# Patient Record
Sex: Male | Born: 1945 | Race: Black or African American | Hispanic: No | Smoking: Former smoker
Health system: Southern US, Community
[De-identification: ages and names within clinical notes are randomized; demographics above are authoritative.]

## PROBLEM LIST (undated history)

## (undated) DIAGNOSIS — J189 Pneumonia, unspecified organism: Secondary | ICD-10-CM

## (undated) DIAGNOSIS — C3491 Malignant neoplasm of unspecified part of right bronchus or lung: Secondary | ICD-10-CM

## (undated) DIAGNOSIS — E785 Hyperlipidemia, unspecified: Secondary | ICD-10-CM

## (undated) DIAGNOSIS — I340 Nonrheumatic mitral (valve) insufficiency: Secondary | ICD-10-CM

## (undated) DIAGNOSIS — H409 Unspecified glaucoma: Secondary | ICD-10-CM

## (undated) DIAGNOSIS — R918 Other nonspecific abnormal finding of lung field: Secondary | ICD-10-CM

## (undated) DIAGNOSIS — Z923 Personal history of irradiation: Secondary | ICD-10-CM

## (undated) DIAGNOSIS — E538 Deficiency of other specified B group vitamins: Secondary | ICD-10-CM

## (undated) DIAGNOSIS — F1010 Alcohol abuse, uncomplicated: Secondary | ICD-10-CM

## (undated) DIAGNOSIS — I739 Peripheral vascular disease, unspecified: Secondary | ICD-10-CM

## (undated) DIAGNOSIS — I2510 Atherosclerotic heart disease of native coronary artery without angina pectoris: Secondary | ICD-10-CM

## (undated) DIAGNOSIS — J449 Chronic obstructive pulmonary disease, unspecified: Secondary | ICD-10-CM

## (undated) DIAGNOSIS — K7460 Unspecified cirrhosis of liver: Secondary | ICD-10-CM

## (undated) DIAGNOSIS — Z87442 Personal history of urinary calculi: Secondary | ICD-10-CM

## (undated) DIAGNOSIS — I10 Essential (primary) hypertension: Secondary | ICD-10-CM

## (undated) DIAGNOSIS — I700 Atherosclerosis of aorta: Secondary | ICD-10-CM

## (undated) DIAGNOSIS — C801 Malignant (primary) neoplasm, unspecified: Secondary | ICD-10-CM

## (undated) DIAGNOSIS — I5189 Other ill-defined heart diseases: Secondary | ICD-10-CM

## (undated) DIAGNOSIS — K219 Gastro-esophageal reflux disease without esophagitis: Secondary | ICD-10-CM

## (undated) HISTORY — DX: Essential (primary) hypertension: I10

## (undated) HISTORY — PX: COLONOSCOPY WITH PROPOFOL: SHX5780

## (undated) HISTORY — DX: Other nonspecific abnormal finding of lung field: R91.8

## (undated) HISTORY — PX: LEG AMPUTATION ABOVE KNEE: SHX117

## (undated) HISTORY — DX: Personal history of urinary calculi: Z87.442

---

## 2005-07-27 ENCOUNTER — Ambulatory Visit: Payer: Self-pay | Admitting: Gastroenterology

## 2010-01-16 ENCOUNTER — Ambulatory Visit: Payer: Self-pay | Admitting: Gastroenterology

## 2010-01-17 LAB — PATHOLOGY REPORT

## 2012-06-26 ENCOUNTER — Ambulatory Visit: Payer: Self-pay | Admitting: Gastroenterology

## 2012-06-26 LAB — CBC WITH DIFFERENTIAL/PLATELET
Basophil #: 0.1 10*3/uL (ref 0.0–0.1)
Basophil %: 1.7 %
HCT: 46 % (ref 40.0–52.0)
HGB: 15.6 g/dL (ref 13.0–18.0)
Lymphocyte #: 1.6 10*3/uL (ref 1.0–3.6)
Lymphocyte %: 27.8 %
MCH: 31.7 pg (ref 26.0–34.0)
MCHC: 34 g/dL (ref 32.0–36.0)
MCV: 93 fL (ref 80–100)
Monocyte %: 8 %
Neutrophil #: 3.3 10*3/uL (ref 1.4–6.5)
Neutrophil %: 56.7 %
Platelet: 165 10*3/uL (ref 150–440)
RBC: 4.94 10*6/uL (ref 4.40–5.90)
RDW: 13.8 % (ref 11.5–14.5)
WBC: 5.7 10*3/uL (ref 3.8–10.6)

## 2012-06-26 LAB — PROTIME-INR: INR: 1

## 2012-06-27 LAB — PATHOLOGY REPORT

## 2012-08-04 ENCOUNTER — Ambulatory Visit: Payer: Self-pay | Admitting: Orthopedic Surgery

## 2012-12-26 LAB — COMPREHENSIVE METABOLIC PANEL
Anion Gap: 5 — ABNORMAL LOW (ref 7–16)
BUN: 13 mg/dL (ref 7–18)
Bilirubin,Total: 0.5 mg/dL (ref 0.2–1.0)
Calcium, Total: 8.9 mg/dL (ref 8.5–10.1)
Co2: 27 mmol/L (ref 21–32)
EGFR (African American): >60
Glucose: 111 mg/dL — ABNORMAL HIGH (ref 65–99)
Osmolality: 271 (ref 275–301)
SGOT(AST): 33 U/L (ref 15–37)
Sodium: 135 mmol/L — ABNORMAL LOW (ref 136–145)
Total Protein: 7.5 g/dL (ref 6.4–8.2)

## 2012-12-26 LAB — URINALYSIS, COMPLETE
Glucose,UR: NEGATIVE mg/dL (ref 0–75)
Hyaline Cast: 3
Leukocyte Esterase: NEGATIVE
Nitrite: NEGATIVE
Ph: 6 (ref 4.5–8.0)
Protein: 30
RBC,UR: 1 /HPF (ref 0–5)
Specific Gravity: 1.004 (ref 1.003–1.030)
Squamous Epithelial: NONE SEEN

## 2012-12-26 LAB — DRUG SCREEN, URINE
Amphetamines, Ur Screen: NEGATIVE (ref ?–1000)
Benzodiazepine, Ur Scrn: NEGATIVE (ref ?–200)
Cannabinoid 50 Ng, Ur ~~LOC~~: NEGATIVE (ref ?–50)
Cocaine Metabolite,Ur ~~LOC~~: NEGATIVE (ref ?–300)
Methadone, Ur Screen: NEGATIVE (ref ?–300)
Opiate, Ur Screen: NEGATIVE (ref ?–300)
Phencyclidine (PCP) Ur S: NEGATIVE (ref ?–25)

## 2012-12-26 LAB — CBC
HCT: 44.7 % (ref 40.0–52.0)
HGB: 15.2 g/dL (ref 13.0–18.0)
MCH: 31.7 pg (ref 26.0–34.0)
MCHC: 34 g/dL (ref 32.0–36.0)
Platelet: 146 10*3/uL — ABNORMAL LOW (ref 150–440)
WBC: 9.4 10*3/uL (ref 3.8–10.6)

## 2012-12-26 LAB — ETHANOL: Ethanol %: 0.276 % — ABNORMAL HIGH (ref 0.000–0.080)

## 2012-12-26 LAB — PROTIME-INR: INR: 0.9

## 2012-12-27 ENCOUNTER — Inpatient Hospital Stay: Payer: Self-pay | Admitting: Psychiatry

## 2012-12-28 LAB — BEHAVIORAL MEDICINE 1 PANEL
Albumin: 3.1 g/dL — ABNORMAL LOW (ref 3.4–5.0)
Alkaline Phosphatase: 91 U/L (ref 50–136)
Anion Gap: 5 — ABNORMAL LOW (ref 7–16)
Basophil #: 0.1 10*3/uL (ref 0.0–0.1)
Bilirubin,Total: 1 mg/dL (ref 0.2–1.0)
Calcium, Total: 8.8 mg/dL (ref 8.5–10.1)
Chloride: 105 mmol/L (ref 98–107)
Creatinine: 1.09 mg/dL (ref 0.60–1.30)
EGFR (Non-African Amer.): >60
Eosinophil #: 0.1 10*3/uL (ref 0.0–0.7)
HGB: 14.4 g/dL (ref 13.0–18.0)
Lymphocyte #: 1 10*3/uL (ref 1.0–3.6)
MCHC: 34.4 g/dL (ref 32.0–36.0)
MCV: 92 fL (ref 80–100)
Monocyte #: 1 x10 3/mm (ref 0.2–1.0)
Osmolality: 272 (ref 275–301)
Potassium: 3.5 mmol/L (ref 3.5–5.1)
RBC: 4.54 10*6/uL (ref 4.40–5.90)
SGOT(AST): 32 U/L (ref 15–37)
WBC: 15.2 10*3/uL — ABNORMAL HIGH (ref 3.8–10.6)

## 2012-12-31 ENCOUNTER — Ambulatory Visit: Payer: Self-pay | Admitting: Psychiatry

## 2013-01-01 ENCOUNTER — Emergency Department: Payer: Self-pay | Admitting: Emergency Medicine

## 2013-01-03 ENCOUNTER — Emergency Department: Payer: Self-pay | Admitting: Emergency Medicine

## 2013-01-05 ENCOUNTER — Emergency Department: Payer: Self-pay | Admitting: Emergency Medicine

## 2013-09-09 DIAGNOSIS — I10 Essential (primary) hypertension: Secondary | ICD-10-CM | POA: Insufficient documentation

## 2014-07-23 NOTE — Consult Note (Signed)
Consult: treatment recommendations Patient was seen as requested by Clinical Education officer, museum. He was seen prior to his discharge and expressed desire for treatment in the [facility] CD-IOP at this inpatient discharge. Intake / admission interview scheduled for 1:PM December 31, 2012. He believes that he could benefit from CD-IOP. Plans are to follow CD-IOP at this inpatient discharge. Oriented with he and his wife as he was leaving this inpatient treatment.   Electronic Signatures: Laqueta Due (PsyD)  (Signed on 29-Sep-14 17:19)  Authored  Last Updated: 29-Sep-14 17:19 by Laqueta Due (PsyD)

## 2014-07-23 NOTE — H&P (Signed)
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  The patient is married for a second time for more than 30 years and lives with his wife, who is 28 years old.  The patient comes for inpatient psychiatry at [facility] with a chief complaint, "I hit my head and I was drunk and I fell down and my wife drove me here to get help."    HISTORY OF PRESENT ILLNESS:  The patient reports that he drinks at a rate of 6 to 7 shots per day and has been drinking at this rate for quite some time, for a long time.    PAST PSYCHIATRIC HISTORY:  No previous history of inpatient psychiatry, no history of suicide attempts, not being followed by any psychiatrist.     F.H  None known for MI . No known H/O for suicides in the family.    FAMILY HISTORY:  Raised by parents who are both deceased.  Father farmed and mother did handy jobs and farmed.  Has 9 siblings, close to family.    PERSONAL HISTORY:  The patient is a poor historian.  He reports that he dropped out in 11th grade had been born around [place].  Longest job he has worked was he did odd jobs and base jobs, currently employed and did wiring for many years.    MILITARY HISTORY:  None.  MARRIAGES:  Is married twice.  This is the second marriage.  Has 2 children.  He did not say if he was close to the children.    ALCOHOL AND DRUGS:  Has been drinking alcohol for many years.  Had 2 DWIs, did not lose his license.  He still has his driver's license.  Never arrested for public drunkenness.  Denies street or prescription drug abuse.  Does admit smoking nicotine cigarettes as much as he can get.    MEDICAL HISTORY:  Has hypertension.  No known history of diabetes mellitus.  No major surgeries, no major injuries.  No history of motor vehicle accidents and admits that  he does not have any regular doctor.    PHYSICAL EXAMINATION VITAL SIGNS:  Temperature 98.5, pulse 82 per minute regular, respirations 18 per minute regular, blood pressure is 140/77 mmHg. HEENT:  Head is normocephalic, atraumatic.  Mouth edentulous and lost many teeth.   NECK:  Supple.   CHEST:  Normal expansion, normal breath sounds heard. HEART:  Normal S1 and S2 heard.   ABDOMEN:  Soft, no organomegaly, bowel sounds heard. RECTAL:  Deferred.   NEUROLOGICAL:  Normal gait.  Cranial nerves II through XII grossly intact.    MENTAL STATUS EXAMINATION:  The patient is dressed in street clothes, disheveled with very poor grooming and dirty cloths without stains.  He appears to be drowsy, coming out of alcohol; however, he was cheerful and smiling with fairly okay eye contact.  He knew he was in a hospital.  He did not know capital of the state.  He did not know capital of this country and he said he did not keep up with all those things.  He knew the name of the president with prompting and help.  Denies feeling depressed, denies feeling hopeless or helpless.  Denies feeling worthless or useless.  Memory and recall are poor.  Attention and  focus are poor.  He was sleepy, however, he could wake up during the interview.  He realizes that he drinks alcohol and he has been drinking for quite some time.  Cognition is very limited and he knew he was in a hospital and he said this was October 2014 which was not correct.  No psychosis.  Denies auditory or visual hallucinations.  Denies any delusional or paranoid thinking.  Denies any ideas or plans to hurt himself or others.  Insight and judgment are guarded.  IMPRESSION AXIS I:  Alcohol dependence, chronic, continuous for many years.  Substance abuse, mood disorder, nicotine dependence. AXIS II:  Deferred.   AXIS III:  Edentulous, hypertension.   AXIS IV:  Severe.  Long history of alcohol dependence with very poor insight into his drinking problem. AXIS V:   GAF 25.    PLAN:  The patient is admitted to [facility] for close observation and manage.  Will start on CIWA protocol.  He will be started on antidepressants such as Effexor at 37.5 which will be adjusted by the floor physician.  During the stay in the hospital, he will be given milieu therapy and supportive counseling and he will be addressed about his substance abuse problems with which he has very limited insight.  At the time of discharge, he will be stabilized and appropriate followup appointment made in the community.    ____________________________ Wallace Cullens. Franchot Mimes, MD skc:cs D: 12/28/2012 15:08:44 ET T: 12/28/2012 15:29:11 ET JOB#: [id number]  cc: Arlyn Leak K. Franchot Mimes, MD, <Dictator> Dewain Penning MD ELECTRONICALLY SIGNED 12/29/2012 9:51

## 2016-07-24 ENCOUNTER — Other Ambulatory Visit: Payer: Self-pay | Admitting: Gastroenterology

## 2016-07-24 DIAGNOSIS — F1029 Alcohol dependence with unspecified alcohol-induced disorder: Secondary | ICD-10-CM

## 2016-07-24 DIAGNOSIS — D696 Thrombocytopenia, unspecified: Secondary | ICD-10-CM

## 2016-08-07 ENCOUNTER — Ambulatory Visit
Admission: RE | Admit: 2016-08-07 | Discharge: 2016-08-07 | Disposition: A | Discharge status: Home / Self Care | Payer: Medicare Other | Source: Ambulatory Visit | Attending: Gastroenterology | Admitting: Gastroenterology

## 2016-08-07 DIAGNOSIS — F1029 Alcohol dependence with unspecified alcohol-induced disorder: Secondary | ICD-10-CM | POA: Diagnosis not present

## 2016-08-07 DIAGNOSIS — D696 Thrombocytopenia, unspecified: Secondary | ICD-10-CM | POA: Diagnosis present

## 2016-08-07 DIAGNOSIS — K828 Other specified diseases of gallbladder: Secondary | ICD-10-CM | POA: Insufficient documentation

## 2016-08-09 ENCOUNTER — Other Ambulatory Visit: Payer: Self-pay | Admitting: Gastroenterology

## 2016-08-09 DIAGNOSIS — R935 Abnormal findings on diagnostic imaging of other abdominal regions, including retroperitoneum: Secondary | ICD-10-CM

## 2016-08-09 DIAGNOSIS — D696 Thrombocytopenia, unspecified: Secondary | ICD-10-CM

## 2016-08-21 ENCOUNTER — Ambulatory Visit
Admission: RE | Admit: 2016-08-21 | Discharge: 2016-08-21 | Disposition: A | Discharge status: Home / Self Care | Payer: Medicare Other | Source: Ambulatory Visit | Attending: Gastroenterology | Admitting: Gastroenterology

## 2016-08-21 ENCOUNTER — Other Ambulatory Visit: Payer: Self-pay | Admitting: Gastroenterology

## 2016-08-21 DIAGNOSIS — K7689 Other specified diseases of liver: Secondary | ICD-10-CM | POA: Diagnosis not present

## 2016-08-21 DIAGNOSIS — R935 Abnormal findings on diagnostic imaging of other abdominal regions, including retroperitoneum: Secondary | ICD-10-CM

## 2016-08-21 DIAGNOSIS — N281 Cyst of kidney, acquired: Secondary | ICD-10-CM | POA: Diagnosis not present

## 2016-08-21 DIAGNOSIS — I77811 Abdominal aortic ectasia: Secondary | ICD-10-CM | POA: Insufficient documentation

## 2016-08-21 DIAGNOSIS — D696 Thrombocytopenia, unspecified: Secondary | ICD-10-CM | POA: Diagnosis not present

## 2016-08-21 DIAGNOSIS — I700 Atherosclerosis of aorta: Secondary | ICD-10-CM | POA: Insufficient documentation

## 2016-08-21 DIAGNOSIS — N289 Disorder of kidney and ureter, unspecified: Secondary | ICD-10-CM | POA: Diagnosis not present

## 2016-08-21 MED ORDER — GADOBENATE DIMEGLUMINE 529 MG/ML IV SOLN
15.0000 mL | Freq: Once | INTRAVENOUS | Status: AC | PRN
  Administered 2016-08-21: 15 mL via INTRAVENOUS

## 2016-12-10 ENCOUNTER — Encounter: Admission: RE | Payer: Self-pay | Source: Ambulatory Visit

## 2016-12-10 ENCOUNTER — Ambulatory Visit: Admission: RE | Admit: 2016-12-10 | Payer: Medicare Other | Source: Ambulatory Visit | Admitting: Gastroenterology

## 2016-12-10 SURGERY — COLONOSCOPY WITH PROPOFOL
Anesthesia: General

## 2017-04-02 DIAGNOSIS — C3491 Malignant neoplasm of unspecified part of right bronchus or lung: Secondary | ICD-10-CM

## 2017-04-02 HISTORY — DX: Malignant neoplasm of unspecified part of right bronchus or lung: C34.91

## 2017-08-14 DIAGNOSIS — Z720 Tobacco use: Secondary | ICD-10-CM | POA: Insufficient documentation

## 2017-08-15 ENCOUNTER — Telehealth: Payer: Self-pay | Admitting: *Deleted

## 2017-08-15 DIAGNOSIS — Z87891 Personal history of nicotine dependence: Secondary | ICD-10-CM

## 2017-08-15 DIAGNOSIS — Z122 Encounter for screening for malignant neoplasm of respiratory organs: Secondary | ICD-10-CM

## 2017-08-15 NOTE — Telephone Encounter (Signed)
Received referral for initial lung cancer screening scan. Contacted patient and obtained smoking history,(current, 54 pack year) as well as answering questions related to screening process. Patient denies signs of lung cancer such as weight loss or hemoptysis. Patient denies comorbidity that would prevent curative treatment if lung cancer were found. Patient is scheduled for shared decision making visit and CT scan on 09/03/17.

## 2017-08-15 NOTE — Telephone Encounter (Signed)
Received referral for low dose lung cancer screening CT scan. Message left at phone number listed in EMR for patient to call me back to facilitate scheduling scan.  

## 2017-09-02 ENCOUNTER — Encounter: Payer: Self-pay | Admitting: Nurse Practitioner

## 2017-09-03 ENCOUNTER — Inpatient Hospital Stay: Payer: Medicare Other | Attending: Nurse Practitioner | Admitting: Nurse Practitioner

## 2017-09-03 ENCOUNTER — Ambulatory Visit
Admission: RE | Admit: 2017-09-03 | Discharge: 2017-09-03 | Disposition: A | Discharge status: Home / Self Care | Payer: Medicare Other | Source: Ambulatory Visit | Attending: Nurse Practitioner | Admitting: Nurse Practitioner

## 2017-09-03 ENCOUNTER — Telehealth: Payer: Self-pay | Admitting: *Deleted

## 2017-09-03 DIAGNOSIS — Z7289 Other problems related to lifestyle: Secondary | ICD-10-CM | POA: Insufficient documentation

## 2017-09-03 DIAGNOSIS — Z7982 Long term (current) use of aspirin: Secondary | ICD-10-CM | POA: Insufficient documentation

## 2017-09-03 DIAGNOSIS — J984 Other disorders of lung: Secondary | ICD-10-CM | POA: Insufficient documentation

## 2017-09-03 DIAGNOSIS — J439 Emphysema, unspecified: Secondary | ICD-10-CM | POA: Diagnosis not present

## 2017-09-03 DIAGNOSIS — R911 Solitary pulmonary nodule: Secondary | ICD-10-CM | POA: Insufficient documentation

## 2017-09-03 DIAGNOSIS — Z79899 Other long term (current) drug therapy: Secondary | ICD-10-CM | POA: Insufficient documentation

## 2017-09-03 DIAGNOSIS — Z87891 Personal history of nicotine dependence: Secondary | ICD-10-CM

## 2017-09-03 DIAGNOSIS — F17210 Nicotine dependence, cigarettes, uncomplicated: Secondary | ICD-10-CM | POA: Insufficient documentation

## 2017-09-03 DIAGNOSIS — I700 Atherosclerosis of aorta: Secondary | ICD-10-CM | POA: Insufficient documentation

## 2017-09-03 DIAGNOSIS — Z122 Encounter for screening for malignant neoplasm of respiratory organs: Secondary | ICD-10-CM

## 2017-09-03 DIAGNOSIS — Z87442 Personal history of urinary calculi: Secondary | ICD-10-CM | POA: Insufficient documentation

## 2017-09-03 NOTE — Progress Notes (Signed)
In accordance with CMS guidelines, patient has met eligibility criteria including age, absence of signs or symptoms of lung cancer.  Social History   Tobacco Use  . Smoking status: Current Every Day Smoker    Packs/day: 1.00    Years: 54.00    Pack years: 54.00    Types: Cigarettes  Substance Use Topics  . Alcohol use: Not on file  . Drug use: Not on file      A shared decision-making session was conducted prior to the performance of CT scan. This includes one or more decision aids, includes benefits and harms of screening, follow-up diagnostic testing, over-diagnosis, false positive rate, and total radiation exposure.   Counseling on the importance of adherence to annual lung cancer LDCT screening, impact of co-morbidities, and ability or willingness to undergo diagnosis and treatment is imperative for compliance of the program.   Counseling on the importance of continued smoking cessation for former smokers; the importance of smoking cessation for current smokers, and information about tobacco cessation interventions have been given to patient including [place] and 1800 quit [place] programs.   Written order for lung cancer screening with LDCT has been given to the patient and any and all questions have been answered to the best of my abilities.    Yearly follow up will be coordinated by Burgess Estelle, Thoracic Navigator.  Beckey Rutter, DNP, AGNP-C [place] at [facility] [phone] (work cell) [phone] (office) 09/03/17 10:01 AM

## 2017-09-03 NOTE — Telephone Encounter (Signed)
Called report  IMPRESSION: 1. Spiculated multi lobular 2.4 cm right upper lobe pulmonary lesion consistent with primary bronchogenic neoplasm. Lung-RADS 4B, suspicious. Additional imaging evaluation or consultation with Pulmonology or Thoracic Surgery recommended.

## 2017-09-04 ENCOUNTER — Telehealth: Payer: Self-pay | Admitting: *Deleted

## 2017-09-04 ENCOUNTER — Encounter: Payer: Self-pay | Admitting: *Deleted

## 2017-09-04 DIAGNOSIS — R911 Solitary pulmonary nodule: Secondary | ICD-10-CM

## 2017-09-04 NOTE — Telephone Encounter (Signed)
After discussion with thoracic navigator, Beckey Rutter NP, and Dr. Reuel Boom office, patient notified of lung screening scan results including incidental findings. Discussed recommended plan for evaluation of RUL nodule including PET, PFT's, and to see medical oncology for results and planning if needed. Verbalizes understanding.

## 2017-09-06 NOTE — Progress Notes (Signed)
  Oncology Nurse Navigator Documentation  Navigator Location: [facility]-Med Onc (09/05/17 1300) Referral date to RadOnc/MedOnc: 09/03/17 (09/05/17 1300) )Navigator Encounter Type: Introductory phone call (09/05/17 1300)   Abnormal Finding Date: 09/03/17 (09/05/17 1300)                     Barriers/Navigation Needs: Coordination of Care (09/05/17 1300)   Interventions: Coordination of Care (09/05/17 1300)   Coordination of Care: Appts;Radiology (09/05/17 1300)        Acuity: Level 2 (09/05/17 1300)   Acuity Level 2: Initial guidance, education and coordination as needed;Educational needs;Assistance expediting appointments (09/05/17 1300)    phone call made to patient to review upcoming appts scheduled for next week. Pt introduced to navigator services. Informed of PFT and PET scan appts on Tues and Wed of next week. Also informed that will follow up with Dr. Grayland Ormond on Monday 6/17 to review results of both studies. Pt stated that he will try to make it to those appts. Pt encouraged to keep appts to further evaluate abnormal findings from screening CT scan. Pt informed that appts have been mailed to him as well. Pt verbalized understanding. Will make follow up phone call to patient on Monday. Nothing further needed at this time. Time Spent with Patient: 30 (09/05/17 1300)

## 2017-09-10 ENCOUNTER — Ambulatory Visit: Payer: Medicare Other | Attending: Nurse Practitioner

## 2017-09-10 DIAGNOSIS — R911 Solitary pulmonary nodule: Secondary | ICD-10-CM | POA: Diagnosis present

## 2017-09-10 DIAGNOSIS — J449 Chronic obstructive pulmonary disease, unspecified: Secondary | ICD-10-CM | POA: Diagnosis not present

## 2017-09-10 MED ORDER — ALBUTEROL SULFATE (2.5 MG/3ML) 0.083% IN NEBU
2.5000 mg | INHALATION_SOLUTION | Freq: Once | RESPIRATORY_TRACT | Status: AC
  Administered 2017-09-10: 2.5 mg via RESPIRATORY_TRACT
  Filled 2017-09-10: qty 3

## 2017-09-11 ENCOUNTER — Ambulatory Visit
Admission: RE | Admit: 2017-09-11 | Discharge: 2017-09-11 | Disposition: A | Discharge status: Home / Self Care | Payer: Medicare Other | Source: Ambulatory Visit | Attending: Nurse Practitioner | Admitting: Nurse Practitioner

## 2017-09-11 ENCOUNTER — Telehealth: Payer: Self-pay | Admitting: Nurse Practitioner

## 2017-09-11 ENCOUNTER — Other Ambulatory Visit: Payer: Self-pay | Admitting: *Deleted

## 2017-09-11 DIAGNOSIS — Z79899 Other long term (current) drug therapy: Secondary | ICD-10-CM | POA: Insufficient documentation

## 2017-09-11 DIAGNOSIS — I700 Atherosclerosis of aorta: Secondary | ICD-10-CM | POA: Diagnosis not present

## 2017-09-11 DIAGNOSIS — J984 Other disorders of lung: Secondary | ICD-10-CM | POA: Diagnosis not present

## 2017-09-11 DIAGNOSIS — R911 Solitary pulmonary nodule: Secondary | ICD-10-CM | POA: Diagnosis present

## 2017-09-11 DIAGNOSIS — I714 Abdominal aortic aneurysm, without rupture: Secondary | ICD-10-CM | POA: Insufficient documentation

## 2017-09-11 DIAGNOSIS — N200 Calculus of kidney: Secondary | ICD-10-CM | POA: Diagnosis not present

## 2017-09-11 DIAGNOSIS — N400 Enlarged prostate without lower urinary tract symptoms: Secondary | ICD-10-CM | POA: Diagnosis not present

## 2017-09-11 LAB — GLUCOSE, CAPILLARY: GLUCOSE-CAPILLARY: 94 mg/dL (ref 65–99)

## 2017-09-11 MED ORDER — FLUDEOXYGLUCOSE F - 18 (FDG) INJECTION
9.4200 | Freq: Once | INTRAVENOUS | Status: AC | PRN
Start: 2017-09-11 — End: 2017-09-11
  Administered 2017-09-11: 9.42 via INTRAVENOUS

## 2017-09-11 NOTE — Telephone Encounter (Signed)
Patient was recently seen in [facility]. Results revealed a spiculated multi-lobular 2.4 cm RUL lung nodule consistent and concerning for primary bronchogenic neoplasm. Case discussed at [facility] and consensus recommendation was to proceed with PFTs and PET scan for further evaluation to aid in treatment planning.

## 2017-09-14 LAB — BLOOD GAS, ARTERIAL
Acid-base deficit: 1.6 mmol/L (ref 0.0–2.0)
BICARBONATE: 21.7 mmol/L (ref 20.0–28.0)
FIO2: 0.21
O2 SAT: 97.2 %
PO2 ART: 89 mmHg (ref 83.0–108.0)
Patient temperature: 37
pCO2 arterial: 32 mmHg (ref 32.0–48.0)
pH, Arterial: 7.44 (ref 7.350–7.450)

## 2017-09-15 DIAGNOSIS — R911 Solitary pulmonary nodule: Secondary | ICD-10-CM | POA: Insufficient documentation

## 2017-09-15 NOTE — Progress Notes (Deleted)
[place]  [phone] [phone]  ID: Samuel Carney OB: Jan 02, 1946  MR#: [id number]  CSN#:[id number]  Patient Care Team: Juluis Pitch, MD as PCP - General (Family Medicine) Telford Nab, RN as Registered Nurse  CHIEF COMPLAINT: Right upper lobe lung nodule  INTERVAL HISTORY: ***  REVIEW OF SYSTEMS:   ROS  As per HPI. Otherwise, a complete review of systems is negative.  PAST MEDICAL HISTORY: No past medical history on file.  PAST SURGICAL HISTORY: *** The histories are not reviewed yet. Please review them in the "History" navigator section and refresh this [place].  FAMILY HISTORY: No family history on file.  ADVANCED DIRECTIVES (Y/N):  N  HEALTH MAINTENANCE: Social History   Tobacco Use  . Smoking status: Current Every Day Smoker    Packs/day: 1.00    Years: 54.00    Pack years: 54.00    Types: Cigarettes  Substance Use Topics  . Alcohol use: Not on file  . Drug use: Not on file     Colonoscopy:  PAP:  Bone density:  Lipid panel:  Not on File  No current outpatient medications on file.   No current facility-administered medications for this visit.     OBJECTIVE: There were no vitals filed for this visit.   There is no height or weight on file to calculate BMI.    ECOG FS:{CHL ONC [id number]  General: Well-developed, well-nourished, no acute distress. Eyes: Pink conjunctiva, anicteric sclera. HEENT: Normocephalic, moist mucous membranes, clear oropharnyx. Lungs: Clear to auscultation bilaterally. Heart: Regular rate and rhythm. No rubs, murmurs, or gallops. Abdomen: Soft, nontender, nondistended. No organomegaly noted, normoactive bowel sounds. Musculoskeletal: No edema, cyanosis, or clubbing. Neuro: Alert, answering all questions appropriately. Cranial nerves grossly intact. Skin: No rashes or petechiae noted. Psych: Normal affect. Lymphatics: No cervical, calvicular, axillary or inguinal  LAD.   LAB RESULTS:  Lab Results  Component Value Date   NA 136 12/28/2012   K 3.5 12/28/2012   CL 105 12/28/2012   CO2 26 12/28/2012   GLUCOSE 96 12/28/2012   BUN 14 12/28/2012   CREATININE 1.09 12/28/2012   CALCIUM 8.8 12/28/2012   PROT 6.9 12/28/2012   ALBUMIN 3.1 (L) 12/28/2012   AST 32 12/28/2012   ALT 16 12/28/2012   ALKPHOS 91 12/28/2012   BILITOT 1.0 12/28/2012   GFRNONAA >60 12/28/2012   GFRAA >60 12/28/2012    Lab Results  Component Value Date   WBC 15.2 (H) 12/28/2012   NEUTROABS 12.9 (H) 12/28/2012   HGB 14.4 12/28/2012   HCT 41.7 12/28/2012   MCV 92 12/28/2012   PLT 130 (L) 12/28/2012     STUDIES: Nm Pet Image Initial (pi) Skull Base To Thigh  Result Date: 09/11/2017 CLINICAL DATA:  Initial treatment strategy for pulmonary nodule. EXAM: NUCLEAR MEDICINE PET SKULL BASE TO THIGH TECHNIQUE: 9.4 mCi F-18 FDG was injected intravenously. Full-ring PET imaging was performed from the skull base to thigh after the radiotracer. CT data was obtained and used for attenuation correction and anatomic localization. Fasting blood glucose: 94 mg/dl COMPARISON:  Lung cancer screening CT 09/03/2017. FINDINGS: Mediastinal blood pool activity: SUV max 2.0 NECK: No hypermetabolic lymph nodes in the neck. Incidental CT findings: none CHEST: 2.4 cm lesion in the right upper lobe seen on recent screening CT is hypermetabolic with SUV max = 5.8. No evidence for hypermetabolic metastatic lymphadenopathy in the right hilum and mediastinum. No other hypermetabolic disease is identified in the chest. Incidental CT findings: There is  abdominal aortic atherosclerosis without aneurysm. Coronary artery calcification is evident. Bilateral gynecomastia, right greater than left. ABDOMEN/PELVIS: No abnormal hypermetabolic activity within the liver, pancreas, adrenal glands, or spleen. No hypermetabolic lymph nodes in the abdomen or pelvis. Incidental CT findings: Fusiform aneurysmal dilatation of the  abdominal aorta identified with maximum diameter of 3.1 cm. 7 x 10 mm nonobstructing stone evident in the lower pole the right kidney. Adjacent 4 mm right lower pole renal stone evident. Mild diverticular changes noted in the left colon. The prostate gland is enlarged. SKELETON: No focal hypermetabolic activity to suggest skeletal metastasis. Incidental CT findings: none IMPRESSION: 1. 2.4 cm right upper lobe lesion is hypermetabolic consistent with primary bronchogenic neoplasm. No evidence for hypermetabolic metastatic disease in the neck, chest, abdomen, or pelvis. 2.  Aortic Atherosclerois (ICD10-170.0) 3. Abdominal aortic aneurysm measures 3.1 cm diameter. Recommend followup by ultrasound in 3 years. This recommendation follows ACR consensus guidelines: White Paper of the ACR Incidental Findings Committee II on Vascular Findings. J Am Coll Radiol 2013; 65:790-383 4. Nonobstructing right renal stones. 5. Prostatomegaly. Electronically Signed   By: Misty Stanley M.D.   On: 09/11/2017 13:13   Ct Chest Lung Cancer Screening Low Dose Wo Contrast  Result Date: 09/03/2017 CLINICAL DATA:  72 year old male with 54 pack-year history of smoking. Lung cancer screening. EXAM: CT CHEST WITHOUT CONTRAST LOW-DOSE FOR LUNG CANCER SCREENING TECHNIQUE: Multidetector CT imaging of the chest was performed following the standard protocol without IV contrast. COMPARISON:  None. FINDINGS: Cardiovascular: The heart size is normal. No substantial pericardial effusion. Coronary artery calcification is evident. Atherosclerotic calcification is noted in the wall of the thoracic aorta. Mediastinum/Nodes: Scattered mediastinal lymph nodes are upper limits of normal for size. No evidence for gross hilar lymphadenopathy although assessment is limited by the lack of intravenous contrast on today's study. Mild circumferential wall thickening noted mid and distal esophagus. There is no axillary lymphadenopathy. Lungs/Pleura: Spiculated multi  lobular right upper lobe nodule shows tethering to the overlying pleura. Volume derived equivalent diameter of this lesion is 2.4 cm. The central tracheobronchial airways are patent. Centrilobular emphysema noted. Subtle centrilobular ground-glass nodularity with an upper lobe predominance is consistent with smoking related lung disease. Upper Abdomen: Small hypoattenuating lesions in the liver cannot be fully characterized and measure up to 10 mm. Thickening of the left adrenal gland evident. Musculoskeletal: Multilevel degenerative disc disease is evident in the thoracic spine. Bilateral gynecomastia evident. IMPRESSION: 1. Spiculated multi lobular 2.4 cm right upper lobe pulmonary lesion consistent with primary bronchogenic neoplasm. Lung-RADS 4B, suspicious. Additional imaging evaluation or consultation with Pulmonology or Thoracic Surgery recommended. 2. No definite mediastinal or hilar lymphadenopathy on this noncontrast study. 3.  Emphysema. (ICD10-J43.9) 4.  Aortic Atherosclerois (ICD10-170.0) 5. These results will be called to the ordering clinician or representative by the Radiologist Assistant, and communication documented in the PACS or zVision Dashboard. Electronically Signed   By: Misty Stanley M.D.   On: 09/03/2017 11:29    ASSESSMENT: Right upper lobe lung nodule  PLAN:    1. Right upper lobe lung nodule:  Patient expressed understanding and was in agreement with this plan. He also understands that He can call clinic at any time with any questions, concerns, or complaints.   Cancer Staging No matching staging information was found for the patient.  Lloyd Huger, MD   09/15/2017 10:49 PM

## 2017-09-16 ENCOUNTER — Inpatient Hospital Stay: Payer: Medicare Other | Admitting: Oncology

## 2017-09-18 ENCOUNTER — Other Ambulatory Visit: Payer: Self-pay

## 2017-09-18 ENCOUNTER — Inpatient Hospital Stay (HOSPITAL_BASED_OUTPATIENT_CLINIC_OR_DEPARTMENT_OTHER): Payer: Medicare Other | Admitting: Oncology

## 2017-09-18 ENCOUNTER — Encounter: Payer: Self-pay | Admitting: Oncology

## 2017-09-18 ENCOUNTER — Encounter: Payer: Self-pay | Admitting: *Deleted

## 2017-09-18 VITALS — BP 142/75 | HR 57 | Temp 97.9°F | Resp 20 | Ht 69.0 in | Wt 173.0 lb

## 2017-09-18 DIAGNOSIS — R911 Solitary pulmonary nodule: Secondary | ICD-10-CM

## 2017-09-18 DIAGNOSIS — Z87442 Personal history of urinary calculi: Secondary | ICD-10-CM | POA: Diagnosis not present

## 2017-09-18 DIAGNOSIS — Z7289 Other problems related to lifestyle: Secondary | ICD-10-CM

## 2017-09-18 DIAGNOSIS — Z7982 Long term (current) use of aspirin: Secondary | ICD-10-CM | POA: Diagnosis not present

## 2017-09-18 DIAGNOSIS — Z79899 Other long term (current) drug therapy: Secondary | ICD-10-CM | POA: Diagnosis not present

## 2017-09-18 DIAGNOSIS — F17210 Nicotine dependence, cigarettes, uncomplicated: Secondary | ICD-10-CM | POA: Diagnosis not present

## 2017-09-18 NOTE — Progress Notes (Signed)
  Oncology Nurse Navigator Documentation  Navigator Location: [facility]-Med Onc (09/18/17 1500)   )Navigator Encounter Type: Initial MedOnc (09/18/17 1500)                       Treatment Phase: Abnormal Scans (09/18/17 1500) Barriers/Navigation Needs: Coordination of Care (09/18/17 1500)   Interventions: Coordination of Care (09/18/17 1500)   Coordination of Care: Appts (09/18/17 1500)     met with patient during initial med-onc consultation with Dr. Grayland Ormond. Recent imaging and PFT's reviewed with patient by Dr. Grayland Ormond. Treatment options discussed and recommendation was for patient to discuss surgical approach with Dr. Genevive Bi then decide if wanted to pursue surgery or radiation. All questions answered at the time of visit. Referral entered and pt informed that will here from Dr. Genevive Bi office when appt has been scheduled. Contact info give and instructed to call with any further questions or needs. Pt verbalized understanding.              Time Spent with Patient: 60 (09/18/17 1500)

## 2017-09-22 NOTE — Progress Notes (Signed)
[facility] [facility]  [phone] [phone]) [phone]  ID: Samuel Carney OB: November 09, 1945  MR#: [id number]  PIR#:[id number]  Patient Care Team: Juluis Pitch, MD as PCP - General (Family Medicine) Telford Nab, RN as Registered Nurse  CHIEF COMPLAINT: Right upper lobe lung nodule.  INTERVAL HISTORY: Patient is a 72 year old male who underwent CT screening and was found to have a right upper lobe lung nodule highly suspicious for underlying malignancy.  He currently feels well and is asymptomatic.  He has no neurologic complaints.  Patient denies any recent fevers or illnesses.  He has a good appetite and denies weight loss.  He has no chest pain, cough, shortness of breath, or hemoptysis.  He denies any nausea, vomiting, constipation, or diarrhea.  He has no urinary complaints.  Patient feels at his baseline offers no specific complaints today.  REVIEW OF SYSTEMS:   Review of Systems  Constitutional: Negative.  Negative for fever, malaise/fatigue and weight loss.  Respiratory: Negative.  Negative for cough, hemoptysis and shortness of breath.   Cardiovascular: Negative.  Negative for chest pain and leg swelling.  Gastrointestinal: Negative.  Negative for abdominal pain.  Genitourinary: Negative.  Negative for dysuria.  Musculoskeletal: Negative.  Negative for back pain.  Skin: Negative.  Negative for rash.  Neurological: Negative.  Negative for sensory change, focal weakness and weakness.  Psychiatric/Behavioral: Negative.  The patient is not nervous/anxious.     As per HPI. Otherwise, a complete review of systems is negative.  PAST MEDICAL HISTORY: Past Medical History:  Diagnosis Date  . History of kidney stones   . Hypertension   . Lung mass     PAST SURGICAL HISTORY: History reviewed. No pertinent surgical history.  FAMILY HISTORY: Family History  Problem Relation Age of Onset  . Stroke Brother   . Diabetes Mellitus II Brother   . Diabetes  Mellitus II Mother   . Diabetes Mellitus II Maternal Uncle     ADVANCED DIRECTIVES (Y/N):  N  HEALTH MAINTENANCE: Social History   Tobacco Use  . Smoking status: Current Every Day Smoker    Packs/day: 1.00    Years: 50.00    Pack years: 50.00    Types: Cigarettes  . Smokeless tobacco: Never Used  Substance Use Topics  . Alcohol use: Yes    Comment: "1 pint of liquor per day"  . Drug use: Never     Colonoscopy:  PAP:  Bone density:  Lipid panel:  No Known Allergies  Current Outpatient Medications  Medication Sig Dispense Refill  . amLODipine (NORVASC) 10 MG tablet Take 1 tablet by mouth daily.    Marland Kitchen aspirin EC 81 MG tablet Take 1 tablet by mouth daily.    . dorzolamide-timolol (COSOPT) 22.3-6.8 MG/ML ophthalmic solution Place 1 drop into both eyes every 8 (eight) hours.  6  . doxazosin (CARDURA) 2 MG tablet Take 2 mg by mouth daily.  1  . metoprolol tartrate (LOPRESSOR) 100 MG tablet Take 1 tablet by mouth 2 (two) times daily.    . potassium chloride SA (KLOR-CON M20) 20 MEQ tablet Take 1 tablet by mouth daily.    Marland Kitchen telmisartan (MICARDIS) 80 MG tablet Take 1 tablet by mouth daily.    . traMADol (ULTRAM) 50 MG tablet Take 1 tablet by mouth every 4 (four) hours as needed for pain.    . Travoprost, BAK Free, (TRAVATAN Z) 0.004 % SOLN ophthalmic solution once daily.     No current facility-administered medications for this visit.  OBJECTIVE: Vitals:   09/18/17 1146  BP: (!) 142/75  Pulse: (!) 57  Resp: 20  Temp: 97.9 F (36.6 C)  SpO2: 98%     Body mass index is 25.55 kg/m.    ECOG FS:0 - Asymptomatic  General: Well-developed, well-nourished, no acute distress. Eyes: Pink conjunctiva, anicteric sclera. HEENT: Normocephalic, moist mucous membranes, clear oropharnyx. Lungs: Clear to auscultation bilaterally. Heart: Regular rate and rhythm. No rubs, murmurs, or gallops. Abdomen: Soft, nontender, nondistended. No organomegaly noted, normoactive bowel  sounds. Musculoskeletal: No edema, cyanosis, or clubbing. Neuro: Alert, answering all questions appropriately. Cranial nerves grossly intact. Skin: No rashes or petechiae noted. Psych: Normal affect. Lymphatics: No cervical, calvicular, axillary or inguinal LAD.   LAB RESULTS:  Lab Results  Component Value Date   NA 136 12/28/2012   K 3.5 12/28/2012   CL 105 12/28/2012   CO2 26 12/28/2012   GLUCOSE 96 12/28/2012   BUN 14 12/28/2012   CREATININE 1.09 12/28/2012   CALCIUM 8.8 12/28/2012   PROT 6.9 12/28/2012   ALBUMIN 3.1 (L) 12/28/2012   AST 32 12/28/2012   ALT 16 12/28/2012   ALKPHOS 91 12/28/2012   BILITOT 1.0 12/28/2012   GFRNONAA >60 12/28/2012   GFRAA >60 12/28/2012    Lab Results  Component Value Date   WBC 15.2 (H) 12/28/2012   NEUTROABS 12.9 (H) 12/28/2012   HGB 14.4 12/28/2012   HCT 41.7 12/28/2012   MCV 92 12/28/2012   PLT 130 (L) 12/28/2012     STUDIES: Nm Pet Image Initial (pi) Skull Base To Thigh  Result Date: 09/11/2017 CLINICAL DATA:  Initial treatment strategy for pulmonary nodule. EXAM: NUCLEAR MEDICINE PET SKULL BASE TO THIGH TECHNIQUE: 9.4 mCi F-18 FDG was injected intravenously. Full-ring PET imaging was performed from the skull base to thigh after the radiotracer. CT data was obtained and used for attenuation correction and anatomic localization. Fasting blood glucose: 94 mg/dl COMPARISON:  Lung cancer screening CT 09/03/2017. FINDINGS: Mediastinal blood pool activity: SUV max 2.0 NECK: No hypermetabolic lymph nodes in the neck. Incidental CT findings: none CHEST: 2.4 cm lesion in the right upper lobe seen on recent screening CT is hypermetabolic with SUV max = 5.8. No evidence for hypermetabolic metastatic lymphadenopathy in the right hilum and mediastinum. No other hypermetabolic disease is identified in the chest. Incidental CT findings: There is abdominal aortic atherosclerosis without aneurysm. Coronary artery calcification is evident. Bilateral  gynecomastia, right greater than left. ABDOMEN/PELVIS: No abnormal hypermetabolic activity within the liver, pancreas, adrenal glands, or spleen. No hypermetabolic lymph nodes in the abdomen or pelvis. Incidental CT findings: Fusiform aneurysmal dilatation of the abdominal aorta identified with maximum diameter of 3.1 cm. 7 x 10 mm nonobstructing stone evident in the lower pole the right kidney. Adjacent 4 mm right lower pole renal stone evident. Mild diverticular changes noted in the left colon. The prostate gland is enlarged. SKELETON: No focal hypermetabolic activity to suggest skeletal metastasis. Incidental CT findings: none IMPRESSION: 1. 2.4 cm right upper lobe lesion is hypermetabolic consistent with primary bronchogenic neoplasm. No evidence for hypermetabolic metastatic disease in the neck, chest, abdomen, or pelvis. 2.  Aortic Atherosclerois (ICD10-170.0) 3. Abdominal aortic aneurysm measures 3.1 cm diameter. Recommend followup by ultrasound in 3 years. This recommendation follows ACR consensus guidelines: White Paper of the ACR Incidental Findings Committee II on Vascular Findings. J Am Coll Radiol 2013; 33:545-625 4. Nonobstructing right renal stones. 5. Prostatomegaly. Electronically Signed   By: Misty Stanley M.D.   On: 09/11/2017  13:13   Ct Chest Lung Cancer Screening Low Dose Wo Contrast  Result Date: 09/03/2017 CLINICAL DATA:  72 year old male with 54 pack-year history of smoking. Lung cancer screening. EXAM: CT CHEST WITHOUT CONTRAST LOW-DOSE FOR LUNG CANCER SCREENING TECHNIQUE: Multidetector CT imaging of the chest was performed following the standard protocol without IV contrast. COMPARISON:  None. FINDINGS: Cardiovascular: The heart size is normal. No substantial pericardial effusion. Coronary artery calcification is evident. Atherosclerotic calcification is noted in the wall of the thoracic aorta. Mediastinum/Nodes: Scattered mediastinal lymph nodes are upper limits of normal for size. No  evidence for gross hilar lymphadenopathy although assessment is limited by the lack of intravenous contrast on today's study. Mild circumferential wall thickening noted mid and distal esophagus. There is no axillary lymphadenopathy. Lungs/Pleura: Spiculated multi lobular right upper lobe nodule shows tethering to the overlying pleura. Volume derived equivalent diameter of this lesion is 2.4 cm. The central tracheobronchial airways are patent. Centrilobular emphysema noted. Subtle centrilobular ground-glass nodularity with an upper lobe predominance is consistent with smoking related lung disease. Upper Abdomen: Small hypoattenuating lesions in the liver cannot be fully characterized and measure up to 10 mm. Thickening of the left adrenal gland evident. Musculoskeletal: Multilevel degenerative disc disease is evident in the thoracic spine. Bilateral gynecomastia evident. IMPRESSION: 1. Spiculated multi lobular 2.4 cm right upper lobe pulmonary lesion consistent with primary bronchogenic neoplasm. Lung-RADS 4B, suspicious. Additional imaging evaluation or consultation with Pulmonology or Thoracic Surgery recommended. 2. No definite mediastinal or hilar lymphadenopathy on this noncontrast study. 3.  Emphysema. (ICD10-J43.9) 4.  Aortic Atherosclerois (ICD10-170.0) 5. These results will be called to the ordering clinician or representative by the Radiologist Assistant, and communication documented in the PACS or zVision Dashboard. Electronically Signed   By: Misty Stanley M.D.   On: 09/03/2017 11:29    ASSESSMENT: Right upper lobe lung nodule.  PLAN:    1. Right upper lobe lung nodule: PET scan results from September 11, 2017 reviewed independently and reported as above revealing a 2.4 cm right upper lobe lesion highly suspicious for underlying malignancy. He has no other noted areas of hypermetabolism suspicious for metastatic disease.  Case was discussed at case conference and patient appears to be a surgical  candidate.  He has adequate PFTs.  Initially, patient was hesitant to pursue any type of surgery, but has agreed to meet with thoracic surgery to discuss his options.  If he declines surgery, patient will likely require biopsy along with XRT.  Further follow-up will depend on his discussion with thoracic surgery.  I spent a total of 60 minutes face-to-face with the patient of which greater than 50% of the visit was spent in counseling and coordination of care as summarized above.  Patient expressed understanding and was in agreement with this plan. He also understands that He can call clinic at any time with any questions, concerns, or complaints.   Cancer Staging No matching staging information was found for the patient.  Lloyd Huger, MD   09/22/2017 6:20 PM

## 2017-09-27 ENCOUNTER — Ambulatory Visit: Payer: Self-pay | Admitting: Cardiothoracic Surgery

## 2017-10-07 ENCOUNTER — Encounter: Payer: Self-pay | Admitting: Cardiothoracic Surgery

## 2017-10-07 ENCOUNTER — Other Ambulatory Visit: Payer: Self-pay | Admitting: *Deleted

## 2017-10-07 ENCOUNTER — Ambulatory Visit (INDEPENDENT_AMBULATORY_CARE_PROVIDER_SITE_OTHER): Payer: Medicare Other | Admitting: Cardiothoracic Surgery

## 2017-10-07 VITALS — BP 144/81 | HR 62 | Temp 98.3°F | Ht 69.0 in | Wt 165.0 lb

## 2017-10-07 DIAGNOSIS — R911 Solitary pulmonary nodule: Secondary | ICD-10-CM

## 2017-10-07 DIAGNOSIS — R918 Other nonspecific abnormal finding of lung field: Secondary | ICD-10-CM

## 2017-10-07 NOTE — Progress Notes (Signed)
Patient ID: Samuel Carney, male   DOB: Jun 19, 1945, 72 y.o.   MRN: [id number]  Chief Complaint  Patient presents with  . New Patient (Initial Visit)    2.4 cm right upper lobe lesion    Referred By Dr. Zenia Resides Reason for Referral right upper lobe mass  HPI Location, Quality, Duration, Severity, Timing, Context, Modifying Factors, Associated Signs and Symptoms.  Samuel Carney is a 72 y.o. male.  He states that he has been a lifelong smoker with approximately a 50-pack-year smoking history.  He continues to smoke at least a pack of cigarettes per day.  He had a CT scan of the chest made for lung screening and this revealed a right upper lobe mass.  A subsequent PET scan was also performed which revealed a 2.5 cm right upper lobe mass which was spiculated hypermetabolic on PET concerning for primary bronchogenic carcinoma he also had some pulmonary function studies done which revealed an FEV1 of approximately 100% but a DLCO of approximately 50%.  He states that he does not get short of breath.  He does not complain of any significant cough, fever or chills.  He has not coughed up any blood.  He has had a weight loss of a few pounds but this is been over a protracted period.  His appetite is been good.  He does complain of some leg weakness with exercise but no specific claudication symptoms.  There is no family history of lung cancer.  He has no known exposure to asbestos.  He has not had a biopsy made.  His only previous surgery was a lower abdominal incision for which he underwent kidney stone extraction.  The details of that are unknown.  He states that he has no particular interest in smoking cessation.   Past Medical History:  Diagnosis Date  . History of kidney stones   . Hypertension   . Lung mass     No past surgical history on file.  Family History  Problem Relation Age of Onset  . Stroke Brother   . Diabetes Mellitus II Brother   . Diabetes Mellitus II Mother   . Diabetes Mellitus  II Maternal Uncle     Social History Social History   Tobacco Use  . Smoking status: Current Every Day Smoker    Packs/day: 1.00    Years: 50.00    Pack years: 50.00    Types: Cigarettes  . Smokeless tobacco: Never Used  Substance Use Topics  . Alcohol use: Yes    Comment: "1 pint of liquor per day"  . Drug use: Never    No Known Allergies  Current Outpatient Medications  Medication Sig Dispense Refill  . amLODipine (NORVASC) 10 MG tablet Take 1 tablet by mouth daily.    Marland Kitchen aspirin EC 81 MG tablet Take 1 tablet by mouth daily.    . dorzolamide-timolol (COSOPT) 22.3-6.8 MG/ML ophthalmic solution Place 1 drop into both eyes every 8 (eight) hours.  6  . doxazosin (CARDURA) 2 MG tablet Take 2 mg by mouth daily.  1  . metoprolol tartrate (LOPRESSOR) 100 MG tablet Take 1 tablet by mouth 2 (two) times daily.    . potassium chloride SA (KLOR-CON M20) 20 MEQ tablet Take 1 tablet by mouth daily.    Marland Kitchen telmisartan (MICARDIS) 80 MG tablet Take 1 tablet by mouth daily.    . traMADol (ULTRAM) 50 MG tablet Take 1 tablet by mouth every 4 (four) hours as needed for pain.    Marland Kitchen  Travoprost, BAK Free, (TRAVATAN Z) 0.004 % SOLN ophthalmic solution once daily.     No current facility-administered medications for this visit.       Review of Systems A complete review of systems was asked and was negative except for the following positive findings recent weight loss, thirst at night.  Blood pressure (!) 144/81, pulse 62, temperature 98.3 F (36.8 C), temperature source Oral, height 5\' 9"  (1.753 m), weight 165 lb (74.8 kg).  Physical Exam CONSTITUTIONAL:  Pleasant, well-developed, well-nourished, and in no acute distress. EYES: Pupils equal and reactive to light, Sclera non-icteric EARS, NOSE, MOUTH AND THROAT:  The oropharynx was clear.  Dentition is in poor repair with multiple missing teeth repair.  Oral mucosa pink and moist. LYMPH NODES:  Lymph nodes in the neck and axillae were  normal RESPIRATORY:  Lungs were clear.  Normal respiratory effort without pathologic use of accessory muscles of respiration CARDIOVASCULAR: Heart was regular without murmurs.  There were no carotid bruits. GI: The abdomen was soft, nontender, and nondistended. There were no palpable masses. There was no hepatosplenomegaly. There were normal bowel sounds in all quadrants. GU:  Rectal deferred.   MUSCULOSKELETAL:  Normal muscle strength and tone.  No clubbing or cyanosis.   SKIN:  There were no pathologic skin lesions.  There were no nodules on palpation. NEUROLOGIC:  Sensation is normal.  Cranial nerves are grossly intact. PSYCH:  Oriented to person, place and time.  Mood and affect are normal.  Data Reviewed CT scan and PET scan  I have personally reviewed the patient's imaging, laboratory findings and medical records.    Assessment    I have independently reviewed the patient's CT scan and PET scan.  Of also independently reviewed his pulmonary function studies.  His DLCO is approximately 50% which puts him at a high risk for surgical intervention.  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    Plan    I had a long discussion with him and his daughter regarding the options for his presumed lung cancer.  Risks of surgery versus radiation therapy were discussed.  Options including radiation therapy stereotactic body frame radiotherapy and surgical intervention were discussed.  Advantages and disadvantages as well as the risks of these procedures were reviewed.  He understands that surgical intervention is likely to have a higher cure rate.  After an extensive discussion with he and his daughter they would like to see our radiation therapist for another opinion.  We will also get him to see our cardiologist regarding his extensive coronary atherosclerosis.       Nestor Lewandowsky, MD 10/07/2017, 9:17 AM

## 2017-10-09 ENCOUNTER — Telehealth: Payer: Self-pay | Admitting: *Deleted

## 2017-10-09 NOTE — Telephone Encounter (Addendum)
Spoke with patient regarding appts scheduled for CT biopsy on Monday 7/15 at 9am, arrive at 8am. Pt aware of follow up appt with Dr. Grayland Ormond at the [facility] on Mon 7/22 at 9:45am. Also, made aware of consult with Dr. Baruch Gouty on Fri 7/26 at 10:30am. Informed pt that will call to remind him of appts the day before each appt since pt seemed overwhelmed while on the phone. Pt verbalized understanding. Appts have been mailed as well.

## 2017-10-09 NOTE — Telephone Encounter (Signed)
Message left with patient to callback to review upcoming appts. Awaiting callback.

## 2017-10-11 ENCOUNTER — Other Ambulatory Visit: Payer: Self-pay | Admitting: Student

## 2017-10-14 ENCOUNTER — Ambulatory Visit
Admission: RE | Admit: 2017-10-14 | Discharge: 2017-10-14 | Disposition: A | Discharge status: Home / Self Care | Payer: Medicare Other | Source: Ambulatory Visit | Attending: Oncology | Admitting: Oncology

## 2017-10-14 ENCOUNTER — Other Ambulatory Visit: Payer: Self-pay | Admitting: Oncology

## 2017-10-14 ENCOUNTER — Ambulatory Visit
Admission: RE | Admit: 2017-10-14 | Discharge: 2017-10-14 | Disposition: A | Discharge status: Home / Self Care | Payer: Medicare Other | Source: Ambulatory Visit | Attending: Interventional Radiology | Admitting: Interventional Radiology

## 2017-10-14 DIAGNOSIS — R911 Solitary pulmonary nodule: Secondary | ICD-10-CM | POA: Insufficient documentation

## 2017-10-14 DIAGNOSIS — C3490 Malignant neoplasm of unspecified part of unspecified bronchus or lung: Secondary | ICD-10-CM | POA: Insufficient documentation

## 2017-10-14 DIAGNOSIS — I2510 Atherosclerotic heart disease of native coronary artery without angina pectoris: Secondary | ICD-10-CM | POA: Insufficient documentation

## 2017-10-14 DIAGNOSIS — Z9889 Other specified postprocedural states: Secondary | ICD-10-CM

## 2017-10-14 LAB — CBC
HEMATOCRIT: 35.1 % — AB (ref 40.0–52.0)
HEMOGLOBIN: 12 g/dL — AB (ref 13.0–18.0)
MCH: 34.8 pg — ABNORMAL HIGH (ref 26.0–34.0)
MCHC: 34.2 g/dL (ref 32.0–36.0)
MCV: 101.8 fL — ABNORMAL HIGH (ref 80.0–100.0)
Platelets: 137 10*3/uL — ABNORMAL LOW (ref 150–440)
RBC: 3.44 MIL/uL — AB (ref 4.40–5.90)
RDW: 15.4 % — ABNORMAL HIGH (ref 11.5–14.5)
WBC: 4 10*3/uL (ref 3.8–10.6)

## 2017-10-14 LAB — PROTIME-INR
INR: 0.92
PROTHROMBIN TIME: 12.3 s (ref 11.4–15.2)

## 2017-10-14 LAB — APTT: APTT: 34 s (ref 24–36)

## 2017-10-14 MED ORDER — MIDAZOLAM HCL 2 MG/2ML IJ SOLN
INTRAMUSCULAR | Status: AC | PRN
  Administered 2017-10-14: 2 mg via INTRAVENOUS
  Administered 2017-10-14: 1 mg via INTRAVENOUS

## 2017-10-14 MED ORDER — SODIUM CHLORIDE 0.9 % IV SOLN
INTRAVENOUS | Status: DC
  Administered 2017-10-14: 1000 mL via INTRAVENOUS

## 2017-10-14 MED ORDER — FENTANYL CITRATE (PF) 100 MCG/2ML IJ SOLN
INTRAMUSCULAR | Status: AC
  Filled 2017-10-14: qty 4

## 2017-10-14 MED ORDER — LIDOCAINE HCL 2 % IJ SOLN
INTRAMUSCULAR | Status: AC | PRN
  Administered 2017-10-14: 5 mL via INTRADERMAL

## 2017-10-14 MED ORDER — FENTANYL CITRATE (PF) 100 MCG/2ML IJ SOLN
INTRAMUSCULAR | Status: AC | PRN
  Administered 2017-10-14 (×2): 50 ug via INTRAVENOUS

## 2017-10-14 MED ORDER — MIDAZOLAM HCL 5 MG/5ML IJ SOLN
INTRAMUSCULAR | Status: AC
  Filled 2017-10-14: qty 10

## 2017-10-14 NOTE — Procedures (Signed)
Interventional Radiology Procedure Note  Procedure: CT guided biopsy of RUL pulmonary nodule.  Complications: No immediate Recommendations: - Bedrest until CXR cleared.  Minimize talking, coughing or otherwise straining.  - Follow up 2 hr CXR pending   Signed,  Criselda Peaches, MD

## 2017-10-14 NOTE — Progress Notes (Signed)
Cardiology Office Note  Date:  10/15/2017   ID:  Samuel Carney, DOB 1945-09-10, MRN [id number]  PCP:  Juluis Pitch, MD   Chief Complaint  Patient presents with  . New Patient (Initial Visit)    Referred by Dr. Genevive Bi for Aortic Atherosclerois. Patient c/o cramping in feet and legs at night. Patient denies chest pain and SOB. Meds reviewed verbally with patient.     HPI:  Samuel Carney is a 72 y.o. male hypertension  lifelong smoker, 50-pack-year smoking history COPD continues to smoke at least a pack of cigarettes per day.   CT scan of the chest  right upper lobe mass.  PET scan  revealed a 2.5 cm right upper lobe mass which was spiculated hypermetabolic   concerning for primary bronchogenic carcinoma He presents by referral from Dr. Faith Rogue forshortness of breath and coronary atherosclerosis on CT scan  weight loss of a few pounds leg weakness with exercise   Seen by Dr. Faith Rogue for consultation of his possible lung cancer Felt to be stage I carcinoma the lung based on CT scan and PET scan Biopsy performed 10/13/2017  CT scan with extensive coronary atherosclerosis Images pulled up in the office today and reviewed with him Mild diffuse carotid disease aortic disease Moderate to heavy coronary calcification all 3 vessels  Some shortness of breath with exertion No regular exercise program Denies any chest pain   EKG personally reviewed by myself on todays visit Shows normal sinus rhythm rate 58 beats minute T-wave inversion V3 through V5 1 and aVL concerning for anterolateral ischemia   PMH:   has a past medical history of History of kidney stones, Hypertension, and Lung mass.smoker, cOPD, coronary artery disease  PSH:   History reviewed. No pertinent surgical history.  Current Outpatient Medications  Medication Sig Dispense Refill  . amLODipine (NORVASC) 10 MG tablet Take 1 tablet by mouth daily.    Marland Kitchen aspirin EC 81 MG tablet Take 1 tablet by mouth daily.    .  dorzolamide-timolol (COSOPT) 22.3-6.8 MG/ML ophthalmic solution Place 1 drop into both eyes every 8 (eight) hours.  6  . doxazosin (CARDURA) 2 MG tablet Take 2 mg by mouth daily.  1  . metoprolol tartrate (LOPRESSOR) 100 MG tablet Take 1 tablet by mouth 2 (two) times daily.    . potassium chloride SA (KLOR-CON M20) 20 MEQ tablet Take 1 tablet by mouth daily.    Marland Kitchen telmisartan (MICARDIS) 80 MG tablet Take 1 tablet by mouth daily.    . traMADol (ULTRAM) 50 MG tablet Take 1 tablet by mouth every 4 (four) hours as needed for pain.    . Travoprost, BAK Free, (TRAVATAN Z) 0.004 % SOLN ophthalmic solution once daily.     No current facility-administered medications for this visit.      Allergies:   Patient has no known allergies.   Social History:  The patient  reports that he has been smoking cigarettes.  He has a 50.00 pack-year smoking history. He has never used smokeless tobacco. He reports that he drinks alcohol. He reports that he does not use drugs.   Family History:   family history includes Diabetes Mellitus II in his brother, maternal uncle, and mother; Stroke in his brother.    Review of Systems: Review of Systems  Constitutional: Negative.   Respiratory: Positive for shortness of breath.   Cardiovascular: Negative.   Gastrointestinal: Negative.   Musculoskeletal: Negative.   Neurological: Negative.   Psychiatric/Behavioral: Negative.   All  other systems reviewed and are negative.    PHYSICAL EXAM: VS:  BP (!) 144/70 (BP Location: Right Arm, Patient Position: Sitting, Cuff Size: Normal)   Pulse (!) 58   Ht 5\' 9"  (1.753 m)   Wt 162 lb 4 oz (73.6 kg)   BMI 23.96 kg/m  , BMI Body mass index is 23.96 kg/m. GEN: Well nourished, well developed, in no acute distress  HEENT: normal  Neck: no JVD, carotid bruits, or masses Cardiac: RRR; no murmurs, rubs, or gallops,no edema  Respiratory:  Moderately decreased breath sounds throughout, normal work of breathing GI: soft,  nontender, nondistended, + BS MS: no deformity or atrophy  Skin: warm and dry, no rash Neuro:  Strength and sensation are intact Psych: euthymic mood, full affect   Recent Labs: 10/14/2017: Hemoglobin 12.0; Platelets 137    Lipid Panel No results found for: CHOL, HDL, LDLCALC, TRIG    Wt Readings from Last 3 Encounters:  10/15/17 162 lb 4 oz (73.6 kg)  10/14/17 165 lb (74.8 kg)  10/07/17 165 lb (74.8 kg)       ASSESSMENT AND PLAN:  Malignant 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) Scheduled for consideration of radiation Not sure if he is having surgery Seen by Dr. Faith Rogue  Coronary artery calcification seen on CT scan - Plan: EKG 12-Lead Heavy coronary calcification all 3 vessels as well as diffuse aortic atherosclerosis Given his abnormal EKG and shortness of breath symptoms we have ordered a pharmacologic Myoview  Nodule of upper lobe of right lung Scheduled for radiation Not sure if he wants to have surgery  Tobacco abuse We have encouraged him to continue to work on weaning his cigarettes and smoking cessation. He will continue to work on this and does not want any assistance with chantix.   Benign essential hypertension Blood pressure is well controlled on today's visit. No changes made to the medications.  Hyperlipidemia We have added Crestor 5 mill grams daily  Disposition:   F/U  As needed   Total encounter time more than 45 minutes  Greater than 50% was spent in counseling and coordination of care with the patient    Orders Placed This Encounter  Procedures  . EKG 12-Lead     Signed, Esmond Plants, M.D., Ph.D. 10/15/2017  [place], [place]

## 2017-10-14 NOTE — Discharge Instructions (Signed)
Needle Biopsy of the Lung, Care After °This sheet gives you information about how to care for yourself after your procedure. Your health care provider may also give you more specific instructions. If you have problems or questions, contact your health care provider. °What can I expect after the procedure? °After the procedure, it is common to have: °· Soreness, pain, and tenderness where a tissue sample was taken (biopsy site). °· A cough. °· A sore throat. ° °Follow these instructions at home: °Biopsy site care °· Follow instructions from your health care provider about when to remove the bandage that was placed on the biopsy site. °· Keep the bandage dry until it has been removed. °· Check your biopsy site every day for signs of infection. Check for: °? More redness, swelling, or pain. °? More fluid or blood. °? Warmth to the touch. °? Pus or a bad smell. °General instructions °· Rest as directed by your health care provider. Ask your health care provider what activities are safe for you. °· Do not take baths, swim, or use a hot tub until your health care provider approves. °· Take over-the-counter and prescription medicines only as told by your health care provider. °· If you have airplane travel scheduled, talk with your health care provider about when it is safe for you to travel by airplane. °· It is up to you to get the results of your procedure. Ask your health care provider, or the department that is doing the procedure, when your results will be ready. °· Keep all follow-up visits as told by your health care provider. This is important. °Contact a health care provider if: °· You have more redness, swelling, or pain around your biopsy site. °· You have more fluid or blood coming from your biopsy site. °· Your biopsy site feels warm to the touch. °· You have pus or a bad smell coming from your biopsy site. °· You have a fever. °· You have pain that does not get better with medicine. °Get help right away  if: °· You have problems breathing. °· You have chest pain. °· You cough up blood. °· You faint. °· You have a fast heart rate. °Summary °· After a needle biopsy of the lung, it is common to have a cough, a sore throat, or soreness, pain, and tenderness where a tissue sample was taken (biopsy site). °· You should check your biopsy area every day for signs of infection, including pus or a bad smell, warmth, more fluid or blood, or more redness, swelling, or pain. °· You should not take baths, swim, or use a hot tub until your health care provider approves. °· It is up to you to get the results of your procedure. Ask your health care provider, or the department that is doing the procedure, when your results will be ready. °This information is not intended to replace advice given to you by your health care provider. Make sure you discuss any questions you have with your health care provider. °Document Released: 01/14/2007 Document Revised: 02/08/2016 Document Reviewed: 02/08/2016 °Elsevier Interactive Patient Education © 2017 Elsevier Inc. ° ° °

## 2017-10-15 ENCOUNTER — Encounter: Payer: Self-pay | Admitting: Cardiovascular Disease

## 2017-10-15 ENCOUNTER — Ambulatory Visit: Payer: Medicare Other | Admitting: Cardiovascular Disease

## 2017-10-15 VITALS — BP 144/70 | HR 58 | Ht 69.0 in | Wt 162.2 lb

## 2017-10-15 DIAGNOSIS — Z720 Tobacco use: Secondary | ICD-10-CM

## 2017-10-15 DIAGNOSIS — R0602 Shortness of breath: Secondary | ICD-10-CM

## 2017-10-15 DIAGNOSIS — I10 Essential (primary) hypertension: Secondary | ICD-10-CM

## 2017-10-15 DIAGNOSIS — R911 Solitary pulmonary nodule: Secondary | ICD-10-CM | POA: Diagnosis not present

## 2017-10-15 DIAGNOSIS — I2510 Atherosclerotic heart disease of native coronary artery without angina pectoris: Secondary | ICD-10-CM

## 2017-10-15 DIAGNOSIS — C3411 Malignant neoplasm of upper lobe, right bronchus or lung: Secondary | ICD-10-CM

## 2017-10-15 MED ORDER — ROSUVASTATIN CALCIUM 5 MG PO TABS: 5.0000 mg | ORAL_TABLET | Freq: Every day | ORAL | 3 refills | Status: DC

## 2017-10-15 NOTE — Patient Instructions (Addendum)
Medication Instructions:   Please start crestor 5 mg daily for cholesterol  Labwork:  No new labs needed  Testing/Procedures:  We will order a lexiscan myoview for CAD, shortness of breath and abn ekg HOLD the amlodipine and cardura the morning before the test No food the morning of the test No smoking 24 hours before the test [place]  Your caregiver has ordered a Stress Test with nuclear imaging. The purpose of this test is to evaluate the blood supply to your heart muscle. This procedure is referred to as a "Non-Invasive Stress Test." This is because other than having an IV started in your vein, nothing is inserted or "invades" your body. Cardiac stress tests are done to find areas of poor blood flow to the heart by determining the extent of coronary artery disease (CAD). Some patients exercise on a treadmill, which naturally increases the blood flow to your heart, while others who are  unable to walk on a treadmill due to physical limitations have a pharmacologic/chemical stress agent called Lexiscan . This medicine will mimic walking on a treadmill by temporarily increasing your coronary blood flow.   Please note: these test may take anywhere between 2-4 hours to complete  PLEASE REPORT TO [facility] AT THE FIRST DESK WILL DIRECT YOU WHERE TO GO  Date of Procedure:_____________________________________  Arrival Time for Procedure:______________________________  Instructions regarding medication:    _XX___:  Hold Metoprolol the night before procedure and morning of procedure  _XX___:  Hold Amlodipine and Doxazosin the morning of your procedure.   PLEASE NOTIFY THE OFFICE AT LEAST 45 HOURS IN ADVANCE IF YOU ARE UNABLE TO KEEP YOUR APPOINTMENT.  [phone] AND  PLEASE NOTIFY NUCLEAR MEDICINE AT [facility] AT LEAST 24 HOURS IN ADVANCE IF YOU ARE UNABLE TO KEEP YOUR APPOINTMENT. [phone]  How to prepare for your Myoview test:  1. Do not eat  or drink after midnight 2. No caffeine for 24 hours prior to test 3. No smoking 24 hours prior to test. 4. Your medication may be taken with water.  If your doctor stopped a medication because of this test, do not take that medication. 5. Ladies, please do not wear dresses.  Skirts or pants are appropriate. Please wear a short sleeve shirt. 6. No perfume, cologne or lotion. 7. Wear comfortable walking shoes. No heels!   Follow-Up: It was a pleasure seeing you in the office today. Please call us if you have new issues that need to be addressed before your next appt.  [phone]  Your physician wants you to follow-up in:  As needed  If you need a refill on your cardiac medications before your next appointment, please call your pharmacy.  For educational health videos Log in to : [email or web address] Or : [email or web address], password : [email or web address] Cardiac Nuclear Scan A cardiac nuclear scan is a test that measures blood flow to the heart when a person is resting and when he or she is exercising. The test looks for problems such as:  Not enough blood reaching a portion of the heart.  The heart muscle not working normally.  You may need this test if:  You have heart disease.  You have had abnormal lab results.  You have had heart surgery or angioplasty.  You have chest pain.  You have shortness of breath.  In this test, a radioactive dye (tracer) is injected into your bloodstream. After the tracer has traveled to your heart, an imaging device is used to  measure how much of the tracer is absorbed by or distributed to various areas of your heart. This procedure is usually done at a hospital and takes 2-4 hours. Tell a health care provider about:  Any allergies you have.  All medicines you are taking, including vitamins, herbs, eye drops, creams, and over-the-counter medicines.  Any problems you or family members have had with the use of anesthetic medicines.  Any blood disorders you  have.  Any surgeries you have had.  Any medical conditions you have.  Whether you are pregnant or may be pregnant. What are the risks? Generally, this is a safe procedure. However, problems may occur, including:  Serious chest pain and heart attack. This is only a risk if the stress portion of the test is done.  Rapid heartbeat.  Sensation of warmth in your chest. This usually passes quickly.  What happens before the procedure?  Ask your health care provider about changing or stopping your regular medicines. This is especially important if you are taking diabetes medicines or blood thinners.  Remove your jewelry on the day of the procedure. What happens during the procedure?  An IV tube will be inserted into one of your veins.  Your health care provider will inject a small amount of radioactive tracer through the tube.  You will wait for 20-40 minutes while the tracer travels through your bloodstream.  Your heart activity will be monitored with an electrocardiogram (ECG).  You will lie down on an exam table.  Images of your heart will be taken for about 15-20 minutes.  You may be asked to exercise on a treadmill or stationary bike. While you exercise, your heart's activity will be monitored with an ECG, and your blood pressure will be checked. If you are unable to exercise, you may be given a medicine to increase blood flow to parts of your heart.  When blood flow to your heart has peaked, a tracer will again be injected through the IV tube.  After 20-40 minutes, you will get back on the exam table and have more images taken of your heart.  When the procedure is over, your IV tube will be removed. The procedure may vary among health care providers and hospitals. Depending on the type of tracer used, scans may need to be repeated 3-4 hours later. What happens after the procedure?  Unless your health care provider tells you otherwise, you may return to your normal schedule,  including diet, activities, and medicines.  Unless your health care provider tells you otherwise, you may increase your fluid intake. This will help flush the contrast dye from your body. Drink enough fluid to keep your urine clear or pale yellow.  It is up to you to get your test results. Ask your health care provider, or the department that is doing the test, when your results will be ready. Summary  A cardiac nuclear scan measures the blood flow to the heart when a person is resting and when he or she is exercising.  You may need this test if you are at risk for heart disease.  Tell your health care provider if you are pregnant.  Unless your health care provider tells you otherwise, increase your fluid intake. This will help flush the contrast dye from your body. Drink enough fluid to keep your urine clear or pale yellow. This information is not intended to replace advice given to you by your health care provider. Make sure you discuss any questions you have with  your health care provider. Document Released: 04/13/2004 Document Revised: 03/21/2016 Document Reviewed: 02/25/2013 Elsevier Interactive Patient Education  2017 [employer].

## 2017-10-16 LAB — SURGICAL PATHOLOGY

## 2017-10-21 ENCOUNTER — Encounter: Payer: Self-pay | Admitting: Oncology

## 2017-10-21 ENCOUNTER — Encounter: Payer: Self-pay | Admitting: *Deleted

## 2017-10-21 ENCOUNTER — Other Ambulatory Visit: Payer: Self-pay

## 2017-10-21 ENCOUNTER — Inpatient Hospital Stay: Payer: Medicare Other | Attending: Oncology | Admitting: Oncology

## 2017-10-21 VITALS — BP 137/77 | HR 60 | Temp 97.3°F | Resp 12 | Ht 69.0 in | Wt 162.3 lb

## 2017-10-21 DIAGNOSIS — F17210 Nicotine dependence, cigarettes, uncomplicated: Secondary | ICD-10-CM | POA: Diagnosis not present

## 2017-10-21 DIAGNOSIS — Z79899 Other long term (current) drug therapy: Secondary | ICD-10-CM | POA: Diagnosis not present

## 2017-10-21 DIAGNOSIS — C3411 Malignant neoplasm of upper lobe, right bronchus or lung: Secondary | ICD-10-CM | POA: Insufficient documentation

## 2017-10-21 DIAGNOSIS — I10 Essential (primary) hypertension: Secondary | ICD-10-CM | POA: Diagnosis not present

## 2017-10-21 DIAGNOSIS — Z7982 Long term (current) use of aspirin: Secondary | ICD-10-CM | POA: Insufficient documentation

## 2017-10-21 DIAGNOSIS — Z87442 Personal history of urinary calculi: Secondary | ICD-10-CM | POA: Diagnosis not present

## 2017-10-21 NOTE — Progress Notes (Signed)
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  INTERVAL HISTORY: Patient returns to clinic today for discussion of his biopsy results and treatment planning.  He continues to feel well and remains asymptomatic.  He has no neurologic complaints.  Patient denies any recent fevers or illnesses.  He has a good appetite and denies weight loss.  He has no chest pain, cough, shortness of breath, or hemoptysis.  He denies any nausea, vomiting, constipation, or diarrhea.  He has no urinary complaints.  Patient offers no specific complaints today.  REVIEW OF SYSTEMS:   Review of Systems  Constitutional: Negative.  Negative for fever, malaise/fatigue and weight loss.  Respiratory: Negative.  Negative for cough, hemoptysis and shortness of breath.   Cardiovascular: Negative.  Negative for chest pain and leg swelling.  Gastrointestinal: Negative.  Negative for abdominal pain.  Genitourinary: Negative.  Negative for dysuria.  Musculoskeletal: Negative.  Negative for back pain.  Skin: Negative.  Negative for rash.  Neurological: Negative.  Negative for sensory change, focal weakness and weakness.  Psychiatric/Behavioral: Negative.  The patient is not nervous/anxious.     As per HPI. Otherwise, a complete review of systems is negative.  PAST MEDICAL HISTORY: Past Medical History:  Diagnosis Date  . History of kidney stones   . Hypertension   . Lung mass     PAST SURGICAL HISTORY: History reviewed. No pertinent surgical history.  FAMILY HISTORY: Family History  Problem Relation Age of Onset  . Stroke Brother   . Diabetes Mellitus II Brother   . Diabetes Mellitus II Mother   . Diabetes Mellitus II  Maternal Uncle     ADVANCED DIRECTIVES (Y/N):  N  HEALTH MAINTENANCE: Social History   Tobacco Use  . Smoking status: Current Every Day Smoker    Packs/day: 1.00    Years: 50.00    Pack years: 50.00    Types: Cigarettes  . Smokeless tobacco: Never Used  Substance Use Topics  . Alcohol use: Yes    Comment: "1 pint of liquor per day"  . Drug use: Never     Colonoscopy:  PAP:  Bone density:  Lipid panel:  No Known Allergies  Current Outpatient Medications  Medication Sig Dispense Refill  . amLODipine (NORVASC) 10 MG tablet Take 1 tablet by mouth daily.    Marland Kitchen aspirin EC 81 MG tablet Take 1 tablet by mouth daily.    . dorzolamide-timolol (COSOPT) 22.3-6.8 MG/ML ophthalmic solution Place 1 drop into both eyes every 8 (eight) hours.  6  . doxazosin (CARDURA) 2 MG tablet Take 2 mg by mouth daily.  1  . metoprolol tartrate (LOPRESSOR) 100 MG tablet Take 1 tablet by mouth 2 (two) times daily.    . potassium chloride SA (KLOR-CON M20) 20 MEQ tablet Take 1 tablet by mouth daily.    . rosuvastatin (CRESTOR) 5 MG tablet Take 1 tablet (5 mg total) by mouth daily. 90 tablet 3  . telmisartan (MICARDIS) 80 MG tablet Take 1 tablet by mouth daily.    . traMADol (ULTRAM) 50 MG tablet Take 1 tablet by mouth every 4 (four) hours as needed for pain.    . Travoprost, BAK Free, (TRAVATAN Z) 0.004 % SOLN ophthalmic solution once daily.  No current facility-administered medications for this visit.     OBJECTIVE: Vitals:   10/21/17 0936  Resp: 12     Body mass index is 23.97 kg/m.    ECOG FS:0 - Asymptomatic  General: Well-developed, well-nourished, no acute distress. Eyes: Pink conjunctiva, anicteric sclera. HEENT: Normocephalic, moist mucous membranes. Lungs: Clear to auscultation bilaterally. Heart: Regular rate and rhythm. No rubs, murmurs, or gallops. Abdomen: Soft, nontender, nondistended. No organomegaly noted, normoactive bowel sounds. Musculoskeletal: No edema, cyanosis, or  clubbing. Neuro: Alert, answering all questions appropriately. Cranial nerves grossly intact. Skin: No rashes or petechiae noted. Psych: Normal affect.   LAB RESULTS:  Lab Results  Component Value Date   NA 136 12/28/2012   K 3.5 12/28/2012   CL 105 12/28/2012   CO2 26 12/28/2012   GLUCOSE 96 12/28/2012   BUN 14 12/28/2012   CREATININE 1.09 12/28/2012   CALCIUM 8.8 12/28/2012   PROT 6.9 12/28/2012   ALBUMIN 3.1 (L) 12/28/2012   AST 32 12/28/2012   ALT 16 12/28/2012   ALKPHOS 91 12/28/2012   BILITOT 1.0 12/28/2012   GFRNONAA >60 12/28/2012   GFRAA >60 12/28/2012    Lab Results  Component Value Date   WBC 4.0 Nov 08, 2017   NEUTROABS 12.9 (H) 12/28/2012   HGB 12.0 (L) Nov 08, 2017   HCT 35.1 (L) 11/08/2017   MCV 101.8 (H) 11/08/2017   PLT 137 (L) 08-Nov-2017     STUDIES: Ct Biopsy  Result Date: 11-08-2017 INDICATION: 72 year old male with hypermetabolic right upper lobe pulmonary nodule concerning for primary bronchogenic carcinoma. He presents for CT-guided biopsy of the same. EXAM: CT-guided biopsy right upper lobe lobe pulmonary nodule Interventional Radiologist:  Criselda Peaches, MD MEDICATIONS: None. ANESTHESIA/SEDATION: Fentanyl 125 mcg IV; Versed 4 mg IV Moderate Sedation Time:  20 minutes The patient was continuously monitored during the procedure by the interventional radiology nurse under my direct supervision. FLUOROSCOPY TIME:  Fluoroscopy Time: 0 minutes 0 seconds (0 mGy). COMPLICATIONS: None immediate. Estimated blood loss:  0 PROCEDURE: Informed written consent was obtained from the patient after a thorough discussion of the procedural risks, benefits and alternatives. All questions were addressed. Maximal Sterile Barrier Technique was utilized including caps, mask, sterile gowns, sterile gloves, sterile drape, hand hygiene and skin antiseptic. A timeout was performed prior to the initiation of the procedure. A planning axial CT scan was performed. The nodule in  the right upper lobe was successfully identified. A suitable skin entry site was selected and marked. The region was then sterilely prepped and draped in standard fashion using Betadine skin prep. Local anesthesia was attained by infiltration with 1% lidocaine. A small dermatotomy was made. Under intermittent CT fluoroscopic guidance, a 17 gauge trocar needle was advanced into the lung and positioned at the margin of the nodule. Multiple 18 gauge core biopsies were then coaxially obtained using the BioPince automated biopsy device. Biopsy specimens were placed in formalin and delivered to pathology for further analysis. A bio sentry device was deployed. The biopsy device and introducer needle were removed. Post biopsy axial CT imaging demonstrates no evidence of immediate complication. There is no pneumothorax. Mild perilesional alveolar hemorrhage is not unexpected. The patient tolerated the procedure well. IMPRESSION: Technically successful CT-guided biopsy right upper lobe pulmonary nodule. Electronically Signed   By: Jacqulynn Cadet M.D.   On: 08-Nov-2017 13:37   Dg Chest Port 1 View  Result Date: Nov 08, 2017 CLINICAL DATA:  Status post CT-guided biopsy of right upper lobe lung nodule. EXAM: PORTABLE CHEST 1  VIEW COMPARISON:  Chest CT, 10/14/2017 and 09/03/2017. FINDINGS: Ill-defined mass in the right lobe with adjacent interstitial hazy type opacities. There is no pneumothorax. Lungs are hyperexpanded. There are prominent bronchovascular markings in the bases. No other lung nodules or masses. No pleural effusion. IMPRESSION: 1. Status post right upper lobe lung biopsy.  No pneumothorax. Electronically Signed   By: Lajean Manes M.D.   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  PLAN:    1. 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 He has no other areas of hypermetabolism suspicious for metastatic disease.  Patient was evaluated by thoracic surgery and determined not to be a surgical candidate.  Subsequent biopsy confirmed malignancy.  Given the stage of disease, patient does not require chemotherapy and has an appointment with radiation oncology later this week to discuss treatments.  No further interventions are needed at this time.  Will repeat PET scan in 4 months which will be approximately 3 months after the conclusion of his XRT.  Return to clinic 1 to 2 days after imaging to discuss the results.  I spent a total of 30 minutes face-to-face with the patient of which greater than 50% of the visit was spent in counseling and coordination of care as detailed above.   Patient expressed understanding and was in agreement with this plan. He also understands that He can call clinic at any time with any questions, concerns, or complaints.   Cancer Staging Lung cancer [facility]) Staging form: Lung, AJCC 8th Edition - Clinical stage from 10/21/2017: Stage IA3 (cT1c, cN0, cM0) - Signed by Lloyd Huger, MD on 10/21/2017   Lloyd Huger, MD   10/21/2017 9:38 AM

## 2017-10-21 NOTE — Progress Notes (Signed)
  Oncology Nurse Navigator Documentation  Navigator Location: [facility]-Med Onc (10/21/17 1400)   )Navigator Encounter Type: Follow-up Appt (10/21/17 1400)     Confirmed Diagnosis Date: 10/16/17 (10/21/17 1400)               Patient Visit Type: MedOnc (10/21/17 1400) Treatment Phase: Pre-Tx/Tx Discussion (10/21/17 1400) Barriers/Navigation Needs: Coordination of Care (10/21/17 1400)   Interventions: Coordination of Care (10/21/17 1400)   Coordination of Care: Appts (10/21/17 1400)         met with patient during follow up visit with Dr. Grayland Ormond to review results from recent biopsy and discuss treatment planning. All questions answered at the time of visit. Pt given resources regarding diagnosis and supportive services available. Reviewed upcoming appts. Instructed to call with any further questions or concerns.         Time Spent with Patient: 30 (10/21/17 1400)

## 2017-10-21 NOTE — Progress Notes (Signed)
Patient here for results no changes since last appoinment.

## 2017-10-23 ENCOUNTER — Encounter
Admission: RE | Admit: 2017-10-23 | Discharge: 2017-10-23 | Disposition: A | Discharge status: Home / Self Care | Payer: Medicare Other | Source: Ambulatory Visit | Attending: Cardiovascular Disease | Admitting: Cardiovascular Disease

## 2017-10-23 DIAGNOSIS — R0602 Shortness of breath: Secondary | ICD-10-CM | POA: Insufficient documentation

## 2017-10-23 LAB — NM MYOCAR MULTI W/SPECT W/WALL MOTION / EF
CSEPHR: 52 %
CSEPPHR: 78 {beats}/min
LV dias vol: 64 mL (ref 62–150)
LVSYSVOL: 27 mL
NUC STRESS TID: 0.88
Rest HR: 52 {beats}/min

## 2017-10-23 MED ORDER — REGADENOSON 0.4 MG/5ML IV SOLN
0.4000 mg | Freq: Once | INTRAVENOUS | Status: AC
  Administered 2017-10-23: 0.4 mg via INTRAVENOUS

## 2017-10-23 MED ORDER — TECHNETIUM TC 99M TETROFOSMIN IV KIT
13.8600 | PACK | Freq: Once | INTRAVENOUS | Status: AC | PRN
  Administered 2017-10-23: 13.86 via INTRAVENOUS

## 2017-10-23 MED ORDER — TECHNETIUM TC 99M TETROFOSMIN IV KIT
32.6680 | PACK | Freq: Once | INTRAVENOUS | Status: AC | PRN
  Administered 2017-10-23: 32.668 via INTRAVENOUS

## 2017-10-25 ENCOUNTER — Encounter: Payer: Self-pay | Admitting: Radiation Oncology

## 2017-10-25 ENCOUNTER — Ambulatory Visit
Admission: RE | Admit: 2017-10-25 | Discharge: 2017-10-25 | Disposition: A | Discharge status: Home / Self Care | Payer: Medicare Other | Source: Ambulatory Visit | Attending: Radiation Oncology | Admitting: Radiation Oncology

## 2017-10-25 ENCOUNTER — Encounter: Payer: Self-pay | Admitting: *Deleted

## 2017-10-25 ENCOUNTER — Other Ambulatory Visit: Payer: Self-pay

## 2017-10-25 VITALS — BP 145/73 | HR 54 | Temp 97.0°F | Wt 162.9 lb

## 2017-10-25 DIAGNOSIS — F17210 Nicotine dependence, cigarettes, uncomplicated: Secondary | ICD-10-CM | POA: Insufficient documentation

## 2017-10-25 DIAGNOSIS — Z87442 Personal history of urinary calculi: Secondary | ICD-10-CM | POA: Diagnosis not present

## 2017-10-25 DIAGNOSIS — Z79899 Other long term (current) drug therapy: Secondary | ICD-10-CM | POA: Insufficient documentation

## 2017-10-25 DIAGNOSIS — Z823 Family history of stroke: Secondary | ICD-10-CM | POA: Diagnosis not present

## 2017-10-25 DIAGNOSIS — Z833 Family history of diabetes mellitus: Secondary | ICD-10-CM | POA: Insufficient documentation

## 2017-10-25 DIAGNOSIS — Z7982 Long term (current) use of aspirin: Secondary | ICD-10-CM | POA: Diagnosis not present

## 2017-10-25 DIAGNOSIS — I10 Essential (primary) hypertension: Secondary | ICD-10-CM | POA: Insufficient documentation

## 2017-10-25 DIAGNOSIS — C3411 Malignant neoplasm of upper lobe, right bronchus or lung: Secondary | ICD-10-CM | POA: Diagnosis not present

## 2017-10-25 NOTE — Progress Notes (Signed)
  Oncology Nurse Navigator Documentation  Navigator Location: [facility] Onc (10/25/17 1100)   )Navigator Encounter Type: Initial RadOnc (10/25/17 1100)                       Treatment Phase: Pre-Tx/Tx Discussion (10/25/17 1100) Barriers/Navigation Needs: No barriers at this time (10/25/17 1100)   Interventions: None required (10/25/17 1100)             met with patient prior to rad-onc consultation. All questions answered at the time of visit. Reviewed upcoming appts. Informed pt to call with any further questions or needs. Pt verbalized understanding.         Time Spent with Patient: 15 (10/25/17 1100)

## 2017-10-25 NOTE — Consult Note (Signed)
NEW PATIENT EVALUATION  Name: Samuel Carney  MRN: [id number]  Date:   10/25/2017     DOB: 1945/10/05   This 72 y.o. 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 Lung Cancer    initial Eval    DIAGNOSIS: The encounter diagnosis was Malignant 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 ([place]).   PREVIOUS INVESTIGATIONS:  PET CT and CT scans reviewed Pathology reports reviewed Clinical notes reviewed  HPI: patient is a 72 year old male with at least 50-pack-year smoking history who underwent lung screening and was found to have a right upper lobe mass. C PET CT scan also demonstrated hypermetabolic activity in a 2.5 cm right upper lobe mass which was spiculated and concerning for primary bronchogenic carcinoma. His DLCO was approximately 50%CT-scan of the abdomen-guided core biopsy was positive for adenocarcinoma with acinar and micropapillary patterns. He has been seen by surgical oncologist and has declined surgery and is now referred to radiation oncology for opinion. He is asymptomatic. He specifically denies cough hemoptysis chest tightness or marked dyspnea on exertion.  PLANNED TREATMENT REGIMEN: SB RT  PAST MEDICAL HISTORY:  has a past medical history of History of kidney stones, Hypertension, and Lung mass.    PAST SURGICAL HISTORY: History reviewed. No pertinent surgical history.  FAMILY HISTORY: family history includes Diabetes Mellitus II in his brother, maternal uncle, and mother; Stroke in his brother.  SOCIAL HISTORY:  reports that he has been smoking cigarettes.  He has a 50.00 pack-year smoking history. He has never used smokeless tobacco. He reports that he drinks alcohol. He reports that he does not use drugs.  ALLERGIES: Patient has no known allergies.  MEDICATIONS:  Current Outpatient  Medications  Medication Sig Dispense Refill  . amLODipine (NORVASC) 10 MG tablet Take 1 tablet by mouth daily.    Marland Kitchen aspirin EC 81 MG tablet Take 1 tablet by mouth daily.    . dorzolamide-timolol (COSOPT) 22.3-6.8 MG/ML ophthalmic solution Place 1 drop into both eyes every 8 (eight) hours.  6  . doxazosin (CARDURA) 2 MG tablet Take 2 mg by mouth daily.  1  . metoprolol tartrate (LOPRESSOR) 100 MG tablet Take 1 tablet by mouth 2 (two) times daily.    . potassium chloride SA (KLOR-CON M20) 20 MEQ tablet Take 1 tablet by mouth daily.    . rosuvastatin (CRESTOR) 5 MG tablet Take 1 tablet (5 mg total) by mouth daily. 90 tablet 3  . telmisartan (MICARDIS) 80 MG tablet Take 1 tablet by mouth daily.    . traMADol (ULTRAM) 50 MG tablet Take 1 tablet by mouth every 4 (four) hours as needed for pain.    . Travoprost, BAK Free, (TRAVATAN Z) 0.004 % SOLN ophthalmic solution once daily.     No current facility-administered medications for this encounter.     ECOG PERFORMANCE STATUS:  0 - Asymptomatic  REVIEW OF SYSTEMS:  Patient denies any weight loss, fatigue, weakness, fever, chills or night sweats. Patient denies any loss of vision, blurred vision. Patient denies any ringing  of the ears or hearing loss. No irregular heartbeat. Patient denies heart murmur or history of fainting. Patient denies any chest pain or pain radiating to her upper extremities. Patient denies any shortness of breath, difficulty breathing at night, cough or hemoptysis. Patient denies any swelling in the lower  legs. Patient denies any nausea vomiting, vomiting of blood, or coffee ground material in the vomitus. Patient denies any stomach pain. Patient states has had normal bowel movements no significant constipation or diarrhea. Patient denies any dysuria, hematuria or significant nocturia. Patient denies any problems walking, swelling in the joints or loss of balance. Patient denies any skin changes, loss of hair or loss of weight.  Patient denies any excessive worrying or anxiety or significant depression. Patient denies any problems with insomnia. Patient denies excessive thirst, polyuria, polydipsia. Patient denies any swollen glands, patient denies easy bruising or easy bleeding. Patient denies any recent infections, allergies or URI. Patient "s visual fields have not changed significantly in recent time.    PHYSICAL EXAM: BP (!) 145/73   Pulse (!) 54   Temp (!) 97 F (36.1 C)   Wt 162 lb 14.7 oz (73.9 kg)   BMI 24.06 kg/m  Well-developed well-nourished patient in NAD. HEENT reveals PERLA, EOMI, discs not visualized.  Oral cavity is clear. No oral mucosal lesions are identified. Neck is clear without evidence of cervical or supraclavicular adenopathy. Lungs are clear to A&P. Cardiac examination is essentially unremarkable with regular rate and rhythm without murmur rub or thrill. Abdomen is benign with no organomegaly or masses noted. Motor sensory and DTR levels are equal and symmetric in the upper and lower extremities. Cranial nerves II through XII are grossly intact. Proprioception is intact. No peripheral adenopathy or edema is identified. No motor or sensory levels are noted. Crude visual fields are within normal range.  LABORATORY DATA: pathology reports reviewed    RADIOLOGY RESULTS:PET CT and CT scans reviewed   IMPRESSION: stage I adenocarcinoma the right upper lobe in47 year old male  PLAN: this time I to go ahead with SB RT to his right upper lobe. Would plan on delivering 6000 cGy in 5 fractions. Risks and benefits of treatment including possible development of cough fatigue possible skin reaction and lung scarring all were discussed in detail with the patient and his daughter. I have personally set up and ordered CT simulation for next week. Will use4D motion tracking as well as PET CT fusion in her treatment planning. Patient and daughter both seem to comprehend my treatment plan well.  I would like to  take this opportunity to thank you for allowing me to participate in the care of your patient.Noreene Filbert, MD

## 2017-10-31 ENCOUNTER — Ambulatory Visit
Admission: RE | Admit: 2017-10-31 | Discharge: 2017-10-31 | Disposition: A | Discharge status: Home / Self Care | Payer: Medicare Other | Source: Ambulatory Visit | Attending: Radiation Oncology | Admitting: Radiation Oncology

## 2017-10-31 DIAGNOSIS — Z510 Encounter for antineoplastic radiation therapy: Secondary | ICD-10-CM | POA: Diagnosis not present

## 2017-10-31 DIAGNOSIS — C3411 Malignant neoplasm of upper lobe, right bronchus or lung: Secondary | ICD-10-CM | POA: Insufficient documentation

## 2017-11-01 ENCOUNTER — Ambulatory Visit: Payer: Self-pay | Admitting: Cardiothoracic Surgery

## 2017-11-04 DIAGNOSIS — C3411 Malignant neoplasm of upper lobe, right bronchus or lung: Secondary | ICD-10-CM | POA: Diagnosis not present

## 2017-11-05 ENCOUNTER — Telehealth: Payer: Self-pay

## 2017-11-05 NOTE — Telephone Encounter (Signed)
Patient was seen by Dr. Baruch Gouty 10/25/2017 and on that visit radiation was discussed but surgery was not. Therefore, patient no showed to his appointment with Dr. Genevive Bi on 11/01/2017. However, there is no need to see him until Dr. Baruch Gouty or Dr. Grayland Ormond wants patient to have surgery after treatments.

## 2017-11-05 NOTE — Telephone Encounter (Signed)
-----   Message from [place], [place] sent at 10/16/2017  5:00 PM EDT ----- Regarding: RE: referral Make sure that he comes in on 11/01/2017 to discuss his appts with Dr. Donella Stade and Dr. Rockey Situ  ----- Message ----- From: Mickie Kay Sent: 10/11/2017  12:00 PM To: Wayna Chalet, CMA Subject: RE: referral                                   10/15/17 [place] at 9:00am ----- Message ----- From: Wayna Chalet, [place] Sent: 10/07/2017   9:08 AM To: Albin Felling Brouillard Subject: referral                                       Can you please refer patient to see a cardiologist ASAP for Aortic Atherosclerois. Referring Doc: Dr. Genevive Bi. Thank You!

## 2017-11-11 DIAGNOSIS — C3411 Malignant neoplasm of upper lobe, right bronchus or lung: Secondary | ICD-10-CM | POA: Diagnosis not present

## 2017-11-17 ENCOUNTER — Ambulatory Visit: Admission: RE | Admit: 2017-11-17 | Payer: Medicare Other | Source: Ambulatory Visit

## 2017-11-18 ENCOUNTER — Encounter: Payer: Self-pay | Admitting: *Deleted

## 2017-11-18 ENCOUNTER — Ambulatory Visit
Admission: RE | Admit: 2017-11-18 | Discharge: 2017-11-18 | Disposition: A | Discharge status: Home / Self Care | Payer: Medicare Other | Source: Ambulatory Visit | Attending: Radiation Oncology | Admitting: Radiation Oncology

## 2017-11-18 ENCOUNTER — Ambulatory Visit: Payer: Medicare Other

## 2017-11-18 ENCOUNTER — Ambulatory Visit: Admission: RE | Admit: 2017-11-18 | Payer: Medicare Other | Source: Ambulatory Visit

## 2017-11-18 DIAGNOSIS — C3411 Malignant neoplasm of upper lobe, right bronchus or lung: Secondary | ICD-10-CM | POA: Diagnosis not present

## 2017-11-18 NOTE — Progress Notes (Signed)
  Oncology Nurse Navigator Documentation  Navigator Location: [facility] (11/18/17 1300)   )Navigator Encounter Type: Treatment (11/18/17 1300)                   Treatment Initiated Date: 11/18/17 (11/18/17 1300) Patient Visit Type: EXHBZJ (11/18/17 1300) Treatment Phase: First Radiation Tx (11/18/17 1300) Barriers/Navigation Needs: No barriers at this time (11/18/17 1300)   Interventions: None required (11/18/17 1300)                      Time Spent with Patient: 15 (11/18/17 1300)

## 2017-11-20 ENCOUNTER — Ambulatory Visit
Admission: RE | Admit: 2017-11-20 | Discharge: 2017-11-20 | Disposition: A | Discharge status: Home / Self Care | Payer: Medicare Other | Source: Ambulatory Visit | Attending: Radiation Oncology | Admitting: Radiation Oncology

## 2017-11-20 DIAGNOSIS — C3411 Malignant neoplasm of upper lobe, right bronchus or lung: Secondary | ICD-10-CM | POA: Diagnosis not present

## 2017-11-22 ENCOUNTER — Ambulatory Visit: Payer: Medicare Other

## 2017-11-25 ENCOUNTER — Ambulatory Visit
Admission: RE | Admit: 2017-11-25 | Discharge: 2017-11-25 | Disposition: A | Discharge status: Home / Self Care | Payer: Medicare Other | Source: Ambulatory Visit | Attending: Radiation Oncology | Admitting: Radiation Oncology

## 2017-11-25 DIAGNOSIS — C3411 Malignant neoplasm of upper lobe, right bronchus or lung: Secondary | ICD-10-CM | POA: Diagnosis not present

## 2017-11-27 ENCOUNTER — Ambulatory Visit
Admission: RE | Admit: 2017-11-27 | Discharge: 2017-11-27 | Disposition: A | Discharge status: Home / Self Care | Payer: Medicare Other | Source: Ambulatory Visit | Attending: Radiation Oncology | Admitting: Radiation Oncology

## 2017-11-27 DIAGNOSIS — C3411 Malignant neoplasm of upper lobe, right bronchus or lung: Secondary | ICD-10-CM | POA: Diagnosis not present

## 2017-12-03 ENCOUNTER — Ambulatory Visit
Admission: RE | Admit: 2017-12-03 | Discharge: 2017-12-03 | Disposition: A | Discharge status: Home / Self Care | Payer: Medicare Other | Source: Ambulatory Visit | Attending: Radiation Oncology | Admitting: Radiation Oncology

## 2017-12-03 DIAGNOSIS — C3411 Malignant neoplasm of upper lobe, right bronchus or lung: Secondary | ICD-10-CM | POA: Diagnosis not present

## 2017-12-03 DIAGNOSIS — Z510 Encounter for antineoplastic radiation therapy: Secondary | ICD-10-CM | POA: Diagnosis not present

## 2017-12-04 ENCOUNTER — Ambulatory Visit: Payer: Medicare Other

## 2017-12-26 ENCOUNTER — Encounter: Payer: Self-pay | Admitting: *Deleted

## 2018-01-06 ENCOUNTER — Ambulatory Visit: Payer: Medicare Other | Admitting: Radiation Oncology

## 2018-01-09 ENCOUNTER — Encounter: Payer: Self-pay | Admitting: Radiation Oncology

## 2018-01-09 ENCOUNTER — Other Ambulatory Visit: Payer: Self-pay | Admitting: *Deleted

## 2018-01-09 ENCOUNTER — Ambulatory Visit
Admission: RE | Admit: 2018-01-09 | Discharge: 2018-01-09 | Disposition: A | Discharge status: Home / Self Care | Payer: Medicare Other | Source: Ambulatory Visit | Attending: Radiation Oncology | Admitting: Radiation Oncology

## 2018-01-09 ENCOUNTER — Other Ambulatory Visit: Payer: Self-pay

## 2018-01-09 VITALS — BP 126/66 | HR 55 | Temp 97.7°F | Resp 18 | Wt 159.8 lb

## 2018-01-09 DIAGNOSIS — C3411 Malignant neoplasm of upper lobe, right bronchus or lung: Secondary | ICD-10-CM | POA: Insufficient documentation

## 2018-01-09 NOTE — Progress Notes (Signed)
ct 

## 2018-01-09 NOTE — Progress Notes (Signed)
Radiation Oncology Follow up Note  Name: Samuel Carney   Date:   01/09/2018 MRN:  [id number] DOB: 07-05-1945    This 72 y.o. male presents to the clinic today for one-month follow-up status post SB RT for stage IA adenocarcinoma the right upper lobe.  REFERRING PROVIDER: Juluis Pitch, MD  HPI: patient is a 72 year old male now seen out 1 month having completed SB RT for stage I a adenocarcinoma the rent right upper lobe. Seen today in routine follow-up he is doing well. Specifically denies cough any change in pulmonary status fatigue or dysphagia..  COMPLICATIONS OF TREATMENT: none  FOLLOW UP COMPLIANCE: keeps appointments   PHYSICAL EXAM:  BP 126/66 (BP Location: Right Arm)   Pulse (!) 55   Temp 97.7 F (36.5 C) (Tympanic)   Resp 18   Wt 159 lb 13.3 oz (72.5 kg)   BMI 23.60 kg/m  Well-developed well-nourished patient in NAD. HEENT reveals PERLA, EOMI, discs not visualized.  Oral cavity is clear. No oral mucosal lesions are identified. Neck is clear without evidence of cervical or supraclavicular adenopathy. Lungs are clear to A&P. Cardiac examination is essentially unremarkable with regular rate and rhythm without murmur rub or thrill. Abdomen is benign with no organomegaly or masses noted. Motor sensory and DTR levels are equal and symmetric in the upper and lower extremities. Cranial nerves II through XII are grossly intact. Proprioception is intact. No peripheral adenopathy or edema is identified. No motor or sensory levels are noted. Crude visual fields are within normal range.  RADIOLOGY RESULTS: no current films for review CT scan ordered in 3 months  PLAN: present time patient is doing well with very little side effect profile from his SB RT. I am please was overall progress. I've ordered a follow-up in 3 months with a CT scan of his chest with contrast prior to that visit. Patient in the meantime knows to call with any concerns.  I would like to take this opportunity to  thank you for allowing me to participate in the care of your patient.Noreene Filbert, MD

## 2018-01-28 ENCOUNTER — Encounter
Admission: RE | Admit: 2018-01-28 | Discharge: 2018-01-28 | Disposition: A | Discharge status: Home / Self Care | Payer: Medicare Other | Source: Ambulatory Visit | Attending: Oncology | Admitting: Oncology

## 2018-01-28 DIAGNOSIS — C3411 Malignant neoplasm of upper lobe, right bronchus or lung: Secondary | ICD-10-CM | POA: Diagnosis present

## 2018-01-28 LAB — GLUCOSE, CAPILLARY: Glucose-Capillary: 92 mg/dL (ref 70–99)

## 2018-01-28 IMAGING — MR MR ABDOMEN WO/W CM MRCP
8 of 20 series · 14 of 48 positions shown · IV contrast (15mL MULTIHANCE)
Comparison: None

CLINICAL DATA: Evaluate liver cysts.

EXAM:
MRI ABDOMEN WITHOUT AND WITH CONTRAST (INCLUDING MRCP)
TECHNIQUE: Multiplanar multisequence MR imaging of the abdomen was performed
both before and after the administration of intravenous contrast.
Heavily T2-weighted images of the biliary and pancreatic ducts were
obtained, and three-dimensional MRCP images were rendered by post
processing.
CONTRAST:  15mL MULTIHANCE GADOBENATE DIMEGLUMINE 529 MG/ML IV SOLN

[Series 2: cor true fisp · coronal · 5.5mm · 0.78mm/px · 1 of 38 slices shown]
[im 1/38]
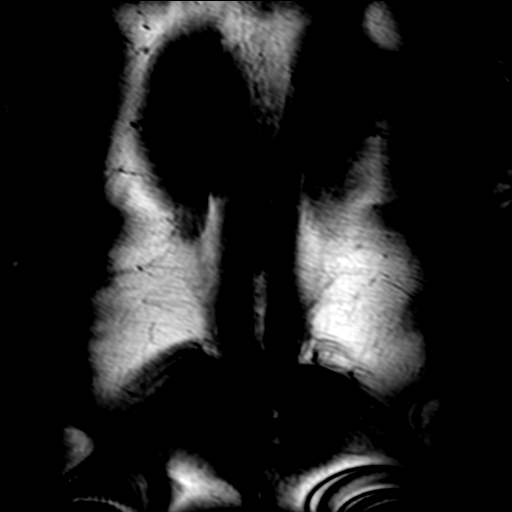

[Series 3: T2 fat-sat · axial · 7.0mm · 0.74mm/px · 1 of 28 slices shown]
[im 1/28]
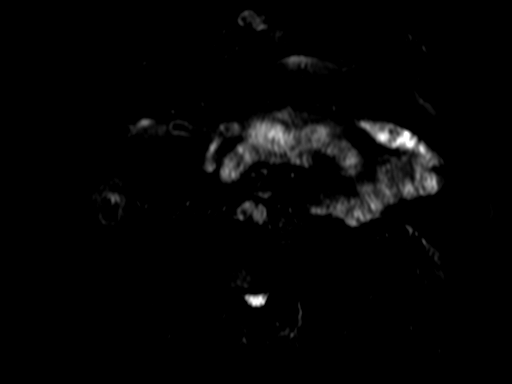

[Series 4: T2 · axial · 7.0mm · 1.48mm/px · 1 of 29 slices shown]
[im 1/29]
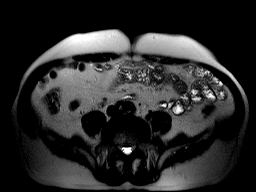

[Series 5: axial in-out of · axial · 7.0mm · 0.74mm/px · z∈[-58,+177]mm · 2 of 58 slices shown]
[im 1/58]
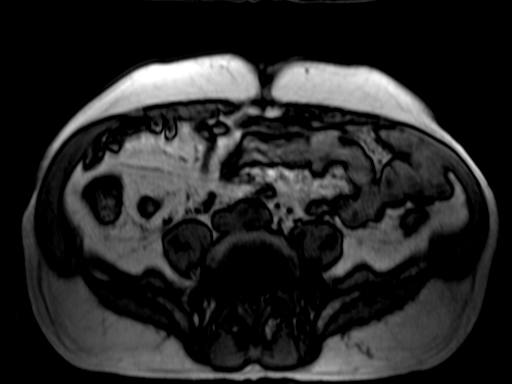
[im 58/58]
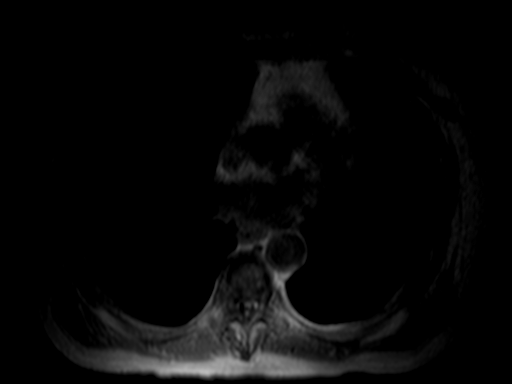

[Series 9: cor thins · coronal · 3.0mm · 0.74mm/px · 1 of 27 slices shown]
[im 1/27]
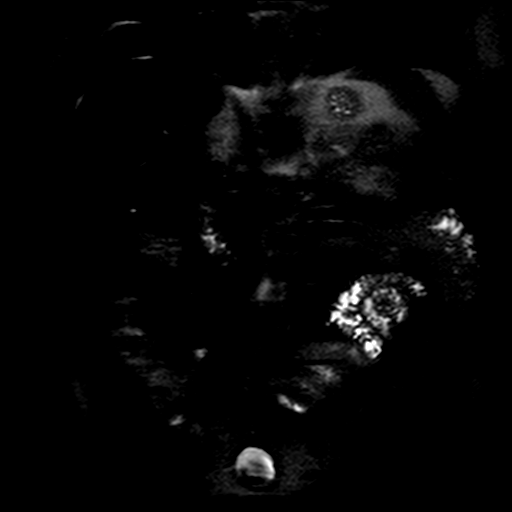

[Series 10: MRCP · coronal · 40.0mm · 0.91mm/px · 1 of 6 slices shown]
[im 1/6]
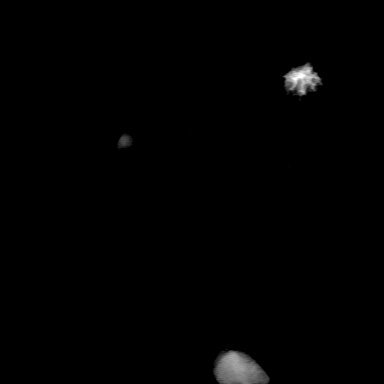

[Series 11: DWI · axial · 6.0mm · 2.73mm/px · z∈[-46,+163]mm · 5 of 87 slices shown]
[im 1/87]
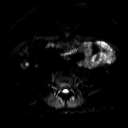
[im 22/87]
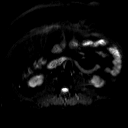
[im 44/87]
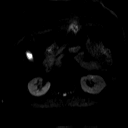
[im 65/87]
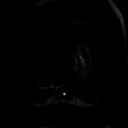
[im 87/87]
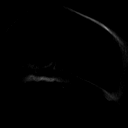

[Series 12: axial dwi_adc · axial · 6.0mm · 2.73mm/px · z∈[-46,+163]mm · 2 of 30 slices shown]
[im 1/30]
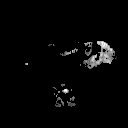
[im 30/30]
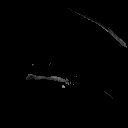

[14 of 48 positions shown; findings below may reference images not displayed]

FINDINGS: Lower chest: There is no pericardial or pleural effusion identified.

Hepatobiliary: Segment 5 simple appear cyst measures 11 mm, image
number 30 of series 16. Septated cyst within segment 6 measures
cm, image 40 of series 16. No suspicious imaging features
identified. No abnormal enhancing liver abnormalities identified.
The gallbladder appears within normal limits. No biliary dilatation.

Pancreas: No mass, inflammatory changes, or other parenchymal
abnormality identified.

Spleen:  Within normal limits in size and appearance.

Adrenals/Urinary Tract: The adrenal glands appear normal. Small
bilateral T2 hyperintense structures within both kidneys are
identified and likely represent simple cysts. There is a T1 and T2
hypointense structure with in the inferior pole of the right kidney.
This exhibits significant susceptibility artifact on the in-phase
and out of phase images and is favored to represent a densely
calcified nodule. Likely benign.

Stomach/Bowel: Visualized portions within the abdomen are
unremarkable.

Vascular/Lymphatic: Aortic atherosclerosis. Infrarenal abdominal
aortic aneurysm measures 2.9 cm, image 40 of series 14. No
pathologically enlarged lymph nodes identified.

Other:  No free fluid or fluid collections identified.

Musculoskeletal: No suspicious bone lesions identified.
IMPRESSION: 1. No suspicious liver abnormalities identified. There are 2 cysts
within the right lobe of liver, 1 of these is mildly complicated
with an internal area of septation.
2. Bilateral kidney cysts.
3. Lesion involving the inferior pole of the right kidney is
identified. This exhibits significant susceptibility artifact from
the inphase and out of phase sequences and is favored to represent a
calcified lesion. No definite enhancement. Imaging with a CT of the
kidneys is advised to confirm benignity.
4. Aortic Atherosclerosis (SE48A-A1J.J). The infrarenal abdominal
aorta measures 2.9 cm. Ectatic abdominal aorta at risk for aneurysm
development. Recommend followup by ultrasound in 5 years. This
recommendation follows ACR consensus guidelines: White Paper of the
ACR Incidental Findings Committee II on Vascular Findings. [HOSPITAL] 2856; [DATE].

## 2018-01-28 MED ORDER — FLUDEOXYGLUCOSE F - 18 (FDG) INJECTION
8.2000 | Freq: Once | INTRAVENOUS | Status: AC | PRN
  Administered 2018-01-28: 8.2 via INTRAVENOUS

## 2018-02-04 ENCOUNTER — Ambulatory Visit: Payer: Medicare Other | Admitting: Oncology

## 2018-04-07 ENCOUNTER — Ambulatory Visit: Payer: Medicare Other

## 2018-04-17 ENCOUNTER — Ambulatory Visit
Admission: RE | Admit: 2018-04-17 | Discharge: 2018-04-17 | Disposition: A | Discharge status: Home / Self Care | Payer: Medicare Other | Source: Ambulatory Visit | Attending: Radiation Oncology | Admitting: Radiation Oncology

## 2018-04-17 ENCOUNTER — Ambulatory Visit: Payer: Medicare Other | Admitting: Radiation Oncology

## 2018-04-17 DIAGNOSIS — C3411 Malignant neoplasm of upper lobe, right bronchus or lung: Secondary | ICD-10-CM | POA: Insufficient documentation

## 2018-04-17 HISTORY — DX: Malignant (primary) neoplasm, unspecified: C80.1

## 2018-04-17 LAB — POCT I-STAT CREATININE: Creatinine, Ser: 1.3 mg/dL — ABNORMAL HIGH (ref 0.61–1.24)

## 2018-04-17 MED ORDER — IOHEXOL 300 MG/ML  SOLN
75.0000 mL | Freq: Once | INTRAMUSCULAR | Status: AC | PRN
  Administered 2018-04-17: 75 mL via INTRAVENOUS

## 2018-10-13 ENCOUNTER — Other Ambulatory Visit: Payer: Self-pay | Admitting: Oncology

## 2018-10-13 DIAGNOSIS — C3491 Malignant neoplasm of unspecified part of right bronchus or lung: Secondary | ICD-10-CM

## 2018-10-26 NOTE — Progress Notes (Signed)
[place]  Telephone:(336) [phone] Fax:(336) [phone]  ID: Samuel Carney OB: [birth year]  MR#: [id number]  WPY#:[id number]  Patient Care Team: Juluis Pitch, MD as PCP - General (Family Medicine) Telford Nab, RN as Registered Nurse  CHIEF COMPLAINT: Clinical stage IA3 adenocarcinoma of the right upper lobe lung.  INTERVAL HISTORY: Patient returns to clinic today for further evaluation and discussion of his CT scan results.  He has residual tenderness from radiation, but otherwise feels well.  He has no neurologic complaints.  Patient denies any recent fevers or illnesses.  He has a good appetite and denies weight loss.  He has no chest pain, cough, shortness of breath, or hemoptysis.  He denies any nausea, vomiting, constipation, or diarrhea.  He has no urinary complaints.  Patient offers no further specific complaints today.  REVIEW OF SYSTEMS:   Review of Systems  Constitutional: Negative.  Negative for fever, malaise/fatigue and weight loss.  Respiratory: Negative.  Negative for cough, hemoptysis and shortness of breath.   Cardiovascular: Negative.  Negative for chest pain and leg swelling.  Gastrointestinal: Negative.  Negative for abdominal pain.  Genitourinary: Negative.  Negative for dysuria.  Musculoskeletal: Negative.  Negative for back pain.  Skin: Negative.  Negative for rash.  Neurological: Negative.  Negative for sensory change, focal weakness and weakness.  Psychiatric/Behavioral: Negative.  The patient is not nervous/anxious.     As per HPI. Otherwise, a complete review of systems is negative.  PAST MEDICAL HISTORY: Past Medical History:  Diagnosis Date  . History of kidney stones   . Hypertension   . Lung mass   . Squamous cell carcinoma of lung, right ([place])     PAST SURGICAL HISTORY: History reviewed. No pertinent surgical history.  FAMILY HISTORY: Family History  Problem Relation Age of Onset  . Stroke Brother   . Diabetes  Mellitus II Brother   . Diabetes Mellitus II Mother   . Diabetes Mellitus II Maternal Uncle     ADVANCED DIRECTIVES (Y/N):  N  HEALTH MAINTENANCE: Social History   Tobacco Use  . Smoking status: Current Every Day Smoker    Packs/day: 1.00    Years: 50.00    Pack years: 50.00    Types: Cigarettes  . Smokeless tobacco: Never Used  Substance Use Topics  . Alcohol use: Yes    Comment: "1 pint of liquor per day"  . Drug use: Never     Colonoscopy:  PAP:  Bone density:  Lipid panel:  No Known Allergies  Current Outpatient Medications  Medication Sig Dispense Refill  . aspirin EC 81 MG tablet Take 1 tablet by mouth daily.    . dorzolamide-timolol (COSOPT) 22.3-6.8 MG/ML ophthalmic solution Place 1 drop into both eyes every 8 (eight) hours.  6  . doxazosin (CARDURA) 2 MG tablet Take 2 mg by mouth daily.  1  . metoprolol tartrate (LOPRESSOR) 100 MG tablet Take 1 tablet by mouth 2 (two) times daily.    . potassium chloride SA (KLOR-CON M20) 20 MEQ tablet Take 1 tablet by mouth daily.    . rosuvastatin (CRESTOR) 5 MG tablet Take 1 tablet by mouth once daily 90 tablet 0  . telmisartan (MICARDIS) 80 MG tablet Take 1 tablet by mouth daily.    . timolol (TIMOPTIC) 0.25 % ophthalmic solution     . Travoprost, BAK Free, (TRAVATAN Z) 0.004 % SOLN ophthalmic solution once daily.    Marland Kitchen amLODipine (NORVASC) 10 MG tablet Take 1 tablet by mouth daily.    Marland Kitchen  traMADol (ULTRAM) 50 MG tablet Take 1 tablet by mouth every 4 (four) hours as needed for pain.     No current facility-administered medications for this visit.     OBJECTIVE: Vitals:   10/30/18 0947  BP: 124/68  Pulse: 64  Resp: 18  Temp: 97.6 F (36.4 C)  SpO2: 99%     Body mass index is 21.71 kg/m.    ECOG FS:0 - Asymptomatic  General: Well-developed, well-nourished, no acute distress. Eyes: Pink conjunctiva, anicteric sclera. HEENT: Normocephalic, moist mucous membranes. Lungs: Clear to auscultation bilaterally. Heart:  Regular rate and rhythm. No rubs, murmurs, or gallops. Abdomen: Soft, nontender, nondistended. No organomegaly noted, normoactive bowel sounds. Musculoskeletal: No edema, cyanosis, or clubbing. Neuro: Alert, answering all questions appropriately. Cranial nerves grossly intact. Skin: No rashes or petechiae noted. Psych: Normal affect.  LAB RESULTS:  Lab Results  Component Value Date   NA 136 12/28/2012   K 3.5 12/28/2012   CL 105 12/28/2012   CO2 26 12/28/2012   GLUCOSE 96 12/28/2012   BUN 14 12/28/2012   CREATININE 1.30 (H) 04/17/2018   CALCIUM 8.8 12/28/2012   PROT 6.9 12/28/2012   ALBUMIN 3.1 (L) 12/28/2012   AST 32 12/28/2012   ALT 16 12/28/2012   ALKPHOS 91 12/28/2012   BILITOT 1.0 12/28/2012   GFRNONAA >60 12/28/2012   GFRAA >60 12/28/2012    Lab Results  Component Value Date   WBC 4.0 10/14/2017   NEUTROABS 12.9 (H) 12/28/2012   HGB 12.0 (L) 10/14/2017   HCT 35.1 (L) 10/14/2017   MCV 101.8 (H) 10/14/2017   PLT 137 (L) 10/14/2017     STUDIES: Ct Chest W Contrast  Result Date: 10/28/2018 CLINICAL DATA:  Restaging small cell lung cancer EXAM: CT CHEST WITH CONTRAST TECHNIQUE: Multidetector CT imaging of the chest was performed during intravenous contrast administration. CONTRAST:  23mL OMNIPAQUE IOHEXOL 300 MG/ML  SOLN COMPARISON:  CT 04/17/2018 FINDINGS: Cardiovascular: Coronary artery calcification and aortic atherosclerotic calcification. Mediastinum/Nodes: No axillary or supraclavicular adenopathy. Stable cystic lesion in the LEFT lobe of thyroid gland measures 10 mm. LEFT lower paratracheal lymph node measuring 8 mm compares 6 mm. RIGHT lower paratracheal lymph node measures 10 mm compared with 7 mm. RIGHT hilar lymph node measures 13 mm unchanged from 13 mm. Esophagus normal. Lungs/Pleura: Again demonstrated post radiation change in the RIGHT upper lobe with a band of subpleural fibrotic thickening. The treated upper lobe nodule is similar in size measuring 12 mm x  14 mm compared to 21 mm x 18 mm. No new nodularity in the RIGHT lung. The LEFT lung is clear. 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  Airways normal. Upper Abdomen: Limited view of the liver, kidneys, pancreas are unremarkable. Normal adrenal glands. Musculoskeletal: Degenerative changes in the thoracic spine. No aggressive osseous lesion number number IMPRESSION: 1. Stable postradiation change in the RIGHT upper lobe. Stable to decrease in size of treated upper lobe nodule within the radiation field. 2. Lower paratracheal lymph nodes measures slightly mild larger. Recommend attention on follow-up. Electronically Signed   By: Suzy Bouchard M.D.   On: 10/28/2018 10:30    ASSESSMENT: Clinical stage IA3 adenocarcinoma of the right upper lobe lung.  PLAN:    1. 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 He has no other areas of hypermetabolism suspicious for metastatic disease.  Patient  was evaluated by thoracic surgery and determined not to be a surgical candidate.  Subsequent biopsy confirmed malignancy.  He completed XRT proximately 3 months ago.  CT scan results from October 28, 2018 reviewed independently and reported as above with no obvious evidence of recurrent or progressive disease.  No intervention is needed at this time.  Return to clinic in 3 months with repeat imaging and further evaluation.    I spent a total of 20 minutes face-to-face with the patient of which greater than 50% of the visit was spent in counseling and coordination of care as detailed above.   Patient expressed understanding and was in agreement with this plan. He also understands that He can call clinic at any time with any questions, concerns, or complaints.   Cancer Staging Lung cancer [facility]) Staging form: Lung, AJCC 8th Edition - Clinical stage from  10/21/2017: Stage IA3 (cT1c, cN0, cM0) - Signed by Lloyd Huger, MD on 10/21/2017   Lloyd Huger, MD   10/30/2018 10:43 AM

## 2018-10-28 ENCOUNTER — Other Ambulatory Visit: Payer: Self-pay

## 2018-10-28 ENCOUNTER — Ambulatory Visit
Admission: RE | Admit: 2018-10-28 | Discharge: 2018-10-28 | Disposition: A | Discharge status: Home / Self Care | Payer: Medicare Other | Source: Ambulatory Visit | Attending: Oncology | Admitting: Oncology

## 2018-10-28 DIAGNOSIS — C3491 Malignant neoplasm of unspecified part of right bronchus or lung: Secondary | ICD-10-CM | POA: Diagnosis not present

## 2018-10-28 HISTORY — DX: Malignant neoplasm of unspecified part of right bronchus or lung: C34.91

## 2018-10-28 MED ORDER — IOHEXOL 300 MG/ML  SOLN
75.0000 mL | Freq: Once | INTRAMUSCULAR | Status: AC | PRN
  Administered 2018-10-28: 09:00:00 75 mL via INTRAVENOUS

## 2018-10-29 ENCOUNTER — Ambulatory Visit (HOSPITAL_COMMUNITY): Payer: Medicare Other

## 2018-10-29 ENCOUNTER — Ambulatory Visit: Payer: Medicare Other

## 2018-10-29 ENCOUNTER — Other Ambulatory Visit: Payer: Self-pay | Admitting: Cardiovascular Disease

## 2018-10-30 ENCOUNTER — Inpatient Hospital Stay: Payer: Medicare Other | Attending: Oncology | Admitting: Oncology

## 2018-10-30 ENCOUNTER — Encounter: Payer: Self-pay | Admitting: Oncology

## 2018-10-30 ENCOUNTER — Other Ambulatory Visit: Payer: Self-pay

## 2018-10-30 VITALS — BP 124/68 | HR 64 | Temp 97.6°F | Resp 18 | Wt 147.0 lb

## 2018-10-30 DIAGNOSIS — E785 Hyperlipidemia, unspecified: Secondary | ICD-10-CM | POA: Insufficient documentation

## 2018-10-30 DIAGNOSIS — Z923 Personal history of irradiation: Secondary | ICD-10-CM | POA: Diagnosis not present

## 2018-10-30 DIAGNOSIS — F17210 Nicotine dependence, cigarettes, uncomplicated: Secondary | ICD-10-CM | POA: Insufficient documentation

## 2018-10-30 DIAGNOSIS — Z79899 Other long term (current) drug therapy: Secondary | ICD-10-CM | POA: Insufficient documentation

## 2018-10-30 DIAGNOSIS — C3411 Malignant neoplasm of upper lobe, right bronchus or lung: Secondary | ICD-10-CM | POA: Insufficient documentation

## 2018-10-30 DIAGNOSIS — I10 Essential (primary) hypertension: Secondary | ICD-10-CM | POA: Diagnosis not present

## 2018-10-30 DIAGNOSIS — Z833 Family history of diabetes mellitus: Secondary | ICD-10-CM | POA: Insufficient documentation

## 2018-10-30 DIAGNOSIS — Z7982 Long term (current) use of aspirin: Secondary | ICD-10-CM

## 2018-10-30 NOTE — Progress Notes (Signed)
Pt in for follow up reports appetite has improved and still has some pain to chest from radiation.

## 2018-10-31 ENCOUNTER — Ambulatory Visit: Payer: Medicare Other | Admitting: Oncology

## 2018-11-21 ENCOUNTER — Other Ambulatory Visit: Payer: Self-pay

## 2018-11-21 ENCOUNTER — Other Ambulatory Visit
Admission: RE | Admit: 2018-11-21 | Discharge: 2018-11-21 | Disposition: A | Discharge status: Home / Self Care | Payer: Medicare Other | Source: Ambulatory Visit | Attending: Gastroenterology | Admitting: Gastroenterology

## 2018-11-21 DIAGNOSIS — Z20828 Contact with and (suspected) exposure to other viral communicable diseases: Secondary | ICD-10-CM | POA: Insufficient documentation

## 2018-11-21 DIAGNOSIS — Z01812 Encounter for preprocedural laboratory examination: Secondary | ICD-10-CM | POA: Diagnosis not present

## 2018-11-21 LAB — SARS CORONAVIRUS 2 (TAT 6-24 HRS): SARS Coronavirus 2: NEGATIVE

## 2018-11-25 ENCOUNTER — Ambulatory Visit: Payer: Medicare Other | Admitting: Anesthesiology

## 2018-11-25 ENCOUNTER — Ambulatory Visit
Admission: RE | Admit: 2018-11-25 | Discharge: 2018-11-25 | Disposition: A | Discharge status: Home / Self Care | Payer: Medicare Other | Attending: Gastroenterology | Admitting: Gastroenterology

## 2018-11-25 ENCOUNTER — Encounter
Admission: RE | Disposition: A | Discharge status: Home / Self Care | Payer: Self-pay | Source: Home / Self Care | Attending: Gastroenterology

## 2018-11-25 ENCOUNTER — Encounter: Payer: Self-pay | Admitting: *Deleted

## 2018-11-25 ENCOUNTER — Other Ambulatory Visit: Payer: Self-pay

## 2018-11-25 DIAGNOSIS — K2270 Barrett's esophagus without dysplasia: Secondary | ICD-10-CM | POA: Insufficient documentation

## 2018-11-25 DIAGNOSIS — K449 Diaphragmatic hernia without obstruction or gangrene: Secondary | ICD-10-CM | POA: Insufficient documentation

## 2018-11-25 DIAGNOSIS — K259 Gastric ulcer, unspecified as acute or chronic, without hemorrhage or perforation: Secondary | ICD-10-CM | POA: Diagnosis not present

## 2018-11-25 DIAGNOSIS — Z85118 Personal history of other malignant neoplasm of bronchus and lung: Secondary | ICD-10-CM | POA: Insufficient documentation

## 2018-11-25 DIAGNOSIS — K31811 Angiodysplasia of stomach and duodenum with bleeding: Secondary | ICD-10-CM | POA: Diagnosis not present

## 2018-11-25 DIAGNOSIS — I868 Varicose veins of other specified sites: Secondary | ICD-10-CM | POA: Diagnosis not present

## 2018-11-25 DIAGNOSIS — K319 Disease of stomach and duodenum, unspecified: Secondary | ICD-10-CM | POA: Insufficient documentation

## 2018-11-25 DIAGNOSIS — Z6820 Body mass index (BMI) 20.0-20.9, adult: Secondary | ICD-10-CM | POA: Insufficient documentation

## 2018-11-25 DIAGNOSIS — Z79899 Other long term (current) drug therapy: Secondary | ICD-10-CM | POA: Diagnosis not present

## 2018-11-25 DIAGNOSIS — Z923 Personal history of irradiation: Secondary | ICD-10-CM | POA: Diagnosis not present

## 2018-11-25 DIAGNOSIS — K5730 Diverticulosis of large intestine without perforation or abscess without bleeding: Secondary | ICD-10-CM | POA: Diagnosis not present

## 2018-11-25 DIAGNOSIS — F17200 Nicotine dependence, unspecified, uncomplicated: Secondary | ICD-10-CM | POA: Diagnosis not present

## 2018-11-25 DIAGNOSIS — K2980 Duodenitis without bleeding: Secondary | ICD-10-CM | POA: Diagnosis not present

## 2018-11-25 DIAGNOSIS — D510 Vitamin B12 deficiency anemia due to intrinsic factor deficiency: Secondary | ICD-10-CM | POA: Insufficient documentation

## 2018-11-25 DIAGNOSIS — Z86010 Personal history of colonic polyps: Secondary | ICD-10-CM | POA: Insufficient documentation

## 2018-11-25 DIAGNOSIS — I2510 Atherosclerotic heart disease of native coronary artery without angina pectoris: Secondary | ICD-10-CM | POA: Insufficient documentation

## 2018-11-25 DIAGNOSIS — Z7982 Long term (current) use of aspirin: Secondary | ICD-10-CM | POA: Diagnosis not present

## 2018-11-25 DIAGNOSIS — K640 First degree hemorrhoids: Secondary | ICD-10-CM | POA: Diagnosis not present

## 2018-11-25 DIAGNOSIS — I10 Essential (primary) hypertension: Secondary | ICD-10-CM | POA: Insufficient documentation

## 2018-11-25 DIAGNOSIS — D123 Benign neoplasm of transverse colon: Secondary | ICD-10-CM | POA: Diagnosis not present

## 2018-11-25 DIAGNOSIS — R634 Abnormal weight loss: Secondary | ICD-10-CM | POA: Insufficient documentation

## 2018-11-25 HISTORY — PX: COLONOSCOPY WITH PROPOFOL: SHX5780

## 2018-11-25 HISTORY — PX: ESOPHAGOGASTRODUODENOSCOPY (EGD) WITH PROPOFOL: SHX5813

## 2018-11-25 SURGERY — ESOPHAGOGASTRODUODENOSCOPY (EGD) WITH PROPOFOL
Anesthesia: General

## 2018-11-25 MED ORDER — LIDOCAINE HCL (PF) 2 % IJ SOLN
INTRAMUSCULAR | Status: AC
  Filled 2018-11-25: qty 10

## 2018-11-25 MED ORDER — PHENYLEPHRINE HCL (PRESSORS) 10 MG/ML IV SOLN
INTRAVENOUS | Status: DC | PRN
  Administered 2018-11-25 (×2): 50 ug via INTRAVENOUS

## 2018-11-25 MED ORDER — PHENYLEPHRINE HCL (PRESSORS) 10 MG/ML IV SOLN
INTRAVENOUS | Status: AC
  Filled 2018-11-25: qty 1

## 2018-11-25 MED ORDER — MIDAZOLAM HCL 2 MG/2ML IJ SOLN
INTRAMUSCULAR | Status: DC | PRN
  Administered 2018-11-25 (×2): 1 mg via INTRAVENOUS

## 2018-11-25 MED ORDER — EPHEDRINE SULFATE 50 MG/ML IJ SOLN
INTRAMUSCULAR | Status: DC | PRN
  Administered 2018-11-25 (×2): 10 mg via INTRAVENOUS

## 2018-11-25 MED ORDER — MIDAZOLAM HCL 2 MG/2ML IJ SOLN
INTRAMUSCULAR | Status: AC
  Filled 2018-11-25: qty 2

## 2018-11-25 MED ORDER — SODIUM CHLORIDE 0.9 % IV SOLN
INTRAVENOUS | Status: DC
  Administered 2018-11-25: 1000 mL via INTRAVENOUS

## 2018-11-25 MED ORDER — FENTANYL CITRATE (PF) 100 MCG/2ML IJ SOLN
INTRAMUSCULAR | Status: AC
  Filled 2018-11-25: qty 2

## 2018-11-25 MED ORDER — FENTANYL CITRATE (PF) 100 MCG/2ML IJ SOLN
INTRAMUSCULAR | Status: DC | PRN
  Administered 2018-11-25 (×2): 50 ug via INTRAVENOUS

## 2018-11-25 MED ORDER — PROPOFOL 500 MG/50ML IV EMUL
INTRAVENOUS | Status: DC | PRN
  Administered 2018-11-25: 120 ug/kg/min via INTRAVENOUS

## 2018-11-25 MED ORDER — PROPOFOL 500 MG/50ML IV EMUL
INTRAVENOUS | Status: AC
  Filled 2018-11-25: qty 50

## 2018-11-25 MED ORDER — LIDOCAINE HCL (CARDIAC) PF 100 MG/5ML IV SOSY
PREFILLED_SYRINGE | INTRAVENOUS | Status: DC | PRN
  Administered 2018-11-25: 50 mg via INTRAVENOUS

## 2018-11-25 MED ORDER — EPHEDRINE SULFATE 50 MG/ML IJ SOLN
INTRAMUSCULAR | Status: AC
  Filled 2018-11-25: qty 1

## 2018-11-25 NOTE — H&P (Signed)
Outpatient short stay form Pre-procedure 11/25/2018 8:43 AM Lollie Sails MD  Primary Physician: Juluis Pitch, MD  Reason for visit: EGD and colonoscopy  History of present illness: Patient is a 73 year old male presenting today for EGD and colonoscopy in regards to history of weight loss.  He has a personal history of colon polyps and his last colonoscopy was 06/26/2012.  He had several tubular adenomas removed at that time.  About a year ago he was diagnosed with lung cancer and has undergone radiation treatment.  He also has been increasing his alcohol intake macrocytic anemia.  EGD for further evaluation.  Tolerated his prep well.  He takes 81 mg aspirin daily but no other blood thinning agent or other aspirin product.    Current Facility-Administered Medications:  .  0.9 %  sodium chloride infusion, , Intravenous, Continuous, Lollie Sails, MD, Last Rate: 20 mL/hr at 11/25/18 0823, 1,000 mL at 11/25/18 9379  Medications Prior to Admission  Medication Sig Dispense Refill Last Dose  . amLODipine (NORVASC) 10 MG tablet Take 1 tablet by mouth daily.   11/25/2018 at Unknown time  . dorzolamide-timolol (COSOPT) 22.3-6.8 MG/ML ophthalmic solution Place 1 drop into both eyes every 8 (eight) hours.  6 11/25/2018 at Unknown time  . doxazosin (CARDURA) 2 MG tablet Take 2 mg by mouth daily.  1 11/25/2018 at Unknown time  . metoprolol tartrate (LOPRESSOR) 100 MG tablet Take 1 tablet by mouth 2 (two) times daily.   11/25/2018 at Unknown time  . potassium chloride SA (KLOR-CON M20) 20 MEQ tablet Take 1 tablet by mouth daily.   11/25/2018 at Unknown time  . rosuvastatin (CRESTOR) 5 MG tablet Take 1 tablet by mouth once daily 90 tablet 0 11/25/2018 at Unknown time  . timolol (TIMOPTIC) 0.25 % ophthalmic solution    11/25/2018 at Unknown time  . Travoprost, BAK Free, (TRAVATAN Z) 0.004 % SOLN ophthalmic solution once daily.   11/25/2018 at Unknown time  . aspirin EC 81 MG tablet Take 1 tablet by mouth  daily.     Marland Kitchen telmisartan (MICARDIS) 80 MG tablet Take 1 tablet by mouth daily.     . traMADol (ULTRAM) 50 MG tablet Take 1 tablet by mouth every 4 (four) hours as needed for pain.        No Known Allergies   Past Medical History:  Diagnosis Date  . History of kidney stones   . Hypertension   . Lung mass   . Squamous cell carcinoma of lung, right (HCC)     Review of systems:      Physical Exam    Heart and lungs: Regular rate and rhythm without rub or gallop lungs are bilaterally clear    HEENT: Normocephalic atraumatic eyes are anicteric    Other:    Pertinant exam for procedure: Soft nontender nondistended bowel sounds positive normoactive    Planned proceedures: EGD, colonoscopy and indicated procedures. I have discussed the risks benefits and complications of procedures to include not limited to bleeding, infection, perforation and the risk of sedation and the patient wishes to proceed.    Lollie Sails, MD Gastroenterology 11/25/2018  8:43 AM

## 2018-11-25 NOTE — Op Note (Signed)
[facility] Gastroenterology Patient Name: Samuel Carney Procedure Date: 11/25/2018 8:31 AM MRN: [id number] Account #: [id number] Date of Birth: [birth year] Admit Type: Outpatient Age: 73 Room: [facility] ENDO ROOM 3 Gender: Male Note Status: Finalized Procedure:            Upper GI endoscopy Indications:          Pernicious anemia Providers:            Lollie Sails, MD Referring MD:         Youlanda Roys. Lovie Macadamia, MD (Referring MD) Medicines:            Monitored Anesthesia Care Complications:        No immediate complications. Procedure:            Pre-Anesthesia Assessment:                       - ASA Grade Assessment: III - A patient with severe                        systemic disease.                       After obtaining informed consent, the endoscope was                        passed under direct vision. Throughout the procedure,                        the patient's blood pressure, pulse, and oxygen                        saturations were monitored continuously. The Endoscope                        was introduced through the mouth, and advanced to the                        third part of duodenum. The upper GI endoscopy was                        accomplished without difficulty. The patient tolerated                        the procedure well. Findings:      The Z-line was irregular. Biopsies were taken with a cold forceps for       histology.      There is no evidence of esophageal varices.      The exam of the esophagus was otherwise normal.      Patchy mild inflammation characterized by adherent blood and congestion       (edema) was found in the gastric body.      A few 1 to 2 mm angioectasias with bleeding were found in the gastric       body. Coagulation for tissue destruction using argon beam at maximun of       (used less than) 0.8 liters/minute and 30 watts was successful.      Biopsies were taken with a cold forceps in the gastric body and in the      gastric antrum for histology.      close evaluation of the gastric wall notes  possible varices, deep to the       mucosa on the greater curvature.      Patchy severe inflammation characterized by congestion (edema),       erosions, erythema and shallow ulcerations was found in the duodenal       bulb and in the second portion of the duodenum. The third portion of the       duodenum is free of inflamation and ulceration.      The cardia and gastric fundus were normal on retroflexion otherwise.      A small hiatal hernia was present. Impression:           - Z-line irregular. Biopsied.                       - Gastritis.                       - A few bleeding angioectasias in the stomach. Treated                        with argon beam coagulation.                       - Erosive duodenitis.                       - Biopsies were taken with a cold forceps for histology                        in the gastric body and in the gastric antrum. Recommendation:       - Use Protonix (pantoprazole) 40 mg PO BID daily.                       - Use sucralfate tablets 1 gram PO QID for 8 weeks.                       - Repeat upper endoscopy in 8 weeks to check healing.                       - Return to GI clinic in 3 weeks. Procedure Code(s):    --- Professional ---                       [phone], Esophagogastroduodenoscopy, flexible, transoral;                        with biopsy, single or multiple Diagnosis Code(s):    --- Professional ---                       K22.8, Other specified diseases of esophagus                       K29.70, Gastritis, unspecified, without bleeding                       K31.811, Angiodysplasia of stomach and duodenum with                        bleeding  K29.80, Duodenitis without bleeding                       D51.0, Vitamin B12 deficiency anemia due to intrinsic                        factor deficiency CPT copyright 2019 American Medical Association. All  rights reserved. The codes documented in this report are preliminary and upon coder review may  be revised to meet current compliance requirements. Lollie Sails, MD 11/25/2018 9:40:13 AM This report has been signed electronically. Number of Addenda: 0 Note Initiated On: 11/25/2018 8:31 AM      [facility]

## 2018-11-25 NOTE — Anesthesia Procedure Notes (Signed)
Performed by: [place], [place] Pre-anesthesia Checklist: Patient identified, Emergency Drugs available, Suction available, Patient being monitored and Timeout performed Patient Re-evaluated:Patient Re-evaluated prior to induction Oxygen Delivery Method: Nasal cannula Preoxygenation: Pre-oxygenation with 100% oxygen Induction Type: IV induction Airway Equipment and Method: Bite block Placement Confirmation: CO2 detector and positive ETCO2       

## 2018-11-25 NOTE — Anesthesia Post-op Follow-up Note (Signed)
Anesthesia QCDR form completed.        

## 2018-11-25 NOTE — Op Note (Signed)
[facility] Gastroenterology Patient Name: Samuel Carney Procedure Date: 11/25/2018 8:30 AM MRN: [id number] Account #: [id number] Date of Birth: [birth year] Admit Type: Outpatient Age: 73 Room: [facility] ENDO ROOM 3 Gender: Male Note Status: Finalized Procedure:            Colonoscopy Indications:          Personal history of colonic polyps Providers:            Lollie Sails, MD Medicines:            Monitored Anesthesia Care Complications:        No immediate complications. Procedure:            Pre-Anesthesia Assessment:                       - ASA Grade Assessment: III - A patient with severe                        systemic disease.                       After obtaining informed consent, the colonoscope was                        passed under direct vision. Throughout the procedure,                        the patient's blood pressure, pulse, and oxygen                        saturations were monitored continuously. The                        Colonoscope was introduced through the anus and                        advanced to the the cecum, identified by appendiceal                        orifice and ileocecal valve. The colonoscopy was                        unusually difficult due to poor bowel prep. Successful                        completion of the procedure was aided by lavage. Findings:      A few medium-mouthed diverticula were found in the sigmoid colon and       distal descending colon.      A 3 mm polyp was found in the transverse colon. The polyp was sessile.       The polyp was removed with a cold biopsy forceps. Resection and       retrieval were complete.      A 10 mm polyp was found in the distal transverse colon. The polyp was       sessile. The polyp was removed with a cold snare. Resection and       retrieval were complete.      3 to 6 mm, non-bleeding rectal varices were found.      vascular fragility noted in the cecum/proximal ascending  colon, mild  barotrauma. Mukltiple smal varicosities noted in this area deep to the       mucosa.      No additional abnormalities were found on retroflexion.      Non-bleeding internal hemorrhoids were found during retroflexion. The       hemorrhoids were medium-sized and Grade I (internal hemorrhoids that do       not prolapse). Impression:           - Diverticulosis in the sigmoid colon and in the distal                        descending colon.                       - One 3 mm polyp in the transverse colon, removed with                        a cold biopsy forceps. Resected and retrieved.                       - One 10 mm polyp in the distal transverse colon,                        removed with a cold snare. Resected and retrieved.                       - Rectal varices.                       - Non-bleeding internal hemorrhoids. Recommendation:       - Await pathology results.                       - obtain abdominal ultrasound with dopplers of portal                        splenic and hepatic veins, consider low dose nadolol                        for evidence of portal hypertension                       - Return to GI clinic in 3 weeks. Procedure Code(s):    --- Professional ---                       [phone], Colonoscopy, flexible; with removal of tumor(s),                        polyp(s), or other lesion(s) by snare technique                       [id number], 75, Colonoscopy, flexible; with biopsy, single                        or multiple Diagnosis Code(s):    --- Professional ---                       K64.0, First degree hemorrhoids                       K63.5, Polyp of  colon                       Z86.010, Personal history of colonic polyps                       K57.30, Diverticulosis of large intestine without                        perforation or abscess without bleeding CPT copyright 2019 American Medical Association. All rights reserved. The codes documented in this report  are preliminary and upon coder review may  be revised to meet current compliance requirements. Lollie Sails, MD 11/25/2018 10:14:42 AM This report has been signed electronically. Number of Addenda: 0 Note Initiated On: 11/25/2018 8:30 AM Scope Withdrawal Time: 0 hours 5 minutes 37 seconds  Total Procedure Duration: 0 hours 23 minutes 53 seconds       [facility]

## 2018-11-25 NOTE — Transfer of Care (Signed)
Immediate Anesthesia Transfer of Care Note  Patient: Samuel Carney  Procedure(s) Performed: ESOPHAGOGASTRODUODENOSCOPY (EGD) WITH PROPOFOL (N/A ) COLONOSCOPY WITH PROPOFOL (N/A )  Patient Location: PACU  Anesthesia Type:General  Level of Consciousness: sedated  Airway & Oxygen Therapy: Patient Spontanous Breathing and Patient connected to nasal cannula oxygen  Post-op Assessment: Report given to RN and Post -op Vital signs reviewed and stable  Post vital signs: Reviewed and stable  Last Vitals:  Vitals Value Taken Time  BP 93/55 11/25/18 1011  Temp 36.3 C 11/25/18 1011  Pulse 73 11/25/18 1011  Resp 32 11/25/18 1011  SpO2 95 % 11/25/18 1011  Vitals shown include unvalidated device data.  Last Pain:  Vitals:   11/25/18 1011  TempSrc:   PainSc: 0-No pain         Complications: No apparent anesthesia complications

## 2018-11-25 NOTE — Anesthesia Preprocedure Evaluation (Signed)
Anesthesia Evaluation  Patient identified by MRN, date of birth, ID band Patient awake    Reviewed: Allergy & Precautions, H&P , NPO status , Patient's Chart, lab work & pertinent test results, reviewed documented beta blocker date and time   Airway Mallampati: II   Neck ROM: full    Dental  (+) Poor Dentition   Pulmonary neg pulmonary ROS, Current Smoker,    Pulmonary exam normal        Cardiovascular Exercise Tolerance: Good hypertension, On Medications + CAD  Normal cardiovascular exam Rhythm:regular Rate:Normal     Neuro/Psych negative neurological ROS  negative psych ROS   GI/Hepatic negative GI ROS, Neg liver ROS,   Endo/Other  negative endocrine ROS  Renal/GU negative Renal ROS  negative genitourinary   Musculoskeletal   Abdominal   Peds  Hematology negative hematology ROS (+)   Anesthesia Other Findings Past Medical History: No date: History of kidney stones No date: Hypertension No date: Lung mass No date: Squamous cell carcinoma of lung, right (HCC) Past Surgical History: No date: COLONOSCOPY WITH PROPOFOL BMI    Body Mass Index: 20.50 kg/m     Reproductive/Obstetrics negative OB ROS                             Anesthesia Physical Anesthesia Plan  ASA: III  Anesthesia Plan: General   Post-op Pain Management:    Induction:   PONV Risk Score and Plan:   Airway Management Planned:   Additional Equipment:   Intra-op Plan:   Post-operative Plan:   Informed Consent: I have reviewed the patients History and Physical, chart, labs and discussed the procedure including the risks, benefits and alternatives for the proposed anesthesia with the patient or authorized representative who has indicated his/her understanding and acceptance.     Dental Advisory Given  Plan Discussed with: CRNA  Anesthesia Plan Comments:         Anesthesia Quick Evaluation

## 2018-11-26 ENCOUNTER — Encounter: Payer: Self-pay | Admitting: Gastroenterology

## 2018-11-26 LAB — SURGICAL PATHOLOGY

## 2018-11-28 ENCOUNTER — Other Ambulatory Visit: Payer: Self-pay | Admitting: Gastroenterology

## 2018-11-28 DIAGNOSIS — R634 Abnormal weight loss: Secondary | ICD-10-CM

## 2018-11-28 DIAGNOSIS — D696 Thrombocytopenia, unspecified: Secondary | ICD-10-CM

## 2018-11-28 DIAGNOSIS — F1010 Alcohol abuse, uncomplicated: Secondary | ICD-10-CM

## 2018-11-28 DIAGNOSIS — D649 Anemia, unspecified: Secondary | ICD-10-CM

## 2018-11-28 NOTE — Anesthesia Postprocedure Evaluation (Signed)
Anesthesia Post Note  Patient: Samuel Carney  Procedure(s) Performed: ESOPHAGOGASTRODUODENOSCOPY (EGD) WITH PROPOFOL (N/A ) COLONOSCOPY WITH PROPOFOL (N/A )  Patient location during evaluation: PACU Anesthesia Type: General Level of consciousness: awake and alert Pain management: pain level controlled Vital Signs Assessment: post-procedure vital signs reviewed and stable Respiratory status: spontaneous breathing, nonlabored ventilation, respiratory function stable and patient connected to nasal cannula oxygen Cardiovascular status: blood pressure returned to baseline and stable Postop Assessment: no apparent nausea or vomiting Anesthetic complications: no     Last Vitals:  Vitals:   11/25/18 1011 11/25/18 1050  BP: (!) 93/55 118/71  Pulse: 75 (!) 56  Resp: (!) 36 14  Temp: (!) 36.3 C   SpO2: 95% 99%    Last Pain:  Vitals:   11/26/18 0731  TempSrc:   PainSc: 0-No pain                 Molli Barrows

## 2018-12-12 ENCOUNTER — Other Ambulatory Visit: Payer: Self-pay

## 2018-12-12 ENCOUNTER — Ambulatory Visit
Admission: RE | Admit: 2018-12-12 | Discharge: 2018-12-12 | Disposition: A | Discharge status: Home / Self Care | Payer: Medicare Other | Source: Ambulatory Visit | Attending: Gastroenterology | Admitting: Gastroenterology

## 2018-12-12 DIAGNOSIS — D696 Thrombocytopenia, unspecified: Secondary | ICD-10-CM | POA: Insufficient documentation

## 2018-12-12 DIAGNOSIS — D649 Anemia, unspecified: Secondary | ICD-10-CM | POA: Insufficient documentation

## 2018-12-12 DIAGNOSIS — R634 Abnormal weight loss: Secondary | ICD-10-CM | POA: Diagnosis present

## 2018-12-12 DIAGNOSIS — F1010 Alcohol abuse, uncomplicated: Secondary | ICD-10-CM | POA: Diagnosis present

## 2019-01-29 ENCOUNTER — Other Ambulatory Visit: Payer: Self-pay | Admitting: Cardiovascular Disease

## 2019-01-29 ENCOUNTER — Other Ambulatory Visit: Payer: Self-pay

## 2019-01-29 ENCOUNTER — Ambulatory Visit
Admission: RE | Admit: 2019-01-29 | Discharge: 2019-01-29 | Disposition: A | Discharge status: Home / Self Care | Payer: Medicare Other | Source: Ambulatory Visit | Attending: Oncology | Admitting: Oncology

## 2019-01-29 DIAGNOSIS — C3411 Malignant neoplasm of upper lobe, right bronchus or lung: Secondary | ICD-10-CM | POA: Insufficient documentation

## 2019-01-29 MED ORDER — IOHEXOL 300 MG/ML  SOLN
75.0000 mL | Freq: Once | INTRAMUSCULAR | Status: AC | PRN
  Administered 2019-01-29: 75 mL via INTRAVENOUS

## 2019-01-30 ENCOUNTER — Ambulatory Visit: Payer: Medicare Other

## 2019-01-30 NOTE — Telephone Encounter (Signed)
Please schedule F/U with Dr. Rockey Situ for refills. Thank you!

## 2019-02-01 NOTE — Progress Notes (Deleted)
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  INTERVAL HISTORY: Patient returns to clinic today for further evaluation and discussion of his CT scan results.  He has residual tenderness from radiation, but otherwise feels well.  He has no neurologic complaints.  Patient denies any recent fevers or illnesses.  He has a good appetite and denies weight loss.  He has no chest pain, cough, shortness of breath, or hemoptysis.  He denies any nausea, vomiting, constipation, or diarrhea.  He has no urinary complaints.  Patient offers no further specific complaints today.  REVIEW OF SYSTEMS:   Review of Systems  Constitutional: Negative.  Negative for fever, malaise/fatigue and weight loss.  Respiratory: Negative.  Negative for cough, hemoptysis and shortness of breath.   Cardiovascular: Negative.  Negative for chest pain and leg swelling.  Gastrointestinal: Negative.  Negative for abdominal pain.  Genitourinary: Negative.  Negative for dysuria.  Musculoskeletal: Negative.  Negative for back pain.  Skin: Negative.  Negative for rash.  Neurological: Negative.  Negative for sensory change, focal weakness and weakness.  Psychiatric/Behavioral: Negative.  The patient is not nervous/anxious.     As per HPI. Otherwise, a complete review of systems is negative.  PAST MEDICAL HISTORY: Past Medical History:  Diagnosis Date  . History of kidney stones   . Hypertension   . Lung mass   . Squamous cell carcinoma of lung, right ([place])     PAST SURGICAL HISTORY: Past Surgical History:  Procedure Laterality Date  . COLONOSCOPY WITH PROPOFOL    . COLONOSCOPY WITH PROPOFOL N/A 11/25/2018   Procedure: COLONOSCOPY  WITH PROPOFOL;  Surgeon: Lollie Sails, MD;  Location[facility] ENDOSCOPY;  Service: Endoscopy;  Laterality: N/A;  . ESOPHAGOGASTRODUODENOSCOPY (EGD) WITH PROPOFOL N/A 11/25/2018   Procedure: ESOPHAGOGASTRODUODENOSCOPY (EGD) WITH PROPOFOL;  Surgeon: Lollie Sails, MD;  Location[facility] ENDOSCOPY;  Service: Endoscopy;  Laterality: N/A;    FAMILY HISTORY: Family History  Problem Relation Age of Onset  . Stroke Brother   . Diabetes Mellitus II Brother   . Diabetes Mellitus II Mother   . Diabetes Mellitus II Maternal Uncle     ADVANCED DIRECTIVES (Y/N):  N  HEALTH MAINTENANCE: Social History   Tobacco Use  . Smoking status: Current Every Day Smoker    Packs/day: 1.00    Years: 50.00    Pack years: 50.00    Types: Cigarettes  . Smokeless tobacco: Never Used  Substance Use Topics  . Alcohol use: Yes    Comment: "1 pint of liquor per day"  . Drug use: Never     Colonoscopy:  PAP:  Bone density:  Lipid panel:  No Known Allergies  Current Outpatient Medications  Medication Sig Dispense Refill  . amLODipine (NORVASC) 10 MG tablet Take 1 tablet by mouth daily.    Marland Kitchen aspirin EC 81 MG tablet Take 1 tablet by mouth daily.    . dorzolamide-timolol (COSOPT) 22.3-6.8 MG/ML ophthalmic solution Place 1 drop into both eyes every 8 (eight) hours.  6  . doxazosin (CARDURA) 2 MG tablet Take 2 mg by mouth daily.  1  . metoprolol tartrate (LOPRESSOR) 100 MG tablet Take 1 tablet by mouth 2 (two) times daily.    . potassium chloride SA (KLOR-CON  M20) 20 MEQ tablet Take 1 tablet by mouth daily.    . rosuvastatin (CRESTOR) 5 MG tablet Take 1 tablet by mouth once daily 90 tablet 0  . telmisartan (MICARDIS) 80 MG tablet Take 1 tablet by mouth daily.    . timolol (TIMOPTIC) 0.25 % ophthalmic solution     . traMADol (ULTRAM) 50 MG tablet Take 1 tablet by mouth every 4 (four) hours as needed for pain.    . Travoprost, BAK Free, (TRAVATAN Z) 0.004 % SOLN ophthalmic solution once daily.     No  current facility-administered medications for this visit.     OBJECTIVE: There were no vitals filed for this visit.   There is no height or weight on file to calculate BMI.    ECOG FS:0 - Asymptomatic  General: Well-developed, well-nourished, no acute distress. Eyes: Pink conjunctiva, anicteric sclera. HEENT: Normocephalic, moist mucous membranes. Lungs: Clear to auscultation bilaterally. Heart: Regular rate and rhythm. No rubs, murmurs, or gallops. Abdomen: Soft, nontender, nondistended. No organomegaly noted, normoactive bowel sounds. Musculoskeletal: No edema, cyanosis, or clubbing. Neuro: Alert, answering all questions appropriately. Cranial nerves grossly intact. Skin: No rashes or petechiae noted. Psych: Normal affect.  LAB RESULTS:  Lab Results  Component Value Date   NA 136 12/28/2012   K 3.5 12/28/2012   CL 105 12/28/2012   CO2 26 12/28/2012   GLUCOSE 96 12/28/2012   BUN 14 12/28/2012   CREATININE 1.30 (H) 04/17/2018   CALCIUM 8.8 12/28/2012   PROT 6.9 12/28/2012   ALBUMIN 3.1 (L) 12/28/2012   AST 32 12/28/2012   ALT 16 12/28/2012   ALKPHOS 91 12/28/2012   BILITOT 1.0 12/28/2012   GFRNONAA >60 12/28/2012   GFRAA >60 12/28/2012    Lab Results  Component Value Date   WBC 4.0 10/14/2017   NEUTROABS 12.9 (H) 12/28/2012   HGB 12.0 (L) 10/14/2017   HCT 35.1 (L) 10/14/2017   MCV 101.8 (H) 10/14/2017   PLT 137 (L) 10/14/2017     STUDIES: Ct Chest W Contrast  Result Date: 01/29/2019 CLINICAL DATA:  Lung cancer restaging EXAM: CT CHEST WITH CONTRAST TECHNIQUE: Multidetector CT imaging of the chest was performed during intravenous contrast administration. CONTRAST:  32mL OMNIPAQUE IOHEXOL 300 MG/ML  SOLN COMPARISON:  10/28/2018, 04/17/2018, PET-CT, 01/28/2018 FINDINGS: Cardiovascular: Severe mixed aortic atherosclerosis. Aortic valve calcifications. Mild cardiomegaly. Three-vessel coronary artery calcifications. No pericardial effusion. Mediastinum/Nodes: Stable  prominent pretracheal and mediastinal lymph nodes (series 2, image 55). Unchanged low-attenuation 1.0 cm nodule of the left lobe of thyroid. Trachea, and esophagus demonstrate no significant findings. Lungs/Pleura: Mild centrilobular emphysema. Stable appearance of post radiation fibrosis and consolidation of the anterior right lung apex, which almost completely obscures a nodule more discretely appreciated on prior examinations, measuring approximately 1.3 x 1.1 cm (series 3, image 31). No pleural effusion or pneumothorax. Upper Abdomen: No acute abnormality. Musculoskeletal: No chest wall mass or suspicious bone lesions identified. Disc degenerative disease and osteophytosis of the thoracic spine. IMPRESSION: 1. Stable appearance of post radiation fibrosis and consolidation of the anterior right lung apex, which almost completely obscures a nodule more discretely appreciated on prior examinations, measuring approximately 1.3 x 1.1 cm (series 3, image 31). 2. Stable prominent pretracheal and mediastinal lymph nodes (series 2, image 55). 3.  Emphysema (ICD10-J43.9). 4. Coronary artery disease. Severe mixed aortic atherosclerosis. Aortic Atherosclerosis (ICD10-I70.0). Electronically Signed   By: Eddie Candle M.D.   On: 01/29/2019 09:29    ASSESSMENT: Clinical stage IA3 adenocarcinoma of the right upper  lobe lung.  PLAN:    1. 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 He has no other areas of hypermetabolism suspicious for metastatic disease.  Patient was evaluated by thoracic surgery and determined not to be a surgical candidate.  Subsequent biopsy confirmed malignancy.  He completed XRT proximately 3 months ago.  CT scan results from October 28, 2018 reviewed independently and reported as above with no obvious evidence of recurrent or progressive  disease.  No intervention is needed at this time.  Return to clinic in 3 months with repeat imaging and further evaluation.    I spent a total of 20 minutes face-to-face with the patient of which greater than 50% of the visit was spent in counseling and coordination of care as detailed above.   Patient expressed understanding and was in agreement with this plan. He also understands that He can call clinic at any time with any questions, concerns, or complaints.   Cancer Staging Lung cancer [facility]) Staging form: Lung, AJCC 8th Edition - Clinical stage from 10/21/2017: Stage IA3 (cT1c, cN0, cM0) - Signed by Lloyd Huger, MD on 10/21/2017   Lloyd Huger, MD   02/01/2019 8:00 AM

## 2019-02-04 ENCOUNTER — Telehealth: Payer: Self-pay | Admitting: Emergency Medicine

## 2019-02-04 NOTE — Telephone Encounter (Signed)
Called pt to prescreen for appointment tomorrow. No answer, left message.  

## 2019-02-04 NOTE — Telephone Encounter (Signed)
No ans no vm   °

## 2019-02-05 ENCOUNTER — Inpatient Hospital Stay: Payer: Medicare Other | Admitting: Oncology

## 2019-02-05 ENCOUNTER — Encounter: Payer: Self-pay | Admitting: Oncology

## 2019-02-05 DIAGNOSIS — C3411 Malignant neoplasm of upper lobe, right bronchus or lung: Secondary | ICD-10-CM

## 2019-02-10 NOTE — Telephone Encounter (Signed)
°*  STAT* If patient is at the pharmacy, call can be transferred to refill team.   1. Which medications need to be refilled? (please list name of each medication and dose if known) rosuvastatin 5 MG daily  2. Which pharmacy/location (including street and city if local pharmacy) is medication to be sent to? [facility] on [address]  3. Do they need a 30 day or 90 day supply? 90 day   Patient scheduled appointment with R Dunn on 11/16

## 2019-02-13 NOTE — Progress Notes (Signed)
Cardiology Office Note    Date:  02/16/2019   ID:  Samuel Carney 1945-12-25, MRN [id number]  PCP:  Samuel Pitch, MD  Cardiologist:  Samuel Rogue, MD  Electrophysiologist:  None   Chief Complaint: Follow-up  History of Present Illness:   Samuel Carney is a 73 y.o. male with history of right-sided squamous cell lung carcinoma diagnosed in 09/2017 status post radiation therapy, coronary artery calcification noted on CT imaging, greater than 50-pack-year history of ongoing tobacco abuse, COPD, alcoholic cirrhosis with ongoing alcohol abuse, B12 deficiency anemia, HTN, Barrett's esophagus, GERD, and thrombocytopenia who presents for follow-up of coronary artery calcium.  Patient was initially evaluated by Dr. Rockey Carney in 09/2017 for shortness of breath and incidentally noted coronary artery calcium noted on CT imaging during work-up for lung cancer.  Images revealed multivessel moderate to heavy coronary artery calcification.  Patient noted some shortness of breath with exertion at that time.  EKG showed sinus bradycardia with T wave inversion in leads V3 through V5 as well as I and aVL.  In this setting, he underwent Lexiscan Myoview on 10/23/2017 which showed no significant ischemia or scar with an EF of greater than 65%.  Overall, this was a low risk study.  Patient comes in doing well from a cardiac perspective.  He denies any chest pain outside of some inflammation at his radiation site.  He has chronic stable dyspnea which is longstanding and not worse with exertion.  No lower extremity swelling, abdominal tension, orthopnea, PND, early satiety.  He has cut back on his drinking though does continue to drink.  He indicates he has been drinking for approximately 50 years.  He continues to smoke though has cut back to less than 1/2 pack/day and is considering quitting.  With decreased alcohol consumption, his appetite is picking back up.  No palpitations, dizziness, presyncope, syncope.   Tolerating all medications without issues.  He does not have any cardiac concerns at this time.   Labs: 01/2019 - Hgb 11.6, PLT 102, potassium 4.3, BUN 16, serum creatinine 1.4, AST/ALT normal, albumin 4.1 10/2018 - total cholesterol 125, triglyceride 220, HDL 51, LDL 30, TSH normal  Past Medical History:  Diagnosis Date   History of kidney stones    Hypertension    Lung mass    Squamous cell carcinoma of lung, right ([place])     Past Surgical History:  Procedure Laterality Date   COLONOSCOPY WITH PROPOFOL     COLONOSCOPY WITH PROPOFOL N/A 11/25/2018   Procedure: COLONOSCOPY WITH PROPOFOL;  Surgeon: Samuel Sails, MD;  Location: [facility] ENDOSCOPY;  Service: Endoscopy;  Laterality: N/A;   ESOPHAGOGASTRODUODENOSCOPY (EGD) WITH PROPOFOL N/A 11/25/2018   Procedure: ESOPHAGOGASTRODUODENOSCOPY (EGD) WITH PROPOFOL;  Surgeon: Samuel Sails, MD;  Location: [facility] ENDOSCOPY;  Service: Endoscopy;  Laterality: N/A;    Current Medications: Current Meds  Medication Sig   amLODipine (NORVASC) 10 MG tablet Take 1 tablet by mouth daily.   aspirin EC 81 MG tablet Take 1 tablet by mouth daily.   cyanocobalamin (,VITAMIN B-12,) 1000 MCG/ML injection Inject 1,000 mcg into the muscle every 30 (thirty) days.   dorzolamide-timolol (COSOPT) 22.3-6.8 MG/ML ophthalmic solution Place 1 drop into both eyes every 8 (eight) hours.   doxazosin (CARDURA) 2 MG tablet Take 2 mg by mouth daily.   metoprolol tartrate (LOPRESSOR) 100 MG tablet Take 1 tablet by mouth 2 (two) times daily.   pantoprazole (PROTONIX) 40 MG tablet Take 40 mg by mouth daily.  potassium chloride SA (KLOR-CON M20) 20 MEQ tablet Take 1 tablet by mouth daily.   rosuvastatin (CRESTOR) 5 MG tablet Take 1 tablet by mouth once daily   timolol (TIMOPTIC) 0.25 % ophthalmic solution    traMADol (ULTRAM) 50 MG tablet Take 1 tablet by mouth every 4 (four) hours as needed for pain.   Travoprost, BAK Free, (TRAVATAN Z) 0.004 % SOLN  ophthalmic solution once daily.    Allergies:   Patient has no known allergies.   Social History   Socioeconomic History   Marital status: Divorced    Spouse name: Not on file   Number of children: Not on file   Years of education: Not on file   Highest education level: Not on file  Occupational History   Not on file  Social Needs   Financial resource strain: Not on file   Food insecurity    Worry: Not on file    Inability: Not on file   Transportation needs    Medical: Not on file    Non-medical: Not on file  Tobacco Use   Smoking status: Current Every Day Smoker    Packs/day: 1.00    Years: 50.00    Pack years: 50.00    Types: Cigarettes   Smokeless tobacco: Never Used  Substance and Sexual Activity   Alcohol use: Yes    Comment: "1 pint of liquor per day"   Drug use: Never   Sexual activity: Yes  Lifestyle   Physical activity    Days per week: Not on file    Minutes per session: Not on file   Stress: Not on file  Relationships   Social connections    Talks on phone: Not on file    Gets together: Not on file    Attends religious service: Not on file    Active member of club or organization: Not on file    Attends meetings of clubs or organizations: Not on file    Relationship status: Not on file  Other Topics Concern   Not on file  Social History Narrative   Not on file     Family History:  The patient's family history includes Diabetes Mellitus II in his brother, maternal uncle, and mother; Stroke in his brother.  ROS:   Review of Systems  Constitutional: Positive for malaise/fatigue. Negative for chills, diaphoresis, fever and weight loss.  HENT: Negative for congestion.   Eyes: Negative for discharge and redness.  Respiratory: Positive for shortness of breath. Negative for cough, hemoptysis, sputum production and wheezing.   Cardiovascular: Positive for chest pain. Negative for palpitations, orthopnea, claudication, leg swelling  and PND.  Gastrointestinal: Negative for abdominal pain, blood in stool, heartburn, melena, nausea and vomiting.  Genitourinary: Negative for hematuria.  Musculoskeletal: Negative for falls and myalgias.  Skin: Negative for rash.  Neurological: Negative for dizziness, tingling, tremors, sensory change, speech change, focal weakness, loss of consciousness and weakness.  Endo/Heme/Allergies: Does not bruise/bleed easily.  Psychiatric/Behavioral: Negative for substance abuse. The patient is not nervous/anxious.   All other systems reviewed and are negative.    EKGs/Labs/Other Studies Reviewed:    Studies reviewed were summarized above. The additional studies were reviewed today:  Nuclear stress test 09/2017:  Normal pharmacologic myocardial perfusion stress test without significant ischemia or scar.  The left ventricular ejection fraction is hyperdynamic (>65%).  This is a low risk study.   EKG:  EKG is ordered today.  The EKG ordered today demonstrates sinus bradycardia, 57 bpm,  right axis deviation, nonspecific lateral ST-T changes improved in aVL and slightly more pronounced in leads V5 and V6  Recent Labs: 04/17/2018: Creatinine, Ser 1.30  Recent Lipid Panel No results found for: CHOL, TRIG, HDL, CHOLHDL, VLDL, LDLCALC, LDLDIRECT  PHYSICAL EXAM:    VS:  BP 130/70 (BP Location: Left Arm, Patient Position: Sitting, Cuff Size: Normal)    Pulse (!) 57    Temp (!) 97.2 F (36.2 C)    Wt 152 lb 12 oz (69.3 kg)    SpO2 99%    BMI 21.30 kg/m   BMI: Body mass index is 21.3 kg/m.  Physical Exam  Wt Readings from Last 3 Encounters:  02/16/19 152 lb 12 oz (69.3 kg)  11/25/18 147 lb (66.7 kg)  10/30/18 147 lb (66.7 kg)     ASSESSMENT & PLAN:   1. Coronary artery calcium: Incidentally noted during work-up for lung cancer.  Subsequent Lexiscan Myoview showed no significant ischemia with normal EF and was overall low risk.  He does note some pinpoint chest discomfort at the location  of his radiation site which is atypical and not related to exertion.  Recommend risk factor modification and primary prevention including continuation of aspirin and Crestor.  No plans for further ischemic evaluation at this time.  2. Chronic dyspnea: Stable.  Given patient's extensive alcohol use history we will schedule the patient for an echo.  Cannot exclude underlying COPD given his prolonged tobacco use.  Recent Lexiscan low risk as outlined above.  3. HTN: Blood pressure is well controlled today.  Continue current medications including Cardura, Lopressor, amlodipine, and telmisartan.  Low-sodium diet recommended.  4. HLD: LDL of 30 from 10/2018 with normal liver function.  Remains on Crestor 5 mg daily.  5. Lung cancer/COPD/ongoing tobacco abuse: Status post radiation.  Complete cessation of tobacco use is recommended.  Followed by oncology.  6. Alcoholic cirrhosis without ascites: Followed by GI.  Patient has ongoing alcohol use.  Complete cessation is recommended.  Disposition: F/u with Dr. Rockey Carney or an APP in 12 months, sooner if needed.   Medication Adjustments/Labs and Tests Ordered: Current medicines are reviewed at length with the patient today.  Concerns regarding medicines are outlined above. Medication changes, Labs and Tests ordered today are summarized above and listed in the Patient Instructions accessible in Encounters.   Signed, Christell Faith, PA-C 02/16/2019 8:47 AM     [facility] [address] [place] [address] [place], [place] [address] [phone]

## 2019-02-16 ENCOUNTER — Encounter: Payer: Self-pay | Admitting: Physician Assistant

## 2019-02-16 ENCOUNTER — Other Ambulatory Visit: Payer: Self-pay

## 2019-02-16 ENCOUNTER — Ambulatory Visit (INDEPENDENT_AMBULATORY_CARE_PROVIDER_SITE_OTHER): Payer: Medicare Other | Admitting: Physician Assistant

## 2019-02-16 VITALS — BP 130/70 | HR 57 | Temp 97.2°F | Wt 152.8 lb

## 2019-02-16 DIAGNOSIS — E785 Hyperlipidemia, unspecified: Secondary | ICD-10-CM

## 2019-02-16 DIAGNOSIS — R0602 Shortness of breath: Secondary | ICD-10-CM

## 2019-02-16 DIAGNOSIS — Z720 Tobacco use: Secondary | ICD-10-CM

## 2019-02-16 DIAGNOSIS — C3411 Malignant neoplasm of upper lobe, right bronchus or lung: Secondary | ICD-10-CM

## 2019-02-16 DIAGNOSIS — F1010 Alcohol abuse, uncomplicated: Secondary | ICD-10-CM

## 2019-02-16 DIAGNOSIS — I2510 Atherosclerotic heart disease of native coronary artery without angina pectoris: Secondary | ICD-10-CM

## 2019-02-16 DIAGNOSIS — I10 Essential (primary) hypertension: Secondary | ICD-10-CM

## 2019-02-16 NOTE — Patient Instructions (Signed)
Medication Instructions:   1. Your physician recommends that you continue on your current medications as directed. Please refer to the Current Medication list given to you today.  *If you need a refill on your cardiac medications before your next appointment, please call your pharmacy*  Lab Work:  1. None Ordered.   If you have labs (blood work) drawn today and your tests are completely normal, you will receive your results only by: Marland Kitchen MyChart Message (if you have MyChart) OR . A paper copy in the mail If you have any lab test that is abnormal or we need to change your treatment, we will call you to review the results.  Testing/Procedures:  1. Echocardiogram.  Please return to [facility] on ______________ at _______________ AM/PM for an Echocardiogram. Your physician has requested that you have an echocardiogram. Echocardiography is a painless test that uses sound waves to create images of your heart. It provides your doctor with information about the size and shape of your heart and how well your heart's chambers and valves are working. This procedure takes approximately one hour. There are no restrictions for this procedure. Please note; depending on visual quality an IV may need to be placed.    Follow-Up: At [facility], you and your health needs are our priority.  As part of our continuing mission to provide you with exceptional heart care, we have created designated Provider Care Teams.  These Care Teams include your primary Cardiologist (physician) and Advanced Practice Providers (APPs -  Physician Assistants and Nurse Practitioners) who all work together to provide you with the care you need, when you need it.  Your next appointment:   12 months  The format for your next appointment:   Either In Person or Virtual  Provider:     You may see Ida Rogue, MD or Christell Faith, PA-C

## 2019-03-19 ENCOUNTER — Ambulatory Visit (INDEPENDENT_AMBULATORY_CARE_PROVIDER_SITE_OTHER): Payer: Medicare Other

## 2019-03-19 ENCOUNTER — Other Ambulatory Visit: Payer: Self-pay

## 2019-03-19 DIAGNOSIS — F1010 Alcohol abuse, uncomplicated: Secondary | ICD-10-CM

## 2019-03-19 DIAGNOSIS — R0602 Shortness of breath: Secondary | ICD-10-CM

## 2019-03-20 ENCOUNTER — Telehealth: Payer: Self-pay

## 2019-03-20 NOTE — Telephone Encounter (Signed)
-----   Message from Rise Mu, PA-C sent at 03/20/2019  7:13 AM EST ----- Echo showed normal pump function with mild thickening of the heart along with a slightly stiffened heart. Moderately leaky mitral valve. Mildly leaky tricuspid valve. Mildly to moderately elevated pressure along the right side of the heart.  Recommendations:-Optimal BP control, this was well controlled at his office visit.  -The moderately leaky mitral valve can be followed with an echo in 12 months time.  -The elevated pressure along the right side of the heart is likely in the setting of underlying possible COPD.

## 2019-03-20 NOTE — Telephone Encounter (Signed)
Call to patient to review echo results.   No further orders at this time.   Pt verbalized understanding and will reach out to PCP about results.   Advised pt to call for any further questions or concerns.

## 2019-04-09 DIAGNOSIS — K7030 Alcoholic cirrhosis of liver without ascites: Secondary | ICD-10-CM | POA: Insufficient documentation

## 2019-04-09 DIAGNOSIS — E782 Mixed hyperlipidemia: Secondary | ICD-10-CM | POA: Insufficient documentation

## 2019-04-09 DIAGNOSIS — D696 Thrombocytopenia, unspecified: Secondary | ICD-10-CM | POA: Insufficient documentation

## 2019-04-09 DIAGNOSIS — C3411 Malignant neoplasm of upper lobe, right bronchus or lung: Secondary | ICD-10-CM | POA: Insufficient documentation

## 2019-05-15 ENCOUNTER — Other Ambulatory Visit: Payer: Self-pay | Admitting: Cardiovascular Disease

## 2019-06-08 ENCOUNTER — Other Ambulatory Visit: Payer: Self-pay | Admitting: Gastroenterology

## 2019-06-08 DIAGNOSIS — K7030 Alcoholic cirrhosis of liver without ascites: Secondary | ICD-10-CM

## 2019-06-18 ENCOUNTER — Other Ambulatory Visit: Payer: Self-pay

## 2019-06-18 ENCOUNTER — Ambulatory Visit
Admission: RE | Admit: 2019-06-18 | Discharge: 2019-06-18 | Disposition: A | Discharge status: Home / Self Care | Payer: Medicare Other | Source: Ambulatory Visit | Attending: Gastroenterology | Admitting: Gastroenterology

## 2019-06-18 DIAGNOSIS — K7030 Alcoholic cirrhosis of liver without ascites: Secondary | ICD-10-CM | POA: Insufficient documentation

## 2020-02-05 ENCOUNTER — Other Ambulatory Visit: Payer: Self-pay | Admitting: Physician Assistant

## 2020-02-23 NOTE — Progress Notes (Signed)
Cardiology Office Note  Date:  02/24/2020   ID:  Samuel Carney, Samuel Carney 1945/11/27, MRN [id number]  PCP:  Juluis Pitch, MD   No chief complaint on file.   HPI:  Samuel Carney is a 74 y.o. male hypertension  lifelong smoker, 50-pack-year smoking history Alcohol, with cirrhosis, followed by GI COPD, active smoker CT scan of the chest  right upper lobe mass.  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 Faith Rogue for consultation of his possible lung cancer Felt to be stage I carcinoma the lung based on CT scan and PET scan Biopsy performed 10/13/2017  CT scan with extensive coronary atherosclerosis Mild diffuse carotid disease aortic disease Moderate to heavy coronary calcification all 3 vessels  Last chest CT scan October 2020, followed by oncology Stable appearance of post radiation fibrosis and consolidation anterior right lung apex, appears nodule  Still smoking 1ppd, no real desire to quit or try modalities to quit Still drinking, trying to cut back Some shortness of breath with exertion No regular exercise program Denies any chest pain   EKG personally reviewed by myself on todays visit Shows normal sinus rhythm rate 60 beats minute  T-wave inversion V5 through V6 1 and aVL concerning for anterolateral ischemia   PMH:   has a past medical history of History of kidney stones, Hypertension, Lung mass, and Squamous cell carcinoma of lung, right ([place]).smoker, cOPD, coronary artery disease  PSH:    Past Surgical History:  Procedure Laterality Date   COLONOSCOPY WITH PROPOFOL     COLONOSCOPY WITH PROPOFOL N/A 11/25/2018   Procedure: COLONOSCOPY WITH PROPOFOL;  Surgeon: Lollie Sails, MD;  Location: [facility] ENDOSCOPY;  Service: Endoscopy;  Laterality: N/A;   ESOPHAGOGASTRODUODENOSCOPY (EGD) WITH PROPOFOL N/A 11/25/2018   Procedure: ESOPHAGOGASTRODUODENOSCOPY (EGD) WITH PROPOFOL;  Surgeon: Lollie Sails, MD;  Location: [facility] ENDOSCOPY;  Service: Endoscopy;  Laterality: N/A;    Current Outpatient Medications  Medication Sig Dispense Refill   amLODipine (NORVASC) 10 MG tablet Take 1 tablet by mouth daily.     aspirin EC 81 MG tablet Take 1 tablet by mouth daily.     cyanocobalamin (,VITAMIN B-12,) 1000 MCG/ML injection Inject 1,000 mcg into the muscle every 30 (thirty) days.     dorzolamide-timolol (COSOPT) 22.3-6.8 MG/ML ophthalmic solution Place 1 drop into both eyes every 8 (eight) hours.  6   doxazosin (CARDURA) 2 MG tablet Take 2 mg by mouth daily.  1   metoprolol tartrate (LOPRESSOR) 100 MG tablet Take 1 tablet by mouth 2 (two) times daily.     pantoprazole (PROTONIX) 40 MG tablet Take 40 mg by mouth daily.     potassium chloride SA (KLOR-CON M20) 20 MEQ tablet Take 1 tablet by mouth daily.     rosuvastatin (CRESTOR) 5 MG tablet Take 1 tablet  by mouth once daily 90 tablet 0   telmisartan (MICARDIS) 80 MG tablet Take 1 tablet by mouth daily.     timolol (TIMOPTIC) 0.25 % ophthalmic solution      traMADol (ULTRAM) 50 MG tablet Take 1 tablet by mouth every 4 (four) hours as needed for pain.     Travoprost, BAK Free, (TRAVATAN Z) 0.004 % SOLN ophthalmic solution once daily.     No current  facility-administered medications for this visit.     Allergies:   Patient has no known allergies.   Social History:  The patient  reports that he has been smoking cigarettes. He has a 50.00 pack-year smoking history. He has never used smokeless tobacco. He reports current alcohol use. He reports that he does not use drugs.   Family History:   family history includes Diabetes Mellitus II in his brother, maternal uncle, and mother; Stroke in his brother.    Review of Systems: Review of Systems  Constitutional: Negative.   Respiratory: Positive for shortness of breath.   Cardiovascular: Negative.   Gastrointestinal: Negative.   Musculoskeletal: Negative.   Neurological: Negative.   Psychiatric/Behavioral: Negative.   All other systems reviewed and are negative.    PHYSICAL EXAM: VS:  There were no vitals taken for this visit. , BMI There is no height or weight on file to calculate BMI. Constitutional:  oriented to person, place, and time. No distress.  HENT:  Head: Grossly normal Eyes:  no discharge. No scleral icterus.  Neck: No JVD, no carotid bruits  Cardiovascular: Regular rate and rhythm, no murmurs appreciated Pulmonary/Chest: Decreased breath sounds throughout, scattered Rales Abdominal: Soft.  no distension.  no tenderness.  Musculoskeletal: Normal range of motion Neurological:  normal muscle tone. Coordination normal. No atrophy Skin: Skin warm and dry Psychiatric: normal affect, pleasant   Recent Labs: No results found for requested labs within last 8760 hours.    Lipid Panel No results found for: CHOL, HDL, LDLCALC, TRIG    Wt Readings from Last 3 Encounters:  02/16/19 152 lb 12 oz (69.3 kg)  11/25/18 147 lb (66.7 kg)  10/30/18 147 lb (66.7 kg)       ASSESSMENT AND PLAN:  Malignant 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 [facility]) Completed radiation, followed by oncology Last CT scan end of 2020 Followed by Dr. Leron Croak with stable angina Heavy  coronary calcification all 3 vessels as well as diffuse aortic atherosclerosis Prior Myoview with no ischemia dated July 2019 Smoking cessation recommended Long discussion concerning anginal symptoms to watch for Discussed contingency plan, if he has worsening shortness of breath or chest pain he should call us or go to the emergency room  Tobacco abuse Cessation recommended, different modalities discussed Still no desire to quit  Benign essential hypertension Blood pressure is well controlled on today's visit. No changes made to the medications.  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    Signed, Esmond Plants, M.D., Ph.D. 02/24/2020  [place], [place]

## 2020-02-24 ENCOUNTER — Encounter: Payer: Self-pay | Admitting: Cardiovascular Disease

## 2020-02-24 ENCOUNTER — Other Ambulatory Visit: Payer: Self-pay

## 2020-02-24 ENCOUNTER — Ambulatory Visit: Payer: Medicare Other | Admitting: Cardiovascular Disease

## 2020-02-24 VITALS — BP 142/72 | HR 60 | Ht 71.0 in | Wt 154.0 lb

## 2020-02-24 DIAGNOSIS — F1010 Alcohol abuse, uncomplicated: Secondary | ICD-10-CM | POA: Diagnosis not present

## 2020-02-24 DIAGNOSIS — E785 Hyperlipidemia, unspecified: Secondary | ICD-10-CM

## 2020-02-24 DIAGNOSIS — Z720 Tobacco use: Secondary | ICD-10-CM | POA: Diagnosis not present

## 2020-02-24 DIAGNOSIS — I10 Essential (primary) hypertension: Secondary | ICD-10-CM

## 2020-02-24 DIAGNOSIS — R0602 Shortness of breath: Secondary | ICD-10-CM

## 2020-02-24 DIAGNOSIS — I2510 Atherosclerotic heart disease of native coronary artery without angina pectoris: Secondary | ICD-10-CM | POA: Diagnosis not present

## 2020-02-24 DIAGNOSIS — C3411 Malignant neoplasm of upper lobe, right bronchus or lung: Secondary | ICD-10-CM

## 2020-02-24 MED ORDER — ROSUVASTATIN CALCIUM 5 MG PO TABS: 5.0000 mg | ORAL_TABLET | Freq: Every day | ORAL | 3 refills | Status: DC

## 2020-02-24 NOTE — Patient Instructions (Signed)
Medication Instructions:  No changes  If you need a refill on your cardiac medications before your next appointment, please call your pharmacy.    Lab work: No new labs needed   If you have labs (blood work) drawn today and your tests are completely normal, you will receive your results only by: Marland Kitchen MyChart Message (if you have MyChart) OR . A paper copy in the mail If you have any lab test that is abnormal or we need to change your treatment, we will call you to review the results.   Testing/Procedures: No new testing needed   Follow-Up: At [facility], you and your health needs are our priority.  As part of our continuing mission to provide you with exceptional heart care, we have created designated Provider Care Teams.  These Care Teams include your primary Cardiologist (physician) and Advanced Practice Providers (APPs -  Physician Assistants and Nurse Practitioners) who all work together to provide you with the care you need, when you need it.  . You will need a follow up appointment in 6 months with APP  . Providers on your designated Care Team:   . Murray Hodgkins, NP . Christell Faith, PA-C . Marrianne Mood, PA-C  Any Other Special Instructions Will Be Listed Below (If Applicable).  COVID-19 Vaccine Information can be found at: ShippingScam.co.uk For questions related to vaccine distribution or appointments, please email vaccine@[email or web address] [email or web address] or call [phone].

## 2020-04-07 ENCOUNTER — Telehealth: Payer: Self-pay | Admitting: Nurse Practitioner

## 2020-04-07 ENCOUNTER — Ambulatory Visit (HOSPITAL_COMMUNITY)
Admission: RE | Admit: 2020-04-07 | Discharge: 2020-04-07 | Disposition: A | Discharge status: Home / Self Care | Payer: Medicare Other | Source: Ambulatory Visit | Attending: Pulmonary Disease | Admitting: Pulmonary Disease

## 2020-04-07 ENCOUNTER — Other Ambulatory Visit: Payer: Self-pay | Admitting: Nurse Practitioner

## 2020-04-07 DIAGNOSIS — U071 COVID-19: Secondary | ICD-10-CM

## 2020-04-07 DIAGNOSIS — C3411 Malignant neoplasm of upper lobe, right bronchus or lung: Secondary | ICD-10-CM

## 2020-04-07 DIAGNOSIS — I10 Essential (primary) hypertension: Secondary | ICD-10-CM | POA: Diagnosis not present

## 2020-04-07 MED ORDER — EPINEPHRINE 0.3 MG/0.3ML IJ SOAJ: 0.3000 mg | Freq: Once | INTRAMUSCULAR | Status: DC | PRN

## 2020-04-07 MED ORDER — SODIUM CHLORIDE 0.9 % IV SOLN: 100.0000 mg | Freq: Once | INTRAVENOUS | Status: DC

## 2020-04-07 MED ORDER — DIPHENHYDRAMINE HCL 50 MG/ML IJ SOLN: 50.0000 mg | Freq: Once | INTRAMUSCULAR | Status: DC | PRN

## 2020-04-07 MED ORDER — ALBUTEROL SULFATE HFA 108 (90 BASE) MCG/ACT IN AERS: 2.0000 | INHALATION_SPRAY | Freq: Once | RESPIRATORY_TRACT | Status: DC | PRN

## 2020-04-07 MED ORDER — SODIUM CHLORIDE 0.9 % IV SOLN: INTRAVENOUS | Status: DC | PRN

## 2020-04-07 MED ORDER — FAMOTIDINE IN NACL 20-0.9 MG/50ML-% IV SOLN: 20.0000 mg | Freq: Once | INTRAVENOUS | Status: DC | PRN

## 2020-04-07 MED ORDER — SODIUM CHLORIDE 0.9 % IV SOLN
100.0000 mg | Freq: Once | INTRAVENOUS | Status: AC
  Administered 2020-04-07: 100 mg via INTRAVENOUS

## 2020-04-07 MED ORDER — METHYLPREDNISOLONE SODIUM SUCC 125 MG IJ SOLR: 125.0000 mg | Freq: Once | INTRAMUSCULAR | Status: DC | PRN

## 2020-04-07 NOTE — Progress Notes (Signed)
Patient reviewed Fact Sheet for Patients, Parents, and Caregivers for Emergency Use Authorization (EUA) of sotrovimab for the Treatment of Coronavirus. Patient also reviewed and is agreeable to the estimated cost of treatment. Patient is agreeable to proceed.   

## 2020-04-07 NOTE — Discharge Instructions (Signed)
10 Things You Can Do to Manage Your COVID-19 Symptoms at Home If you have possible or confirmed COVID-19: 1. Stay home from work and school. And stay away from other public places. If you must go out, avoid using any kind of public transportation, ridesharing, or taxis. 2. Monitor your symptoms carefully. If your symptoms get worse, call your healthcare provider immediately. 3. Get rest and stay hydrated. 4. If you have a medical appointment, call the healthcare provider ahead of time and tell them that you have or may have COVID-19. 5. For medical emergencies, call 911 and notify the dispatch personnel that you have or may have COVID-19. 6. Cover your cough and sneezes with a tissue or use the inside of your elbow. 7. Wash your hands often with soap and water for at least 20 seconds or clean your hands with an alcohol-based hand sanitizer that contains at least 60% alcohol. 8. As much as possible, stay in a specific room and away from other people in your home. Also, you should use a separate bathroom, if available. If you need to be around other people in or outside of the home, wear a mask. 9. Avoid sharing personal items with other people in your household, like dishes, towels, and bedding. 10. Clean all surfaces that are touched often, like counters, tabletops, and doorknobs. Use household cleaning sprays or wipes according to the label instructions. cdc.gov/coronavirus 10/01/2018 This information is not intended to replace advice given to you by your health care provider. Make sure you discuss any questions you have with your health care provider. Document Revised: 03/05/2019 Document Reviewed: 03/05/2019 Elsevier Patient Education  2020 Elsevier Inc.  If you have any questions or concerns after the infusion please call the Advanced Practice Provider on call at [phone]. This number is ONLY intended for your use regarding questions or concerns about the infusion post-treatment  side-effects.  Please do not provide this number to others for use. For return to work notes please contact your primary care provider.   If someone you know is interested in receiving treatment please have them call the COVID hotline at [phone].    

## 2020-04-07 NOTE — Progress Notes (Signed)
I connected by phone with Samuel Carney on 04/07/2020 at 12:45 PM to discuss the potential use of the antiviral REMDESIVIR for acute COVID-19 viral infection in non-hospitalized patients.   This patient is a 75 y.o. male that meets the FDA criteria for Emergency Use Authorization of [place].  Has a (+) direct SARS-CoV-2 viral test result  Has mild or moderate symptoms related to COVID-19  Is within 7 days of symptom onset  Has at least one of the high risk factor(s) for progression to severe COVID-19 and/or hospitalization as defined in NIH Guidelines and EUA.   Specific high risk criteria : Older age (>/= 73 yo)   I have spoken and communicated the following to the patient or parent/caregiver regarding COVID-19 IV Antiviral treatment:  1. FDA has authorized the emergency use for the treatment of mild to moderate COVID-19 in adults and pediatric patients with positive results of SARS-CoV-2 testing who are 38 years of age and older weighing at least 40 kg, and who are at high risk for progressing to severe COVID-19 and/or hospitalization.  2. The significant known and potential risks and benefits in receiving REMDESIVIR in accordance with current NIH treatment guidelines.   3. Information on available alternative treatments and the risks and benefits of those alternatives, including clinical trials that may be accessible to the patient.   4. Patients treated with antiviral therapy should continue to isolate and use infection control measures (e.g., wear mask, isolate, social distance, avoid sharing personal items, clean and disinfect "high touch" surfaces, and frequent handwashing) according to CDC guidelines.   5. The patient or parent/caregiver has the option to accept or refuse REMDESIVIR therapy and has had the opportunity to have all questions addressed prior to consenting for treatment.    After reviewing this information with the patient, he/she has decided to proceed with the 3  day course of treatment.   Fenton Foy, NP 04/07/2020  12:45 PM

## 2020-04-07 NOTE — Telephone Encounter (Signed)
Pt spouse called states received referral for MAB Infusions from PCP visit Monday 04/04/20 where he tested positive for COVID. Pt referred based on heart conditions and age per wife. Pt has been unsuccessful in reacing anyone at both phone numbers given for infusion center. Wife reports pt has had some loose bowels and has stopped eating. Will contact infusion center for status of scheduling.  Wife Kace Hartje [phone].

## 2020-04-08 ENCOUNTER — Ambulatory Visit (HOSPITAL_COMMUNITY)
Admission: RE | Admit: 2020-04-08 | Discharge: 2020-04-08 | Disposition: A | Discharge status: Home / Self Care | Payer: Medicare Other | Source: Ambulatory Visit | Attending: Pulmonary Disease | Admitting: Pulmonary Disease

## 2020-04-08 DIAGNOSIS — I10 Essential (primary) hypertension: Secondary | ICD-10-CM | POA: Insufficient documentation

## 2020-04-08 DIAGNOSIS — U071 COVID-19: Secondary | ICD-10-CM | POA: Diagnosis present

## 2020-04-08 DIAGNOSIS — C3411 Malignant neoplasm of upper lobe, right bronchus or lung: Secondary | ICD-10-CM | POA: Insufficient documentation

## 2020-04-08 MED ORDER — METHYLPREDNISOLONE SODIUM SUCC 125 MG IJ SOLR: 125.0000 mg | Freq: Once | INTRAMUSCULAR | Status: DC | PRN

## 2020-04-08 MED ORDER — ALBUTEROL SULFATE HFA 108 (90 BASE) MCG/ACT IN AERS
2.0000 | INHALATION_SPRAY | Freq: Once | RESPIRATORY_TRACT | Status: DC | PRN
Start: 2020-04-08 — End: 2020-04-08

## 2020-04-08 MED ORDER — DIPHENHYDRAMINE HCL 50 MG/ML IJ SOLN: 50.0000 mg | Freq: Once | INTRAMUSCULAR | Status: DC | PRN

## 2020-04-08 MED ORDER — FAMOTIDINE IN NACL 20-0.9 MG/50ML-% IV SOLN: 20.0000 mg | Freq: Once | INTRAVENOUS | Status: DC | PRN

## 2020-04-08 MED ORDER — EPINEPHRINE 0.3 MG/0.3ML IJ SOAJ: 0.3000 mg | Freq: Once | INTRAMUSCULAR | Status: DC | PRN

## 2020-04-08 MED ORDER — SODIUM CHLORIDE 0.9 % IV SOLN: 100.0000 mg | Freq: Once | INTRAVENOUS | Status: DC

## 2020-04-08 MED ORDER — ALBUTEROL SULFATE HFA 108 (90 BASE) MCG/ACT IN AERS: 2.0000 | INHALATION_SPRAY | Freq: Once | RESPIRATORY_TRACT | Status: DC | PRN

## 2020-04-08 MED ORDER — SODIUM CHLORIDE 0.9 % IV SOLN: INTRAVENOUS | Status: DC | PRN

## 2020-04-08 MED ORDER — SODIUM CHLORIDE 0.9 % IV SOLN
100.0000 mg | Freq: Once | INTRAVENOUS | Status: AC
  Administered 2020-04-08: 100 mg via INTRAVENOUS

## 2020-04-08 NOTE — Discharge Instructions (Signed)
10 Things You Can Do to Manage Your COVID-19 Symptoms at Home If you have possible or confirmed COVID-19: 1. Stay home from work and school. And stay away from other public places. If you must go out, avoid using any kind of public transportation, ridesharing, or taxis. 2. Monitor your symptoms carefully. If your symptoms get worse, call your healthcare provider immediately. 3. Get rest and stay hydrated. 4. If you have a medical appointment, call the healthcare provider ahead of time and tell them that you have or may have COVID-19. 5. For medical emergencies, call 911 and notify the dispatch personnel that you have or may have COVID-19. 6. Cover your cough and sneezes with a tissue or use the inside of your elbow. 7. Wash your hands often with soap and water for at least 20 seconds or clean your hands with an alcohol-based hand sanitizer that contains at least 60% alcohol. 8. As much as possible, stay in a specific room and away from other people in your home. Also, you should use a separate bathroom, if available. If you need to be around other people in or outside of the home, wear a mask. 9. Avoid sharing personal items with other people in your household, like dishes, towels, and bedding. 10. Clean all surfaces that are touched often, like counters, tabletops, and doorknobs. Use household cleaning sprays or wipes according to the label instructions. cdc.gov/coronavirus 10/01/2018 This information is not intended to replace advice given to you by your health care provider. Make sure you discuss any questions you have with your health care provider. Document Revised: 03/05/2019 Document Reviewed: 03/05/2019 Elsevier Patient Education  2020 Elsevier Inc.  If you have any questions or concerns after the infusion please call the Advanced Practice Provider on call at [phone]. This number is ONLY intended for your use regarding questions or concerns about the infusion post-treatment  side-effects.  Please do not provide this number to others for use. For return to work notes please contact your primary care provider.   If someone you know is interested in receiving treatment please have them call the COVID hotline at [phone].    

## 2020-04-08 NOTE — Progress Notes (Signed)
Patient reviewed Fact Sheet for Patients, Parents, and Caregivers for Emergency Use Authorization (EUA) of remdesivir for the Treatment of Coronavirus. Patient also reviewed and is agreeable to the estimated cost of treatment. Patient is agreeable to proceed.    

## 2020-04-08 NOTE — Progress Notes (Addendum)
  Diagnosis: COVID-19  Physician: Dr. Joya Gaskins  Procedure: [facility] Med: remdesivir infusion - Provided patient with remdesivir fact sheet for patients, parents and caregivers prior to infusion.  Complications: No immediate complications noted.  Discharge: Discharged home   Winfred Leeds 04/08/2020

## 2020-04-09 ENCOUNTER — Ambulatory Visit (HOSPITAL_COMMUNITY)
Admission: RE | Admit: 2020-04-09 | Discharge: 2020-04-09 | Disposition: A | Discharge status: Home / Self Care | Payer: Medicare Other | Source: Ambulatory Visit | Attending: Pulmonary Disease | Admitting: Pulmonary Disease

## 2020-04-09 DIAGNOSIS — U071 COVID-19: Secondary | ICD-10-CM | POA: Insufficient documentation

## 2020-04-09 DIAGNOSIS — J1282 Pneumonia due to coronavirus disease 2019: Secondary | ICD-10-CM | POA: Insufficient documentation

## 2020-04-09 MED ORDER — EPINEPHRINE 0.3 MG/0.3ML IJ SOAJ: 0.3000 mg | Freq: Once | INTRAMUSCULAR | Status: DC | PRN

## 2020-04-09 MED ORDER — FAMOTIDINE IN NACL 20-0.9 MG/50ML-% IV SOLN: 20.0000 mg | Freq: Once | INTRAVENOUS | Status: DC | PRN

## 2020-04-09 MED ORDER — SODIUM CHLORIDE 0.9 % IV SOLN: INTRAVENOUS | Status: DC | PRN

## 2020-04-09 MED ORDER — ALBUTEROL SULFATE HFA 108 (90 BASE) MCG/ACT IN AERS: 2.0000 | INHALATION_SPRAY | Freq: Once | RESPIRATORY_TRACT | Status: DC | PRN

## 2020-04-09 MED ORDER — METHYLPREDNISOLONE SODIUM SUCC 125 MG IJ SOLR: 125.0000 mg | Freq: Once | INTRAMUSCULAR | Status: DC | PRN

## 2020-04-09 MED ORDER — DIPHENHYDRAMINE HCL 50 MG/ML IJ SOLN: 50.0000 mg | Freq: Once | INTRAMUSCULAR | Status: DC | PRN

## 2020-04-09 MED ORDER — SODIUM CHLORIDE 0.9 % IV SOLN
100.0000 mg | Freq: Once | INTRAVENOUS | Status: AC
  Administered 2020-04-09: 100 mg via INTRAVENOUS

## 2020-04-09 NOTE — Discharge Instructions (Signed)
10 Things You Can Do to Manage Your COVID-19 Symptoms at Home If you have possible or confirmed COVID-19: 1. Stay home from work and school. And stay away from other public places. If you must go out, avoid using any kind of public transportation, ridesharing, or taxis. 2. Monitor your symptoms carefully. If your symptoms get worse, call your healthcare provider immediately. 3. Get rest and stay hydrated. 4. If you have a medical appointment, call the healthcare provider ahead of time and tell them that you have or may have COVID-19. 5. For medical emergencies, call 911 and notify the dispatch personnel that you have or may have COVID-19. 6. Cover your cough and sneezes with a tissue or use the inside of your elbow. 7. Wash your hands often with soap and water for at least 20 seconds or clean your hands with an alcohol-based hand sanitizer that contains at least 60% alcohol. 8. As much as possible, stay in a specific room and away from other people in your home. Also, you should use a separate bathroom, if available. If you need to be around other people in or outside of the home, wear a mask. 9. Avoid sharing personal items with other people in your household, like dishes, towels, and bedding. 10. Clean all surfaces that are touched often, like counters, tabletops, and doorknobs. Use household cleaning sprays or wipes according to the label instructions. cdc.gov/coronavirus 10/01/2018 This information is not intended to replace advice given to you by your health care provider. Make sure you discuss any questions you have with your health care provider. Document Revised: 03/05/2019 Document Reviewed: 03/05/2019 Elsevier Patient Education  2020 Elsevier Inc. What types of side effects do monoclonal antibody drugs cause?  Common side effects  In general, the more common side effects caused by monoclonal antibody drugs include: . Allergic reactions, such as hives or itching . Flu-like signs and  symptoms, including chills, fatigue, fever, and muscle aches and pains . Nausea, vomiting . Diarrhea . Skin rashes . Low blood pressure   The CDC is recommending patients who receive monoclonal antibody treatments wait at least 90 days before being vaccinated.  Currently, there are no data on the safety and efficacy of mRNA COVID-19 vaccines in persons who received monoclonal antibodies or convalescent plasma as part of COVID-19 treatment. Based on the estimated half-life of such therapies as well as evidence suggesting that reinfection is uncommon in the 90 days after initial infection, vaccination should be deferred for at least 90 days, as a precautionary measure until additional information becomes available, to avoid interference of the antibody treatment with vaccine-induced immune responses. If you have any questions or concerns after the infusion please call the Advanced Practice Provider on call at [phone]. This number is ONLY intended for your use regarding questions or concerns about the infusion post-treatment side-effects.  Please do not provide this number to others for use. For return to work notes please contact your primary care provider.   If someone you know is interested in receiving treatment please have them call the COVID hotline at [phone].   

## 2020-04-09 NOTE — Progress Notes (Signed)
  Diagnosis: COVID-19  Physician: Dr. Asencion Noble   Procedure: [facility] Infusion Clinic Med: remdesivir infusion - Provided patient with remdesivir fact sheet for patients, parents and caregivers prior to infusion.  Complications: No immediate complications noted.  Discharge: Discharged home   Monna Fam 04/09/2020

## 2020-05-09 ENCOUNTER — Encounter (INDEPENDENT_AMBULATORY_CARE_PROVIDER_SITE_OTHER): Payer: Medicare Other

## 2020-05-09 ENCOUNTER — Encounter (INDEPENDENT_AMBULATORY_CARE_PROVIDER_SITE_OTHER): Payer: Medicare Other | Admitting: Vascular Surgery

## 2020-05-11 NOTE — Addendum Note (Signed)
Encounter addended by: Paul Dykes, RN on: 05/11/2020 6:30 PM  Actions taken: Charge Capture section accepted

## 2020-05-12 ENCOUNTER — Other Ambulatory Visit: Payer: Self-pay | Admitting: Physician Assistant

## 2020-05-18 ENCOUNTER — Other Ambulatory Visit (INDEPENDENT_AMBULATORY_CARE_PROVIDER_SITE_OTHER): Payer: Self-pay | Admitting: Vascular Surgery

## 2020-05-18 DIAGNOSIS — L97529 Non-pressure chronic ulcer of other part of left foot with unspecified severity: Secondary | ICD-10-CM

## 2020-05-18 DIAGNOSIS — I739 Peripheral vascular disease, unspecified: Secondary | ICD-10-CM

## 2020-05-19 ENCOUNTER — Ambulatory Visit (INDEPENDENT_AMBULATORY_CARE_PROVIDER_SITE_OTHER): Payer: Medicare Other | Admitting: Vascular Surgery

## 2020-05-19 ENCOUNTER — Ambulatory Visit (INDEPENDENT_AMBULATORY_CARE_PROVIDER_SITE_OTHER): Payer: Medicare Other

## 2020-05-19 ENCOUNTER — Other Ambulatory Visit: Payer: Self-pay

## 2020-05-19 ENCOUNTER — Encounter (INDEPENDENT_AMBULATORY_CARE_PROVIDER_SITE_OTHER): Payer: Self-pay | Admitting: Vascular Surgery

## 2020-05-19 VITALS — BP 97/51 | HR 65 | Resp 16 | Ht 71.0 in | Wt 151.0 lb

## 2020-05-19 DIAGNOSIS — E782 Mixed hyperlipidemia: Secondary | ICD-10-CM | POA: Diagnosis not present

## 2020-05-19 DIAGNOSIS — I7025 Atherosclerosis of native arteries of other extremities with ulceration: Secondary | ICD-10-CM | POA: Diagnosis not present

## 2020-05-19 DIAGNOSIS — I739 Peripheral vascular disease, unspecified: Secondary | ICD-10-CM

## 2020-05-19 DIAGNOSIS — L97529 Non-pressure chronic ulcer of other part of left foot with unspecified severity: Secondary | ICD-10-CM | POA: Diagnosis not present

## 2020-05-19 DIAGNOSIS — I10 Essential (primary) hypertension: Secondary | ICD-10-CM | POA: Diagnosis not present

## 2020-05-19 DIAGNOSIS — I25118 Atherosclerotic heart disease of native coronary artery with other forms of angina pectoris: Secondary | ICD-10-CM | POA: Diagnosis not present

## 2020-05-19 NOTE — H&P (View-Only) (Signed)
MRN : [id number]  Samuel Carney is a 75 y.o. (01-Dec-1945) male who presents with chief complaint of No chief complaint on file. Marland Kitchen  History of Present Illness:   The patient is seen for evaluation of painful lower extremities and diminished pulses associated with ulceration of the foot.  The patient notes the ulcer has been present for multiple weeks and has not been improving.  It is very painful and has had some drainage.  No specific history of trauma noted by the patient.  The patient denies fever or chills.  the patient does have diabetes which has been difficult to control.  Patient notes prior to the ulcer developing the extremities were painful particularly with ambulation or activity and the discomfort is very consistent day today. Typically, the pain occurs at less than one block, progress is as activity continues to the point that the patient must stop walking. Resting including standing still for several minutes allowed resumption of the activity and the ability to walk a similar distance before stopping again. Uneven terrain and inclined shorten the distance. The pain has been progressive over the past several years.   The patient denies rest pain or dangling of an extremity off the side of the bed during the night for relief. No prior interventions or surgeries.  No history of back problems or DJD of the lumbar sacral spine.   The patient denies amaurosis fugax or recent TIA symptoms. There are no recent neurological changes noted. The patient denies history of DVT, PE or superficial thrombophlebitis. The patient denies recent episodes of angina or shortness of breath.   ABI Rt=0.83 and Lt=0.47 monophasic tibial signal bilaterally  No outpatient medications have been marked as taking for the 05/19/20 encounter (Appointment) with Delana Meyer, Dolores Lory, MD.    Past Medical History:  Diagnosis Date  . History of kidney stones   . Hypertension   . Lung mass   . Squamous cell  carcinoma of lung, right [facility])     Past Surgical History:  Procedure Laterality Date  . COLONOSCOPY WITH PROPOFOL    . COLONOSCOPY WITH PROPOFOL N/A 11/25/2018   Procedure: COLONOSCOPY WITH PROPOFOL;  Surgeon: Lollie Sails, MD;  Location: [facility] ENDOSCOPY;  Service: Endoscopy;  Laterality: N/A;  . ESOPHAGOGASTRODUODENOSCOPY (EGD) WITH PROPOFOL N/A 11/25/2018   Procedure: ESOPHAGOGASTRODUODENOSCOPY (EGD) WITH PROPOFOL;  Surgeon: Lollie Sails, MD;  Location: [facility] ENDOSCOPY;  Service: Endoscopy;  Laterality: N/A;    Social History Social History   Tobacco Use  . Smoking status: Current Every Day Smoker    Packs/day: 1.00    Years: 50.00    Pack years: 50.00    Types: Cigarettes  . Smokeless tobacco: Never Used  Vaping Use  . Vaping Use: Never used  Substance Use Topics  . Alcohol use: Yes    Comment: "1 pint of liquor per day"  . Drug use: Never    Family History Family History  Problem Relation Age of Onset  . Stroke Brother   . Diabetes Mellitus II Brother   . Diabetes Mellitus II Mother   . Diabetes Mellitus II Maternal Uncle   No family history of bleeding/clotting disorders, porphyria or autoimmune disease   No Known Allergies   REVIEW OF SYSTEMS (Negative unless checked)  Constitutional: [] Weight loss  [] Fever  [] Chills Cardiac: [] Chest pain   [] Chest pressure   [] Palpitations   [] Shortness of breath when laying flat   [] Shortness of breath with exertion. Vascular:  [x] Pain in legs  with walking   [x] Pain in legs at rest  [] History of DVT   [] Phlebitis   [] Swelling in legs   [] Varicose veins   [x] Non-healing ulcers Pulmonary:   [] Uses home oxygen   [] Productive cough   [] Hemoptysis   [] Wheeze  [] COPD   [] Asthma Neurologic:  [] Dizziness   [] Seizures   [] History of stroke   [] History of TIA  [] Aphasia   [] Vissual changes   [] Weakness or numbness in arm   [] Weakness or numbness in leg Musculoskeletal:   [] Joint swelling   [] Joint pain   [] Low back  pain Hematologic:  [] Easy bruising  [] Easy bleeding   [] Hypercoagulable state   [] Anemic Gastrointestinal:  [] Diarrhea   [] Vomiting  [] Gastroesophageal reflux/heartburn   [] Difficulty swallowing. Genitourinary:  [] Chronic kidney disease   [] Difficult urination  [] Frequent urination   [] Blood in urine Skin:  [] Rashes   [] Ulcers  Psychological:  [] History of anxiety   []  History of major depression.  Physical Examination  There were no vitals filed for this visit. There is no height or weight on file to calculate BMI. Gen: WD/WN, NAD Head: [place]/AT, No temporalis wasting.  Ear/Nose/Throat: Hearing grossly intact, nares w/o erythema or drainage, poor dentition Eyes: PER, EOMI, sclera nonicteric.  Neck: Supple, no masses.  No bruit or JVD.  Pulmonary:  Good air movement, clear to auscultation bilaterally, no use of accessory muscles.  Cardiac: RRR, normal S1, S2, no Murmurs. Vascular:  Left great toe ulcer noninfected  Vessel Right Left  Radial Palpable Palpable  PT Not Palpable Not Palpable  DP Not Palpable Not Palpable  Gastrointestinal: soft, non-distended. No guarding/no peritoneal signs.  Musculoskeletal: M/S 5/5 throughout.  No deformity or atrophy.  Neurologic: CN 2-12 intact. Pain and light touch intact in extremities.  Symmetrical.  Speech is fluent. Motor exam as listed above. Psychiatric: Judgment intact, Mood & affect appropriate for pt's clinical situation. Dermatologic: No rashes + left great toe ulcers noted.  No changes consistent with cellulitis.   CBC Lab Results  Component Value Date   WBC 4.0 10/14/2017   HGB 12.0 (L) 10/14/2017   HCT 35.1 (L) 10/14/2017   MCV 101.8 (H) 10/14/2017   PLT 137 (L) 10/14/2017    BMET    Component Value Date/Time   NA 136 12/28/2012 0547   K 3.5 12/28/2012 0547   CL 105 12/28/2012 0547   CO2 26 12/28/2012 0547   GLUCOSE 96 12/28/2012 0547   BUN 14 12/28/2012 0547   CREATININE 1.30 (H) 04/17/2018 0756   CREATININE 1.09  12/28/2012 0547   CALCIUM 8.8 12/28/2012 0547   GFRNONAA >60 12/28/2012 0547   GFRAA >60 12/28/2012 0547   CrCl cannot be calculated (Patient's most recent lab result is older than the maximum 21 days allowed.).  COAG Lab Results  Component Value Date   INR 0.92 10/14/2017   INR 0.9 12/26/2012   INR 1.0 06/26/2012    Radiology No results found.    Assessment/Plan 1. Atherosclerosis of native arteries of the extremities with ulceration ([place])  Recommend:  The patient has evidence of severe atherosclerotic changes of both lower extremities associated with ulceration and tissue loss of the left foot.  This represents a limb threatening ischemia and places the patient at the risk for left limb loss.  Patient should undergo angiography of the left lower extremities with the hope for intervention for limb salvage.  The risks and benefits as well as the alternative therapies was discussed in detail with the patient.  All  questions were answered.  Patient agrees to proceed with left leg angiography.  The patient will follow up with me in the office after the procedure.    2. Benign essential hypertension Continue antihypertensive medications as already ordered, these medications have been reviewed and there are no changes at this time.   3. Coronary artery disease of native artery of native heart with stable angina pectoris (HCC) Continue cardiac and antihypertensive medications as already ordered and reviewed, no changes at this time.  Continue statin as ordered and reviewed, no changes at this time  Nitrates PRN for chest pain   4. Hyperlipidemia, mixed Continue statin as ordered and reviewed, no changes at this time    [name], MD  05/19/2020 8:43 AM

## 2020-05-19 NOTE — Progress Notes (Signed)
MRN : [id number]  Samuel Carney is a 75 y.o. (05-28-1945) male who presents with chief complaint of No chief complaint on file. Marland Kitchen  History of Present Illness:   The patient is seen for evaluation of painful lower extremities and diminished pulses associated with ulceration of the foot.  The patient notes the ulcer has been present for multiple weeks and has not been improving.  It is very painful and has had some drainage.  No specific history of trauma noted by the patient.  The patient denies fever or chills.  the patient does have diabetes which has been difficult to control.  Patient notes prior to the ulcer developing the extremities were painful particularly with ambulation or activity and the discomfort is very consistent day today. Typically, the pain occurs at less than one block, progress is as activity continues to the point that the patient must stop walking. Resting including standing still for several minutes allowed resumption of the activity and the ability to walk a similar distance before stopping again. Uneven terrain and inclined shorten the distance. The pain has been progressive over the past several years.   The patient denies rest pain or dangling of an extremity off the side of the bed during the night for relief. No prior interventions or surgeries.  No history of back problems or DJD of the lumbar sacral spine.   The patient denies amaurosis fugax or recent TIA symptoms. There are no recent neurological changes noted. The patient denies history of DVT, PE or superficial thrombophlebitis. The patient denies recent episodes of angina or shortness of breath.   ABI Rt=0.83 and Lt=0.47 monophasic tibial signal bilaterally  No outpatient medications have been marked as taking for the 05/19/20 encounter (Appointment) with Delana Meyer, Dolores Lory, MD.    Past Medical History:  Diagnosis Date  . History of kidney stones   . Hypertension   . Lung mass   . Squamous cell  carcinoma of lung, right [facility])     Past Surgical History:  Procedure Laterality Date  . COLONOSCOPY WITH PROPOFOL    . COLONOSCOPY WITH PROPOFOL N/A 11/25/2018   Procedure: COLONOSCOPY WITH PROPOFOL;  Surgeon: Lollie Sails, MD;  Location: [facility] ENDOSCOPY;  Service: Endoscopy;  Laterality: N/A;  . ESOPHAGOGASTRODUODENOSCOPY (EGD) WITH PROPOFOL N/A 11/25/2018   Procedure: ESOPHAGOGASTRODUODENOSCOPY (EGD) WITH PROPOFOL;  Surgeon: Lollie Sails, MD;  Location: [facility] ENDOSCOPY;  Service: Endoscopy;  Laterality: N/A;    Social History Social History   Tobacco Use  . Smoking status: Current Every Day Smoker    Packs/day: 1.00    Years: 50.00    Pack years: 50.00    Types: Cigarettes  . Smokeless tobacco: Never Used  Vaping Use  . Vaping Use: Never used  Substance Use Topics  . Alcohol use: Yes    Comment: "1 pint of liquor per day"  . Drug use: Never    Family History Family History  Problem Relation Age of Onset  . Stroke Brother   . Diabetes Mellitus II Brother   . Diabetes Mellitus II Mother   . Diabetes Mellitus II Maternal Uncle   No family history of bleeding/clotting disorders, porphyria or autoimmune disease   No Known Allergies   REVIEW OF SYSTEMS (Negative unless checked)  Constitutional: [] Weight loss  [] Fever  [] Chills Cardiac: [] Chest pain   [] Chest pressure   [] Palpitations   [] Shortness of breath when laying flat   [] Shortness of breath with exertion. Vascular:  [x] Pain in legs  with walking   [x] Pain in legs at rest  [] History of DVT   [] Phlebitis   [] Swelling in legs   [] Varicose veins   [x] Non-healing ulcers Pulmonary:   [] Uses home oxygen   [] Productive cough   [] Hemoptysis   [] Wheeze  [] COPD   [] Asthma Neurologic:  [] Dizziness   [] Seizures   [] History of stroke   [] History of TIA  [] Aphasia   [] Vissual changes   [] Weakness or numbness in arm   [] Weakness or numbness in leg Musculoskeletal:   [] Joint swelling   [] Joint pain   [] Low back  pain Hematologic:  [] Easy bruising  [] Easy bleeding   [] Hypercoagulable state   [] Anemic Gastrointestinal:  [] Diarrhea   [] Vomiting  [] Gastroesophageal reflux/heartburn   [] Difficulty swallowing. Genitourinary:  [] Chronic kidney disease   [] Difficult urination  [] Frequent urination   [] Blood in urine Skin:  [] Rashes   [] Ulcers  Psychological:  [] History of anxiety   []  History of major depression.  Physical Examination  There were no vitals filed for this visit. There is no height or weight on file to calculate BMI. Gen: WD/WN, NAD Head: [place]/AT, No temporalis wasting.  Ear/Nose/Throat: Hearing grossly intact, nares w/o erythema or drainage, poor dentition Eyes: PER, EOMI, sclera nonicteric.  Neck: Supple, no masses.  No bruit or JVD.  Pulmonary:  Good air movement, clear to auscultation bilaterally, no use of accessory muscles.  Cardiac: RRR, normal S1, S2, no Murmurs. Vascular:  Left great toe ulcer noninfected  Vessel Right Left  Radial Palpable Palpable  PT Not Palpable Not Palpable  DP Not Palpable Not Palpable  Gastrointestinal: soft, non-distended. No guarding/no peritoneal signs.  Musculoskeletal: M/S 5/5 throughout.  No deformity or atrophy.  Neurologic: CN 2-12 intact. Pain and light touch intact in extremities.  Symmetrical.  Speech is fluent. Motor exam as listed above. Psychiatric: Judgment intact, Mood & affect appropriate for pt's clinical situation. Dermatologic: No rashes + left great toe ulcers noted.  No changes consistent with cellulitis.   CBC Lab Results  Component Value Date   WBC 4.0 10/14/2017   HGB 12.0 (L) 10/14/2017   HCT 35.1 (L) 10/14/2017   MCV 101.8 (H) 10/14/2017   PLT 137 (L) 10/14/2017    BMET    Component Value Date/Time   NA 136 12/28/2012 0547   K 3.5 12/28/2012 0547   CL 105 12/28/2012 0547   CO2 26 12/28/2012 0547   GLUCOSE 96 12/28/2012 0547   BUN 14 12/28/2012 0547   CREATININE 1.30 (H) 04/17/2018 0756   CREATININE 1.09  12/28/2012 0547   CALCIUM 8.8 12/28/2012 0547   GFRNONAA >60 12/28/2012 0547   GFRAA >60 12/28/2012 0547   CrCl cannot be calculated (Patient's most recent lab result is older than the maximum 21 days allowed.).  COAG Lab Results  Component Value Date   INR 0.92 10/14/2017   INR 0.9 12/26/2012   INR 1.0 06/26/2012    Radiology No results found.    Assessment/Plan 1. Atherosclerosis of native arteries of the extremities with ulceration ([place])  Recommend:  The patient has evidence of severe atherosclerotic changes of both lower extremities associated with ulceration and tissue loss of the left foot.  This represents a limb threatening ischemia and places the patient at the risk for left limb loss.  Patient should undergo angiography of the left lower extremities with the hope for intervention for limb salvage.  The risks and benefits as well as the alternative therapies was discussed in detail with the patient.  All  questions were answered.  Patient agrees to proceed with left leg angiography.  The patient will follow up with me in the office after the procedure.    2. Benign essential hypertension Continue antihypertensive medications as already ordered, these medications have been reviewed and there are no changes at this time.   3. Coronary artery disease of native artery of native heart with stable angina pectoris (HCC) Continue cardiac and antihypertensive medications as already ordered and reviewed, no changes at this time.  Continue statin as ordered and reviewed, no changes at this time  Nitrates PRN for chest pain   4. Hyperlipidemia, mixed Continue statin as ordered and reviewed, no changes at this time    [name], MD  05/19/2020 8:43 AM

## 2020-05-20 ENCOUNTER — Telehealth (INDEPENDENT_AMBULATORY_CARE_PROVIDER_SITE_OTHER): Payer: Self-pay

## 2020-05-20 ENCOUNTER — Other Ambulatory Visit (INDEPENDENT_AMBULATORY_CARE_PROVIDER_SITE_OTHER): Payer: Self-pay | Admitting: Nurse Practitioner

## 2020-05-20 ENCOUNTER — Encounter (INDEPENDENT_AMBULATORY_CARE_PROVIDER_SITE_OTHER): Payer: Self-pay | Admitting: Vascular Surgery

## 2020-05-20 MED ORDER — HYDROCODONE-ACETAMINOPHEN 5-325 MG PO TABS: 1.0000 | ORAL_TABLET | Freq: Four times a day (QID) | ORAL | 0 refills | Status: DC | PRN

## 2020-05-20 NOTE — Telephone Encounter (Signed)
Spoke with the patient and he is scheduled with Dr. Delana Meyer for a left leg angio on 06/07/20 with a 6:45 am arrival time to the [facility]. Covid testing on 06/03/20 between 8-1 pm at the [facility]. Pre-procedure instructions were discussed and will be mailed.

## 2020-06-03 ENCOUNTER — Other Ambulatory Visit: Payer: Medicare Other

## 2020-06-06 ENCOUNTER — Other Ambulatory Visit (INDEPENDENT_AMBULATORY_CARE_PROVIDER_SITE_OTHER): Payer: Self-pay | Admitting: Nurse Practitioner

## 2020-06-07 ENCOUNTER — Other Ambulatory Visit: Payer: Self-pay

## 2020-06-07 ENCOUNTER — Ambulatory Visit
Admission: RE | Admit: 2020-06-07 | Discharge: 2020-06-07 | Disposition: A | Discharge status: Home / Self Care | Payer: Medicare Other | Attending: Vascular Surgery | Admitting: Vascular Surgery

## 2020-06-07 ENCOUNTER — Encounter: Payer: Self-pay | Admitting: Anesthesiology

## 2020-06-07 ENCOUNTER — Encounter
Admission: RE | Disposition: A | Discharge status: Home / Self Care | Payer: Self-pay | Source: Home / Self Care | Attending: Vascular Surgery

## 2020-06-07 ENCOUNTER — Encounter: Payer: Self-pay | Admitting: Vascular Surgery

## 2020-06-07 DIAGNOSIS — E782 Mixed hyperlipidemia: Secondary | ICD-10-CM | POA: Insufficient documentation

## 2020-06-07 DIAGNOSIS — I25118 Atherosclerotic heart disease of native coronary artery with other forms of angina pectoris: Secondary | ICD-10-CM | POA: Diagnosis not present

## 2020-06-07 DIAGNOSIS — I70248 Atherosclerosis of native arteries of left leg with ulceration of other part of lower left leg: Secondary | ICD-10-CM | POA: Diagnosis not present

## 2020-06-07 DIAGNOSIS — I10 Essential (primary) hypertension: Secondary | ICD-10-CM | POA: Diagnosis not present

## 2020-06-07 DIAGNOSIS — F17210 Nicotine dependence, cigarettes, uncomplicated: Secondary | ICD-10-CM | POA: Insufficient documentation

## 2020-06-07 DIAGNOSIS — I70245 Atherosclerosis of native arteries of left leg with ulceration of other part of foot: Secondary | ICD-10-CM | POA: Diagnosis not present

## 2020-06-07 DIAGNOSIS — L97529 Non-pressure chronic ulcer of other part of left foot with unspecified severity: Secondary | ICD-10-CM | POA: Insufficient documentation

## 2020-06-07 DIAGNOSIS — I70299 Other atherosclerosis of native arteries of extremities, unspecified extremity: Secondary | ICD-10-CM

## 2020-06-07 DIAGNOSIS — L97909 Non-pressure chronic ulcer of unspecified part of unspecified lower leg with unspecified severity: Secondary | ICD-10-CM

## 2020-06-07 HISTORY — PX: LOWER EXTREMITY ANGIOGRAPHY: CATH118251

## 2020-06-07 LAB — CREATININE, SERUM
Creatinine, Ser: 1.25 mg/dL — ABNORMAL HIGH (ref 0.61–1.24)
GFR, Estimated: >60 mL/min (ref >60)

## 2020-06-07 LAB — BUN: BUN: 14 mg/dL (ref 8–23)

## 2020-06-07 SURGERY — LOWER EXTREMITY ANGIOGRAPHY
Anesthesia: Moderate Sedation | Site: Leg Lower | Laterality: Left

## 2020-06-07 MED ORDER — OXYCODONE HCL 5 MG PO TABS: 5.0000 mg | ORAL_TABLET | ORAL | Status: DC | PRN

## 2020-06-07 MED ORDER — MIDAZOLAM HCL 5 MG/5ML IJ SOLN
INTRAMUSCULAR | Status: AC
  Filled 2020-06-07: qty 5

## 2020-06-07 MED ORDER — SODIUM CHLORIDE 0.9 % IV SOLN: 250.0000 mL | INTRAVENOUS | Status: DC | PRN

## 2020-06-07 MED ORDER — MORPHINE SULFATE (PF) 4 MG/ML IV SOLN: 2.0000 mg | INTRAVENOUS | Status: DC | PRN

## 2020-06-07 MED ORDER — METHYLPREDNISOLONE SODIUM SUCC 125 MG IJ SOLR: 125.0000 mg | Freq: Once | INTRAMUSCULAR | Status: DC | PRN

## 2020-06-07 MED ORDER — HEPARIN SODIUM (PORCINE) 1000 UNIT/ML IJ SOLN
INTRAMUSCULAR | Status: DC | PRN
  Administered 2020-06-07: 5000 [IU] via INTRAVENOUS

## 2020-06-07 MED ORDER — HYDRALAZINE HCL 20 MG/ML IJ SOLN: 5.0000 mg | INTRAMUSCULAR | Status: DC | PRN

## 2020-06-07 MED ORDER — HYDROMORPHONE HCL 1 MG/ML IJ SOLN: 1.0000 mg | Freq: Once | INTRAMUSCULAR | Status: DC | PRN

## 2020-06-07 MED ORDER — LABETALOL HCL 5 MG/ML IV SOLN: 10.0000 mg | INTRAVENOUS | Status: DC | PRN

## 2020-06-07 MED ORDER — SODIUM CHLORIDE 0.9 % IV SOLN: INTRAVENOUS | Status: DC

## 2020-06-07 MED ORDER — IODIXANOL 320 MG/ML IV SOLN
INTRAVENOUS | Status: DC | PRN
Start: 2020-06-07 — End: 2020-06-07
  Administered 2020-06-07: 60 mL via INTRA_ARTERIAL

## 2020-06-07 MED ORDER — FAMOTIDINE 20 MG PO TABS: 40.0000 mg | ORAL_TABLET | Freq: Once | ORAL | Status: DC | PRN

## 2020-06-07 MED ORDER — MIDAZOLAM HCL 2 MG/ML PO SYRP: 8.0000 mg | ORAL_SOLUTION | Freq: Once | ORAL | Status: DC | PRN

## 2020-06-07 MED ORDER — ONDANSETRON HCL 4 MG/2ML IJ SOLN: 4.0000 mg | Freq: Four times a day (QID) | INTRAMUSCULAR | Status: DC | PRN

## 2020-06-07 MED ORDER — DIPHENHYDRAMINE HCL 50 MG/ML IJ SOLN: 50.0000 mg | Freq: Once | INTRAMUSCULAR | Status: DC | PRN

## 2020-06-07 MED ORDER — ACETAMINOPHEN 325 MG PO TABS: 650.0000 mg | ORAL_TABLET | ORAL | Status: DC | PRN

## 2020-06-07 MED ORDER — FENTANYL CITRATE (PF) 100 MCG/2ML IJ SOLN
INTRAMUSCULAR | Status: DC | PRN
  Administered 2020-06-07: 50 ug via INTRAVENOUS
  Administered 2020-06-07 (×2): 25 ug via INTRAVENOUS

## 2020-06-07 MED ORDER — HEPARIN SODIUM (PORCINE) 1000 UNIT/ML IJ SOLN
INTRAMUSCULAR | Status: AC
  Filled 2020-06-07: qty 1

## 2020-06-07 MED ORDER — CEFAZOLIN SODIUM-DEXTROSE 2-4 GM/100ML-% IV SOLN: 2.0000 g | Freq: Once | INTRAVENOUS | Status: DC

## 2020-06-07 MED ORDER — MIDAZOLAM HCL 2 MG/2ML IJ SOLN
INTRAMUSCULAR | Status: DC | PRN
  Administered 2020-06-07 (×2): 0.5 mg via INTRAVENOUS
  Administered 2020-06-07: 2 mg via INTRAVENOUS
  Administered 2020-06-07 (×2): 1 mg via INTRAVENOUS

## 2020-06-07 MED ORDER — CLOPIDOGREL BISULFATE 75 MG PO TABS: 75.0000 mg | ORAL_TABLET | Freq: Every day | ORAL | 11 refills | Status: DC

## 2020-06-07 MED ORDER — SODIUM CHLORIDE 0.9% FLUSH: 3.0000 mL | Freq: Two times a day (BID) | INTRAVENOUS | Status: DC

## 2020-06-07 MED ORDER — SODIUM CHLORIDE 0.9% FLUSH: 3.0000 mL | INTRAVENOUS | Status: DC | PRN

## 2020-06-07 MED ORDER — CEFAZOLIN SODIUM-DEXTROSE 2-4 GM/100ML-% IV SOLN
INTRAVENOUS | Status: AC
  Filled 2020-06-07: qty 100

## 2020-06-07 MED ORDER — CLOPIDOGREL BISULFATE 300 MG PO TABS
300.0000 mg | ORAL_TABLET | ORAL | Status: AC
  Administered 2020-06-07: 300 mg via ORAL

## 2020-06-07 MED ORDER — FENTANYL CITRATE (PF) 100 MCG/2ML IJ SOLN
INTRAMUSCULAR | Status: AC
  Filled 2020-06-07: qty 2

## 2020-06-07 MED ORDER — CLOPIDOGREL BISULFATE 75 MG PO TABS
ORAL_TABLET | ORAL | Status: AC
  Filled 2020-06-07: qty 4

## 2020-06-07 SURGICAL SUPPLY — 20 items
BALLN LUTONIX DCB 7X40X130 (BALLOONS) ×2
BALLN LUTONIX DCB 7X60X130 (BALLOONS) ×2
BALLOON LUTONIX DCB 7X40X130 (BALLOONS) ×1 IMPLANT
BALLOON LUTONIX DCB 7X60X130 (BALLOONS) ×1 IMPLANT
CANNULA 5F STIFF (CANNULA) ×2 IMPLANT
CATH ANGIO 5F PIGTAIL 65CM (CATHETERS) ×2 IMPLANT
COVER EZ STRL 42X30 (DRAPES) ×2 IMPLANT
COVER PROBE U/S 5X48 (MISCELLANEOUS) ×2 IMPLANT
DEVICE STARCLOSE SE CLOSURE (Vascular Products) ×2 IMPLANT
GLIDEWIRE ADV .035X260CM (WIRE) ×2 IMPLANT
KIT ENCORE 26 ADVANTAGE (KITS) ×2 IMPLANT
PACK ANGIOGRAPHY (CUSTOM PROCEDURE TRAY) ×2 IMPLANT
SHEATH BALKIN 6FR (SHEATH) ×2 IMPLANT
SHEATH BRITE TIP 5FRX11 (SHEATH) ×2 IMPLANT
SHIELD X-DRAPE GOLD 12X17 (MISCELLANEOUS) ×2 IMPLANT
STENT LIFESTAR 9X40 (Permanent Stent) ×2 IMPLANT
STENT LIFESTAR 9X60 (Permanent Stent) ×2 IMPLANT
SYR MEDRAD MARK 7 150ML (SYRINGE) ×2 IMPLANT
TUBING CONTRAST HIGH PRESS 72 (TUBING) ×2 IMPLANT
WIRE GUIDERIGHT .035X150 (WIRE) ×2 IMPLANT

## 2020-06-07 NOTE — OR Nursing (Signed)
Please disregard any low O2 sats, pt has cold fingers and oxygen sensor has had trouble reading

## 2020-06-07 NOTE — Discharge Instructions (Signed)

## 2020-06-07 NOTE — Op Note (Signed)
[facility] Percutaneous Study/Intervention Procedural Note   Date of Surgery: 06/07/2020  Surgeon:  Katha Cabal, MD.  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 Introduction catheter into left lower extremity 3rd order catheter placement  2. Contrast injection left lower extremity for distal runoff   3. Percutaneous transluminal angioplasty and stent placement left external iliac artery  4. Star close closure right common femoral arteriotomy  Anesthesia: Conscious sedation was administered under my direct supervision by the interventional radiology RN. IV Versed plus fentanyl were utilized. Continuous ECG, pulse oximetry and blood pressure was monitored throughout the entire procedure.  Conscious sedation was for a total of 47 minutes and 36 seconds.  Sheath: 6 [place] Balkan right common femoral retrograde  Contrast: 60 cc  Fluoroscopy Time: 6.5 minutes  Indications: Samuel Carney presents with profound atherosclerotic occlusive disease in association with ulceration of the left foot.  This represents possible limb loss.  The risks and benefits of angiography and intervention for limb salvage are reviewed all questions answered patient agrees to proceed.  Procedure: Samuel Carney is a 75 y.o. y.o. male who was identified and appropriate procedural time out was performed. The patient was then placed supine on the table and prepped and draped in the usual sterile fashion.   Ultrasound was placed in the sterile sleeve and the right groin was evaluated the right common femoral artery was echolucent and pulsatile indicating patency.  Image was recorded for the permanent record and under real-time visualization a microneedle was inserted into the common femoral artery  microwire followed by a micro-sheath.  A J-wire was then advanced through the micro-sheath and a  5 [place] sheath was then inserted over a J-wire. J-wire was then advanced and a 5 French pigtail catheter was positioned at the level of T12. AP projection of the aorta was then obtained. Pigtail catheter was repositioned to above the bifurcation and a RAO view of the pelvis was obtained.  Subsequently a pigtail catheter with the stiff angle Glidewire was used to cross the aortic bifurcation the catheter wire were advanced down into the left distal external iliac artery. Oblique view of the femoral bifurcation was then obtained and subsequently the wire was reintroduced and the pigtail catheter negotiated into the SFA representing third order catheter placement. Distal runoff was then performed.  Diagnostic interpretation: The abdominal aorta is opacified with a bolus injection contrast.  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  There is a shelf like plaque formation within the aorta but this is less than 50%.  The aortic bifurcation is widely patent.  There is diffuse calcific disease noted in the common iliac arteries bilaterally but there are no hemodynamically significant stenoses.  On the left there are tandem greater than 80% stenoses in the external iliac artery.  Right external iliac artery is diffusely diseased but there are no flow-limiting lesions.  Internal iliac artery is patent on the right it appears to occlude on the left.  The left common femoral artery demonstrates a greater than 70% stenosis throughout the majority of its length.  In the distal common femoral essentially at the origin of the superficial femoral there is a 90% stenosis.  The origin of the profunda femoris is widely patent profunda femoris is diffusely diseased throughout.  The SFA is diffusely diseased and there are multiple greater than 80% lesions  particularly at the level of  Hunter's canal.  Popliteal artery occludes in its midportion.  Trifurcation is occluded.  There is reconstitution of the posterior tibial which is the dominant runoff to the foot and appears to be a relatively good artery.  It is free of hemodynamically significant stenosis filling the plantar vessels and the pedal arch.  There is faint filling of the peroneal but it appears small and diffusely diseased.  There is nonvisualization of the anterior tibial from its origin throughout its course including the dorsalis pedis.  5000 units of heparin was then given and allowed to circulate and a 6 [place] Balkan sheath was advanced up and over the bifurcation and positioned in the mid left common iliac artery  Magnified imaging in the LAO projection of the left external iliac artery was then obtained.  A 9 mm x 60 mm life star stent and a 9 mm x 40 mm life star stent is then deployed in tandem.  The stents were postdilated with a 7 mm x 60 Lutonix drug-eluting balloon and a 7 mm x 40 mm Lutonix drug-eluting balloon.  Follow-up imaging demonstrates less than 5% residual stenosis throughout the external iliac with preservation of the distal outflow.  After review of these images the sheath is pulled into the right external iliac oblique of the common femoral is obtained and a Star close device deployed. There no immediate complications.   Findings:  The abdominal aorta is opacified with a bolus injection contrast.  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  There is a shelf like plaque formation within the aorta but this is less than 50%.  The aortic bifurcation is widely patent.  There is diffuse calcific disease noted in the common iliac arteries bilaterally but there are no hemodynamically significant stenoses.  On the left there are tandem greater than 80% stenoses in the external iliac artery.  Right external iliac artery is diffusely diseased but there are no  flow-limiting lesions.  Internal iliac artery is patent on the right it appears to occlude on the left.  The left common femoral artery demonstrates a greater than 70% stenosis throughout the majority of its length.  In the distal common femoral essentially at the origin of the superficial femoral there is a 90% stenosis.  The origin of the profunda femoris is widely patent profunda femoris is diffusely diseased throughout.  The SFA is diffusely diseased and there are multiple greater than 80% lesions particularly at the level of Hunter's canal.  Popliteal artery occludes in its midportion.  Trifurcation is occluded.  There is reconstitution of the posterior tibial which is the dominant runoff to the foot and appears to be a relatively good artery.  It is free of hemodynamically significant stenosis filling the plantar vessels and the pedal arch.  There is faint filling of the peroneal but it appears small and diffusely diseased.  There is nonvisualization of the anterior tibial from its origin throughout its course including the dorsalis pedis.  Following angioplasty and stent placement there is now wide patency with less than 5% residual stenosis of the left external iliac artery.     Summary: Successful recanalization left lower extremity inflow for limb salvage.  Given the severe hemodynamically significant lesion within the common femoral as well as the lesion that is at the origin of the superficial femoral artery the patient will require femoral endarterectomy as an open procedure and then continued revascularization with intervention of the  distal SFA popliteal and posterior tibial.  This will be discussed with the patient in the office when he returns.    Disposition: Patient was taken to the recovery room in stable condition having tolerated the procedure well.  Schnier, Dolores Lory 06/07/2020,9:39 AM

## 2020-06-07 NOTE — Interval H&P Note (Signed)
History and Physical Interval Note:  06/07/2020 7:53 AM  Samuel Carney  has presented today for surgery, with the diagnosis of LLE Angio   BARD   ASO w ulceration Covid  March 4.  The various methods of treatment have been discussed with the patient and family. After consideration of risks, benefits and other options for treatment, the patient has consented to  Procedure(s): LOWER EXTREMITY ANGIOGRAPHY (Left) as a surgical intervention.  The patient's history has been reviewed, patient examined, no change in status, stable for surgery.  I have reviewed the patient's chart and labs.  Questions were answered to the patient's satisfaction.     [name]

## 2020-06-09 ENCOUNTER — Other Ambulatory Visit (INDEPENDENT_AMBULATORY_CARE_PROVIDER_SITE_OTHER): Payer: Self-pay | Admitting: Vascular Surgery

## 2020-06-09 ENCOUNTER — Ambulatory Visit: Payer: Medicare Other | Admitting: Nurse Practitioner

## 2020-06-09 DIAGNOSIS — Z95820 Peripheral vascular angioplasty status with implants and grafts: Secondary | ICD-10-CM

## 2020-06-09 NOTE — Progress Notes (Signed)
Cardiology Office Note  Date:  06/10/2020   ID:  Samuel Carney, DOB [birth year], MRN [id number]  PCP:  Juluis Pitch, MD   Chief Complaint  Patient presents with  . Pre-op Exam    Pre-op per Dr. Delana Meyer for cardiac evaluation. Medications reviewed by the patient verbally. "doing well."     HPI:  Samuel Carney is a 75 y.o. male hypertension  lifelong smoker, 50-pack-year smoking history Alcohol, with cirrhosis, followed by GI COPD, active smoker CT scan of the chest  right upper lobe mass.  PET scan  revealed a 2.5 cm right upper lobe mass which was spiculated hypermetabolic concerning for primary bronchogenic carcinoma -Stress test July 2019 He presents for follow-up of his shortness of breath and coronary atherosclerosis on CT scan  LOV: 02/2020 Discussed recent events, underwent procedure with Dr. Ronalee Belts to restore flow in left lower extremity for nonhealing ulcer Reports having rest pain,  has  to pull his leg out of bed put  on the ground overnight several times for discomfort Unable to walk very far  Some shortness of breath  Lab work reviewed  total chol 123, LDL 48  EKG personally reviewed by myself on todays visit NSR rate 61  Other past medical history reviewed Stress test July 2019 no ischemia  Echocardiogram December 2020 normal ejection fraction mild LVH grade 2 diastolic dysfunction moderate MR moderately dilated left atrium mild to moderately elevated right heart pressures  Followed by GI for 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 Faith Rogue for consultation of his possible lung cancer Felt to be stage I carcinoma the lung based on CT scan and PET scan Biopsy performed 10/13/2017  CT scan with extensive coronary atherosclerosis Mild diffuse carotid disease aortic  disease Moderate to heavy coronary calcification all 3 vessels  Last chest CT scan October 2020, followed by oncology Stable appearance of post radiation fibrosis and consolidation anterior right lung apex, appears nodule  PMH:   has a past medical history of History of kidney stones, Hypertension, Lung mass, and Squamous cell carcinoma of lung, right ([place]).smoker, cOPD, coronary artery disease  PSH:    Past Surgical History:  Procedure Laterality Date  . COLONOSCOPY WITH PROPOFOL    . COLONOSCOPY WITH PROPOFOL N/A 11/25/2018   Procedure: COLONOSCOPY WITH PROPOFOL;  Surgeon: Lollie Sails, MD;  Location: [facility] ENDOSCOPY;  Service: Endoscopy;  Laterality: N/A;  . ESOPHAGOGASTRODUODENOSCOPY (EGD) WITH PROPOFOL N/A 11/25/2018   Procedure: ESOPHAGOGASTRODUODENOSCOPY (EGD) WITH PROPOFOL;  Surgeon: Lollie Sails, MD;  Location: [facility] ENDOSCOPY;  Service: Endoscopy;  Laterality: N/A;  . LOWER EXTREMITY ANGIOGRAPHY Left 06/07/2020   Procedure: LOWER EXTREMITY ANGIOGRAPHY;  Surgeon: Katha Cabal, MD;  Location: [place] CV LAB;  Service: Cardiovascular;  Laterality: Left;    Current Outpatient Medications  Medication Sig Dispense Refill  . acetaminophen (TYLENOL) 500 MG tablet Take 1,000 mg by mouth.    Marland Kitchen amLODipine (NORVASC) 10 MG tablet Take 1 tablet by mouth daily.    . clopidogrel (PLAVIX) 75 MG tablet Take 1 tablet (75 mg total) by mouth daily. 30 tablet 11  . cyanocobalamin (,VITAMIN B-12,) 1000 MCG/ML injection Inject 1,000 mcg into the muscle every 30 (thirty) days.    . dorzolamide-timolol (COSOPT) 22.3-6.8 MG/ML ophthalmic solution Place 1 drop into both eyes every 8 (eight) hours.  6  .  doxazosin (CARDURA) 2 MG tablet Take 2 mg by mouth daily.  1  . HYDROcodone-acetaminophen (NORCO/VICODIN) 5-325 MG tablet Take 1-2 tablets by mouth every 6 (six) hours as needed for moderate pain or severe pain. 30 tablet 0  . metoprolol tartrate (LOPRESSOR) 100 MG tablet Take 1 tablet by  mouth 2 (two) times daily.    . pantoprazole (PROTONIX) 40 MG tablet Take 40 mg by mouth daily.    . potassium chloride SA (KLOR-CON) 20 MEQ tablet Take 1 tablet by mouth daily.    . rosuvastatin (CRESTOR) 5 MG tablet Take 1 tablet by mouth once daily 90 tablet 3  . timolol (TIMOPTIC) 0.25 % ophthalmic solution     . Travoprost, BAK Free, (TRAVATAN) 0.004 % SOLN ophthalmic solution once daily.    Marland Kitchen telmisartan (MICARDIS) 80 MG tablet Take 1 tablet by mouth daily.     No current facility-administered medications for this visit.     Allergies:   Patient has no known allergies.   Social History:  The patient  reports that he has been smoking cigarettes. He has a 50.00 pack-year smoking history. He has never used smokeless tobacco. He reports current alcohol use. He reports that he does not use drugs.   Family History:   family history includes Diabetes Mellitus II in his brother, maternal uncle, and mother; Stroke in his brother.    Review of Systems: Review of Systems  Constitutional: Negative.   Respiratory: Positive for shortness of breath.   Cardiovascular: Negative.   Gastrointestinal: Negative.   Musculoskeletal: Negative.   Neurological: Negative.   Psychiatric/Behavioral: Negative.   All other systems reviewed and are negative.    PHYSICAL EXAM: VS:  BP 120/60 (BP Location: Left Arm, Patient Position: Sitting, Cuff Size: Normal)   Pulse 61   Ht 5\' 11"  (1.803 m)   Wt 156 lb (70.8 kg)   BMI 21.76 kg/m  , BMI Body mass index is 21.76 kg/m. Constitutional:  oriented to person, place, and time. No distress.  HENT:  Head: Grossly normal Eyes:  no discharge. No scleral icterus.  Neck: No JVD, no carotid bruits  Cardiovascular: Regular rate and rhythm, no murmurs appreciated Pulmonary/Chest: Clear to auscultation bilaterally, no wheezes or rails Abdominal: Soft.  no distension.  no tenderness.  Musculoskeletal: Normal range of motion Neurological:  normal muscle tone.  Coordination normal. No atrophy Skin: Skin warm and dry Psychiatric: normal affect, pleasant   Recent Labs: 06/07/2020: BUN 14; Creatinine, Ser 1.25    Lipid Panel No results found for: CHOL, HDL, LDLCALC, TRIG    Wt Readings from Last 3 Encounters:  06/10/20 156 lb (70.8 kg)  06/07/20 150 lb (68 kg)  05/19/20 151 lb (68.5 kg)       ASSESSMENT AND PLAN:  Preop cardiovascular evaluation Significant vascular disease, long history of smoking Clinically treadmill Recommended pharmacologic Myoview for risk stratification prior to vascular surgery  Malignant 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 [facility]) Completed radiation, followed by oncology Last CT scan end of 2020 Followed by Dr. Leron Croak with stable angina Heavy coronary calcification all 3 vessels as well as diffuse aortic atherosclerosis Stress testing as above for preop evaluation Smoking cessation recommended  Tobacco abuse Cessation recommended, different modalities discussed Still no desire to quit  Benign essential hypertension Blood pressure is well controlled on today's visit. No changes made to the medications.  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    Signed, Esmond Plants, M.D., Ph.D. 06/10/2020  [place], [place]

## 2020-06-10 ENCOUNTER — Ambulatory Visit: Payer: Medicare Other | Admitting: Cardiovascular Disease

## 2020-06-10 ENCOUNTER — Other Ambulatory Visit: Payer: Self-pay

## 2020-06-10 ENCOUNTER — Encounter: Payer: Self-pay | Admitting: Cardiovascular Disease

## 2020-06-10 VITALS — BP 120/60 | HR 61 | Ht 71.0 in | Wt 156.0 lb

## 2020-06-10 DIAGNOSIS — I10 Essential (primary) hypertension: Secondary | ICD-10-CM

## 2020-06-10 DIAGNOSIS — I209 Angina pectoris, unspecified: Secondary | ICD-10-CM | POA: Diagnosis not present

## 2020-06-10 DIAGNOSIS — E782 Mixed hyperlipidemia: Secondary | ICD-10-CM

## 2020-06-10 DIAGNOSIS — I25118 Atherosclerotic heart disease of native coronary artery with other forms of angina pectoris: Secondary | ICD-10-CM

## 2020-06-10 DIAGNOSIS — Z01818 Encounter for other preprocedural examination: Secondary | ICD-10-CM

## 2020-06-10 NOTE — Patient Instructions (Addendum)
Medication Instructions:  No changes  Lab work: No new labs needed  Testing/Procedures: [place]ane Call for CAD/angian/preop vascular surgery [place]  Your caregiver has ordered a Stress Test with nuclear imaging. The purpose of this test is to evaluate the blood supply to your heart muscle. This procedure is referred to as a "Non-Invasive Stress Test." This is because other than having an IV started in your vein, nothing is inserted or "invades" your body. Cardiac stress tests are done to find areas of poor blood flow to the heart by determining the extent of coronary artery disease (CAD). Some patients exercise on a treadmill, which naturally increases the blood flow to your heart, while others who are  unable to walk on a treadmill due to physical limitations have a pharmacologic/chemical stress agent called Lexiscan . This medicine will mimic walking on a treadmill by temporarily increasing your coronary blood flow.   Please note: these test may take anywhere between 2-4 hours to complete  PLEASE REPORT TO [facility] AT THE FIRST DESK WILL DIRECT YOU WHERE TO GO  Date of Procedure:_____________________________________  Arrival Time for Procedure:______________________________  Instructions regarding medication:    _X___:  Hold betablocker(s) night before procedure and morning of procedure  Metoprolol Tartrate   PLEASE NOTIFY THE OFFICE AT LEAST 24 HOURS IN ADVANCE IF YOU ARE UNABLE TO KEEP YOUR APPOINTMENT.  [phone] AND  PLEASE NOTIFY NUCLEAR MEDICINE AT [facility] AT LEAST 24 HOURS IN ADVANCE IF YOU ARE UNABLE TO KEEP YOUR APPOINTMENT. [phone]  How to prepare for your Myoview test:  1. Do not eat or drink after midnight 2. No caffeine for 24 hours prior to test 3. No smoking 24 hours prior to test. 4. Your medication may be taken with water.  If your doctor stopped a medication because of this test, do not take that  medication. 5. Wear comfortable walking shoes. No heels!    Follow-Up:  . You will need a follow up appointment in 6 months, app ok  . Providers on your designated Care Team:   . Murray Hodgkins, NP . Christell Faith, PA-C . Marrianne Mood, PA-C

## 2020-06-12 NOTE — H&P (View-Only) (Signed)
MRN : [id number]  Samuel Carney is a 75 y.o. (09-07-1945) male who presents with chief complaint of No chief complaint on file. Samuel Carney  History of Present Illness:   The patient returns to the office for followup and review status post left leg angiogram without intervention. The patient notes his lower extremity symptoms continue. His pain has increased.  No interval changeg of the patient's claudication distance or rest pain symptoms. Previous wounds have actually worsened.    There have been no significant changes to the patient's overall health care.  The patient denies amaurosis fugax or recent TIA symptoms. There are no recent neurological changes noted. The patient denies history of DVT, PE or superficial thrombophlebitis. The patient denies recent episodes of angina or shortness of breath.   ABI Rt=0.90 and Lt=0.67 (toe tracing are flat)  No outpatient medications have been marked as taking for the 06/13/20 encounter (Appointment) with Delana Meyer, Dolores Lory, MD.    Past Medical History:  Diagnosis Date  . History of kidney stones   . Hypertension   . Lung mass   . Squamous cell carcinoma of lung, right [facility])     Past Surgical History:  Procedure Laterality Date  . COLONOSCOPY WITH PROPOFOL    . COLONOSCOPY WITH PROPOFOL N/A 11/25/2018   Procedure: COLONOSCOPY WITH PROPOFOL;  Surgeon: Lollie Sails, MD;  Location: [facility] ENDOSCOPY;  Service: Endoscopy;  Laterality: N/A;  . ESOPHAGOGASTRODUODENOSCOPY (EGD) WITH PROPOFOL N/A 11/25/2018   Procedure: ESOPHAGOGASTRODUODENOSCOPY (EGD) WITH PROPOFOL;  Surgeon: Lollie Sails, MD;  Location: [facility] ENDOSCOPY;  Service: Endoscopy;  Laterality: N/A;  . LOWER EXTREMITY ANGIOGRAPHY Left 06/07/2020   Procedure: LOWER EXTREMITY ANGIOGRAPHY;  Surgeon: Katha Cabal, MD;  Location: [place] CV LAB;  Service: Cardiovascular;  Laterality: Left;    Social History Social History   Tobacco Use  . Smoking status: Current Every Day  Smoker    Packs/day: 1.00    Years: 50.00    Pack years: 50.00    Types: Cigarettes  . Smokeless tobacco: Never Used  Vaping Use  . Vaping Use: Never used  Substance Use Topics  . Alcohol use: Yes    Comment: "1 pint of liquor per day"  . Drug use: Never    Family History Family History  Problem Relation Age of Onset  . Stroke Brother   . Diabetes Mellitus II Brother   . Diabetes Mellitus II Mother   . Diabetes Mellitus II Maternal Uncle     No Known Allergies   REVIEW OF SYSTEMS (Negative unless checked)  Constitutional: [] Weight loss  [] Fever  [] Chills Cardiac: [] Chest pain   [] Chest pressure   [] Palpitations   [] Shortness of breath when laying flat   [] Shortness of breath with exertion. Vascular:  [x] Pain in legs with walking   [x] Pain in legs at rest  [] History of DVT   [] Phlebitis   [] Swelling in legs   [] Varicose veins   [x] Non-healing ulcers Pulmonary:   [] Uses home oxygen   [] Productive cough   [] Hemoptysis   [] Wheeze  [] COPD   [] Asthma Neurologic:  [] Dizziness   [] Seizures   [] History of stroke   [] History of TIA  [] Aphasia   [] Vissual changes   [] Weakness or numbness in arm   [x] Weakness or numbness in leg Musculoskeletal:   [] Joint swelling   [] Joint pain   [] Low back pain Hematologic:  [] Easy bruising  [] Easy bleeding   [] Hypercoagulable state   [] Anemic Gastrointestinal:  [] Diarrhea   [] Vomiting  [] Gastroesophageal  reflux/heartburn   [] Difficulty swallowing. Genitourinary:  [] Chronic kidney disease   [] Difficult urination  [] Frequent urination   [] Blood in urine Skin:  [] Rashes   [x] Ulcers  Psychological:  [] History of anxiety   []  History of major depression.  Physical Examination  There were no vitals filed for this visit. There is no height or weight on file to calculate BMI. Gen: WD/WN, NAD Head: [place]/AT, No temporalis wasting.  Ear/Nose/Throat: Hearing grossly intact, nares w/o erythema or drainage Eyes: PER, EOMI, sclera nonicteric.  Neck: Supple, no  large masses.   Pulmonary:  Good air movement, no audible wheezing bilaterally, no use of accessory muscles.  Cardiac: RRR, no JVD Vascular:  Multiple ulcers on the dorsum of 3 of 5 toes Vessel Right Left  Radial Palpable Palpable  PT Not Palpable Not Palpable  DP Not Palpable Not Palpable  Gastrointestinal: Non-distended. No guarding/no peritoneal signs.  Musculoskeletal: M/S 5/5 throughout.  No deformity or atrophy.  Neurologic: CN 2-12 intact. Symmetrical.  Speech is fluent. Motor exam as listed above. Psychiatric: Judgment intact, Mood & affect appropriate for pt's clinical situation. Dermatologic: No rashes + ulcers noted.  No changes consistent with cellulitis.   CBC Lab Results  Component Value Date   WBC 4.0 10/14/2017   HGB 12.0 (L) 10/14/2017   HCT 35.1 (L) 10/14/2017   MCV 101.8 (H) 10/14/2017   PLT 137 (L) 10/14/2017    BMET    Component Value Date/Time   NA 136 12/28/2012 0547   K 3.5 12/28/2012 0547   CL 105 12/28/2012 0547   CO2 26 12/28/2012 0547   GLUCOSE 96 12/28/2012 0547   BUN 14 06/07/2020 0720   BUN 14 12/28/2012 0547   CREATININE 1.25 (H) 06/07/2020 0720   CREATININE 1.09 12/28/2012 0547   CALCIUM 8.8 12/28/2012 0547   GFRNONAA >60 06/07/2020 0720   GFRNONAA >60 12/28/2012 0547   GFRAA >60 12/28/2012 0547   Estimated Creatinine Clearance: 52.7 mL/min (A) (by C-G formula based on SCr of 1.25 mg/dL (H)).  COAG Lab Results  Component Value Date   INR 0.92 10/14/2017   INR 0.9 12/26/2012   INR 1.0 06/26/2012    Radiology PERIPHERAL VASCULAR CATHETERIZATION  Result Date: 06/07/2020 See Op Note  VAS [place] ABI WITH/WO TBI  Result Date: 05/19/2020 LOWER EXTREMITY DOPPLER STUDY Indications: Peripheral artery disease. Other Factors: Left foot pain.  Performing Technologist: Concha Norway RVT  Examination Guidelines: A complete evaluation includes at minimum, Doppler waveform signals and systolic blood pressure reading at the level of bilateral  brachial, anterior tibial, and posterior tibial arteries, when vessel segments are accessible. Bilateral testing is considered an integral part of a complete examination. Photoelectric Plethysmograph (PPG) waveforms and toe systolic pressure readings are included as required and additional duplex testing as needed. Limited examinations for reoccurring indications may be performed as noted.  ABI Findings: +---------+------------------+-----+----------+--------+ Right    Rt Pressure (mmHg)IndexWaveform  Comment  +---------+------------------+-----+----------+--------+ Brachial 118                                       +---------+------------------+-----+----------+--------+ ATA      84                0.71 monophasic         +---------+------------------+-----+----------+--------+ PTA      98                0.83 biphasic           +---------+------------------+-----+----------+--------+  Great Toe90                0.76 Abnormal           +---------+------------------+-----+----------+--------+ +---------+------------------+-----+----------+-------+ Left     Lt Pressure (mmHg)IndexWaveform  Comment +---------+------------------+-----+----------+-------+ ATA      56                0.47 monophasic        +---------+------------------+-----+----------+-------+ PTA      86                0.73 monophasic        +---------+------------------+-----+----------+-------+ Great Toe36                0.31 Abnormal          +---------+------------------+-----+----------+-------+  Summary: Right: Resting right ankle-brachial index indicates mild right lower extremity arterial disease. The right toe-brachial index is normal. Left: Resting left ankle-brachial index indicates moderate left lower extremity arterial disease. The left toe-brachial index is abnormal.  *See table(s) above for measurements and observations.  Electronically signed by [name] MD on 05/19/2020 at 5:16:28  PM.   Final      Assessment/Plan 1. Atherosclerosis of native arteries of the extremities with ulceration ([place])  Recommend:  The patient has evidence of severe atherosclerotic changes of both lower extremities associated with ulceration and tissue loss of the left foot.  This represents a limb threatening ischemia and places the patient at the risk for left lower limb loss.  Angiography has been performed and the situation is not ideal for intervention.  Given this finding open surgical repair is recommended.   Patient should undergo arterial reconstruction of the lower extremity with the hope for limb salvage.  The risks and benefits as well as the alternative therapies was discussed in detail with the patient.  All questions were answered.  Patient agrees to proceed with left femoral endarterectomy with stenting of the left SFA.  Once cardiac clearance will be obtained and we will move forward.  The patient will follow up with me in the office after the procedure.   2. Benign essential hypertension Continue antihypertensive medications as already ordered, these medications have been reviewed and there are no changes at this time.   3. Coronary artery disease of native artery of native heart with stable angina pectoris (HCC) Continue cardiac and antihypertensive medications as already ordered and reviewed, no changes at this time.  Continue statin as ordered and reviewed, no changes at this time  Nitrates PRN for chest pain   4. Hyperlipidemia, mixed Continue statin as ordered and reviewed, no changes at this time     [name], MD  06/12/2020 11:10 AM

## 2020-06-12 NOTE — Progress Notes (Signed)
MRN : [id number]  Samuel Carney is a 75 y.o. (Mar 17, 1946) male who presents with chief complaint of No chief complaint on file. Marland Kitchen  History of Present Illness:   The patient returns to the office for followup and review status post left leg angiogram without intervention. The patient notes his lower extremity symptoms continue. His pain has increased.  No interval changeg of the patient's claudication distance or rest pain symptoms. Previous wounds have actually worsened.    There have been no significant changes to the patient's overall health care.  The patient denies amaurosis fugax or recent TIA symptoms. There are no recent neurological changes noted. The patient denies history of DVT, PE or superficial thrombophlebitis. The patient denies recent episodes of angina or shortness of breath.   ABI Rt=0.90 and Lt=0.67 (toe tracing are flat)  No outpatient medications have been marked as taking for the 06/13/20 encounter (Appointment) with Delana Meyer, Dolores Lory, MD.    Past Medical History:  Diagnosis Date  . History of kidney stones   . Hypertension   . Lung mass   . Squamous cell carcinoma of lung, right [facility])     Past Surgical History:  Procedure Laterality Date  . COLONOSCOPY WITH PROPOFOL    . COLONOSCOPY WITH PROPOFOL N/A 11/25/2018   Procedure: COLONOSCOPY WITH PROPOFOL;  Surgeon: Lollie Sails, MD;  Location: [facility] ENDOSCOPY;  Service: Endoscopy;  Laterality: N/A;  . ESOPHAGOGASTRODUODENOSCOPY (EGD) WITH PROPOFOL N/A 11/25/2018   Procedure: ESOPHAGOGASTRODUODENOSCOPY (EGD) WITH PROPOFOL;  Surgeon: Lollie Sails, MD;  Location: [facility] ENDOSCOPY;  Service: Endoscopy;  Laterality: N/A;  . LOWER EXTREMITY ANGIOGRAPHY Left 06/07/2020   Procedure: LOWER EXTREMITY ANGIOGRAPHY;  Surgeon: Katha Cabal, MD;  Location: [place] CV LAB;  Service: Cardiovascular;  Laterality: Left;    Social History Social History   Tobacco Use  . Smoking status: Current Every Day  Smoker    Packs/day: 1.00    Years: 50.00    Pack years: 50.00    Types: Cigarettes  . Smokeless tobacco: Never Used  Vaping Use  . Vaping Use: Never used  Substance Use Topics  . Alcohol use: Yes    Comment: "1 pint of liquor per day"  . Drug use: Never    Family History Family History  Problem Relation Age of Onset  . Stroke Brother   . Diabetes Mellitus II Brother   . Diabetes Mellitus II Mother   . Diabetes Mellitus II Maternal Uncle     No Known Allergies   REVIEW OF SYSTEMS (Negative unless checked)  Constitutional: [] Weight loss  [] Fever  [] Chills Cardiac: [] Chest pain   [] Chest pressure   [] Palpitations   [] Shortness of breath when laying flat   [] Shortness of breath with exertion. Vascular:  [x] Pain in legs with walking   [x] Pain in legs at rest  [] History of DVT   [] Phlebitis   [] Swelling in legs   [] Varicose veins   [x] Non-healing ulcers Pulmonary:   [] Uses home oxygen   [] Productive cough   [] Hemoptysis   [] Wheeze  [] COPD   [] Asthma Neurologic:  [] Dizziness   [] Seizures   [] History of stroke   [] History of TIA  [] Aphasia   [] Vissual changes   [] Weakness or numbness in arm   [x] Weakness or numbness in leg Musculoskeletal:   [] Joint swelling   [] Joint pain   [] Low back pain Hematologic:  [] Easy bruising  [] Easy bleeding   [] Hypercoagulable state   [] Anemic Gastrointestinal:  [] Diarrhea   [] Vomiting  [] Gastroesophageal  reflux/heartburn   [] Difficulty swallowing. Genitourinary:  [] Chronic kidney disease   [] Difficult urination  [] Frequent urination   [] Blood in urine Skin:  [] Rashes   [x] Ulcers  Psychological:  [] History of anxiety   []  History of major depression.  Physical Examination  There were no vitals filed for this visit. There is no height or weight on file to calculate BMI. Gen: WD/WN, NAD Head: /AT, No temporalis wasting.  Ear/Nose/Throat: Hearing grossly intact, nares w/o erythema or drainage Eyes: PER, EOMI, sclera nonicteric.  Neck: Supple, no  large masses.   Pulmonary:  Good air movement, no audible wheezing bilaterally, no use of accessory muscles.  Cardiac: RRR, no JVD Vascular:  Multiple ulcers on the dorsum of 3 of 5 toes Vessel Right Left  Radial Palpable Palpable  PT Not Palpable Not Palpable  DP Not Palpable Not Palpable  Gastrointestinal: Non-distended. No guarding/no peritoneal signs.  Musculoskeletal: M/S 5/5 throughout.  No deformity or atrophy.  Neurologic: CN 2-12 intact. Symmetrical.  Speech is fluent. Motor exam as listed above. Psychiatric: Judgment intact, Mood & affect appropriate for pt's clinical situation. Dermatologic: No rashes + ulcers noted.  No changes consistent with cellulitis.   CBC Lab Results  Component Value Date   WBC 4.0 10/14/2017   HGB 12.0 (L) 10/14/2017   HCT 35.1 (L) 10/14/2017   MCV 101.8 (H) 10/14/2017   PLT 137 (L) 10/14/2017    BMET    Component Value Date/Time   NA 136 12/28/2012 0547   K 3.5 12/28/2012 0547   CL 105 12/28/2012 0547   CO2 26 12/28/2012 0547   GLUCOSE 96 12/28/2012 0547   BUN 14 06/07/2020 0720   BUN 14 12/28/2012 0547   CREATININE 1.25 (H) 06/07/2020 0720   CREATININE 1.09 12/28/2012 0547   CALCIUM 8.8 12/28/2012 0547   GFRNONAA >60 06/07/2020 0720   GFRNONAA >60 12/28/2012 0547   GFRAA >60 12/28/2012 0547   Estimated Creatinine Clearance: 52.7 mL/min (A) (by C-G formula based on SCr of 1.25 mg/dL (H)).  COAG Lab Results  Component Value Date   INR 0.92 10/14/2017   INR 0.9 12/26/2012   INR 1.0 06/26/2012    Radiology PERIPHERAL VASCULAR CATHETERIZATION  Result Date: 06/07/2020 See Op Note  VAS [place] ABI WITH/WO TBI  Result Date: 05/19/2020 LOWER EXTREMITY DOPPLER STUDY Indications: Peripheral artery disease. Other Factors: Left foot pain.  Performing Technologist: Concha Norway RVT  Examination Guidelines: A complete evaluation includes at minimum, Doppler waveform signals and systolic blood pressure reading at the level of bilateral  brachial, anterior tibial, and posterior tibial arteries, when vessel segments are accessible. Bilateral testing is considered an integral part of a complete examination. Photoelectric Plethysmograph (PPG) waveforms and toe systolic pressure readings are included as required and additional duplex testing as needed. Limited examinations for reoccurring indications may be performed as noted.  ABI Findings: +---------+------------------+-----+----------+--------+ Right    Rt Pressure (mmHg)IndexWaveform  Comment  +---------+------------------+-----+----------+--------+ Brachial 118                                       +---------+------------------+-----+----------+--------+ ATA      84                0.71 monophasic         +---------+------------------+-----+----------+--------+ PTA      98                0.83 biphasic           +---------+------------------+-----+----------+--------+  Great Toe90                0.76 Abnormal           +---------+------------------+-----+----------+--------+ +---------+------------------+-----+----------+-------+ Left     Lt Pressure (mmHg)IndexWaveform  Comment +---------+------------------+-----+----------+-------+ ATA      56                0.47 monophasic        +---------+------------------+-----+----------+-------+ PTA      86                0.73 monophasic        +---------+------------------+-----+----------+-------+ Great Toe36                0.31 Abnormal          +---------+------------------+-----+----------+-------+  Summary: Right: Resting right ankle-brachial index indicates mild right lower extremity arterial disease. The right toe-brachial index is normal. Left: Resting left ankle-brachial index indicates moderate left lower extremity arterial disease. The left toe-brachial index is abnormal.  *See table(s) above for measurements and observations.  Electronically signed by [name] MD on 05/19/2020 at 5:16:28  PM.   Final      Assessment/Plan 1. Atherosclerosis of native arteries of the extremities with ulceration ([place])  Recommend:  The patient has evidence of severe atherosclerotic changes of both lower extremities associated with ulceration and tissue loss of the left foot.  This represents a limb threatening ischemia and places the patient at the risk for left lower limb loss.  Angiography has been performed and the situation is not ideal for intervention.  Given this finding open surgical repair is recommended.   Patient should undergo arterial reconstruction of the lower extremity with the hope for limb salvage.  The risks and benefits as well as the alternative therapies was discussed in detail with the patient.  All questions were answered.  Patient agrees to proceed with left femoral endarterectomy with stenting of the left SFA.  Once cardiac clearance will be obtained and we will move forward.  The patient will follow up with me in the office after the procedure.   2. Benign essential hypertension Continue antihypertensive medications as already ordered, these medications have been reviewed and there are no changes at this time.   3. Coronary artery disease of native artery of native heart with stable angina pectoris (HCC) Continue cardiac and antihypertensive medications as already ordered and reviewed, no changes at this time.  Continue statin as ordered and reviewed, no changes at this time  Nitrates PRN for chest pain   4. Hyperlipidemia, mixed Continue statin as ordered and reviewed, no changes at this time     [name], MD  06/12/2020 11:10 AM

## 2020-06-13 ENCOUNTER — Ambulatory Visit (INDEPENDENT_AMBULATORY_CARE_PROVIDER_SITE_OTHER): Payer: Medicare Other

## 2020-06-13 ENCOUNTER — Other Ambulatory Visit: Payer: Self-pay

## 2020-06-13 ENCOUNTER — Ambulatory Visit (INDEPENDENT_AMBULATORY_CARE_PROVIDER_SITE_OTHER): Payer: Medicare Other | Admitting: Vascular Surgery

## 2020-06-13 ENCOUNTER — Encounter (INDEPENDENT_AMBULATORY_CARE_PROVIDER_SITE_OTHER): Payer: Self-pay | Admitting: Vascular Surgery

## 2020-06-13 VITALS — BP 110/66 | HR 66 | Ht 71.0 in | Wt 156.0 lb

## 2020-06-13 DIAGNOSIS — E782 Mixed hyperlipidemia: Secondary | ICD-10-CM | POA: Diagnosis not present

## 2020-06-13 DIAGNOSIS — I10 Essential (primary) hypertension: Secondary | ICD-10-CM

## 2020-06-13 DIAGNOSIS — I7025 Atherosclerosis of native arteries of other extremities with ulceration: Secondary | ICD-10-CM | POA: Diagnosis not present

## 2020-06-13 DIAGNOSIS — I25118 Atherosclerotic heart disease of native coronary artery with other forms of angina pectoris: Secondary | ICD-10-CM

## 2020-06-13 DIAGNOSIS — Z95820 Peripheral vascular angioplasty status with implants and grafts: Secondary | ICD-10-CM

## 2020-06-14 ENCOUNTER — Encounter (INDEPENDENT_AMBULATORY_CARE_PROVIDER_SITE_OTHER): Payer: Self-pay | Admitting: Vascular Surgery

## 2020-06-20 ENCOUNTER — Other Ambulatory Visit: Payer: Medicare Other

## 2020-06-22 ENCOUNTER — Other Ambulatory Visit: Payer: Self-pay

## 2020-06-22 ENCOUNTER — Encounter
Admission: RE | Admit: 2020-06-22 | Discharge: 2020-06-22 | Disposition: A | Discharge status: Home / Self Care | Payer: Medicare Other | Source: Ambulatory Visit | Attending: Cardiovascular Disease | Admitting: Cardiovascular Disease

## 2020-06-22 DIAGNOSIS — Z01818 Encounter for other preprocedural examination: Secondary | ICD-10-CM | POA: Insufficient documentation

## 2020-06-22 DIAGNOSIS — I25118 Atherosclerotic heart disease of native coronary artery with other forms of angina pectoris: Secondary | ICD-10-CM | POA: Insufficient documentation

## 2020-06-22 DIAGNOSIS — I209 Angina pectoris, unspecified: Secondary | ICD-10-CM | POA: Diagnosis present

## 2020-06-22 LAB — NM MYOCAR MULTI W/SPECT W/WALL MOTION / EF
Estimated workload: 1 METS
Exercise duration (min): 0 min
Exercise duration (sec): 0 s
LV dias vol: 76 mL (ref 62–150)
LV sys vol: 34 mL
MPHR: 147 {beats}/min
Peak HR: 100 {beats}/min
Percent HR: 68 %
Rest HR: 64 {beats}/min
SDS: 0
SRS: 0
SSS: 0
TID: 1.09

## 2020-06-22 MED ORDER — TECHNETIUM TC 99M TETROFOSMIN IV KIT
10.0000 | PACK | Freq: Once | INTRAVENOUS | Status: AC | PRN
  Administered 2020-06-22: 9.705 via INTRAVENOUS

## 2020-06-22 MED ORDER — TECHNETIUM TC 99M TETROFOSMIN IV KIT
31.5500 | PACK | Freq: Once | INTRAVENOUS | Status: AC | PRN
  Administered 2020-06-22: 31.55 via INTRAVENOUS

## 2020-06-22 MED ORDER — REGADENOSON 0.4 MG/5ML IV SOLN
0.4000 mg | Freq: Once | INTRAVENOUS | Status: AC
  Administered 2020-06-22: 0.4 mg via INTRAVENOUS
  Filled 2020-06-22: qty 5

## 2020-07-07 ENCOUNTER — Telehealth (INDEPENDENT_AMBULATORY_CARE_PROVIDER_SITE_OTHER): Payer: Self-pay

## 2020-07-07 NOTE — Telephone Encounter (Signed)
Spoke with the patient and his spouse and he is scheduled with Dr. Delana Meyer for a left femoral endarterectomy and SFA stent placement on 07/13/20 at [facility]. Pre-op phone call on 07/11/20 between 1-5 pm and covid testing on 07/11/20 between 8-2 pm a the [facility]. Pre-surgical instructions were discussed and will be mailed.

## 2020-07-11 ENCOUNTER — Telehealth: Payer: Self-pay | Admitting: Cardiovascular Disease

## 2020-07-11 ENCOUNTER — Other Ambulatory Visit (INDEPENDENT_AMBULATORY_CARE_PROVIDER_SITE_OTHER): Payer: Self-pay | Admitting: Nurse Practitioner

## 2020-07-11 ENCOUNTER — Other Ambulatory Visit: Payer: Self-pay

## 2020-07-11 ENCOUNTER — Other Ambulatory Visit
Admission: RE | Admit: 2020-07-11 | Discharge: 2020-07-11 | Disposition: A | Discharge status: Home / Self Care | Payer: Medicare Other | Source: Ambulatory Visit | Attending: Vascular Surgery | Admitting: Vascular Surgery

## 2020-07-11 ENCOUNTER — Encounter: Payer: Self-pay | Admitting: Vascular Surgery

## 2020-07-11 ENCOUNTER — Encounter
Admission: RE | Admit: 2020-07-11 | Discharge: 2020-07-11 | Disposition: A | Discharge status: Home / Self Care | Payer: Medicare Other | Source: Ambulatory Visit | Attending: Vascular Surgery | Admitting: Vascular Surgery

## 2020-07-11 DIAGNOSIS — Z01812 Encounter for preprocedural laboratory examination: Secondary | ICD-10-CM | POA: Insufficient documentation

## 2020-07-11 DIAGNOSIS — Z20822 Contact with and (suspected) exposure to covid-19: Secondary | ICD-10-CM | POA: Insufficient documentation

## 2020-07-11 HISTORY — DX: Gastro-esophageal reflux disease without esophagitis: K21.9

## 2020-07-11 HISTORY — DX: Chronic obstructive pulmonary disease, unspecified: J44.9

## 2020-07-11 HISTORY — DX: Pneumonia, unspecified organism: J18.9

## 2020-07-11 HISTORY — DX: Unspecified cirrhosis of liver: K74.60

## 2020-07-11 HISTORY — DX: Deficiency of other specified B group vitamins: E53.8

## 2020-07-11 HISTORY — DX: Personal history of irradiation: Z92.3

## 2020-07-11 HISTORY — DX: Atherosclerotic heart disease of native coronary artery without angina pectoris: I25.10

## 2020-07-11 HISTORY — DX: Unspecified glaucoma: H40.9

## 2020-07-11 LAB — CBC WITH DIFFERENTIAL/PLATELET
Abs Immature Granulocytes: 0.01 10*3/uL (ref 0.00–0.07)
Basophils Absolute: 0.1 10*3/uL (ref 0.0–0.1)
Basophils Relative: 2 %
Eosinophils Absolute: 0.3 10*3/uL (ref 0.0–0.5)
Eosinophils Relative: 5 %
HCT: 33.6 % — ABNORMAL LOW (ref 39.0–52.0)
Hemoglobin: 10.4 g/dL — ABNORMAL LOW (ref 13.0–17.0)
Immature Granulocytes: 0 %
Lymphocytes Relative: 30 %
Lymphs Abs: 1.7 10*3/uL (ref 0.7–4.0)
MCH: 28.3 pg (ref 26.0–34.0)
MCHC: 31 g/dL (ref 30.0–36.0)
MCV: 91.6 fL (ref 80.0–100.0)
Monocytes Absolute: 0.6 10*3/uL (ref 0.1–1.0)
Monocytes Relative: 10 %
Neutro Abs: 3.1 10*3/uL (ref 1.7–7.7)
Neutrophils Relative %: 53 %
Platelets: 137 10*3/uL — ABNORMAL LOW (ref 150–400)
RBC: 3.67 MIL/uL — ABNORMAL LOW (ref 4.22–5.81)
RDW: 15.3 % (ref 11.5–15.5)
WBC: 5.9 10*3/uL (ref 4.0–10.5)
nRBC: 0 % (ref 0.0–0.2)

## 2020-07-11 LAB — APTT: aPTT: 36 seconds (ref 24–36)

## 2020-07-11 LAB — TYPE AND SCREEN
ABO/RH(D): O POS
Antibody Screen: NEGATIVE

## 2020-07-11 LAB — SARS CORONAVIRUS 2 (TAT 6-24 HRS): SARS Coronavirus 2: NEGATIVE

## 2020-07-11 LAB — BASIC METABOLIC PANEL
Anion gap: 7 (ref 5–15)
BUN: 26 mg/dL — ABNORMAL HIGH (ref 8–23)
CO2: 23 mmol/L (ref 22–32)
Calcium: 9 mg/dL (ref 8.9–10.3)
Chloride: 110 mmol/L (ref 98–111)
Creatinine, Ser: 1.28 mg/dL — ABNORMAL HIGH (ref 0.61–1.24)
GFR, Estimated: 59 mL/min — ABNORMAL LOW (ref >60)
Glucose, Bld: 78 mg/dL (ref 70–99)
Potassium: 4 mmol/L (ref 3.5–5.1)
Sodium: 140 mmol/L (ref 135–145)

## 2020-07-11 LAB — PROTIME-INR
INR: 1.1 (ref 0.8–1.2)
Prothrombin Time: 13.6 seconds (ref 11.4–15.2)

## 2020-07-11 NOTE — Telephone Encounter (Signed)
Request for pre-operative cardiac clearance:    1. What type of surgery is being performed?  LEFT FEMORAL ENDARTERECTOMY  "Angiography has been performed and the situation is not ideal for intervention. Given this finding, open surgical repair is recommended".   2. When is this surgery scheduled?  07/13/2020    3. Are there any medications that need to be held prior to surgery?  CLOPIDOGREL   4. Practice name and name of physician performing surgery?  Performing surgeon: Dr. [name], MD  Requesting clearance: [name], [name]     5. Anesthesia type (none, local, MAC, general)? General   6. What is the office phone and fax number?   Phone: [phone]  Fax: [phone]   ATTENTION: Unable to create telephone message as per your standard workflow. Directed by [facility] providers to send requests for cardiac clearance to this pool for appropriate distribution to provider covering pre-operative clearances.   [name], [name], [name], [name], [name]  [facility]  Peri-operative Services Nurse Practitioner  Phone: [phone]  07/11/20 3:46 PM

## 2020-07-11 NOTE — Progress Notes (Signed)
Perioperative Services  Pre-Admission/Anesthesia Testing Clinical Review  Date: 07/12/20  Patient Demographics:  Name: Samuel Carney DOB:   [birth year] MRN:   [id number]  Planned Surgical Procedure(s):    Case: [id number] Date/Time: 07/13/20 0715   Procedures:      ENDARTERECTOMY FEMORAL ( SFA STENT) (Left )     APPLICATION OF CELL SAVER (N/A )   Anesthesia type: General   Pre-op diagnosis: ASO WITH ULCERATION   Location: [facility] [place] [facility]   Surgeons: Katha Cabal, MD    NOTE: Available PAT nursing documentation and vital signs have been reviewed. Clinical nursing staff has updated patient's PMH/PSHx, current medication list, and drug allergies/intolerances to ensure comprehensive history available to assist in medical decision making as it pertains to the aforementioned surgical procedure and anticipated anesthetic course.   Clinical Discussion:  Samuel Carney is a 75 y.o. male who is submitted for pre-surgical anesthesia review and clearance prior to him undergoing the above procedure.Patient is a Current Smoker (50 pack years). Pertinent PMH includes: CAD, aortic atherosclerosis, G2DD, moderate MV regurgitation, PVD, HTN, HLD, COPD, lung cancer (s/p XRT), alcoholic cirrhosis, GERD (on daily PPI), alcohol abuse (consumes 1 pint of whiskey a day).  Patient is followed by cardiology Rockey Situ, MD). He was last seen in the cardiology clinic on 06/10/2020; notes reviewed.  At the time of his clinic visit, patient doing well overall from a cardiovascular perspective.  Patient denied chest pain, however continue to experience exertional dyspnea related to his underlying COPD and lung cancer diagnosis.  Shortness of breath also related to radiation fibrosis following patient's XRT treatments for his lung cancer.  He denied PND, orthopnea, palpitations, peripheral edema, vertiginous symptoms, and presyncope/syncope. PMH significant for CV disease.  Myocardial  perfusion imaging study performed in 09/2017 revealed no evidence of stress-induced myocardial ischemia or arrhythmia; LVEF >65%.  Last TTE on 03/2019 revealed normal left ventricular systolic function (LVEF 32-44%), mild LVH, G2DD, mild to moderate valvular insufficiency, and elevated PASP (see full interpretation of cardiovascular testing below).  Patient on GDMT for his HTN and HLD diagnoses.  Blood pressure well controlled at 120/60 on currently prescribed CCB, alpha blocker, beta-blocker, and ARB therapies.  Patient is on a statin for his HLD.  Patient with significant peripheral vascular disease and is chronically anticoagulated using clopidogrel; compliant with therapy with no evidence of GI bleeding.  Functional capacity, as defined by DASI, is felt to be <4 METS; patient reports that he "cannot walk very far".  Patient being seen prior to planned vascular procedure. Repeat myocardial perfusion imaging study recommended prior to proceeding with higher risk vascular procedure; exam scheduled. No changes were made to patient's medication regimen.  Patient to follow-up with outpatient cardiology at defined intervals for ongoing care and management.   Since being seen by cardiology, patient has undergone the recommended repeat myocardial perfusion imaging study.  Lexiscan performed on 06/22/2020 revealed globally normal systolic function with no evidence of ischemia; LVEF 68%.  Repeat noninvasive study normal and low risk.  Patient is scheduled to undergo a femoral endarterectomy on 07/13/2020 with Dr. [name].  Given patient's past medical history significant for cardiovascular disease, presurgical cardiac clearance was sought by the performing surgeon's office and PAT team.  Per cardiology, "based on patient's past medical history and time since his last clinic visit, patient would be at an overall ACCEPTABLE risk for the planned procedure without further cardiovascular testing or intervention at  this time".  Again, this patient is on daily antiplatelet therapy.  Per Dr. Loistine Chance procedure protocol, seeing whereas this patient is not on concurrent low dose ASA, he should continue the clopidogrel throughout the perioperative period.   Patient denies previous perioperative complications with anesthesia in the past. In review of the available records, it is noted that patient underwent a general anesthetic course here (ASA III) in 11/2018 without documented complications.   Vitals with BMI 07/11/2020 06/13/2020 06/10/2020  Height 5\' 11"  5\' 11"  5\' 11"   Weight 155 lbs 156 lbs 156 lbs  BMI 21.63 20.94 70.96  Systolic - 283 662  Diastolic - 66 60  Pulse - 66 61    Providers/Specialists:   NOTE: Primary physician provider listed below. Patient may have been seen by APP or partner within same practice.   PROVIDER ROLE / SPECIALTY LAST OV  Schnier, Dolores Lory, MD Vascular Surgery  06/13/2020  Juluis Pitch, MD Primary Care Provider  04/04/2020  Ida Rogue, MD Cardiology  06/10/2020  Delight Hoh, MD Oncology  10/30/2018   Allergies:  Patient has no known allergies.  Current Home Medications:   No current facility-administered medications for this encounter.   Marland Kitchen acetaminophen (TYLENOL) 500 MG tablet  . amLODipine (NORVASC) 10 MG tablet  . clopidogrel (PLAVIX) 75 MG tablet  . cyanocobalamin (,VITAMIN B-12,) 1000 MCG/ML injection  . dorzolamide-timolol (COSOPT) 22.3-6.8 MG/ML ophthalmic solution  . doxazosin (CARDURA) 2 MG tablet  . HYDROcodone-acetaminophen (NORCO/VICODIN) 5-325 MG tablet  . metoprolol tartrate (LOPRESSOR) 100 MG tablet  . pantoprazole (PROTONIX) 40 MG tablet  . potassium chloride SA (KLOR-CON) 20 MEQ tablet  . rosuvastatin (CRESTOR) 5 MG tablet  . telmisartan (MICARDIS) 80 MG tablet  . Travoprost, BAK Free, (TRAVATAN) 0.004 % SOLN ophthalmic solution   History:   Past Medical History:  Diagnosis Date  . Alcohol abuse   . Aortic atherosclerosis  ([place])   . B12 deficiency   . Cirrhosis ([place])   . COPD (chronic obstructive pulmonary disease) ([place])   . Coronary artery disease   . GERD (gastroesophageal reflux disease)   . Glaucoma   . Grade II diastolic dysfunction   . History of kidney stones   . HLD (hyperlipidemia)   . Hx of radiation therapy   . Hypertension   . Lung mass   . Moderate mitral regurgitation   . Pneumonia   . PVD (peripheral vascular disease) ([place])   . Squamous cell carcinoma of lung, right ([place]) 2019   Past Surgical History:  Procedure Laterality Date  . COLONOSCOPY WITH PROPOFOL    . COLONOSCOPY WITH PROPOFOL N/A 11/25/2018   Procedure: COLONOSCOPY WITH PROPOFOL;  Surgeon: Lollie Sails, MD;  Location: [facility] ENDOSCOPY;  Service: Endoscopy;  Laterality: N/A;  . ESOPHAGOGASTRODUODENOSCOPY (EGD) WITH PROPOFOL N/A 11/25/2018   Procedure: ESOPHAGOGASTRODUODENOSCOPY (EGD) WITH PROPOFOL;  Surgeon: Lollie Sails, MD;  Location: [facility] ENDOSCOPY;  Service: Endoscopy;  Laterality: N/A;  . LOWER EXTREMITY ANGIOGRAPHY Left 06/07/2020   Procedure: LOWER EXTREMITY ANGIOGRAPHY;  Surgeon: Katha Cabal, MD;  Location: [facility];  Service: Cardiovascular;  Laterality: Left;   Family History  Problem Relation Age of Onset  . Stroke Brother   . Diabetes Mellitus II Brother   . Diabetes Mellitus II Mother   . Diabetes Mellitus II Maternal Uncle    Social History   Tobacco Use  . Smoking status: Current Every Day Smoker    Packs/day: 1.00    Years: 50.00    Pack years: 50.00  Types: Cigarettes  . Smokeless tobacco: Never Used  Vaping Use  . Vaping Use: Never used  Substance Use Topics  . Alcohol use: Yes    Comment: "1 pint of liquor per day"  . Drug use: Never    Pertinent Clinical Results:  LABS: Labs reviewed: Acceptable for surgery.  Hospital Outpatient Visit on 07/11/2020  Component Date Value Ref Range Status  . SARS Coronavirus 2 07/11/2020 NEGATIVE  NEGATIVE Final   Comment:  (NOTE) SARS-CoV-2 target nucleic acids are NOT DETECTED.  The SARS-CoV-2 RNA is generally detectable in upper and lower respiratory specimens during the acute phase of infection. Negative results do not preclude SARS-CoV-2 infection, do not rule out co-infections with other pathogens, and should not be used as the sole basis for treatment or other patient management decisions. Negative results must be combined with clinical observations, patient history, and epidemiological information. The expected result is Negative.  Fact Sheet for Patients: SugarRoll.be  Fact Sheet for Healthcare Providers: https://www.woods-mathews.com/  This test is not yet approved or cleared by the [place] FDA and  has been authorized for detection and/or diagnosis of SARS-CoV-2 by FDA under an Emergency Use Authorization (EUA). This EUA will remain  in effect (meaning this test can be used) for the duration of the COVID-19 declaration under Se                          ction 564(b)(1) of the Act, 21 U.S.C. section 360bbb-3(b)(1), unless the authorization is terminated or revoked sooner.  Performed at [facility], [place] [address] [place], [place] [address]   . WBC 07/11/2020 5.9  4.0 - 10.5 K/uL Final  . RBC 07/11/2020 3.67* 4.22 - 5.81 MIL/uL Final  . Hemoglobin 07/11/2020 10.4* 13.0 - 17.0 g/dL Final  . HCT 07/11/2020 33.6* 39.0 - 52.0 % Final  . MCV 07/11/2020 91.6  80.0 - 100.0 fL Final  . MCH 07/11/2020 28.3  26.0 - 34.0 pg Final  . MCHC 07/11/2020 31.0  30.0 - 36.0 g/dL Final  . RDW 07/11/2020 15.3  11.5 - 15.5 % Final  . Platelets 07/11/2020 137* 150 - 400 K/uL Final  . nRBC 07/11/2020 0.0  0.0 - 0.2 % Final  . Neutrophils Relative % 07/11/2020 53  % Final  . Neutro Abs 07/11/2020 3.1  1.7 - 7.7 K/uL Final  . Lymphocytes Relative 07/11/2020 30  % Final  . Lymphs Abs 07/11/2020 1.7  0.7 - 4.0 K/uL Final  . Monocytes Relative 07/11/2020 10  %  Final  . Monocytes Absolute 07/11/2020 0.6  0.1 - 1.0 K/uL Final  . Eosinophils Relative 07/11/2020 5  % Final  . Eosinophils Absolute 07/11/2020 0.3  0.0 - 0.5 K/uL Final  . Basophils Relative 07/11/2020 2  % Final  . Basophils Absolute 07/11/2020 0.1  0.0 - 0.1 K/uL Final  . Immature Granulocytes 07/11/2020 0  % Final  . Abs Immature Granulocytes 07/11/2020 0.01  0.00 - 0.07 K/uL Final   Performed at [facility], [address], [place], [place] [address]  . Sodium 07/11/2020 140  135 - 145 mmol/L Final  . Potassium 07/11/2020 4.0  3.5 - 5.1 mmol/L Final  . Chloride 07/11/2020 110  98 - 111 mmol/L Final  . CO2 07/11/2020 23  22 - 32 mmol/L Final  . Glucose, Bld 07/11/2020 78  70 - 99 mg/dL Final   Glucose reference range applies only to samples taken after fasting for  at least 8 hours.  . BUN 07/11/2020 26* 8 - 23 mg/dL Final  . Creatinine, Ser 07/11/2020 1.28* 0.61 - 1.24 mg/dL Final  . Calcium 07/11/2020 9.0  8.9 - 10.3 mg/dL Final  . GFR, Estimated 07/11/2020 59* >60 mL/min Final   Comment: (NOTE) Calculated using the CKD-EPI Creatinine Equation (2021)   . Anion gap 07/11/2020 7  5 - 15 Final   Performed at [facility], [address] [place], [place] [address]  . Prothrombin Time 07/11/2020 13.6  11.4 - 15.2 seconds Final  . INR 07/11/2020 1.1  0.8 - 1.2 Final   Comment: (NOTE) INR goal varies based on device and disease states. Performed at [facility], [address], [place], [place] [address]   . aPTT 07/11/2020 36  24 - 36 seconds Final   Performed at [facility], [address] [place], [place] [address]  . ABO/RH(D) 07/11/2020 O POS   Final  . Antibody Screen 07/11/2020 NEG   Final  . Sample Expiration 07/11/2020 07/25/2020,2359   Final  . Extend sample reason 07/11/2020    Final                   Value:NO TRANSFUSIONS OR PREGNANCY IN THE PAST 3 MONTHS Performed at [facility], [address]  [place], [place] [address]     ECG:  Date: 06/10/2020 Rate: 61 bpm Rhythm:  Sinus rhythm with PACs Axis (leads I and aVF): Normal Normal (up/up).Marland KitchenMarland KitchenLEFT axis (up/down)...RIGHT axis (down/up).Marland KitchenMarland KitchenEXTREME axis (down/down) Intervals: PR 176 ms. QRS 72 ms. QTc 392 ms. ST segment and T wave changes: No evidence of acute ST segment elevation or depression Comparison: Similar to previous tracing obtained on 02/24/2020; PACs now present   IMAGING / PROCEDURES: LEXISCAN performed on 06/22/2020 1. Study is normal 2. This is a low risk study 3. Left ventricular ejection fraction is normal 68% 4. There is no evidence of stress-induced myocardial ischemia or arrhythmia  VASCULAR ULTRASOUND ABI WITH/WITHOUT TBI performed on 06/13/2020 1. Right:   Resting right ankle-brachial index indicates mild right lower extremity arterial disease.   The right toe-brachial index is normal.  2. Left:   Resting left ankle-brachial index indicates moderate left lower extremity arterial disease.   The left toe-brachial index is abnormal.  ECHOCARDIOGRAM performed on 03/19/2019 1. Left ventricular ejection fraction, by visual estimation, is 60 to 65%.  2. The left ventricle has normal function.  3. There is mildly increased left ventricular hypertrophy.  4. Left ventricular diastolic parameters are consistent with Grade II diastolic dysfunction (pseudonormalization).  5. The left ventricle has no regional wall motion abnormalities.  6. Global right ventricle has normal systolic function. 7. The right ventricular size is normal.  8. No increase in right ventricular wall thickness.  9. Left atrial size was moderately dilated.  10. Moderate mitral valve regurgitation.  11. Tricuspid valve regurgitation is mild.  12. Mild to moderate aortic valve sclerosis/calcification without any evidence of aortic stenosis.  13. Mild to Moderately elevated pulmonary artery systolic pressure.  Impression and Plan:  Samuel Carney has been referred for pre-anesthesia review and clearance prior to him undergoing the planned anesthetic and procedural courses. Available labs, pertinent testing, and imaging results were personally reviewed by me. This patient has been appropriately cleared by cardiology with an overall ACCEPTABLE risk of significant perioperative cardiovascular complications.  Based on clinical review performed today (07/12/20), barring any significant acute changes in the patient's overall condition, it is  anticipated that he will be able to proceed with the planned surgical intervention. Any acute changes in clinical condition may necessitate his procedure being postponed and/or cancelled. Pre-surgical instructions were reviewed with the patient during his PAT appointment and questions were fielded by PAT clinical staff.  [name], [name], [name], [name], [name] [facility]  Peri-operative Services Nurse Practitioner Phone: [phone] 07/12/20 2:40 PM  NOTE: This note has been prepared using Dragon dictation software. Despite my best ability to proofread, there is always the potential that unintentional transcriptional errors may still occur from this process.

## 2020-07-11 NOTE — Patient Instructions (Addendum)
Your procedure is scheduled on:  Wednesday, April 13 Report to the Registration Desk on the 1st floor of the [facility]. To find out your arrival time, please call [phone] between 1PM - 3PM on: Tuesday, April 12  REMEMBER: Instructions that are not followed completely may result in serious medical risk, up to and including death; or upon the discretion of your surgeon and anesthesiologist your surgery may need to be rescheduled.  Do not eat food after midnight the night before surgery.  No gum chewing, lozengers or hard candies.  You may however, drink CLEAR liquids up to 2 hours before you are scheduled to arrive for your surgery. Do not drink anything within 2 hours of your scheduled arrival time.  Clear liquids include: - water  - apple juice without pulp - gatorade (not RED, PURPLE, OR BLUE) - black coffee or tea (Do NOT add milk or creamers to the coffee or tea) Do NOT drink anything that is not on this list.  TAKE THESE MEDICATIONS THE MORNING OF SURGERY WITH A SIP OF WATER:  1.  Amlodipine 2.  Doxazosin 3.  Hydrocodone if needed for pain 4.  Metoprolol 5.  Pantoprazole - (take one the night before and one on the morning of surgery - helps to prevent nausea after surgery.)  Follow recommendations from Cardiologist, Pulmonologist or PCP regarding stopping Plavix.  One week prior to surgery: Stop Anti-inflammatories (NSAIDS) such as Advil, Aleve, Ibuprofen, Motrin, Naproxen, Naprosyn and Aspirin based products such as Excedrin, Goodys Powder, BC Powder. Stop ANY OVER THE COUNTER supplements until after surgery.  No Alcohol for 24 hours before or after surgery.  No Smoking including e-cigarettes for 24 hours prior to surgery.  No chewable tobacco products for at least 6 hours prior to surgery.  No nicotine patches on the day of surgery.  Do not use any "recreational" drugs for at least a week prior to your surgery.  Please be advised that the combination of cocaine  and anesthesia may have negative outcomes, up to and including death. If you test positive for cocaine, your surgery will be cancelled.  On the morning of surgery brush your teeth with toothpaste and water, you may rinse your mouth with mouthwash if you wish. Do not swallow any toothpaste or mouthwash.  Do not wear jewelry, make-up, hairpins, clips or nail polish.  Do not wear lotions, powders, or perfumes.   Do not shave body from the neck down 48 hours prior to surgery just in case you cut yourself which could leave a site for infection.  Also, freshly shaved skin may become irritated if using the CHG soap.  Contact lenses, hearing aids and dentures may not be worn into surgery.  Do not bring valuables to the hospital. [facility] is not responsible for any missing/lost belongings or valuables.   Shower using antibacterial soap the day of surgery.  Notify your doctor if there is any change in your medical condition (cold, fever, infection).  Wear comfortable clothing (specific to your surgery type) to the hospital.  Plan for stool softeners for home use; pain medications have a tendency to cause constipation. You can also help prevent constipation by eating foods high in fiber such as fruits and vegetables and drinking plenty of fluids as your diet allows.  After surgery, you can help prevent lung complications by doing breathing exercises.  Take deep breaths and cough every 1-2 hours. Your doctor may order a device called an Incentive Spirometer to help you  take deep breaths.  If you are being admitted to the hospital overnight, leave your suitcase in the car. After surgery it may be brought to your room.  If you are being discharged the day of surgery, you will not be allowed to drive home. You will need a responsible adult (18 years or older) to drive you home and stay with you that night.   If you are taking public transportation, you will need to have a responsible adult (18  years or older) with you. Please confirm with your physician that it is acceptable to use public transportation.   Please call the [facility] Dept. at [phone] if you have any questions about these instructions.  Surgery Visitation Policy:  Patients undergoing a surgery or procedure may have one family member or support person with them as long as that person is not COVID-19 positive or experiencing its symptoms.  That person may remain in the waiting area during the procedure.  Inpatient Visitation:    Visiting hours are 7 a.m. to 8 p.m. Inpatients will be allowed two visitors daily. The visitors may change each day during the patient's stay. No visitors under the age of 52. Any visitor under the age of 55 must be accompanied by an adult. The visitor must pass COVID-19 screenings, use hand sanitizer when entering and exiting the patient's room and wear a mask at all times, including in the patient's room. Patients must also wear a mask when staff or their visitor are in the room. Masking is required regardless of vaccination status.

## 2020-07-12 NOTE — Telephone Encounter (Signed)
Pre-op request for left femoral endarterectomy, asking to hold Plavix. It looks like procedure is tomorrow, 4/13.  He was recently seen 06/10/20 for pre-op evaluation. For history of CAD (by CT imaging) with stable angina a lexiscan myoview was ordered. This was low risk, normal, EF 68%, no new ischemia.   Dr. Rockey Situ, please specify if it is okay to hold plavix and for how many days. Please route response to P CV DIV PREOP. Thanks!

## 2020-07-12 NOTE — Telephone Encounter (Signed)
Acceptable risk for surgery Recent low risk myoview I think Dr. Ronalee Belts started the plavix following PV procedure,  so I will defer holding that to him Does not need plavix from cardiac perspective

## 2020-07-13 ENCOUNTER — Inpatient Hospital Stay: Payer: Medicare Other | Admitting: Urgent Care

## 2020-07-13 ENCOUNTER — Encounter: Payer: Self-pay | Admitting: Vascular Surgery

## 2020-07-13 ENCOUNTER — Encounter
Admission: RE | Disposition: A | Discharge status: Home / Self Care | Payer: Self-pay | Source: Home / Self Care | Attending: Vascular Surgery

## 2020-07-13 ENCOUNTER — Inpatient Hospital Stay: Payer: Medicare Other

## 2020-07-13 ENCOUNTER — Other Ambulatory Visit: Payer: Self-pay

## 2020-07-13 ENCOUNTER — Inpatient Hospital Stay
Admission: RE | Admit: 2020-07-13 | Discharge: 2020-07-14 | DRG: 254 | Disposition: A | Discharge status: Home / Self Care | Payer: Medicare Other | Attending: Vascular Surgery | Admitting: Vascular Surgery

## 2020-07-13 DIAGNOSIS — Z20822 Contact with and (suspected) exposure to covid-19: Secondary | ICD-10-CM | POA: Diagnosis present

## 2020-07-13 DIAGNOSIS — F17210 Nicotine dependence, cigarettes, uncomplicated: Secondary | ICD-10-CM | POA: Diagnosis present

## 2020-07-13 DIAGNOSIS — I25118 Atherosclerotic heart disease of native coronary artery with other forms of angina pectoris: Secondary | ICD-10-CM | POA: Diagnosis present

## 2020-07-13 DIAGNOSIS — K7460 Unspecified cirrhosis of liver: Secondary | ICD-10-CM | POA: Diagnosis present

## 2020-07-13 DIAGNOSIS — J449 Chronic obstructive pulmonary disease, unspecified: Secondary | ICD-10-CM | POA: Diagnosis present

## 2020-07-13 DIAGNOSIS — K219 Gastro-esophageal reflux disease without esophagitis: Secondary | ICD-10-CM | POA: Diagnosis present

## 2020-07-13 DIAGNOSIS — F1010 Alcohol abuse, uncomplicated: Secondary | ICD-10-CM | POA: Diagnosis present

## 2020-07-13 DIAGNOSIS — I7025 Atherosclerosis of native arteries of other extremities with ulceration: Secondary | ICD-10-CM | POA: Diagnosis not present

## 2020-07-13 DIAGNOSIS — I10 Essential (primary) hypertension: Secondary | ICD-10-CM | POA: Diagnosis present

## 2020-07-13 DIAGNOSIS — E782 Mixed hyperlipidemia: Secondary | ICD-10-CM | POA: Diagnosis present

## 2020-07-13 DIAGNOSIS — L97909 Non-pressure chronic ulcer of unspecified part of unspecified lower leg with unspecified severity: Secondary | ICD-10-CM | POA: Diagnosis present

## 2020-07-13 DIAGNOSIS — H409 Unspecified glaucoma: Secondary | ICD-10-CM | POA: Diagnosis present

## 2020-07-13 DIAGNOSIS — I70299 Other atherosclerosis of native arteries of extremities, unspecified extremity: Secondary | ICD-10-CM | POA: Diagnosis present

## 2020-07-13 DIAGNOSIS — Z923 Personal history of irradiation: Secondary | ICD-10-CM | POA: Diagnosis not present

## 2020-07-13 DIAGNOSIS — I340 Nonrheumatic mitral (valve) insufficiency: Secondary | ICD-10-CM | POA: Diagnosis present

## 2020-07-13 DIAGNOSIS — Z85118 Personal history of other malignant neoplasm of bronchus and lung: Secondary | ICD-10-CM

## 2020-07-13 DIAGNOSIS — I70262 Atherosclerosis of native arteries of extremities with gangrene, left leg: Secondary | ICD-10-CM | POA: Diagnosis present

## 2020-07-13 HISTORY — DX: Hyperlipidemia, unspecified: E78.5

## 2020-07-13 HISTORY — DX: Other ill-defined heart diseases: I51.89

## 2020-07-13 HISTORY — DX: Alcohol abuse, uncomplicated: F10.10

## 2020-07-13 HISTORY — DX: Nonrheumatic mitral (valve) insufficiency: I34.0

## 2020-07-13 HISTORY — DX: Atherosclerosis of aorta: I70.0

## 2020-07-13 HISTORY — DX: Peripheral vascular disease, unspecified: I73.9

## 2020-07-13 HISTORY — PX: ENDARTERECTOMY FEMORAL: SHX5804

## 2020-07-13 LAB — ABO/RH: ABO/RH(D): O POS

## 2020-07-13 LAB — MRSA PCR SCREENING: MRSA by PCR: NEGATIVE

## 2020-07-13 LAB — CBC
HCT: 28.6 % — ABNORMAL LOW (ref 39.0–52.0)
Hemoglobin: 8.9 g/dL — ABNORMAL LOW (ref 13.0–17.0)
MCH: 28.5 pg (ref 26.0–34.0)
MCHC: 31.1 g/dL (ref 30.0–36.0)
MCV: 91.7 fL (ref 80.0–100.0)
Platelets: 100 10*3/uL — ABNORMAL LOW (ref 150–400)
RBC: 3.12 MIL/uL — ABNORMAL LOW (ref 4.22–5.81)
RDW: 15.4 % (ref 11.5–15.5)
WBC: 6.4 10*3/uL (ref 4.0–10.5)
nRBC: 0 % (ref 0.0–0.2)

## 2020-07-13 LAB — CREATININE, SERUM
Creatinine, Ser: 1.15 mg/dL (ref 0.61–1.24)
GFR, Estimated: >60 mL/min (ref >60)

## 2020-07-13 LAB — GLUCOSE, CAPILLARY: Glucose-Capillary: 136 mg/dL — ABNORMAL HIGH (ref 70–99)

## 2020-07-13 SURGERY — ENDARTERECTOMY, FEMORAL
Anesthesia: General | Site: Leg Upper

## 2020-07-13 MED ORDER — METOPROLOL TARTRATE 50 MG PO TABS
100.0000 mg | ORAL_TABLET | Freq: Two times a day (BID) | ORAL | Status: DC
  Administered 2020-07-13: 100 mg via ORAL
  Filled 2020-07-13 (×2): qty 2

## 2020-07-13 MED ORDER — MORPHINE SULFATE (PF) 2 MG/ML IV SOLN: 2.0000 mg | INTRAVENOUS | Status: DC | PRN

## 2020-07-13 MED ORDER — ROSUVASTATIN CALCIUM 10 MG PO TABS
5.0000 mg | ORAL_TABLET | Freq: Every day | ORAL | Status: DC
  Administered 2020-07-13: 5 mg via ORAL
  Filled 2020-07-13 (×2): qty 1

## 2020-07-13 MED ORDER — SORBITOL 70 % SOLN
30.0000 mL | Freq: Every day | Status: DC | PRN
  Filled 2020-07-13: qty 30

## 2020-07-13 MED ORDER — KETAMINE HCL 10 MG/ML IJ SOLN
INTRAMUSCULAR | Status: DC | PRN
  Administered 2020-07-13: 10 mg via INTRAVENOUS
  Administered 2020-07-13: 30 mg via INTRAVENOUS

## 2020-07-13 MED ORDER — BUPIVACAINE HCL (PF) 0.5 % IJ SOLN
INTRAMUSCULAR | Status: AC
  Filled 2020-07-13: qty 30

## 2020-07-13 MED ORDER — PHENYLEPHRINE HCL (PRESSORS) 10 MG/ML IV SOLN
INTRAVENOUS | Status: DC | PRN
  Administered 2020-07-13 (×2): 100 ug via INTRAVENOUS
  Administered 2020-07-13: 200 ug via INTRAVENOUS
  Administered 2020-07-13: 100 ug via INTRAVENOUS
  Administered 2020-07-13: 200 ug via INTRAVENOUS
  Administered 2020-07-13 (×3): 100 ug via INTRAVENOUS

## 2020-07-13 MED ORDER — NITROGLYCERIN IN D5W 200-5 MCG/ML-% IV SOLN: 5.0000 ug/min | INTRAVENOUS | Status: DC

## 2020-07-13 MED ORDER — ACETAMINOPHEN 325 MG PO TABS: 325.0000 mg | ORAL_TABLET | ORAL | Status: DC | PRN

## 2020-07-13 MED ORDER — MIDAZOLAM HCL 2 MG/2ML IJ SOLN
INTRAMUSCULAR | Status: DC | PRN
  Administered 2020-07-13: 1 mg via INTRAVENOUS

## 2020-07-13 MED ORDER — ACETAMINOPHEN 500 MG PO TABS: 1000.0000 mg | ORAL_TABLET | Freq: Four times a day (QID) | ORAL | Status: DC | PRN

## 2020-07-13 MED ORDER — CLOPIDOGREL BISULFATE 75 MG PO TABS
75.0000 mg | ORAL_TABLET | Freq: Every day | ORAL | Status: DC
  Administered 2020-07-14: 75 mg via ORAL
  Filled 2020-07-13: qty 1

## 2020-07-13 MED ORDER — ALUM & MAG HYDROXIDE-SIMETH 200-200-20 MG/5ML PO SUSP: 15.0000 mL | ORAL | Status: DC | PRN

## 2020-07-13 MED ORDER — MAGNESIUM SULFATE 2 GM/50ML IV SOLN: 2.0000 g | Freq: Every day | INTRAVENOUS | Status: DC | PRN

## 2020-07-13 MED ORDER — HYDRALAZINE HCL 20 MG/ML IJ SOLN: 5.0000 mg | INTRAMUSCULAR | Status: DC | PRN

## 2020-07-13 MED ORDER — CYANOCOBALAMIN 1000 MCG/ML IJ SOLN: 1000.0000 ug | INTRAMUSCULAR | Status: DC

## 2020-07-13 MED ORDER — DOPAMINE-DEXTROSE 3.2-5 MG/ML-% IV SOLN
3.0000 ug/kg/min | INTRAVENOUS | Status: DC
Start: 2020-07-13 — End: 2020-07-14

## 2020-07-13 MED ORDER — ONDANSETRON HCL 4 MG/2ML IJ SOLN
INTRAMUSCULAR | Status: DC | PRN
  Administered 2020-07-13: 4 mg via INTRAVENOUS

## 2020-07-13 MED ORDER — AMLODIPINE BESYLATE 10 MG PO TABS
10.0000 mg | ORAL_TABLET | Freq: Every day | ORAL | Status: DC
  Filled 2020-07-13: qty 1

## 2020-07-13 MED ORDER — HYDROCODONE-ACETAMINOPHEN 5-325 MG PO TABS: 1.0000 | ORAL_TABLET | Freq: Four times a day (QID) | ORAL | Status: DC | PRN

## 2020-07-13 MED ORDER — SUGAMMADEX SODIUM 200 MG/2ML IV SOLN
INTRAVENOUS | Status: DC | PRN
  Administered 2020-07-13: 200 mg via INTRAVENOUS

## 2020-07-13 MED ORDER — OXYCODONE HCL 5 MG PO TABS: 5.0000 mg | ORAL_TABLET | Freq: Once | ORAL | Status: DC | PRN

## 2020-07-13 MED ORDER — LIDOCAINE HCL (PF) 2 % IJ SOLN
INTRAMUSCULAR | Status: AC
  Filled 2020-07-13: qty 5

## 2020-07-13 MED ORDER — BUPIVACAINE LIPOSOME 1.3 % IJ SUSP
INTRAMUSCULAR | Status: DC | PRN
  Administered 2020-07-13: 20 mL

## 2020-07-13 MED ORDER — ONDANSETRON HCL 4 MG/2ML IJ SOLN: 4.0000 mg | Freq: Four times a day (QID) | INTRAMUSCULAR | Status: DC | PRN

## 2020-07-13 MED ORDER — OXYCODONE-ACETAMINOPHEN 5-325 MG PO TABS
1.0000 | ORAL_TABLET | ORAL | Status: DC | PRN
Start: 2020-07-13 — End: 2020-07-14

## 2020-07-13 MED ORDER — HYDROCODONE-ACETAMINOPHEN 5-325 MG PO TABS
1.0000 | ORAL_TABLET | Freq: Four times a day (QID) | ORAL | Status: DC | PRN
Start: 2020-07-13 — End: 2020-07-14

## 2020-07-13 MED ORDER — CHLORHEXIDINE GLUCONATE CLOTH 2 % EX PADS: 6.0000 | MEDICATED_PAD | Freq: Once | CUTANEOUS | Status: DC

## 2020-07-13 MED ORDER — SODIUM CHLORIDE 0.9 % IV SOLN: INTRAVENOUS | Status: DC

## 2020-07-13 MED ORDER — KETAMINE HCL 50 MG/5ML IJ SOSY
PREFILLED_SYRINGE | INTRAMUSCULAR | Status: AC
  Filled 2020-07-13: qty 5

## 2020-07-13 MED ORDER — ROCURONIUM BROMIDE 10 MG/ML (PF) SYRINGE
PREFILLED_SYRINGE | INTRAVENOUS | Status: AC
  Filled 2020-07-13: qty 10

## 2020-07-13 MED ORDER — DEXMEDETOMIDINE (PRECEDEX) IN NS 20 MCG/5ML (4 MCG/ML) IV SYRINGE
PREFILLED_SYRINGE | INTRAVENOUS | Status: AC
  Filled 2020-07-13: qty 5

## 2020-07-13 MED ORDER — NICOTINE 14 MG/24HR TD PT24
14.0000 mg | MEDICATED_PATCH | Freq: Every day | TRANSDERMAL | Status: DC
  Filled 2020-07-13: qty 1

## 2020-07-13 MED ORDER — DOCUSATE SODIUM 100 MG PO CAPS
100.0000 mg | ORAL_CAPSULE | Freq: Every day | ORAL | Status: DC
  Filled 2020-07-13: qty 1

## 2020-07-13 MED ORDER — ALBUMIN HUMAN 5 % IV SOLN: INTRAVENOUS | Status: DC | PRN

## 2020-07-13 MED ORDER — EPHEDRINE 5 MG/ML INJ
INTRAVENOUS | Status: AC
  Filled 2020-07-13: qty 10

## 2020-07-13 MED ORDER — OXYCODONE HCL 5 MG/5ML PO SOLN
5.0000 mg | Freq: Once | ORAL | Status: DC | PRN
Start: 2020-07-13 — End: 2020-07-13

## 2020-07-13 MED ORDER — FENTANYL CITRATE (PF) 100 MCG/2ML IJ SOLN
INTRAMUSCULAR | Status: AC
  Filled 2020-07-13: qty 2

## 2020-07-13 MED ORDER — SODIUM CHLORIDE 0.9 % IV SOLN
INTRAVENOUS | Status: DC | PRN
  Administered 2020-07-13: 20 ug/min via INTRAVENOUS

## 2020-07-13 MED ORDER — FENTANYL CITRATE (PF) 100 MCG/2ML IJ SOLN
INTRAMUSCULAR | Status: DC | PRN
  Administered 2020-07-13: 100 ug via INTRAVENOUS

## 2020-07-13 MED ORDER — GUAIFENESIN-DM 100-10 MG/5ML PO SYRP: 15.0000 mL | ORAL_SOLUTION | ORAL | Status: DC | PRN

## 2020-07-13 MED ORDER — VASOPRESSIN 20 UNIT/ML IV SOLN
INTRAVENOUS | Status: AC
  Filled 2020-07-13: qty 1

## 2020-07-13 MED ORDER — GLYCOPYRROLATE 0.2 MG/ML IJ SOLN
INTRAMUSCULAR | Status: DC | PRN
  Administered 2020-07-13 (×2): .1 mg via INTRAVENOUS

## 2020-07-13 MED ORDER — DEXAMETHASONE SODIUM PHOSPHATE 10 MG/ML IJ SOLN
INTRAMUSCULAR | Status: AC
  Filled 2020-07-13: qty 1

## 2020-07-13 MED ORDER — DOXAZOSIN MESYLATE 2 MG PO TABS
2.0000 mg | ORAL_TABLET | Freq: Every day | ORAL | Status: DC
  Filled 2020-07-13: qty 1

## 2020-07-13 MED ORDER — BUPIVACAINE HCL (PF) 0.5 % IJ SOLN
INTRAMUSCULAR | Status: DC | PRN
  Administered 2020-07-13: 30 mL

## 2020-07-13 MED ORDER — FENTANYL CITRATE (PF) 100 MCG/2ML IJ SOLN
25.0000 ug | INTRAMUSCULAR | Status: DC | PRN
  Administered 2020-07-13 (×2): 25 ug via INTRAVENOUS

## 2020-07-13 MED ORDER — LATANOPROST 0.005 % OP SOLN
1.0000 [drp] | Freq: Every day | OPHTHALMIC | Status: DC
  Administered 2020-07-13: 1 [drp] via OPHTHALMIC
  Filled 2020-07-13: qty 2.5

## 2020-07-13 MED ORDER — PHENOL 1.4 % MT LIQD
1.0000 | OROMUCOSAL | Status: DC | PRN
  Filled 2020-07-13: qty 177

## 2020-07-13 MED ORDER — PHENYLEPHRINE HCL (PRESSORS) 10 MG/ML IV SOLN
INTRAVENOUS | Status: AC
  Filled 2020-07-13: qty 1

## 2020-07-13 MED ORDER — CHLORHEXIDINE GLUCONATE 0.12 % MT SOLN
15.0000 mL | Freq: Once | OROMUCOSAL | Status: AC
  Administered 2020-07-13: 15 mL via OROMUCOSAL

## 2020-07-13 MED ORDER — ACETAMINOPHEN 650 MG RE SUPP: 325.0000 mg | RECTAL | Status: DC | PRN

## 2020-07-13 MED ORDER — ENOXAPARIN SODIUM 40 MG/0.4ML ~~LOC~~ SOLN
40.0000 mg | SUBCUTANEOUS | Status: DC
  Filled 2020-07-13: qty 0.4

## 2020-07-13 MED ORDER — MIDAZOLAM HCL 2 MG/2ML IJ SOLN
INTRAMUSCULAR | Status: AC
  Filled 2020-07-13: qty 2

## 2020-07-13 MED ORDER — SODIUM CHLORIDE 0.9 % IV SOLN: 500.0000 mL | Freq: Once | INTRAVENOUS | Status: DC | PRN

## 2020-07-13 MED ORDER — DORZOLAMIDE HCL-TIMOLOL MAL 2-0.5 % OP SOLN
1.0000 [drp] | Freq: Two times a day (BID) | OPHTHALMIC | Status: DC
  Administered 2020-07-13: 1 [drp] via OPHTHALMIC
  Filled 2020-07-13: qty 10

## 2020-07-13 MED ORDER — CEFAZOLIN SODIUM-DEXTROSE 2-4 GM/100ML-% IV SOLN
2.0000 g | Freq: Three times a day (TID) | INTRAVENOUS | Status: AC
  Administered 2020-07-13 (×2): 2 g via INTRAVENOUS
  Filled 2020-07-13 (×2): qty 100

## 2020-07-13 MED ORDER — ORAL CARE MOUTH RINSE: 15.0000 mL | Freq: Once | OROMUCOSAL | Status: AC

## 2020-07-13 MED ORDER — CEFAZOLIN SODIUM-DEXTROSE 2-4 GM/100ML-% IV SOLN
INTRAVENOUS | Status: AC
  Filled 2020-07-13: qty 100

## 2020-07-13 MED ORDER — PROPOFOL 10 MG/ML IV BOLUS
INTRAVENOUS | Status: DC | PRN
  Administered 2020-07-13: 150 mg via INTRAVENOUS

## 2020-07-13 MED ORDER — DEXAMETHASONE SODIUM PHOSPHATE 10 MG/ML IJ SOLN
INTRAMUSCULAR | Status: DC | PRN
  Administered 2020-07-13: 10 mg via INTRAVENOUS

## 2020-07-13 MED ORDER — EPHEDRINE SULFATE 50 MG/ML IJ SOLN
INTRAMUSCULAR | Status: DC | PRN
  Administered 2020-07-13: 15 mg via INTRAVENOUS
  Administered 2020-07-13 (×5): 10 mg via INTRAVENOUS
  Administered 2020-07-13 (×2): 5 mg via INTRAVENOUS
  Administered 2020-07-13: 10 mg via INTRAVENOUS
  Administered 2020-07-13: 5 mg via INTRAVENOUS
  Administered 2020-07-13: 10 mg via INTRAVENOUS

## 2020-07-13 MED ORDER — SENNOSIDES-DOCUSATE SODIUM 8.6-50 MG PO TABS: 1.0000 | ORAL_TABLET | Freq: Every evening | ORAL | Status: DC | PRN

## 2020-07-13 MED ORDER — POTASSIUM CHLORIDE CRYS ER 20 MEQ PO TBCR: 20.0000 meq | EXTENDED_RELEASE_TABLET | Freq: Every day | ORAL | Status: DC | PRN

## 2020-07-13 MED ORDER — LACTATED RINGERS IV SOLN: INTRAVENOUS | Status: DC

## 2020-07-13 MED ORDER — HEPARIN SODIUM (PORCINE) 5000 UNIT/ML IJ SOLN
INTRAMUSCULAR | Status: AC
  Filled 2020-07-13: qty 1

## 2020-07-13 MED ORDER — BUPIVACAINE LIPOSOME 1.3 % IJ SUSP
INTRAMUSCULAR | Status: AC
  Filled 2020-07-13: qty 20

## 2020-07-13 MED ORDER — EPHEDRINE 5 MG/ML INJ
INTRAVENOUS | Status: AC
  Filled 2020-07-13: qty 20

## 2020-07-13 MED ORDER — ASPIRIN EC 81 MG PO TBEC
81.0000 mg | DELAYED_RELEASE_TABLET | Freq: Every day | ORAL | Status: DC
  Administered 2020-07-14: 81 mg via ORAL
  Filled 2020-07-13: qty 1

## 2020-07-13 MED ORDER — IRBESARTAN 150 MG PO TABS: 75.0000 mg | ORAL_TABLET | Freq: Every day | ORAL | Status: DC

## 2020-07-13 MED ORDER — LABETALOL HCL 5 MG/ML IV SOLN: 10.0000 mg | INTRAVENOUS | Status: DC | PRN

## 2020-07-13 MED ORDER — SODIUM CHLORIDE 0.9 % IV SOLN
INTRAVENOUS | Status: DC | PRN
  Administered 2020-07-13: 501 mL via INTRAMUSCULAR

## 2020-07-13 MED ORDER — ROCURONIUM BROMIDE 100 MG/10ML IV SOLN
INTRAVENOUS | Status: DC | PRN
  Administered 2020-07-13: 50 mg via INTRAVENOUS
  Administered 2020-07-13 (×3): 10 mg via INTRAVENOUS
  Administered 2020-07-13: 20 mg via INTRAVENOUS
  Administered 2020-07-13: 50 mg via INTRAVENOUS
  Administered 2020-07-13: 10 mg via INTRAVENOUS

## 2020-07-13 MED ORDER — ALBUMIN HUMAN 5 % IV SOLN
INTRAVENOUS | Status: AC
  Filled 2020-07-13: qty 250

## 2020-07-13 MED ORDER — FENTANYL CITRATE (PF) 100 MCG/2ML IJ SOLN
INTRAMUSCULAR | Status: AC
  Administered 2020-07-13: 50 ug via INTRAVENOUS
  Filled 2020-07-13: qty 2

## 2020-07-13 MED ORDER — CHLORHEXIDINE GLUCONATE 0.12 % MT SOLN
OROMUCOSAL | Status: AC
  Filled 2020-07-13: qty 15

## 2020-07-13 MED ORDER — HEPARIN SODIUM (PORCINE) 1000 UNIT/ML IJ SOLN
INTRAMUSCULAR | Status: AC
  Filled 2020-07-13: qty 1

## 2020-07-13 MED ORDER — SUCCINYLCHOLINE CHLORIDE 200 MG/10ML IV SOSY
PREFILLED_SYRINGE | INTRAVENOUS | Status: AC
  Filled 2020-07-13: qty 10

## 2020-07-13 MED ORDER — GLYCOPYRROLATE 0.2 MG/ML IJ SOLN
INTRAMUSCULAR | Status: AC
  Filled 2020-07-13: qty 1

## 2020-07-13 MED ORDER — FAMOTIDINE IN NACL 20-0.9 MG/50ML-% IV SOLN
20.0000 mg | Freq: Two times a day (BID) | INTRAVENOUS | Status: DC
  Administered 2020-07-13 (×2): 20 mg via INTRAVENOUS
  Filled 2020-07-13: qty 50

## 2020-07-13 MED ORDER — CHLORHEXIDINE GLUCONATE CLOTH 2 % EX PADS
6.0000 | MEDICATED_PAD | Freq: Every day | CUTANEOUS | Status: DC
  Administered 2020-07-13: 6 via TOPICAL

## 2020-07-13 MED ORDER — VISTASEAL 10 ML SINGLE DOSE KIT
PACK | CUTANEOUS | Status: AC
  Filled 2020-07-13: qty 10

## 2020-07-13 MED ORDER — CEFAZOLIN SODIUM-DEXTROSE 2-4 GM/100ML-% IV SOLN
2.0000 g | INTRAVENOUS | Status: AC
  Administered 2020-07-13: 2 g via INTRAVENOUS

## 2020-07-13 MED ORDER — POTASSIUM CHLORIDE CRYS ER 20 MEQ PO TBCR
20.0000 meq | EXTENDED_RELEASE_TABLET | Freq: Every day | ORAL | Status: DC
  Administered 2020-07-13: 20 meq via ORAL
  Filled 2020-07-13 (×2): qty 1

## 2020-07-13 MED ORDER — LIDOCAINE HCL (CARDIAC) PF 100 MG/5ML IV SOSY
PREFILLED_SYRINGE | INTRAVENOUS | Status: DC | PRN
  Administered 2020-07-13: 80 mg via INTRAVENOUS

## 2020-07-13 MED ORDER — PROPOFOL 10 MG/ML IV BOLUS
INTRAVENOUS | Status: AC
  Filled 2020-07-13: qty 20

## 2020-07-13 MED ORDER — ONDANSETRON HCL 4 MG/2ML IJ SOLN
INTRAMUSCULAR | Status: AC
  Filled 2020-07-13: qty 2

## 2020-07-13 MED ORDER — PANTOPRAZOLE SODIUM 40 MG PO TBEC
40.0000 mg | DELAYED_RELEASE_TABLET | Freq: Every day | ORAL | Status: DC
  Filled 2020-07-13: qty 1

## 2020-07-13 MED ORDER — DEXMEDETOMIDINE (PRECEDEX) IN NS 20 MCG/5ML (4 MCG/ML) IV SYRINGE
PREFILLED_SYRINGE | INTRAVENOUS | Status: DC | PRN
  Administered 2020-07-13: 8 ug via INTRAVENOUS

## 2020-07-13 MED ORDER — ONDANSETRON HCL 4 MG/2ML IJ SOLN: 4.0000 mg | Freq: Once | INTRAMUSCULAR | Status: DC | PRN

## 2020-07-13 MED ORDER — VISTASEAL 10 ML SINGLE DOSE KIT
PACK | CUTANEOUS | Status: DC | PRN
  Administered 2020-07-13: 10 mL via TOPICAL

## 2020-07-13 MED ORDER — CLOPIDOGREL BISULFATE 75 MG PO TABS
75.0000 mg | ORAL_TABLET | Freq: Every day | ORAL | Status: DC
Start: 2020-07-13 — End: 2020-07-13

## 2020-07-13 MED ORDER — HEPARIN SODIUM (PORCINE) 1000 UNIT/ML IJ SOLN
INTRAMUSCULAR | Status: DC | PRN
  Administered 2020-07-13 (×2): 2000 [IU] via INTRAVENOUS
  Administered 2020-07-13: 5000 [IU] via INTRAVENOUS

## 2020-07-13 MED ORDER — METOPROLOL TARTRATE 5 MG/5ML IV SOLN: 2.0000 mg | INTRAVENOUS | Status: DC | PRN

## 2020-07-13 SURGICAL SUPPLY — 102 items
APPLIER CLIP 11 MED OPEN (CLIP)
APPLIER CLIP 9.375 SM OPEN (CLIP)
BAG DECANTER FOR FLEXI CONT (MISCELLANEOUS) ×3 IMPLANT
BALLN LUTONIX 018 4X100X130 (BALLOONS) ×3
BALLN LUTONIX 018 4X150X130 (BALLOONS) ×3
BALLN LUTONIX 018 4X300X130 (BALLOONS) ×3
BALLN LUTONIX 018 5X150X130 (BALLOONS) ×3
BALLN LUTONIX 018 5X220X130 (BALLOONS) ×3
BALLN LUTONIX 018 6X150X130 (BALLOONS) ×6
BALLN ULTRVRSE 2.5X300X150 (BALLOONS) ×3
BALLN ULTRVRSE 3X80X150 (BALLOONS) ×1
BALLN ULTRVRSE 3X80X150 OTW (BALLOONS) ×2
BALLOON LUTONIX 018 4X100X130 (BALLOONS) ×2 IMPLANT
BALLOON LUTONIX 018 4X150X130 (BALLOONS) ×2 IMPLANT
BALLOON LUTONIX 018 4X300X130 (BALLOONS) ×2 IMPLANT
BALLOON LUTONIX 018 5X150X130 (BALLOONS) ×2 IMPLANT
BALLOON LUTONIX 018 5X220X130 (BALLOONS) ×2 IMPLANT
BALLOON LUTONIX 018 6X150X130 (BALLOONS) ×4 IMPLANT
BALLOON ULTRVRSE 2.5X300X150 (BALLOONS) ×2 IMPLANT
BALLOON ULTRVRSE 3X80X150 OTW (BALLOONS) ×2 IMPLANT
BLADE SURG 15 STRL LF DISP TIS (BLADE) ×2 IMPLANT
BLADE SURG 15 STRL SS (BLADE) ×1
BLADE SURG SZ11 CARB STEEL (BLADE) ×3 IMPLANT
BOOT SUTURE AID YELLOW STND (SUTURE) ×3 IMPLANT
BRUSH SCRUB EZ  4% CHG (MISCELLANEOUS) ×1
BRUSH SCRUB EZ 4% CHG (MISCELLANEOUS) ×2 IMPLANT
CANISTER SUCT 1200ML W/VALVE (MISCELLANEOUS) ×3 IMPLANT
CATH BEACON 5 .035 40 KMP TP (CATHETERS) ×2 IMPLANT
CATH BEACON 5 .038 40 KMP TP (CATHETERS) ×1
CATH KUMPE SOFT-VU 5FR 65 (CATHETERS) ×3 IMPLANT
CATH NAVICROSS ANGLED 90CM (MICROCATHETER) ×3 IMPLANT
CHLORAPREP W/TINT 26 (MISCELLANEOUS) ×3 IMPLANT
CLIP APPLIE 11 MED OPEN (CLIP) IMPLANT
CLIP APPLIE 9.375 SM OPEN (CLIP) IMPLANT
CNTNR SPEC 2.5X3XGRAD LEK (MISCELLANEOUS) ×2
CONT SPEC 4OZ STER OR WHT (MISCELLANEOUS) ×1
CONTAINER SPEC 2.5X3XGRAD LEK (MISCELLANEOUS) ×2 IMPLANT
COVER WAND RF STERILE (DRAPES) ×3 IMPLANT
DECANTER SPIKE VIAL GLASS SM (MISCELLANEOUS) ×3 IMPLANT
DERMABOND ADVANCED (GAUZE/BANDAGES/DRESSINGS) ×1
DERMABOND ADVANCED .7 DNX12 (GAUZE/BANDAGES/DRESSINGS) ×2 IMPLANT
DEVICE TORQUE (MISCELLANEOUS) ×3 IMPLANT
DRAPE INCISE IOBAN 66X45 STRL (DRAPES) ×3 IMPLANT
DRESSING SURGICEL FIBRLLR 1X2 (HEMOSTASIS) ×2 IMPLANT
DRSG OPSITE POSTOP 4X8 (GAUZE/BANDAGES/DRESSINGS) ×3 IMPLANT
DRSG SURGICEL FIBRILLAR 1X2 (HEMOSTASIS) ×3
ELECT CAUTERY BLADE 6.4 (BLADE) ×3 IMPLANT
ELECT REM PT RETURN 9FT ADLT (ELECTROSURGICAL) ×3
ELECTRODE REM PT RTRN 9FT ADLT (ELECTROSURGICAL) ×2 IMPLANT
GAUZE 4X4 16PLY RFD (DISPOSABLE) ×6 IMPLANT
GLIDEWIRE ADV .035X180CM (WIRE) ×3 IMPLANT
GLOVE SURG ENC MOIS LTX SZ7 (GLOVE) ×6 IMPLANT
GLOVE SURG SYN 8.0 (GLOVE) ×3 IMPLANT
GLOVE SURG UNDER LTX SZ7.5 (GLOVE) ×3 IMPLANT
GOWN STRL REUS W/ TWL LRG LVL3 (GOWN DISPOSABLE) ×4 IMPLANT
GOWN STRL REUS W/ TWL XL LVL3 (GOWN DISPOSABLE) ×6 IMPLANT
GOWN STRL REUS W/TWL LRG LVL3 (GOWN DISPOSABLE) ×2
GOWN STRL REUS W/TWL XL LVL3 (GOWN DISPOSABLE) ×3
IV NS 500ML (IV SOLUTION) ×1
IV NS 500ML BAXH (IV SOLUTION) ×2 IMPLANT
KIT ENCORE 26 ADVANTAGE (KITS) ×3 IMPLANT
KIT TURNOVER KIT A (KITS) ×3 IMPLANT
LABEL OR SOLS (LABEL) ×3 IMPLANT
LOOP RED MAXI  1X406MM (MISCELLANEOUS) ×3
LOOP VESSEL MAXI 1X406 RED (MISCELLANEOUS) ×6 IMPLANT
LOOP VESSEL MINI 0.8X406 BLUE (MISCELLANEOUS) ×6 IMPLANT
LOOPS BLUE MINI 0.8X406MM (MISCELLANEOUS) ×3
MANIFOLD NEPTUNE II (INSTRUMENTS) ×3 IMPLANT
NEEDLE HYPO 18GX1.5 BLUNT FILL (NEEDLE) ×3 IMPLANT
NEEDLE HYPO 22GX1.5 SAFETY (NEEDLE) ×3 IMPLANT
NS IRRIG 500ML POUR BTL (IV SOLUTION) ×3 IMPLANT
PACK ANGIOGRAPHY (CUSTOM PROCEDURE TRAY) ×3 IMPLANT
PACK BASIN MAJOR ARMC (MISCELLANEOUS) ×3 IMPLANT
PACK UNIVERSAL (MISCELLANEOUS) ×3 IMPLANT
PATCH CAROTID ECM VASC 1X10 (Prosthesis & Implant Heart) ×6 IMPLANT
PENCIL ELECTRO HAND CTR (MISCELLANEOUS) IMPLANT
SET WALTER ACTIVATION W/DRAPE (SET/KITS/TRAYS/PACK) ×3 IMPLANT
SHEATH BRITE TIP 7FRX11 (SHEATH) ×3 IMPLANT
STENT LIFESTENT 5F 5X120X135 (Permanent Stent) ×3 IMPLANT
STENT LIFESTENT 5F 6X150X135 (Permanent Stent) ×6 IMPLANT
STENT LIFESTENT 5F 6X60X135 (Permanent Stent) ×3 IMPLANT
STENT VIABAHN 7X150X120 (Permanent Stent) ×3 IMPLANT
SUT MNCRL+ 5-0 UNDYED PC-3 (SUTURE) ×2 IMPLANT
SUT MONOCRYL 5-0 (SUTURE) ×1
SUT PROLENE 5 0 RB 1 DA (SUTURE) ×18 IMPLANT
SUT PROLENE 6 0 BV (SUTURE) ×30 IMPLANT
SUT PROLENE 7 0 BV 1 (SUTURE) ×21 IMPLANT
SUT SILK 2 0 (SUTURE) ×1
SUT SILK 2-0 18XBRD TIE 12 (SUTURE) ×2 IMPLANT
SUT SILK 3 0 (SUTURE) ×1
SUT SILK 3-0 18XBRD TIE 12 (SUTURE) ×2 IMPLANT
SUT SILK 4 0 (SUTURE) ×1
SUT SILK 4-0 18XBRD TIE 12 (SUTURE) ×2 IMPLANT
SUT VIC AB 2-0 CT1 27 (SUTURE) ×2
SUT VIC AB 2-0 CT1 TAPERPNT 27 (SUTURE) ×4 IMPLANT
SUT VIC AB 3-0 SH 27 (SUTURE) ×1
SUT VIC AB 3-0 SH 27X BRD (SUTURE) ×2 IMPLANT
SUT VICRYL+ 3-0 36IN CT-1 (SUTURE) ×6 IMPLANT
SYR 20ML LL LF (SYRINGE) ×6 IMPLANT
SYR 5ML LL (SYRINGE) ×3 IMPLANT
TRAY FOLEY MTR SLVR 16FR STAT (SET/KITS/TRAYS/PACK) ×3 IMPLANT
WIRE G V18X300CM (WIRE) ×6 IMPLANT

## 2020-07-13 NOTE — Op Note (Signed)
OPERATIVE NOTE   PROCEDURE: 1. Left common femoral and superficial femoral endarterectomy with Cormatrix patch angioplasty. 2. Left profunda femoris endarterectomy separate and distinct with a separate CorMatrix patch. 3. Open transluminal angioplasty and stent placement left SFA. 4. Open transluminal angioplasty and stent placement left posterior tibial 5. Introduction catheter into posterior tibial artery third order catheter placement with imaging as well as administration of intra-arterial nitroglycerin.  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  ASSISTANT(S): None  ANESTHESIA: general  ESTIMATED BLOOD LOSS: 500 cc  FINDING(S): 1. Profound calcific plaque noted of the left common femoral extending past the initial bifurcation of the profunda femoris arteries as well as down the extensive length of the SFA.  Furthermore the superficial femoral artery disease demonstrated multiple greater than 80% lesions throughout the proximal two thirds.  At [facility] canal edema disease progressed to complete occlusion in the popliteal remains occluded throughout its course.  The tibioperoneal trunk was occluded as was the posterior tibial at its origin.  The peroneal and anterior tibial are occluded throughout their entire length.  The distal two thirds of the posterior tibial are patent filling the plantar vessels and the pedal arch.  Also of note the profunda femoris disease extended approximately 7 cm distal to the origin creating a situation where simple eversion endarterectomy was then adequate and therefore treating this required a separate arteriotomy with application of a completely separate second CorMatrix patch angioplasty.  SPECIMEN(S):  Calcific plaque from the common femoral, superficial  femoral and the profunda femoris artery  INDICATIONS:   Samuel Carney 75 y.o. y.o.male who presents with complaints of lifestyle limiting claudication and pain continuously in the left lower extremity associated with gangrenous changes of the left foot. The patient has documented severe atherosclerotic occlusive disease and has undergone minimally invasive treatments in the past. However, at this point his primary area of stricture stenosis resides in the common femoral and origins of the superficial femoral and profunda femoris extending into these arteries and therefore this is not amenable to intervention. The patient is therefore undergoing open endarterectomy. The risks and benefits of surgery have been reviewed with the patient, all questions have answered; alternative therapies have been reviewed as well and the patient has agreed to proceed with surgical open repair.  DESCRIPTION: After obtaining full informed written consent, the patient was brought back to the operating room and placed supine upon the operating table.  The patient received IV antibiotics prior to induction.  After obtaining adequate anesthesia, the patient was prepped and draped in the standard fashion for left femoral exposure.  Co-surgeons are required because this is a complicated procedure with work being performed simultaneously from both the patient's right left sides.  This also expedites the procedure making a shorter operative time reducing complications and improving patient safety.  Attention was turned to the left groin with Dr. Lucky Cowboy working on the patient's right and myself working on the left of the patient.  Vertical  Incision was made over the left common femoral artery and dissection carried down to the common femoral artery with electrocautery.  I dissected out the common femoral artery from the distal external iliac artery (identified by the superficial circumflex vessels) down to the femoral bifurcation.  On  initial inspection, the common femoral artery was: densely calcified and there was no palpable pulse noted.    Subsequently the  dissection was continued to include all circumflex branches and the profunda femoral artery and superficial femoral artery. The superficial femoral artery was dissected circumferentially for a distance of approximately 3-4 cm and the profunda femoris was dissected circumferentially out to the fourth order branches individual vessel loops were placed around each branch.  Control of all branches was obtained with vessel loops.  A softer area in the distal external iliac artery amendable to clamping was identified.    The patient was given 5000 units of Heparin intravenously, which was a therapeutic bolus, a total of 2 additional 2000 unit boluses were also administered throughout the case.  This means a total of 9000 units of heparin was administered during this operation.   After waiting 3 minutes, the distal external iliac artery was clamped and all of the vessel loops were placed under tension.  Arteriotomy was made in the common femoral artery with a 11-blade and extended it with a Potts scissor proximally and distally extending the distal end down the SFA for approximately 3 cm.   Endarterectomy was then performed under direct visualization using a freer elevator and a right angle from the mid common femoral extending up both proximally and distally. Proximally the endarterectomy was brought up to the level of the clamp where a clean edge was obtained. Distally the endarterectomy was carried down to a soft spot in the SFA where a feathered edge was obtained.  7-0 Prolene interrupted tacking sutures were placed to secure the leading edge of the plaque in the SFA.  The profunda femoris was treated with an eversion technique initially as a first step and not the true treatment knowing that the endarterectomy required was going to extend at least 4-5 more centimeters and therefore  entire second arteriotomy and patch angioplasty was necessary.  Providing a plaque free segment of the artery at the origin would then allow for crossclamping at the level of the proximal profunda femoris and thus maintaining flow to the femoral-popliteal reconstruction.     At this point, a corematrix patch was fashioned for the geometry of the arteriotomy.  The patch was sewn to the artery with 2 running stitches of 6-0 Prolene, running from each end.  Prior to completing the patch angioplasty, the profunda femoral artery was flushed as was the superficial femoral artery. The system was then forward flushed. The endarterectomy site was then irrigated copiously with heparinized saline. The patch angioplasty was completed in the usual fashion.  Flow was then reestablished first to the profunda femoris and then the superficial femoral artery. Any gaps or bleeding sites in the suture line were easily controlled with a 6-0 Prolene suture.   At this point we move forward with the interventional portion and reconstruction of the SFA popliteal and tibial vessels.  I began by accessing the midportion of the patch with a Seldinger needle and then advancing a J-wire to a point that allowed for placement of a 7 French Pinnacle sheath.  Next using a advantage wire and a Kumpe catheter I was able to negotiate the wire and catheter down the SFA and up to the point of the occlusion.  Magnified imaging of the popliteal then allowed crossing using a combination of the Kumpe catheter the advantage wire and a CXI catheter.  Confirmation of intraluminal positioning and distal runoff was then obtained in the posterior tibial by injection through the CXI catheter.  V 18 wire was then placed in position with the tip down by the ankle.  I  began the reconstruction by using a 2 mm x 30 cm balloon angioplasty the proximal posterior tibial and then moving the angioplasty balloon toward the groin 3 separate inflations were required.   Next beginning at the groin hand-injection contrast demonstrated where the distal end of the patch was located.  Initially I placed a 7 mm x 150 mm Viabahn which I postdilated using a 6 mm balloon inflation was to 12 atm for approximately 1 minute.  Following this I placed a 6 mm x 150 mm life stent and this with a 6 mm x 150 mm Lutonix drug-eluting balloon inflated to 12 atm for 1 minute.  Attempts at placing another stent were unsuccessful because the stent would not cross and so the life stent was removed and a 4 mm balloon was advanced across the distal SFA and popliteal.  Inflation was to 12 atm for 30 seconds.  Reinserting a second 6 mm x 150 mm life stent now across the lesions and was deployed without difficulty.  The stent was postdilated with a 5 mm x 150 mm Lutonix drug-eluting balloon inflated to 12 atm for 1 minute.  Follow-up imaging now demonstrated persistent occlusion in the tibioperoneal trunk and proximal posterior tibial.  A 3 mm x 100 mm balloon was then advanced across this lesion inflated to 10 atm for 1 minute.  This did not improve the lesions with greater than 70% residual stenosis and therefore I elected to place a life stent extending into the posterior tibial to encompass this proximal disease.  This does not change the potential for a distal bypass as we would have to bypass to the mid to distal posterior tibial anyway where the artery was relatively free of hemodynamically significant stenosis.  A 5 mm x 150 mm life stent was then deployed with its distal endpoint in the posterior tibial.  It was then postdilated with a 4 mm x 150 mm Lutonix drug-eluting balloon.  Finally the segment of the distal popliteal that was not covered extending from the second 6 mm life stent to the proximal 5 mm life stent was covered with a 6 mm x 60 mm life stent postdilated with a 5 mm Lutonix drug-eluting balloon.  Follow-up imaging demonstrated that the reconstruction was now widely patent however flow  was very sluggish and appear to be consistent by direct injection via a CXI catheter and the posterior tibial with distal spasm.  Therefore a total of 800 mcg of intra-arterial nitroglycerin was administered directly into the posterior tibial via the CXI catheter.  After this flow was substantially improved with the arterial reconstruction now demonstrating less than 20% residual stenosis from the origin of the SFA down to the posterior tibial.  A pursestring suture of 5-0 Prolene was then placed around the 7 French sheath and the sheath were removed without difficulty the pursestring was secured.  Dr. Lucky Cowboy and I then returned the detector to the groin where a steep oblique view was performed to demonstrate a magnified image of the profunda femoris and again confirm the burden of plaque that was present.  Given this high-grade lesion and the future dependency on the profunda femoris I felt that these extenuating circumstances warranted direct intervention with a second endarterectomy.  With Dr. Lucky Cowboy working on the patient's right side and myself working on the patient's left a second core matrix patch was delivered onto the field.  The profunda femoris vein was ligated and divided between 2-0 silk ties of 5-0 Prolene stick ties.  The profunda femoris was then dissected circumferentially for a distance of approximately 8 cm in each branch was looped with a blue Silastic vessel loop.  A profunda clamp was then placed across the origin of the profunda femoris allowing flow to continue through the femoral-popliteal reconstruction.  Once the profunda femoris had been exposed arteriotomy was made with 11 blade scalpel and extended with Potts scissors.  Endarterectomy was then performed with a freer elevator under direct visualizations.  Distal intima was tacked down with four 7-0 interrupted Prolene sutures.  CorMatrix patch was then fashioned to the arteriotomy and applied using 6-0 Prolene beginning at each apex and  sewing toward the middle.  Doppler is then delivered onto the field and the SFA as well as the profunda femoris arteries were interrogated and found to have triphasic Doppler signals.  The left groin was then irrigated copiously with sterile saline and Exparel with Marcaine was infiltrated throughout the wound.  Subsequently Evicel and Surgicel were placed in the wound. The incision was repaired with a double layer of 2-0 Vicryl, a double layer of 3-0 Vicryl, and a layer of 4-0 Monocryl in a subcuticular fashion.  The skin was cleaned, dried, and reinforced with Dermabond.  COMPLICATIONS: None  CONDITION: Carlynn Purl, M.D. [facility] Office: [phone]  07/13/2020, 1:42 PM

## 2020-07-13 NOTE — Progress Notes (Signed)
Night of surgery patient check  S: Patient denies pain at the groin incision.  He states his foot is feeling better.  O: Patient is afebrile his vital signs are stable.      the left groin incision is clean dry and intact no evidence of hematoma.      there is a palpable 2+ posterior tibial pulse left foot left foot is warm and hyperemic.  A: Atherosclerotic occlusive disease bilateral lower extremities with gangrene of left foot status post femoral endarterectomy with angioplasty and stent placement of the femoral-popliteal and tibial arteries.  P: Continue as ordered patient has already been up and ambulated.  He has been evaluated by physical therapy.  Typically I would plan for discharge on postoperative day #2 however he does seem to be doing fairly well and is ahead of schedule and we will reassess this tomorrow.

## 2020-07-13 NOTE — Interval H&P Note (Signed)
History and Physical Interval Note:  07/13/2020 7:26 AM  Samuel Carney  has presented today for surgery, with the diagnosis of ASO WITH ULCERATION.  The various methods of treatment have been discussed with the patient and family. After consideration of risks, benefits and other options for treatment, the patient has consented to  Procedure(s): ENDARTERECTOMY FEMORAL ( SFA STENT) (Left) APPLICATION OF CELL SAVER (N/A) as a surgical intervention.  The patient's history has been reviewed, patient examined, no change in status, stable for surgery.  I have reviewed the patient's chart and labs.  Questions were answered to the patient's satisfaction.     [name]

## 2020-07-13 NOTE — Plan of Care (Signed)
  Problem: Education: Goal: Individualized Educational Video(s) 07/13/2020 1601 by Freddi Che, RN Outcome: Progressing 07/13/2020 1600 by Freddi Che, RN Outcome: Progressing   Problem: Cardiovascular: Goal: Ability to achieve and maintain adequate cardiovascular perfusion will improve 07/13/2020 1601 by Freddi Che, RN Outcome: Progressing 07/13/2020 1600 by Freddi Che, RN Outcome: Progressing Goal: Vascular access site(s) Level 0-1 will be maintained 07/13/2020 1601 by Freddi Che, RN Outcome: Progressing 07/13/2020 1600 by Freddi Che, RN Outcome: Progressing

## 2020-07-13 NOTE — Plan of Care (Incomplete)
  Problem: Education: Goal: Individualized Educational Video(s) Outcome: Progressing   Problem: Cardiovascular: Goal: Ability to achieve and maintain adequate cardiovascular perfusion will improve Outcome: Progressing Goal: Vascular access site(s) Level 0-1 will be maintained Outcome: Progressing

## 2020-07-13 NOTE — Anesthesia Procedure Notes (Addendum)
Procedure Name: Intubation Date/Time: 07/13/2020 7:42 AM Performed by: Joellyn Quails, RN Pre-anesthesia Checklist: Patient identified, Emergency Drugs available, Suction available and Patient being monitored Patient Re-evaluated:Patient Re-evaluated prior to induction Oxygen Delivery Method: Circle system utilized Preoxygenation: Pre-oxygenation with 100% oxygen Induction Type: IV induction Ventilation: Mask ventilation without difficulty Laryngoscope Size: McGraph and 4 Grade View: Grade I Tube type: Oral Tube size: 7.5 mm Number of attempts: 1 Airway Equipment and Method: Stylet and Oral airway Placement Confirmation: ETT inserted through vocal cords under direct vision,  positive ETCO2 and breath sounds checked- equal and bilateral Secured at: 21 cm Tube secured with: Tape Dental Injury: Teeth and Oropharynx as per pre-operative assessment

## 2020-07-13 NOTE — Anesthesia Postprocedure Evaluation (Signed)
Anesthesia Post Note  Patient: Samuel Carney  Procedure(s) Performed: ENDARTERECTOMY FEMORAL ( SFA STENT) (Left Leg Upper) APPLICATION OF CELL SAVER (N/A Leg Upper)  Patient location during evaluation: PACU Anesthesia Type: General Level of consciousness: awake and alert Pain management: pain level controlled Vital Signs Assessment: post-procedure vital signs reviewed and stable Respiratory status: spontaneous breathing, nonlabored ventilation and respiratory function stable Cardiovascular status: blood pressure returned to baseline and stable Postop Assessment: no apparent nausea or vomiting Anesthetic complications: no   No complications documented.   Last Vitals:  Vitals:   07/13/20 1415 07/13/20 1439  BP: 124/74 116/76  Pulse: 63 (!) 58  Resp: 11 15  Temp: (!) 36.2 C   SpO2: 99% 96%    Last Pain:  Vitals:   07/13/20 1415  TempSrc:   PainSc: Asleep                 Tera Mater

## 2020-07-13 NOTE — Evaluation (Signed)
Physical Therapy Evaluation Patient Details Name: Samuel Carney MRN: [id number] DOB: 1945/06/23 Today's Date: 07/13/2020   History of Present Illness  Pt is a 75 yo male diagnosed with atherosclerotic occlusive disease of the left lower extremity with gangrene of the foot and is s/p LLE revascularization.  PMH includes: COPD, CAD, PVD, and HTN.    Clinical Impression  Pt was pleasant and motivated to participate during the session.  Pt's nurse present throughout session during mobility tasks to assist with lines/leads/tubes with pt having arterial line in the left lower arm.  Pt required extra time and effort with tasks but no physical assistance.  Pt given frequent cues not to attempt to use his LUE during functional tasks secondary to arterial line which contributed to the increase in effort needed to perform tasks.  Pt was generally steady with gait using HHA with min lean through this PT's hand for support while walking.  Pt reported no adverse symptoms during the session with SpO2 and HR WNL on room air.  Pt will benefit from HHPT upon discharge to safely address deficits listed in patient problem list for decreased caregiver assistance and eventual return to PLOF.       Follow Up Recommendations Home health PT;Supervision for mobility/OOB    Equipment Recommendations  Other (comment) (TBD)    Recommendations for Other Services       Precautions / Restrictions Precautions Precautions: Fall Restrictions Weight Bearing Restrictions: No Other Position/Activity Restrictions: LUE arterial line in place      Mobility  Bed Mobility Overal bed mobility: Modified Independent             General bed mobility comments: Extra time and effort with mod verbal and tactile cues to avoid using the LUE; nurse present throughout session during mobility tasks to assist with lines/leads/tubes    Transfers Overall transfer level: Needs assistance Equipment used: 1 person hand held  assist Transfers: Sit to/from Stand Sit to Stand: Min guard;+2 safety/equipment         General transfer comment: CGA with +1 HHA with pt utilizing HHA minimally for support  Ambulation/Gait Ambulation/Gait assistance: Min guard;+2 safety/equipment Gait Distance (Feet): 20 Feet Assistive device: 1 person hand held assist Gait Pattern/deviations: Step-through pattern;Decreased step length - right;Decreased step length - left Gait velocity: decreased   General Gait Details: Pt generally steady with ambulation with min support on HHA side (RUE), SpO2 and HR WNL  Stairs            Wheelchair Mobility    Modified Rankin (Stroke Patients Only)       Balance Overall balance assessment: Needs assistance Sitting-balance support: No upper extremity supported Sitting balance-Leahy Scale: Good     Standing balance support: Single extremity supported;During functional activity Standing balance-Leahy Scale: Good                               Pertinent Vitals/Pain Pain Assessment: 0-10 Pain Score: 1  Pain Location: LLE Pain Descriptors / Indicators: Sore Pain Intervention(s): Premedicated before session;Monitored during session    Home Living Family/patient expects to be discharged to:: Private residence Living Arrangements: Spouse/significant other Available Help at Discharge: Family;Available 24 hours/day Type of Home: House Home Access: Stairs to enter Entrance Stairs-Rails: None Entrance Stairs-Number of Steps: 3 Home Layout: One level Home Equipment: Cane - single point      Prior Function Level of Independence: Independent  Comments: Ind amb community distances without an AD, no fall history, Ind with ADLs     Hand Dominance        Extremity/Trunk Assessment   Upper Extremity Assessment Upper Extremity Assessment: LUE deficits/detail;RUE deficits/detail RUE Deficits / Details: RUE strength WNL LUE Deficits / Details: LUE  arterial line in place, strength/ROM NT LUE: Unable to fully assess due to immobilization    Lower Extremity Assessment Lower Extremity Assessment: Generalized weakness       Communication   Communication: No difficulties  Cognition Arousal/Alertness: Awake/alert Behavior During Therapy: WFL for tasks assessed/performed Overall Cognitive Status: Within Functional Limits for tasks assessed                                        General Comments      Exercises Total Joint Exercises Ankle Circles/Pumps: AROM;Strengthening;Both;10 reps Quad Sets: 10 reps;Both;Strengthening Gluteal Sets: Strengthening;Both;10 reps Long Arc Quad: AROM;Strengthening;Both;10 reps Knee Flexion: 10 reps;Both;Strengthening;AROM Marching in Standing: AROM;Strengthening;Both;5 reps;Standing Other Exercises Other Exercises: HEP education for BLE APs, QS, GS, and LAQs x 10 each every 1-2 hours daily   Assessment/Plan    PT Assessment Patient needs continued PT services  PT Problem List Decreased strength;Decreased activity tolerance;Decreased balance;Decreased mobility;Decreased knowledge of use of DME       PT Treatment Interventions DME instruction;Gait training;Stair training;Functional mobility training;Therapeutic activities;Therapeutic exercise;Balance training;Patient/family education    PT Goals (Current goals can be found in the Care Plan section)  Acute Rehab PT Goals Patient Stated Goal: To walk greater distances PT Goal Formulation: With patient Time For Goal Achievement: 07/26/20 Potential to Achieve Goals: Good    Frequency Min 2X/week   Barriers to discharge        Co-evaluation               AM-PAC PT "6 Clicks" Mobility  Outcome Measure Help needed turning from your back to your side while in a flat bed without using bedrails?: A Little Help needed moving from lying on your back to sitting on the side of a flat bed without using bedrails?: A  Little Help needed moving to and from a bed to a chair (including a wheelchair)?: A Little Help needed standing up from a chair using your arms (e.g., wheelchair or bedside chair)?: A Little Help needed to walk in hospital room?: A Little Help needed climbing 3-5 steps with a railing? : A Little 6 Click Score: 18    End of Session Equipment Utilized During Treatment: Gait belt Activity Tolerance: Patient tolerated treatment well Patient left: in bed;with call bell/phone within reach;with bed alarm set;with family/visitor present;with SCD's reapplied;Other (comment) (SCD to RLE only as pt was found) Nurse Communication: Mobility status PT Visit Diagnosis: Difficulty in walking, not elsewhere classified (R26.2);Muscle weakness (generalized) (M62.81);Pain Pain - Right/Left: Left Pain - part of body: Leg    Time: 6045-4098 PT Time Calculation (min) (ACUTE ONLY): 32 min   Charges:   PT Evaluation $PT Eval Moderate Complexity: 1 Mod PT Treatments $Therapeutic Exercise: 8-22 mins        D. Royetta Asal PT, DPT 07/13/20, 5:27 PM

## 2020-07-13 NOTE — Anesthesia Preprocedure Evaluation (Addendum)
Anesthesia Evaluation  Patient identified by MRN, date of birth, ID band Patient awake    Reviewed: Allergy & Precautions, H&P , NPO status , Patient's Chart, lab work & pertinent test results  History of Anesthesia Complications Negative for: history of anesthetic complications  Airway Mallampati: II  TM Distance: >3 FB     Dental  (+) Edentulous Upper, Missing   Pulmonary neg sleep apnea, COPD, Current Smoker,    breath sounds clear to auscultation       Cardiovascular hypertension, (-) angina+ CAD and + Peripheral Vascular Disease  (-) Past MI and (-) Cardiac Stents (-) dysrhythmias  Rhythm:regular Rate:Normal     Neuro/Psych negative neurological ROS  negative psych ROS   GI/Hepatic GERD  ,(+) Cirrhosis     substance abuse  alcohol use,   Endo/Other  negative endocrine ROS  Renal/GU      Musculoskeletal   Abdominal   Peds  Hematology negative hematology ROS (+)   Anesthesia Other Findings Past Medical History: No date: Alcohol abuse No date: Aortic atherosclerosis (HCC) No date: B12 deficiency No date: Cirrhosis ([place]) No date: COPD (chronic obstructive pulmonary disease) (HCC) No date: Coronary artery disease No date: GERD (gastroesophageal reflux disease) No date: Glaucoma No date: Grade II diastolic dysfunction No date: History of kidney stones No date: HLD (hyperlipidemia) No date: Hx of radiation therapy No date: Hypertension No date: Lung mass No date: Moderate mitral regurgitation No date: Pneumonia No date: PVD (peripheral vascular disease) ([place]) 2019: Squamous cell carcinoma of lung, right (HCC)  Past Surgical History: No date: COLONOSCOPY WITH PROPOFOL 11/25/2018: COLONOSCOPY WITH PROPOFOL; N/A     Comment:  Procedure: COLONOSCOPY WITH PROPOFOL;  Surgeon:               Lollie Sails, MD;  Location: [facility] ENDOSCOPY;                Service: Endoscopy;  Laterality: N/A; 11/25/2018:  ESOPHAGOGASTRODUODENOSCOPY (EGD) WITH PROPOFOL; N/A     Comment:  Procedure: ESOPHAGOGASTRODUODENOSCOPY (EGD) WITH               PROPOFOL;  Surgeon: Lollie Sails, MD;  Location:               [facility] ENDOSCOPY;  Service: Endoscopy;  Laterality: N/A; 06/07/2020: LOWER EXTREMITY ANGIOGRAPHY; Left     Comment:  Procedure: LOWER EXTREMITY ANGIOGRAPHY;  Surgeon:               Katha Cabal, MD;  Location: [place] [facility];               Service: Cardiovascular;  Laterality: Left;  BMI    Body Mass Index: 21.62 kg/m      Reproductive/Obstetrics negative OB ROS                            Anesthesia Physical Anesthesia Plan  ASA: III  Anesthesia Plan: General ETT   Post-op Pain Management:    Induction:   PONV Risk Score and Plan: Ondansetron, Dexamethasone and Treatment may vary due to age or medical condition  Airway Management Planned:   Additional Equipment: Arterial line  Intra-op Plan:   Post-operative Plan:   Informed Consent: I have reviewed the patients History and Physical, chart, labs and discussed the procedure including the risks, benefits and alternatives for the proposed anesthesia with the patient or authorized representative who has indicated his/her understanding and acceptance.  Dental Advisory Given  Plan Discussed with: Anesthesiologist, CRNA and Surgeon  Anesthesia Plan Comments:        Anesthesia Quick Evaluation

## 2020-07-13 NOTE — Op Note (Signed)
OPERATIVE NOTE   PROCEDURE: 1. Left common femoral, profunda femoris, and superficial femoral artery endarterectomies and patch angioplasty 2.   Left lower extremity angiogram 3.   Angioplasty and multiple stent placement to the left SFA and popliteal arteries 4.   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 significant plaque in left common femoral, profunda femoris, and superficial femoral arteries 2.  Long segment SFA and popliteal disease as well as severe tibial disease.  SPECIMEN(S): Left common femoral, profunda femoris, and superficial femoral artery plaque.  INDICATIONS:  Patient presents with ulcerations and rest pain of left leg.  Left femoral endarterectomy as well as infrainguinal endoluminal therapy is planned to try to improve perfusion. The risks and benefits as well as alternative therapies including intervention were reviewed in detail all questions were answered the patient agrees to proceed with surgery.  DESCRIPTION: After obtaining full informed written consent, the patient was brought back to the operating room and placed supine upon the operating table. The patient received IV antibiotics prior to induction. After obtaining adequate anesthesia, the patient was prepped and draped in the standard fashion appropriate time out is called.   Vertical incision was created overlying the left femoral arteries. The common femoral artery proximally, and superficial femoral artery, and primary profunda femoris artery branches were encircled with vessel loops and prepared for control. The left femoral arteries were found to have significant plaque  from the common femoral artery into the profunda and superficial femoral arteries.   5000 units of heparin was given and allowed circulate for 5 minutes. Additional heparin was given as needed.  Attention is then turned to the left femoral artery. An arteriotomy is made with 11 blade and extended with Potts scissors in the common femoral artery and carried down onto the first 3-4 cm of the superficial artery. An endarterectomy was then performed. The [facility] was used to create a plane. The proximal endpoint was cut flush with tenotomy scissors. This was in the proximal common femoral artery. An eversion endarterectomy was then performed on the profunda femoris artery. Good backbleeding was then seen.  A fairly extensive bulky plaque went down for several centimeters of the superficial femoral artery requiring an extensive endarterectomy to clear this lesion.  The distal endpoint of the superficial femoral artery endarterectomy was created with gentle traction and the distal endpoint was fairly clean. The Cormatrix patcth is then selected and prepared for a patch angioplasty.  It is cut and beveled and started at the proximal endpoint with a 6-0 Prolene suture.  Approximately one half of the suture line is run medially and laterally and the distal end point was cut and bevelled to match the arteriotomy.  A second 6-0 Prolene was started at the distal end point and run to the mid portion to complete the arteriotomy.  The vessel was flushed prior to release of control and completion of the anastomosis.  At this point, flow was established first to the profunda femoris artery and then to the superficial femoral artery.  Dr. Delana Meyer then performed the endovascular portion of the procedure with me assisting and he will dictate that portion of the procedure. On the completion angiogram after the endovascular revascularization, there was  seen more extensive disease in the profunda femoris artery that went  at least 5 cm beyond the eversion endarterectomy site.  This was creating greater than 50% stenosis and was not well seen on the initial angiogram due to the very steep angle required to see it.  This required a separate and distinct endarterectomy with a separate patch. A profunda clamp was placed proximally in the profunda femoris artery and was controlled to side branches in the distal profunda femoris artery beyond the lesion about 7 cm.  An anterior arteriotomy string created on the profunda femoris artery and a separate endarterectomy was performed with the [facility].  The distal endpoint was tacked down with two 7-0 Prolene sutures.  A separate CorMatrix patch was then brought on the field and cut and beveled to match the arteriotomy.  This was started at the distal end point and run half the length of the arteriotomy.  A second 6-0 Prolene was started at the proximal endpoint and used to complete the arteriotomy after flushing maneuvers were performed.  A palpable pulse was then felt in both the SFA and the profunda femoris artery after completion.  Continuous-wave Doppler was brought on the field and multiphasic waveforms were heard in the common femoral artery, superficial femoral artery, profunda femoris artery well beyond the endarterectomy sites.  We then proceeded with irrigation and closure.   Fibrillar and Vistacel topical hemostatic agents were placed in the femoral incision and hemostasis was complete. The femoral incision was then closed in a layered fashion with 2 layers of 2-0 Vicryl, 2 layers of 3-0 Vicryl, and 4-0 Monocryl for the skin closure. Dermabond and sterile dressing were then placed over the incision.  The patient was then awakened from anesthesia and taken to the recovery room in stable condition having tolerated the procedure well.  COMPLICATIONS: None  CONDITION: Stable     Leotis Pain 07/13/2020 1:19 PM   This note was created with Dragon Medical  transcription system. Any errors in dictation are purely unintentional.

## 2020-07-13 NOTE — Transfer of Care (Signed)
Immediate Anesthesia Transfer of Care Note  Patient: Samuel Carney  Procedure(s) Performed: ENDARTERECTOMY FEMORAL ( SFA STENT) (Left Leg Upper) APPLICATION OF CELL SAVER (N/A Leg Upper)  Patient Location: PACU  Anesthesia Type:General  Level of Consciousness: awake and patient cooperative  Airway & Oxygen Therapy: Patient Spontanous Breathing and Patient connected to face mask oxygen  Post-op Assessment: Report given to RN, Post -op Vital signs reviewed and stable and Patient moving all extremities X 4  Post vital signs: Reviewed and stable  Last Vitals:  Vitals Value Taken Time  BP 117/69 07/13/20 1316  Temp    Pulse 58 07/13/20 1322  Resp 19 07/13/20 1322  SpO2 100 % 07/13/20 1322  Vitals shown include unvalidated device data.  Last Pain:  Vitals:   07/13/20 0617  TempSrc: Temporal  PainSc: 0-No pain         Complications: No complications documented.

## 2020-07-13 NOTE — Anesthesia Procedure Notes (Addendum)
Arterial Line Insertion Start/End4/13/2022 7:52 AM, 07/13/2020 7:57 AM Performed by: Tera Mater, MD  Patient location: OR. Preanesthetic checklist: patient identified, IV checked, site marked, risks and benefits discussed, surgical consent, monitors and equipment checked, pre-op evaluation, timeout performed and anesthesia consent radial was placed Catheter size: 20 Fr Hand hygiene performed , maximum sterile barriers used  and Seldinger technique used  Attempts: 1 Procedure performed using ultrasound guided technique. Following insertion, dressing applied and Biopatch. Post procedure assessment: normal and unchanged

## 2020-07-14 ENCOUNTER — Encounter: Payer: Self-pay | Admitting: Vascular Surgery

## 2020-07-14 DIAGNOSIS — I7025 Atherosclerosis of native arteries of other extremities with ulceration: Secondary | ICD-10-CM | POA: Diagnosis not present

## 2020-07-14 LAB — BASIC METABOLIC PANEL
Anion gap: 8 (ref 5–15)
BUN: 20 mg/dL (ref 8–23)
CO2: 18 mmol/L — ABNORMAL LOW (ref 22–32)
Calcium: 8.2 mg/dL — ABNORMAL LOW (ref 8.9–10.3)
Chloride: 114 mmol/L — ABNORMAL HIGH (ref 98–111)
Creatinine, Ser: 1.16 mg/dL (ref 0.61–1.24)
GFR, Estimated: >60 mL/min (ref >60)
Glucose, Bld: 126 mg/dL — ABNORMAL HIGH (ref 70–99)
Potassium: 4.1 mmol/L (ref 3.5–5.1)
Sodium: 140 mmol/L (ref 135–145)

## 2020-07-14 LAB — CBC
HCT: 26.8 % — ABNORMAL LOW (ref 39.0–52.0)
Hemoglobin: 8.6 g/dL — ABNORMAL LOW (ref 13.0–17.0)
MCH: 29.1 pg (ref 26.0–34.0)
MCHC: 32.1 g/dL (ref 30.0–36.0)
MCV: 90.5 fL (ref 80.0–100.0)
Platelets: 92 10*3/uL — ABNORMAL LOW (ref 150–400)
RBC: 2.96 MIL/uL — ABNORMAL LOW (ref 4.22–5.81)
RDW: 15.3 % (ref 11.5–15.5)
WBC: 9.7 10*3/uL (ref 4.0–10.5)
nRBC: 0 % (ref 0.0–0.2)

## 2020-07-14 MED ORDER — HYDROCODONE-ACETAMINOPHEN 5-325 MG PO TABS: 1.0000 | ORAL_TABLET | Freq: Four times a day (QID) | ORAL | 0 refills | Status: DC | PRN

## 2020-07-14 MED ORDER — ASPIRIN 81 MG PO TBEC: 81.0000 mg | DELAYED_RELEASE_TABLET | Freq: Every day | ORAL | 3 refills | Status: AC

## 2020-07-14 NOTE — Discharge Instructions (Signed)
You may remove your dressing and shower as of tomorrow.  Gently clean your incision with soap and water.  Gently pat dry. Please do not lift heavier than 10 pounds or engage in strenuous activity until you are cleared at your first operative visit Please do not drive for at least 2 weeks. No drinking or driving while taking pain medication.

## 2020-07-14 NOTE — Plan of Care (Signed)
  Problem: Education: Goal: Individualized Educational Video(s) Outcome: Adequate for Discharge   Problem: Cardiovascular: Goal: Ability to achieve and maintain adequate cardiovascular perfusion will improve Outcome: Adequate for Discharge Goal: Vascular access site(s) Level 0-1 will be maintained Outcome: Adequate for Discharge

## 2020-07-14 NOTE — Discharge Summary (Signed)
[facility]    Discharge Summary  Patient ID:  Samuel Carney MRN: [id number] DOB/AGE: 04-17-1945 75 y.o.  Admit date: 07/13/2020 Discharge date: 07/14/2020 Date of Surgery: 07/13/2020 Surgeon: Surgeon(s): Schnier, Dolores Lory, MD Algernon Huxley, MD  Admission Diagnosis: Atherosclerosis of artery of extremity with ulceration ([place]) [I70.299, L97.909]  Discharge Diagnoses:  Atherosclerosis of artery of extremity with ulceration ([place]) [I70.299, L97.909]  Secondary Diagnoses: Past Medical History:  Diagnosis Date  . Alcohol abuse   . Aortic atherosclerosis ([place])   . B12 deficiency   . Cirrhosis ([place])   . COPD (chronic obstructive pulmonary disease) ([place])   . Coronary artery disease   . GERD (gastroesophageal reflux disease)   . Glaucoma   . Grade II diastolic dysfunction   . History of kidney stones   . HLD (hyperlipidemia)   . Hx of radiation therapy   . Hypertension   . Lung mass   . Moderate mitral regurgitation   . Pneumonia   . PVD (peripheral vascular disease) ([place])   . Squamous cell carcinoma of lung, right ([place]) 2019   Procedure(s): 07/13/20: 1. Left common femoral and superficial femoral endarterectomy with Cormatrix patch angioplasty. 2. Left profunda femoris endarterectomy separate and distinct with a separate CorMatrix patch. 3. Open transluminal angioplasty and stent placement left SFA. 4. Open transluminal angioplasty and stent placement left posterior tibial 5. Introduction catheter into posterior tibial artery third order catheter placement with imaging as well as administration of intra-arterial nitroglycerin.  Discharged Condition: Good  HPI / Hospital Course:  Samuel Carney 75 y.o. y.o.male who presents with complaints of lifestyle limiting claudication and pain continuously in the left lower extremity associated with gangrenous changes of the left foot. The patient has documented severe atherosclerotic occlusive disease and has  undergone minimally invasive treatments in the past. However, at this point his primary area of stricture stenosis resides in the common femoral and origins of the superficial femoral and profunda femoris extending into these arteries and therefore this is not amenable to intervention. The patient is therefore undergoing open endarterectomy. On 07/13/20 the patient underwent:  6. Left common femoral and superficial femoral endarterectomy with Cormatrix patch angioplasty. 7. Left profunda femoris endarterectomy separate and distinct with a separate CorMatrix patch. 8. Open transluminal angioplasty and stent placement left SFA. 9. Open transluminal angioplasty and stent placement left posterior tibial 10. Introduction catheter into posterior tibial artery third order catheter placement with imaging as well as administration of intra-arterial nitroglycerin.  He tolerated the procedure well was transferred to the ICU from the operating room for observation overnight.  Patient's night of surgery was unremarkable.  During the patient's brief stay, his diet was advanced, his Foley was removed and he was urinating independently, his discomfort was controlled with the use of p.o. pain medication and he was ambulating at baseline.  Initially, physical therapy recommended home with PT services however the patient adamantly refused these.  The patient also started to try to smoke a cigarette in the ICU and had to be redirected to stop.  Postop day #1 patient insisted on discharge home.  Day of discharge, the patient was afebrile with stable vital signs and essentially unremarkable physical exam.  Physical exam:  Alert and oriented x3, no acute distress Cardiovascular: Regular rate and rhythm Pulmonary: Clear to auscultation bilaterally Abdomen: Soft, nontender, nondistended Left groin incision:  Or dressing clean dry and intact.  Left lower extremity: Thigh soft.  Calf soft.  There is no  acute vascular  compromise noted to the leg on exam today.  Extremities warm distally toes.  Mild edema.  Motor/sensory is intact.  Good capillary refill.  Labs: As below  Complications: None  Consults: None  Significant Diagnostic Studies: CBC Lab Results  Component Value Date   WBC 9.7 07/13/2020   HGB 8.6 (L) 07/13/2020   HCT 26.8 (L) 07/13/2020   MCV 90.5 07/13/2020   PLT 92 (L) 07/13/2020   BMET    Component Value Date/Time   NA 140 07/13/2020 2346   NA 136 12/28/2012 0547   K 4.1 07/13/2020 2346   K 3.5 12/28/2012 0547   CL 114 (H) 07/13/2020 2346   CL 105 12/28/2012 0547   CO2 18 (L) 07/13/2020 2346   CO2 26 12/28/2012 0547   GLUCOSE 126 (H) 07/13/2020 2346   GLUCOSE 96 12/28/2012 0547   BUN 20 07/13/2020 2346   BUN 14 12/28/2012 0547   CREATININE 1.16 07/13/2020 2346   CREATININE 1.09 12/28/2012 0547   CALCIUM 8.2 (L) 07/13/2020 2346   CALCIUM 8.8 12/28/2012 0547   GFRNONAA >60 07/13/2020 2346   GFRNONAA >60 12/28/2012 0547   GFRAA >60 12/28/2012 0547   COAG Lab Results  Component Value Date   INR 1.1 07/11/2020   INR 0.92 10/14/2017   INR 0.9 12/26/2012   Disposition:  Discharge to :Home  Allergies as of 07/14/2020   No Known Allergies     Medication List    TAKE these medications   acetaminophen 500 MG tablet Commonly known as: TYLENOL Take 1,000 mg by mouth every 6 (six) hours as needed.   amLODipine 10 MG tablet Commonly known as: NORVASC Take 1 tablet by mouth daily.   aspirin 81 MG EC tablet Take 1 tablet (81 mg total) by mouth daily at 6 (six) AM. Swallow whole. Start taking on: July 15, 2020   clopidogrel 75 MG tablet Commonly known as: Plavix Take 1 tablet (75 mg total) by mouth daily.   cyanocobalamin 1000 MCG/ML injection Commonly known as: (VITAMIN B-12) Inject 1,000 mcg into the muscle every 30 (thirty) days.   dorzolamide-timolol 22.3-6.8 MG/ML ophthalmic solution Commonly known as: COSOPT Place 1 drop into both eyes 2 (two) times  daily.   doxazosin 2 MG tablet Commonly known as: CARDURA Take 2 mg by mouth daily.   HYDROcodone-acetaminophen 5-325 MG tablet Commonly known as: NORCO/VICODIN Take 1-2 tablets by mouth every 6 (six) hours as needed for moderate pain or severe pain.   metoprolol tartrate 100 MG tablet Commonly known as: LOPRESSOR Take 1 tablet by mouth 2 (two) times daily.   pantoprazole 40 MG tablet Commonly known as: PROTONIX Take 40 mg by mouth daily.   potassium chloride SA 20 MEQ tablet Commonly known as: KLOR-CON Take 1 tablet by mouth daily.   rosuvastatin 5 MG tablet Commonly known as: CRESTOR Take 1 tablet by mouth once daily   telmisartan 80 MG tablet Commonly known as: MICARDIS Take 1 tablet by mouth daily.   Travoprost (BAK Free) 0.004 % Soln ophthalmic solution Commonly known as: TRAVATAN Place 1 drop into both eyes at bedtime.      Verbal and written Discharge instructions given to the patient. Wound care per Discharge AVS  Follow-up Information    Kris Hartmann, NP Follow up in 2 week(s).   Specialty: Vascular Surgery Why: First postoperative visit.  Incision check.  No studies. Contact information: [place] [address] [phone]  SignedSela Hua, PA-C  07/14/2020, 10:34 AM

## 2020-07-14 NOTE — Evaluation (Signed)
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  PMH includes: COPD, CAD, PVD, and HTN.   Clinical Impression   Pt seen for OT evaluation this date. Upon arrival to room, pt seated at EOB with RN at bedside. Pt appearing agitated, stating that he wanted to go home and refusing to answer orientation/PLOF questions, stating "Why are you asking me that? I dont ask you those kinds of questions", however agreeable to assist from OT for dressing. Per chart review, pt was independent in all aspects of ADL/IADL, using a SPC for all mobility, and denying fall history in past 12 months. Pt lives with his spouse in a 1 story home with 3 steps to enter. Pt currently presents with decreased safety awareness and impulsivity and requires SUPERVISION/SET-UP for sit>stand UB/LB dressing and MIN GUARD for ambulating ~10 feet with surgical boot donned. Upon discharge, recommend 24/7 assistance/supervision from family. No OT follow up recommended. OT to sign off. Please re-consult if additional OT needs arise.    Follow Up Recommendations  No OT follow up;Supervision/Assistance - 24 hour    Equipment Recommendations  None recommended by OT       Precautions / Restrictions Precautions Precautions: Fall Restrictions Weight Bearing Restrictions: No Other Position/Activity Restrictions: LUE arterial line removed      Mobility Bed Mobility               General bed mobility comments: Sitting on EOB upon arrival with RN Patient Response: Impulsive  Transfers Overall transfer level: Needs assistance   Transfers: Sit to/from Stand Sit to Stand: Min guard              Balance Overall balance assessment: Needs assistance Sitting-balance  support: No upper extremity supported;Feet supported Sitting balance-Leahy Scale: Good Sitting balance - Comments: Good sitting balance at EOB while reaching outside of BOS to don socks/shoes   Standing balance support: No upper extremity supported;During functional activity Standing balance-Leahy Scale: Good Standing balance comment: Able to walk ~10 feet. MIN GUARD for decreased balance with surgical boot donned                           ADL either performed or assessed with clinical judgement   ADL Overall ADL's : Needs assistance/impaired                 Upper Body Dressing : Set up;Standing Upper Body Dressing Details (indicate cue type and reason): to don overhead shirt and button-up shirt. MIN cues for managing buttons Lower Body Dressing: Supervision/safety;Sit to/from stand Lower Body Dressing Details (indicate cue type and reason): to don underwear, pants, socks, and shoes             Functional mobility during ADLs: Min guard (~10 feet without AD)                    Pertinent Vitals/Pain Pain Assessment: No/denies pain        Extremity/Trunk Assessment Upper Extremity Assessment Upper Extremity Assessment: Overall WFL for tasks assessed   Lower Extremity Assessment Lower Extremity Assessment: Generalized weakness       Communication Communication Communication: No difficulties   Cognition Arousal/Alertness: Awake/alert Behavior During Therapy: Agitated;Impulsive Overall Cognitive Status:  No family/caregiver present to determine baseline cognitive functioning                                 General Comments: Pt agitated, stating that he wants to go home and refusing to answer orientation/PLOF questions, stating "Why are you asking me that? I dont ask you those kinds of questions", however agreeable to assist from OT for dressing.              Home Living Family/patient expects to be discharged to:: Private  residence Living Arrangements: Spouse/significant other Available Help at Discharge: Family;Available 24 hours/day Type of Home: House Home Access: Stairs to enter [employer] of Steps: 3 Entrance Stairs-Rails: None Home Layout: One level     Bathroom Shower/Tub: Teacher, early years/pre: Handicapped height     Home Equipment: Cane - single point          Prior Functioning/Environment Level of Independence: Independent        Comments: Ind amb community distances without an AD, no fall history, Ind with ADLs        OT Problem List: Impaired balance (sitting and/or standing);Decreased safety awareness      OT Treatment/Interventions:      OT Goals(Current goals can be found in the care plan section) Acute Rehab OT Goals Patient Stated Goal: to go home OT Goal Formulation: With patient Time For Goal Achievement: 07/28/20 Potential to Achieve Goals: Good  OT Frequency:      AM-PAC OT "6 Clicks" Daily Activity     Outcome Measure Help from another person eating meals?: None Help from another person taking care of personal grooming?: None Help from another person toileting, which includes using toliet, bedpan, or urinal?: A Little Help from another person bathing (including washing, rinsing, drying)?: A Little Help from another person to put on and taking off regular upper body clothing?: A Little Help from another person to put on and taking off regular lower body clothing?: A Little 6 Click Score: 20   End of Session Equipment Utilized During Treatment: Other (comment) (L surgical boot) Nurse Communication: Mobility status  Activity Tolerance: Treatment limited secondary to agitation Patient left: in bed;with call bell/phone within reach;with bed alarm set  OT Visit Diagnosis: Unsteadiness on feet (R26.81)                Time: 1000-1020 OT Time Calculation (min): 20 min Charges:  OT General Charges $OT Visit: 1 Visit OT Evaluation $OT  Eval Moderate Complexity: 1 Mod OT Treatments $Self Care/Home Management : 8-22 mins  Fredirick Maudlin, OTR/L [place]

## 2020-07-14 NOTE — Progress Notes (Signed)
Patient attempted to smoke a cigarette in his hospital room. Cigarettes taken away from patient and educated on smoking policy. Patient increasingly agitation, starting shortly after this incident, approximately 10 AM. He is demanding to be discharged immediately, stating he is ready to go home. Attempted to explain discharge process, and that we would have PT/OT evaluate him prior to discharge to make sure he doesn't need any equipment or supplies at home. Patient refusing to see PT/OT and is refusing any home health services. Made multiple attempts to get OOB and exit his room. He become verbally and phyically aggressive towards staff, demanding to leave. He refused his morning medications.   Called patient's daughter, to discuss transport arrangements. She states that she or a family member will come to pick patient up.   Dr. Lucky Cowboy and Hezzie Bump made aware of patient's refusal of care and demands to go home at this time; proceeding with discharge as planned.   Patient disconnected from bedside monitor, IVs removed. Patient belongings returned. Transported out of unit via wheelchair.

## 2020-07-15 LAB — SURGICAL PATHOLOGY

## 2020-07-18 ENCOUNTER — Telehealth (INDEPENDENT_AMBULATORY_CARE_PROVIDER_SITE_OTHER): Payer: Self-pay

## 2020-07-18 NOTE — Telephone Encounter (Signed)
Please call an schedule the the pt for an appt and U/S

## 2020-07-18 NOTE — Telephone Encounter (Signed)
Pt's daughter called and left a VM on the nurses line saying her father had a procedure last Wednesday  And  Has been having swelling in his Lt knee and foot he is also not getting any relief from the pain medication. She wanted to know was this normal. The pt had a  Femoral Endarterectomy on his  Left leg per Dr. Delana Meyer he should elevate for the swelling but for the pain he should come in for an U/S ABI and possibly an Office visit  Based on the U/S and ABI. Please schedule.

## 2020-07-19 ENCOUNTER — Other Ambulatory Visit (INDEPENDENT_AMBULATORY_CARE_PROVIDER_SITE_OTHER): Payer: Self-pay | Admitting: Vascular Surgery

## 2020-07-19 DIAGNOSIS — Z95820 Peripheral vascular angioplasty status with implants and grafts: Secondary | ICD-10-CM

## 2020-07-19 DIAGNOSIS — M7989 Other specified soft tissue disorders: Secondary | ICD-10-CM

## 2020-07-19 NOTE — Telephone Encounter (Signed)
Patient scheduled.

## 2020-07-20 ENCOUNTER — Ambulatory Visit (INDEPENDENT_AMBULATORY_CARE_PROVIDER_SITE_OTHER): Payer: Medicare Other

## 2020-07-20 ENCOUNTER — Other Ambulatory Visit: Payer: Self-pay

## 2020-07-20 DIAGNOSIS — M79605 Pain in left leg: Secondary | ICD-10-CM

## 2020-07-20 DIAGNOSIS — Z95820 Peripheral vascular angioplasty status with implants and grafts: Secondary | ICD-10-CM | POA: Diagnosis not present

## 2020-07-20 DIAGNOSIS — M7989 Other specified soft tissue disorders: Secondary | ICD-10-CM

## 2020-07-28 ENCOUNTER — Ambulatory Visit (INDEPENDENT_AMBULATORY_CARE_PROVIDER_SITE_OTHER): Payer: Medicare Other | Admitting: Nurse Practitioner

## 2020-07-28 ENCOUNTER — Ambulatory Visit (INDEPENDENT_AMBULATORY_CARE_PROVIDER_SITE_OTHER): Payer: Medicare Other

## 2020-07-28 ENCOUNTER — Other Ambulatory Visit: Payer: Self-pay

## 2020-07-28 ENCOUNTER — Other Ambulatory Visit (INDEPENDENT_AMBULATORY_CARE_PROVIDER_SITE_OTHER): Payer: Self-pay | Admitting: Nurse Practitioner

## 2020-07-28 VITALS — BP 108/66 | HR 65 | Ht 71.0 in | Wt 163.0 lb

## 2020-07-28 DIAGNOSIS — Z95820 Peripheral vascular angioplasty status with implants and grafts: Secondary | ICD-10-CM | POA: Diagnosis not present

## 2020-07-28 DIAGNOSIS — M79605 Pain in left leg: Secondary | ICD-10-CM | POA: Diagnosis not present

## 2020-07-28 DIAGNOSIS — I70299 Other atherosclerosis of native arteries of extremities, unspecified extremity: Secondary | ICD-10-CM

## 2020-07-28 DIAGNOSIS — I739 Peripheral vascular disease, unspecified: Secondary | ICD-10-CM

## 2020-07-28 DIAGNOSIS — I10 Essential (primary) hypertension: Secondary | ICD-10-CM

## 2020-07-28 DIAGNOSIS — L97909 Non-pressure chronic ulcer of unspecified part of unspecified lower leg with unspecified severity: Secondary | ICD-10-CM

## 2020-07-28 DIAGNOSIS — Z720 Tobacco use: Secondary | ICD-10-CM

## 2020-07-28 DIAGNOSIS — E782 Mixed hyperlipidemia: Secondary | ICD-10-CM

## 2020-07-31 ENCOUNTER — Encounter (INDEPENDENT_AMBULATORY_CARE_PROVIDER_SITE_OTHER): Payer: Self-pay | Admitting: Nurse Practitioner

## 2020-07-31 NOTE — Progress Notes (Incomplete)
Subjective:    Patient ID: Samuel Carney, male    DOB: 1945/08/20, 75 y.o.   MRN: [id number] Chief Complaint  Patient presents with  . Follow-up    2 wk f/u femoral stent incision check     HPI  Review of Systems     Objective:   Physical Exam Vitals reviewed.  HENT:     Head: Normocephalic.  Cardiovascular:     Rate and Rhythm: Normal rate.     Pulses: Normal pulses.  Pulmonary:     Effort: Pulmonary effort is normal.  Neurological:     Mental Status: He is alert and oriented to person, place, and time.  Psychiatric:        Mood and Affect: Mood normal.        Behavior: Behavior normal.        Thought Content: Thought content normal.        Judgment: Judgment normal.     BP 108/66   Pulse 65   Ht 5\' 11"  (1.803 m)   Wt 163 lb (73.9 kg)   BMI 22.73 kg/m   Past Medical History:  Diagnosis Date  . Alcohol abuse   . Aortic atherosclerosis ([place])   . B12 deficiency   . Cirrhosis ([place])   . COPD (chronic obstructive pulmonary disease) ([place])   . Coronary artery disease   . GERD (gastroesophageal reflux disease)   . Glaucoma   . Grade II diastolic dysfunction   . History of kidney stones   . HLD (hyperlipidemia)   . Hx of radiation therapy   . Hypertension   . Lung mass   . Moderate mitral regurgitation   . Pneumonia   . PVD (peripheral vascular disease) ([place])   . Squamous cell carcinoma of lung, right ([place]) 2019    Social History   Socioeconomic History  . Marital status: Divorced    Spouse name: Not on file  . Number of children: Not on file  . Years of education: Not on file  . Highest education level: Not on file  Occupational History  . Not on file  Tobacco Use  . Smoking status: Current Every Day Smoker    Packs/day: 1.00    Years: 50.00    Pack years: 50.00    Types: Cigarettes  . Smokeless tobacco: Never Used  Vaping Use  . Vaping Use: Never used  Substance and Sexual Activity  . Alcohol use: Yes    Comment: "1 pint of liquor per  day"  . Drug use: Never  . Sexual activity: Yes  Other Topics Concern  . Not on file  Social History Narrative   Live in girl friend   Social Determinants of Radio broadcast assistant Strain: Not on file  Food Insecurity: Not on file  Transportation Needs: Not on file  Physical Activity: Not on file  Stress: Not on file  Social Connections: Not on file  Intimate Partner Violence: Not on file    Past Surgical History:  Procedure Laterality Date  . COLONOSCOPY WITH PROPOFOL    . COLONOSCOPY WITH PROPOFOL N/A 11/25/2018   Procedure: COLONOSCOPY WITH PROPOFOL;  Surgeon: Lollie Sails, MD;  [facility] ENDOSCOPY;  Service: Endoscopy;  Laterality: N/A;  . ENDARTERECTOMY FEMORAL Left 07/13/2020   Procedure: ENDARTERECTOMY FEMORAL ( SFA STENT);  Surgeon: Katha Cabal, MD;  Location: [facility] ORS;  Service: Vascular;  Laterality: Left;  . ESOPHAGOGASTRODUODENOSCOPY (EGD) WITH PROPOFOL N/A 11/25/2018   Procedure: ESOPHAGOGASTRODUODENOSCOPY (EGD) WITH  PROPOFOL;  Surgeon: Lollie Sails, MD;  Location: [facility] ENDOSCOPY;  Service: Endoscopy;  Laterality: N/A;  . LOWER EXTREMITY ANGIOGRAPHY Left 06/07/2020   Procedure: LOWER EXTREMITY ANGIOGRAPHY;  Surgeon: Katha Cabal, MD;  Location: [place] CV LAB;  Service: Cardiovascular;  Laterality: Left;    Family History  Problem Relation Age of Onset  . Stroke Brother   . Diabetes Mellitus II Brother   . Diabetes Mellitus II Mother   . Diabetes Mellitus II Maternal Uncle     No Known Allergies  CBC Latest Ref Rng & Units 07/13/2020 07/13/2020 07/11/2020  WBC 4.0 - 10.5 K/uL 9.7 6.4 5.9  Hemoglobin 13.0 - 17.0 g/dL 8.6(L) 8.9(L) 10.4(L)  Hematocrit 39.0 - 52.0 % 26.8(L) 28.6(L) 33.6(L)  Platelets 150 - 400 K/uL 92(L) 100(L) 137(L)      CMP     Component Value Date/Time   NA 140 07/13/2020 2346   NA 136 12/28/2012 0547   K 4.1 07/13/2020 2346   K 3.5 12/28/2012 0547   CL 114 (H) 07/13/2020 2346   CL 105  12/28/2012 0547   CO2 18 (L) 07/13/2020 2346   CO2 26 12/28/2012 0547   GLUCOSE 126 (H) 07/13/2020 2346   GLUCOSE 96 12/28/2012 0547   BUN 20 07/13/2020 2346   BUN 14 12/28/2012 0547   CREATININE 1.16 07/13/2020 2346   CREATININE 1.09 12/28/2012 0547   CALCIUM 8.2 (L) 07/13/2020 2346   CALCIUM 8.8 12/28/2012 0547   PROT 6.9 12/28/2012 0547   ALBUMIN 3.1 (L) 12/28/2012 0547   AST 32 12/28/2012 0547   ALT 16 12/28/2012 0547   ALKPHOS 91 12/28/2012 0547   BILITOT 1.0 12/28/2012 0547   GFRNONAA >60 07/13/2020 2346   GFRNONAA >60 12/28/2012 0547   GFRAA >60 12/28/2012 0547     VAS [place] ABI WITH/WO TBI  Result Date: 07/21/2020 LOWER EXTREMITY DOPPLER STUDY Indications: Peripheral artery disease. Other Factors: Left foot pain.  Vascular Interventions: 06/07/2020 PTA and stent of LEIA.                         07/13/2020 left endart and stents in SFA,Pop, and PTA. Performing Technologist: Concha Norway RVT  Examination Guidelines: A complete evaluation includes at minimum, Doppler waveform signals and systolic blood pressure reading at the level of bilateral brachial, anterior tibial, and posterior tibial arteries, when vessel segments are accessible. Bilateral testing is considered an integral part of a complete examination. Photoelectric Plethysmograph (PPG) waveforms and toe systolic pressure readings are included as required and additional duplex testing as needed. Limited examinations for reoccurring indications may be performed as noted.  ABI Findings: +---------+------------------+-----+----------+--------+ Right    Rt Pressure (mmHg)IndexWaveform  Comment  +---------+------------------+-----+----------+--------+ Brachial 128                                       +---------+------------------+-----+----------+--------+ ATA      124               0.97 monophasic         +---------+------------------+-----+----------+--------+ PTA      120               0.94 biphasic            +---------+------------------+-----+----------+--------+ Great Toe116               0.91 Normal             +---------+------------------+-----+----------+--------+ +---------+------------------+-----+----------+-------+  Left     Lt Pressure (mmHg)IndexWaveform  Comment +---------+------------------+-----+----------+-------+ ATA      124               0.97 monophasic        +---------+------------------+-----+----------+-------+ PTA      155               1.21 biphasic          +---------+------------------+-----+----------+-------+ Great Toe                       Normal            +---------+------------------+-----+----------+-------+ +-------+-----------+-----------+------------+------------+ ABI/TBIToday's ABIToday's TBIPrevious ABIPrevious TBI +-------+-----------+-----------+------------+------------+ Right  .97        .91        .90         .81          +-------+-----------+-----------+------------+------------+ Left   1.21       .93        .67         .19          +-------+-----------+-----------+------------+------------+ Left ABIs and TBIs appear increased compared to prior study on 06/13/2020.  Summary: Right: Resting right ankle-brachial index is within normal range. No evidence of significant right lower extremity arterial disease. The right toe-brachial index is normal. Left: Resting left ankle-brachial index is within normal range. No evidence of significant left lower extremity arterial disease. The left toe-brachial index is normal.  *See table(s) above for measurements and observations.  Electronically signed by [name] MD on 07/21/2020 at 5:10:23 PM.    Final    ABI WITH/WO TBI  Result Date: 06/13/2020 LOWER EXTREMITY DOPPLER STUDY Indications: Peripheral artery disease. Other Factors: Left foot pain.  Vascular Interventions: 06/07/2020 PTA and stent of LEIA. Comparison Study: 05/19/2020 Performing Technologist: Charlane Ferretti RT (R)(VS)   Examination Guidelines: A complete evaluation includes at minimum, Doppler waveform signals and systolic blood pressure reading at the level of bilateral brachial, anterior tibial, and posterior tibial arteries, when vessel segments are accessible. Bilateral testing is considered an integral part of a complete examination. Photoelectric Plethysmograph (PPG) waveforms and toe systolic pressure readings are included as required and additional duplex testing as needed. Limited examinations for reoccurring indications may be performed as noted.  ABI Findings: +---------+------------------+-----+----------+--------+ Right    Rt Pressure (mmHg)IndexWaveform  Comment  +---------+------------------+-----+----------+--------+ Brachial 130                                       +---------+------------------+-----+----------+--------+ ATA      105               0.81 monophasic         +---------+------------------+-----+----------+--------+ PTA      117               0.90 monophasic         +---------+------------------+-----+----------+--------+ Great Toe105               0.81 Abnormal           +---------+------------------+-----+----------+--------+ +---------+------------------+-----+----------+-------+ Left     Lt Pressure (mmHg)IndexWaveform  Comment +---------+------------------+-----+----------+-------+ Brachial 129                                      +---------+------------------+-----+----------+-------+ ATA  78                0.60 monophasic        +---------+------------------+-----+----------+-------+ PTA      87                0.67 monophasic        +---------+------------------+-----+----------+-------+ Great Toe25                0.19 Abnormal          +---------+------------------+-----+----------+-------+ +-------+-----------+-----------+------------+------------+ ABI/TBIToday's ABIToday's TBIPrevious ABIPrevious TBI  +-------+-----------+-----------+------------+------------+ Right  .90        .81        .83         .76          +-------+-----------+-----------+------------+------------+ Left   .67        .19        .73         .30          +-------+-----------+-----------+------------+------------+ Bilateral ABIs appear essentially unchanged compared to prior study on 05/19/2020. Bilateral TBIs appear essentially unchanged compared to prior study on 05/19/2020.  Summary: Right: Resting right ankle-brachial index indicates mild right lower extremity arterial disease. The right toe-brachial index is normal. Left: Resting left ankle-brachial index indicates moderate left lower extremity arterial disease. The left toe-brachial index is abnormal. *See table(s) above for measurements and observations.  Electronically signed by [name] MD on 06/13/2020 at 12:42:19 PM.   Final        Assessment & Plan:   1. Atherosclerosis of artery of extremity with ulceration ([place]) The patient notes that he is not having worsening claudication-like symptoms however the swelling is giving him discomfort.  Noninvasive studies today do show there is a left groin/proximal thigh area of heterogeneous echogenicity with no internal flow.  It measures roughly 4.3 x 2.3 x 1.5 cm.  No evidence of DVT is seen.  Because there is no flow seen within patient is advised to utilize warm compresses to help with decreasing swelling.  It is noted that they may see some bruising.  He should also utilize a compression sock to help with edema as well.  Elevation is also advised.  We will have the patient return in 1 month to evaluate progression with edema. 2. Hyperlipidemia, mixed Continue statin as ordered and reviewed, no changes at this time   3. Benign essential hypertension Continue antihypertensive medications as already ordered, these medications have been reviewed and there are no changes at this time.   4. Tobacco abuse Smoking  cessation was discussed, 3-10 minutes spent on this topic specifically    Current Outpatient Medications on File Prior to Visit  Medication Sig Dispense Refill  . acetaminophen (TYLENOL) 500 MG tablet Take 1,000 mg by mouth every 6 (six) hours as needed.    Marland Kitchen amLODipine (NORVASC) 10 MG tablet Take 1 tablet by mouth daily.    Marland Kitchen aspirin EC 81 MG EC tablet Take 1 tablet (81 mg total) by mouth daily at 6 (six) AM. Swallow whole. 90 tablet 3  . clopidogrel (PLAVIX) 75 MG tablet Take 1 tablet (75 mg total) by mouth daily. 30 tablet 11  . cyanocobalamin (,VITAMIN B-12,) 1000 MCG/ML injection Inject 1,000 mcg into the muscle every 30 (thirty) days.    . dorzolamide-timolol (COSOPT) 22.3-6.8 MG/ML ophthalmic solution Place 1 drop into both eyes 2 (two) times daily.  6  . doxazosin (CARDURA) 2 MG tablet Take 2 mg by mouth daily.  1  . HYDROcodone-acetaminophen (NORCO/VICODIN) 5-325 MG tablet Take 1-2 tablets by mouth every 6 (six) hours as needed for moderate pain or severe pain. 50 tablet 0  . metoprolol tartrate (LOPRESSOR) 100 MG tablet Take 1 tablet by mouth 2 (two) times daily.    . pantoprazole (PROTONIX) 40 MG tablet Take 40 mg by mouth daily.    . potassium chloride SA (KLOR-CON) 20 MEQ tablet Take 1 tablet by mouth daily.    . rosuvastatin (CRESTOR) 5 MG tablet Take 1 tablet by mouth once daily 90 tablet 3  . Travoprost, BAK Free, (TRAVATAN) 0.004 % SOLN ophthalmic solution Place 1 drop into both eyes at bedtime.    Marland Kitchen telmisartan (MICARDIS) 80 MG tablet Take 1 tablet by mouth daily.     No current facility-administered medications on file prior to visit.    There are no Patient Instructions on file for this visit. No follow-ups on file.   Kris Hartmann, NP

## 2020-07-31 NOTE — Progress Notes (Signed)
Subjective:    Patient ID: Samuel Carney, male    DOB: [birth year], 75 y.o.   MRN: [id number] Chief Complaint  Patient presents with  . Follow-up    2 wk f/u femoral stent incision check     Samuel Carney is a 75 year old male that presents today for follow-up evaluation after intervention on 10/12/2020 including:  PROCEDURE: 1. Left common femoral and superficial femoral endarterectomy with Cormatrix patch angioplasty. 2. Left profunda femoris endarterectomy separate and distinct with a separate CorMatrix patch. 3. Open transluminal angioplasty and stent placement left SFA. 4. Open transluminal angioplasty and stent placement left posterior tibial 5. Introduction catheter into posterior tibial artery third order catheter placement with imaging as well as administration of intra-arterial nitroglycerin.  Since his intervention the patient notes that he does not have any rest pain, wounds or ulcerations or worsening claudication pain however he does note that he has swelling throughout his left lower extremity for approximately the groin area to the lower leg.  The incision itself is clean dry and intact with no evidence of dehiscence.  The patient notes that the longer he stands the more it swells and it is very uncomfortable.  Today noninvasive studies show focal areas of heterogeneous echogenicity with no internal flow noted anterior to the left groin/proximal thigh vasculature.  This is consistent with his recent left femoral endarterectomy.  The large focal area measures about 4.3 x 2.4 x 1.5 cm.  There is edematous tissue noted throughout the thigh area.  There is spontaneous flow and no evidence of thrombus in the common femoral vein and great saphenous vein.   Review of Systems  Cardiovascular: Positive for leg swelling.  All other systems reviewed and are negative.      Objective:   Physical Exam Vitals reviewed.  HENT:     Head: Normocephalic.  Cardiovascular:     Rate and  Rhythm: Normal rate.     Pulses: Normal pulses.  Pulmonary:     Effort: Pulmonary effort is normal.  Musculoskeletal:     Left lower leg: 3+ Edema present.  Neurological:     Mental Status: He is alert and oriented to person, place, and time.  Psychiatric:        Mood and Affect: Mood normal.        Behavior: Behavior normal.        Thought Content: Thought content normal.        Judgment: Judgment normal.     BP 108/66   Pulse 65   Ht 5\' 11"  (1.803 m)   Wt 163 lb (73.9 kg)   BMI 22.73 kg/m   Past Medical History:  Diagnosis Date  . Alcohol abuse   . Aortic atherosclerosis ([place])   . B12 deficiency   . Cirrhosis ([place])   . COPD (chronic obstructive pulmonary disease) ([place])   . Coronary artery disease   . GERD (gastroesophageal reflux disease)   . Glaucoma   . Grade II diastolic dysfunction   . History of kidney stones   . HLD (hyperlipidemia)   . Hx of radiation therapy   . Hypertension   . Lung mass   . Moderate mitral regurgitation   . Pneumonia   . PVD (peripheral vascular disease) ([place])   . Squamous cell carcinoma of lung, right ([place]) 2019    Social History   Socioeconomic History  . Marital status: Divorced    Spouse name: Not on file  . Number of children: Not on file  .  Years of education: Not on file  . Highest education level: Not on file  Occupational History  . Not on file  Tobacco Use  . Smoking status: Current Every Day Smoker    Packs/day: 1.00    Years: 50.00    Pack years: 50.00    Types: Cigarettes  . Smokeless tobacco: Never Used  Vaping Use  . Vaping Use: Never used  Substance and Sexual Activity  . Alcohol use: Yes    Comment: "1 pint of liquor per day"  . Drug use: Never  . Sexual activity: Yes  Other Topics Concern  . Not on file  Social History Narrative   Live in girl friend   Social Determinants of Radio broadcast assistant Strain: Not on file  Food Insecurity: Not on file  Transportation Needs: Not on file   Physical Activity: Not on file  Stress: Not on file  Social Connections: Not on file  Intimate Partner Violence: Not on file    Past Surgical History:  Procedure Laterality Date  . COLONOSCOPY WITH PROPOFOL    . COLONOSCOPY WITH PROPOFOL N/A 11/25/2018   Procedure: COLONOSCOPY WITH PROPOFOL;  Surgeon: Lollie Sails, MD;  Location[facility] ENDOSCOPY;  Service: Endoscopy;  Laterality: N/A;  . ENDARTERECTOMY FEMORAL Left 07/13/2020   Procedure: ENDARTERECTOMY FEMORAL ( SFA STENT);  Surgeon: Katha Cabal, MD;  Location: [facility] ORS;  Service: Vascular;  Laterality: Left;  . ESOPHAGOGASTRODUODENOSCOPY (EGD) WITH PROPOFOL N/A 11/25/2018   Procedure: ESOPHAGOGASTRODUODENOSCOPY (EGD) WITH PROPOFOL;  Surgeon: Lollie Sails, MD;  Location: [facility] ENDOSCOPY;  Service: Endoscopy;  Laterality: N/A;  . LOWER EXTREMITY ANGIOGRAPHY Left 06/07/2020   Procedure: LOWER EXTREMITY ANGIOGRAPHY;  Surgeon: Katha Cabal, MD;  Location: [employer] [facility] CV LAB;  Service: Cardiovascular;  Laterality: Left;    Family History  Problem Relation Age of Onset  . Stroke Brother   . Diabetes Mellitus II Brother   . Diabetes Mellitus II Mother   . Diabetes Mellitus II Maternal Uncle     No Known Allergies  CBC Latest Ref Rng & Units 07/13/2020 07/13/2020 07/11/2020  WBC 4.0 - 10.5 K/uL 9.7 6.4 5.9  Hemoglobin 13.0 - 17.0 g/dL 8.6(L) 8.9(L) 10.4(L)  Hematocrit 39.0 - 52.0 % 26.8(L) 28.6(L) 33.6(L)  Platelets 150 - 400 K/uL 92(L) 100(L) 137(L)      CMP     Component Value Date/Time   NA 140 07/13/2020 2346   NA 136 12/28/2012 0547   K 4.1 07/13/2020 2346   K 3.5 12/28/2012 0547   CL 114 (H) 07/13/2020 2346   CL 105 12/28/2012 0547   CO2 18 (L) 07/13/2020 2346   CO2 26 12/28/2012 0547   GLUCOSE 126 (H) 07/13/2020 2346   GLUCOSE 96 12/28/2012 0547   BUN 20 07/13/2020 2346   BUN 14 12/28/2012 0547   CREATININE 1.16 07/13/2020 2346   CREATININE 1.09 12/28/2012 0547   CALCIUM 8.2 (L) 07/13/2020  2346   CALCIUM 8.8 12/28/2012 0547   PROT 6.9 12/28/2012 0547   ALBUMIN 3.1 (L) 12/28/2012 0547   AST 32 12/28/2012 0547   ALT 16 12/28/2012 0547   ALKPHOS 91 12/28/2012 0547   BILITOT 1.0 12/28/2012 0547   GFRNONAA >60 07/13/2020 2346   GFRNONAA >60 12/28/2012 0547   GFRAA >60 12/28/2012 0547     VAS [place] ABI WITH/WO TBI  Result Date: 07/21/2020 LOWER EXTREMITY DOPPLER STUDY Indications: Peripheral artery disease. Other Factors: Left foot pain.  Vascular Interventions: 06/07/2020 PTA and stent of  LEIA.                         07/13/2020 left endart and stents in SFA,Pop, and PTA. Performing Technologist: Concha Norway RVT  Examination Guidelines: A complete evaluation includes at minimum, Doppler waveform signals and systolic blood pressure reading at the level of bilateral brachial, anterior tibial, and posterior tibial arteries, when vessel segments are accessible. Bilateral testing is considered an integral part of a complete examination. Photoelectric Plethysmograph (PPG) waveforms and toe systolic pressure readings are included as required and additional duplex testing as needed. Limited examinations for reoccurring indications may be performed as noted.  ABI Findings: +---------+------------------+-----+----------+--------+ Right    Rt Pressure (mmHg)IndexWaveform  Comment  +---------+------------------+-----+----------+--------+ Brachial 128                                       +---------+------------------+-----+----------+--------+ ATA      124               0.97 monophasic         +---------+------------------+-----+----------+--------+ PTA      120               0.94 biphasic           +---------+------------------+-----+----------+--------+ Great Toe116               0.91 Normal             +---------+------------------+-----+----------+--------+ +---------+------------------+-----+----------+-------+ Left     Lt Pressure (mmHg)IndexWaveform  Comment  +---------+------------------+-----+----------+-------+ ATA      124               0.97 monophasic        +---------+------------------+-----+----------+-------+ PTA      155               1.21 biphasic          +---------+------------------+-----+----------+-------+ Great Toe                       Normal            +---------+------------------+-----+----------+-------+ +-------+-----------+-----------+------------+------------+ ABI/TBIToday's ABIToday's TBIPrevious ABIPrevious TBI +-------+-----------+-----------+------------+------------+ Right  .97        .91        .90         .81          +-------+-----------+-----------+------------+------------+ Left   1.21       .93        .67         .19          +-------+-----------+-----------+------------+------------+ Left ABIs and TBIs appear increased compared to prior study on 06/13/2020.  Summary: Right: Resting right ankle-brachial index is within normal range. No evidence of significant right lower extremity arterial disease. The right toe-brachial index is normal. Left: Resting left ankle-brachial index is within normal range. No evidence of significant left lower extremity arterial disease. The left toe-brachial index is normal.  *See table(s) above for measurements and observations.  Electronically signed by [name] MD on 07/21/2020 at 5:10:23 PM.    Final    ABI WITH/WO TBI  Result Date: 06/13/2020 LOWER EXTREMITY DOPPLER STUDY Indications: Peripheral artery disease. Other Factors: Left foot pain.  Vascular Interventions: 06/07/2020 PTA and stent of LEIA. Comparison Study: 05/19/2020 Performing Technologist: Charlane Ferretti RT (R)(VS)  Examination Guidelines: A complete evaluation includes at minimum, Doppler  waveform signals and systolic blood pressure reading at the level of bilateral brachial, anterior tibial, and posterior tibial arteries, when vessel segments are accessible. Bilateral testing is considered an  integral part of a complete examination. Photoelectric Plethysmograph (PPG) waveforms and toe systolic pressure readings are included as required and additional duplex testing as needed. Limited examinations for reoccurring indications may be performed as noted.  ABI Findings: +---------+------------------+-----+----------+--------+ Right    Rt Pressure (mmHg)IndexWaveform  Comment  +---------+------------------+-----+----------+--------+ Brachial 130                                       +---------+------------------+-----+----------+--------+ ATA      105               0.81 monophasic         +---------+------------------+-----+----------+--------+ PTA      117               0.90 monophasic         +---------+------------------+-----+----------+--------+ Great Toe105               0.81 Abnormal           +---------+------------------+-----+----------+--------+ +---------+------------------+-----+----------+-------+ Left     Lt Pressure (mmHg)IndexWaveform  Comment +---------+------------------+-----+----------+-------+ Brachial 129                                      +---------+------------------+-----+----------+-------+ ATA      78                0.60 monophasic        +---------+------------------+-----+----------+-------+ PTA      87                0.67 monophasic        +---------+------------------+-----+----------+-------+ Great Toe25                0.19 Abnormal          +---------+------------------+-----+----------+-------+ +-------+-----------+-----------+------------+------------+ ABI/TBIToday's ABIToday's TBIPrevious ABIPrevious TBI +-------+-----------+-----------+------------+------------+ Right  .90        .81        .83         .76          +-------+-----------+-----------+------------+------------+ Left   .67        .19        .73         .30          +-------+-----------+-----------+------------+------------+  Bilateral ABIs appear essentially unchanged compared to prior study on 05/19/2020. Bilateral TBIs appear essentially unchanged compared to prior study on 05/19/2020.  Summary: Right: Resting right ankle-brachial index indicates mild right lower extremity arterial disease. The right toe-brachial index is normal. Left: Resting left ankle-brachial index indicates moderate left lower extremity arterial disease. The left toe-brachial index is abnormal. *See table(s) above for measurements and observations.  Electronically signed by [name] MD on 06/13/2020 at 12:42:19 PM.   Final        Assessment & Plan:   1. Atherosclerosis of artery of extremity with ulceration ([place]) The patient notes that he is not having worsening claudication-like symptoms however the swelling is giving him discomfort.  Noninvasive studies today do show there is a left groin/proximal thigh area of heterogeneous echogenicity with no internal flow.  It measures roughly 4.3 x 2.3 x 1.5 cm.  No evidence of DVT  is seen.  Because there is no flow seen within patient is advised to utilize warm compresses to help with decreasing swelling.  It is noted that they may see some bruising.  He should also utilize a compression sock to help with edema as well.  Elevation is also advised.  We will have the patient return in 1 month to evaluate progression with edema. 2. Hyperlipidemia, mixed Continue statin as ordered and reviewed, no changes at this time   3. Benign essential hypertension Continue antihypertensive medications as already ordered, these medications have been reviewed and there are no changes at this time.   4. Tobacco abuse Smoking cessation was discussed, 3-10 minutes spent on this topic specifically    Current Outpatient Medications on File Prior to Visit  Medication Sig Dispense Refill  . acetaminophen (TYLENOL) 500 MG tablet Take 1,000 mg by mouth every 6 (six) hours as needed.    Marland Kitchen amLODipine (NORVASC) 10 MG tablet  Take 1 tablet by mouth daily.    Marland Kitchen aspirin EC 81 MG EC tablet Take 1 tablet (81 mg total) by mouth daily at 6 (six) AM. Swallow whole. 90 tablet 3  . clopidogrel (PLAVIX) 75 MG tablet Take 1 tablet (75 mg total) by mouth daily. 30 tablet 11  . cyanocobalamin (,VITAMIN B-12,) 1000 MCG/ML injection Inject 1,000 mcg into the muscle every 30 (thirty) days.    . dorzolamide-timolol (COSOPT) 22.3-6.8 MG/ML ophthalmic solution Place 1 drop into both eyes 2 (two) times daily.  6  . doxazosin (CARDURA) 2 MG tablet Take 2 mg by mouth daily.  1  . HYDROcodone-acetaminophen (NORCO/VICODIN) 5-325 MG tablet Take 1-2 tablets by mouth every 6 (six) hours as needed for moderate pain or severe pain. 50 tablet 0  . metoprolol tartrate (LOPRESSOR) 100 MG tablet Take 1 tablet by mouth 2 (two) times daily.    . pantoprazole (PROTONIX) 40 MG tablet Take 40 mg by mouth daily.    . potassium chloride SA (KLOR-CON) 20 MEQ tablet Take 1 tablet by mouth daily.    . rosuvastatin (CRESTOR) 5 MG tablet Take 1 tablet by mouth once daily 90 tablet 3  . Travoprost, BAK Free, (TRAVATAN) 0.004 % SOLN ophthalmic solution Place 1 drop into both eyes at bedtime.    Marland Kitchen telmisartan (MICARDIS) 80 MG tablet Take 1 tablet by mouth daily.     No current facility-administered medications on file prior to visit.    There are no Patient Instructions on file for this visit. No follow-ups on file.   Kris Hartmann, NP

## 2020-08-02 ENCOUNTER — Telehealth (INDEPENDENT_AMBULATORY_CARE_PROVIDER_SITE_OTHER): Payer: Self-pay

## 2020-08-02 ENCOUNTER — Other Ambulatory Visit (INDEPENDENT_AMBULATORY_CARE_PROVIDER_SITE_OTHER): Payer: Self-pay | Admitting: Nurse Practitioner

## 2020-08-02 MED ORDER — HYDROCODONE-ACETAMINOPHEN 5-325 MG PO TABS: 1.0000 | ORAL_TABLET | Freq: Four times a day (QID) | ORAL | 0 refills | Status: DC | PRN

## 2020-08-02 NOTE — Telephone Encounter (Signed)
sent 

## 2020-08-02 NOTE — Telephone Encounter (Signed)
Patient has been made aware.

## 2020-08-10 ENCOUNTER — Other Ambulatory Visit (INDEPENDENT_AMBULATORY_CARE_PROVIDER_SITE_OTHER): Payer: Self-pay | Admitting: Nurse Practitioner

## 2020-08-10 ENCOUNTER — Telehealth (INDEPENDENT_AMBULATORY_CARE_PROVIDER_SITE_OTHER): Payer: Self-pay

## 2020-08-10 MED ORDER — HYDROCODONE-ACETAMINOPHEN 5-325 MG PO TABS: 1.0000 | ORAL_TABLET | Freq: Four times a day (QID) | ORAL | 0 refills | Status: DC | PRN

## 2020-08-10 NOTE — Telephone Encounter (Signed)
He can take 2 tablets every six hours if he needs it.  If he doesn't have severe pain, then he can try not taking it or taking one instead of two.  He should continue to elevate his leg to help with swelling.  But overall no, that is not too much.  We will send in refill but due to the timing he will not be able pick it up until Friday.

## 2020-08-10 NOTE — Telephone Encounter (Signed)
Patient was made aware with medical recommendations and verbalized understanding 

## 2020-08-23 ENCOUNTER — Ambulatory Visit: Payer: Medicare Other | Admitting: Cardiovascular Disease

## 2020-08-25 ENCOUNTER — Other Ambulatory Visit: Payer: Self-pay

## 2020-08-25 ENCOUNTER — Encounter (INDEPENDENT_AMBULATORY_CARE_PROVIDER_SITE_OTHER): Payer: Self-pay | Admitting: Vascular Surgery

## 2020-08-25 ENCOUNTER — Ambulatory Visit (INDEPENDENT_AMBULATORY_CARE_PROVIDER_SITE_OTHER): Payer: Medicare Other | Admitting: Vascular Surgery

## 2020-08-25 VITALS — BP 127/68 | HR 60 | Resp 16 | Wt 152.6 lb

## 2020-08-25 DIAGNOSIS — I7025 Atherosclerosis of native arteries of other extremities with ulceration: Secondary | ICD-10-CM

## 2020-08-25 MED ORDER — HYDROCODONE-ACETAMINOPHEN 5-325 MG PO TABS: 1.0000 | ORAL_TABLET | Freq: Four times a day (QID) | ORAL | 0 refills | Status: DC | PRN

## 2020-08-25 MED ORDER — GABAPENTIN 300 MG PO CAPS: 300.0000 mg | ORAL_CAPSULE | Freq: Every day | ORAL | 3 refills | Status: DC

## 2020-08-25 NOTE — Progress Notes (Signed)
Patient ID: Samuel Carney, male   DOB: 15-Oct-1945, 75 y.o.   MRN: [id number]  No chief complaint on file.   HPI Samuel Carney is a 75 y.o. male.     The patient returns to the office for followup and review status post endarterectomy angiogram with intervention.   Procedure 07/13/2020: 1. Left common femoral and superficial femoral endarterectomy with Cormatrix patch angioplasty. 2. Left profunda femoris endarterectomy separate and distinct with a separate CorMatrix patch. 3. Open transluminal angioplasty and stent placement left SFA. 4. Open transluminal angioplasty and stent placement left posterior tibial  The patient notes his leg still swells by the end of the day and he is complaining of neuropathic type pains from his knee radiating down to his heel.  No new ulcers or wounds have occurred since the last visit.  There have been no significant changes to the patient's overall health care.    Past Medical History:  Diagnosis Date  . Alcohol abuse   . Aortic atherosclerosis ([place])   . B12 deficiency   . Cirrhosis ([place])   . COPD (chronic obstructive pulmonary disease) ([place])   . Coronary artery disease   . GERD (gastroesophageal reflux disease)   . Glaucoma   . Grade II diastolic dysfunction   . History of kidney stones   . HLD (hyperlipidemia)   . Hx of radiation therapy   . Hypertension   . Lung mass   . Moderate mitral regurgitation   . Pneumonia   . PVD (peripheral vascular disease) ([place])   . Squamous cell carcinoma of lung, right ([place]) 2019    Past Surgical History:  Procedure Laterality Date  . COLONOSCOPY WITH PROPOFOL    . COLONOSCOPY WITH PROPOFOL N/A 11/25/2018   Procedure: COLONOSCOPY WITH PROPOFOL;  Surgeon: Lollie Sails, MD;  Location: [facility] ENDOSCOPY;  Service: Endoscopy;  Laterality: N/A;  . ENDARTERECTOMY FEMORAL Left 07/13/2020   Procedure: ENDARTERECTOMY FEMORAL ( SFA STENT);  Surgeon: Katha Cabal, MD;  Location: [facility] ORS;  Service:  Vascular;  Laterality: Left;  . ESOPHAGOGASTRODUODENOSCOPY (EGD) WITH PROPOFOL N/A 11/25/2018   Procedure: ESOPHAGOGASTRODUODENOSCOPY (EGD) WITH PROPOFOL;  Surgeon: Lollie Sails, MD;  Location: [facility] ENDOSCOPY;  Service: Endoscopy;  Laterality: N/A;  . LOWER EXTREMITY ANGIOGRAPHY Left 06/07/2020   Procedure: LOWER EXTREMITY ANGIOGRAPHY;  Surgeon: Katha Cabal, MD;  Location: [place] CV LAB;  Service: Cardiovascular;  Laterality: Left;      No Known Allergies  Current Outpatient Medications  Medication Sig Dispense Refill  . acetaminophen (TYLENOL) 500 MG tablet Take 1,000 mg by mouth every 6 (six) hours as needed.    Marland Kitchen amLODipine (NORVASC) 10 MG tablet Take 1 tablet by mouth daily.    Marland Kitchen aspirin EC 81 MG EC tablet Take 1 tablet (81 mg total) by mouth daily at 6 (six) AM. Swallow whole. 90 tablet 3  . clopidogrel (PLAVIX) 75 MG tablet Take 1 tablet (75 mg total) by mouth daily. 30 tablet 11  . cyanocobalamin (,VITAMIN B-12,) 1000 MCG/ML injection Inject 1,000 mcg into the muscle every 30 (thirty) days.    . dorzolamide-timolol (COSOPT) 22.3-6.8 MG/ML ophthalmic solution Place 1 drop into both eyes 2 (two) times daily.  6  . doxazosin (CARDURA) 2 MG tablet Take 2 mg by mouth daily.  1  . HYDROcodone-acetaminophen (NORCO/VICODIN) 5-325 MG tablet Take 1-2 tablets by mouth every 6 (six) hours as needed for moderate pain or severe pain. 50 tablet 0  . metoprolol tartrate (LOPRESSOR)  100 MG tablet Take 1 tablet by mouth 2 (two) times daily.    . pantoprazole (PROTONIX) 40 MG tablet Take 40 mg by mouth daily.    . potassium chloride SA (KLOR-CON) 20 MEQ tablet Take 1 tablet by mouth daily.    . rosuvastatin (CRESTOR) 5 MG tablet Take 1 tablet by mouth once daily 90 tablet 3  . telmisartan (MICARDIS) 80 MG tablet Take 1 tablet by mouth daily.    . Travoprost, BAK Free, (TRAVATAN) 0.004 % SOLN ophthalmic solution Place 1 drop into both eyes at bedtime.     No current  facility-administered medications for this visit.        Physical Exam There were no vitals taken for this visit. Gen:  WD/WN, NAD Skin: incision C/D/I; he has a 2+ palpable posterior tibial pulse and a 1+ palpable popliteal pulse there is no significant edema on exam today     Assessment/Plan: 1.  Atherosclerotic occlusive disease with rest pain: Patient's rest pain symptoms have resolved but he is now describing neuropathic pain I am suspicious this represents pain secondary to lumbar sacral degenerative disease or hip and knee degenerative changes.  I will start a dose of Neurontin in the evening as this is the time that he is most frustrated.  He is asked for a prescription for his pain pills and I have informed him he can have 1 more refill.  But that he will be off his narcotics after that.  I have recommended he use NSAIDs and Tylenol.  He will follow-up with me in 2 months with a duplex ultrasound of the left lower extremity as well as ABIs  - VAS [place] LOWER EXTREMITY ARTERIAL DUPLEX; Future - VAS [place] ABI WITH/WO TBI; Future     [name] 08/25/2020, 9:55 AM   This note was created with Dragon medical transcription system.  Any errors from dictation are unintentional.

## 2020-10-19 NOTE — Progress Notes (Signed)
MRN : [id number]  Samuel Carney is a 75 y.o. (November 30, 1945) male who presents with chief complaint of No chief complaint on file. Marland Kitchen  History of Present Illness:   The patient returns to the office for followup and review status post endarterectomy angiogram with intervention.   Procedure 07/13/2020: Left common femoral and superficial femoral endarterectomy with Cormatrix patch angioplasty. Left profunda femoris endarterectomy separate and distinct with a separate CorMatrix patch. Open transluminal angioplasty and stent placement left SFA. Open transluminal angioplasty and stent placement left posterior tibial   The patient notes his leg still swells by the end of the day and he is still complaining of  pain but it is better with Tylenol and some added Motrin.  No new ulcers or wounds have occurred since the last visit.   There have been no significant changes to the patient's overall health care.  ABI's Rt=0.94 and Lt=1.04 (previous ABI's Rt=0.97 and Lt=1.21) Duplex ultrasound of the left lower extremity shows the endarterectomy and the SFA stent are patent.  No outpatient medications have been marked as taking for the 10/20/20 encounter (Appointment) with Delana Meyer, Dolores Lory, MD.    Past Medical History:  Diagnosis Date   Alcohol abuse    Aortic atherosclerosis ([place])    B12 deficiency    Cirrhosis ([place])    COPD (chronic obstructive pulmonary disease) ([place])    Coronary artery disease    GERD (gastroesophageal reflux disease)    Glaucoma    Grade II diastolic dysfunction    History of kidney stones    HLD (hyperlipidemia)    Hx of radiation therapy    Hypertension    Lung mass    Moderate mitral regurgitation    Pneumonia    PVD (peripheral vascular disease) (HCC)    Squamous cell carcinoma of lung, right ([place]) 2019    Past Surgical History:  Procedure Laterality Date   COLONOSCOPY WITH PROPOFOL     COLONOSCOPY WITH PROPOFOL N/A 11/25/2018   Procedure: COLONOSCOPY  WITH PROPOFOL;  Surgeon: Lollie Sails, MD;  Location: [facility] ENDOSCOPY;  Service: Endoscopy;  Laterality: N/A;   ENDARTERECTOMY FEMORAL Left 07/13/2020   Procedure: ENDARTERECTOMY FEMORAL ( SFA STENT);  Surgeon: Katha Cabal, MD;  Location: [facility] ORS;  Service: Vascular;  Laterality: Left;   ESOPHAGOGASTRODUODENOSCOPY (EGD) WITH PROPOFOL N/A 11/25/2018   Procedure: ESOPHAGOGASTRODUODENOSCOPY (EGD) WITH PROPOFOL;  Surgeon: Lollie Sails, MD;  Location[facility] ENDOSCOPY;  Service: Endoscopy;  Laterality: N/A;   LOWER EXTREMITY ANGIOGRAPHY Left 06/07/2020   Procedure: LOWER EXTREMITY ANGIOGRAPHY;  Surgeon: Katha Cabal, MD;  Location: [place] CV LAB;  Service: Cardiovascular;  Laterality: Left;    Social History Social History   Tobacco Use   Smoking status: Every Day    Packs/day: 1.00    Years: 50.00    Pack years: 50.00    Types: Cigarettes   Smokeless tobacco: Never  Vaping Use   Vaping Use: Never used  Substance Use Topics   Alcohol use: Yes    Comment: "1 pint of liquor per day"   Drug use: Never    Family History Family History  Problem Relation Age of Onset   Stroke Brother    Diabetes Mellitus II Brother    Diabetes Mellitus II Mother    Diabetes Mellitus II Maternal Uncle     No Known Allergies   REVIEW OF SYSTEMS (Negative unless checked)  Constitutional: [] Weight loss  [] Fever  [] Chills Cardiac: [] Chest pain   [] Chest  pressure   [] Palpitations   [] Shortness of breath when laying flat   [] Shortness of breath with exertion. Vascular:  [] Pain in legs with walking   [] Pain in legs at rest  [] History of DVT   [] Phlebitis   [] Swelling in legs   [] Varicose veins   [] Non-healing ulcers Pulmonary:   [] Uses home oxygen   [] Productive cough   [] Hemoptysis   [] Wheeze  [] COPD   [] Asthma Neurologic:  [] Dizziness   [] Seizures   [] History of stroke   [] History of TIA  [] Aphasia   [] Vissual changes   [] Weakness or numbness in arm   [] Weakness or numbness in  leg Musculoskeletal:   [] Joint swelling   [] Joint pain   [] Low back pain Hematologic:  [] Easy bruising  [] Easy bleeding   [] Hypercoagulable state   [] Anemic Gastrointestinal:  [] Diarrhea   [] Vomiting  [] Gastroesophageal reflux/heartburn   [] Difficulty swallowing. Genitourinary:  [] Chronic kidney disease   [] Difficult urination  [] Frequent urination   [] Blood in urine Skin:  [] Rashes   [] Ulcers  Psychological:  [] History of anxiety   []  History of major depression.  Physical Examination  There were no vitals filed for this visit. There is no height or weight on file to calculate BMI. Gen: WD/WN, NAD Head: [place]/AT, No temporalis wasting.  Ear/Nose/Throat: Hearing grossly intact, nares w/o erythema or drainage Eyes: PER, EOMI, sclera nonicteric.  Neck: Supple, no large masses.   Pulmonary:  Good air movement, no audible wheezing bilaterally, no use of accessory muscles.  Cardiac: RRR, no JVD Vascular:  scattered varicosities present bilaterally.  Moderate venous stasis changes to the legs bilaterally.  1+ hard non-pitting edema  Vessel Right Left  Radial Palpable Palpable  PT Not Palpable 1+ Palpable  DP Not Palpable Not Palpable  Gastrointestinal: Non-distended. No guarding/no peritoneal signs.  Musculoskeletal: M/S 5/5 throughout.  No deformity or atrophy.  Neurologic: CN 2-12 intact. Symmetrical.  Speech is fluent. Motor exam as listed above. Psychiatric: Judgment intact, Mood & affect appropriate for pt's clinical situation. Dermatologic: Moderate rashes no ulcers noted.  No changes consistent with cellulitis. Lymph : No lichenification or skin changes of chronic lymphedema.  CBC Lab Results  Component Value Date   WBC 9.7 07/13/2020   HGB 8.6 (L) 07/13/2020   HCT 26.8 (L) 07/13/2020   MCV 90.5 07/13/2020   PLT 92 (L) 07/13/2020    BMET    Component Value Date/Time   NA 140 07/13/2020 2346   NA 136 12/28/2012 0547   K 4.1 07/13/2020 2346   K 3.5 12/28/2012 0547   CL 114  (H) 07/13/2020 2346   CL 105 12/28/2012 0547   CO2 18 (L) 07/13/2020 2346   CO2 26 12/28/2012 0547   GLUCOSE 126 (H) 07/13/2020 2346   GLUCOSE 96 12/28/2012 0547   BUN 20 07/13/2020 2346   BUN 14 12/28/2012 0547   CREATININE 1.16 07/13/2020 2346   CREATININE 1.09 12/28/2012 0547   CALCIUM 8.2 (L) 07/13/2020 2346   CALCIUM 8.8 12/28/2012 0547   GFRNONAA >60 07/13/2020 2346   GFRNONAA >60 12/28/2012 0547   GFRAA >60 12/28/2012 0547   CrCl cannot be calculated (Patient's most recent lab result is older than the maximum 21 days allowed.).  COAG Lab Results  Component Value Date   INR 1.1 07/11/2020   INR 0.92 10/14/2017   INR 0.9 12/26/2012    Radiology No results found.   Assessment/Plan 1. Atherosclerosis of native arteries of the extremities with ulceration ([place]) Recommend:  The patient is status  post successful left femoral endarterectomy with intervention.  The patient reports that the claudication symptoms and leg pain is essentially gone.   The patient denies lifestyle limiting changes at this point in time.  No further invasive studies, angiography or surgery at this time The patient should continue walking and begin a more formal exercise program.  The patient should continue antiplatelet therapy and aggressive treatment of the lipid abnormalities  The patient should continue wearing graduated compression socks 10-15 mmHg strength to control the mild edema.  Patient should undergo noninvasive studies as ordered. The patient will follow up with me after the studies.    - VAS [place] ABI WITH/WO TBI; Future - VAS [place] LOWER EXTREMITY ARTERIAL DUPLEX; Future  2. Coronary artery disease of native artery of native heart with stable angina pectoris (HCC) Continue cardiac and antihypertensive medications as already ordered and reviewed, no changes at this time.  Continue statin as ordered and reviewed, no changes at this time  Nitrates PRN for chest pain   3. Benign  essential hypertension Continue antihypertensive medications as already ordered, these medications have been reviewed and there are no changes at this time.   4. Hyperlipidemia, mixed Continue statin as ordered and reviewed, no changes at this time     [name], MD  10/19/2020 3:48 PM

## 2020-10-20 ENCOUNTER — Ambulatory Visit (INDEPENDENT_AMBULATORY_CARE_PROVIDER_SITE_OTHER): Payer: Medicare Other

## 2020-10-20 ENCOUNTER — Encounter (INDEPENDENT_AMBULATORY_CARE_PROVIDER_SITE_OTHER): Payer: Self-pay | Admitting: Vascular Surgery

## 2020-10-20 ENCOUNTER — Ambulatory Visit (INDEPENDENT_AMBULATORY_CARE_PROVIDER_SITE_OTHER): Payer: Medicare Other | Admitting: Vascular Surgery

## 2020-10-20 ENCOUNTER — Other Ambulatory Visit: Payer: Self-pay

## 2020-10-20 VITALS — BP 161/64 | HR 62 | Ht 71.0 in | Wt 149.0 lb

## 2020-10-20 DIAGNOSIS — I25118 Atherosclerotic heart disease of native coronary artery with other forms of angina pectoris: Secondary | ICD-10-CM | POA: Diagnosis not present

## 2020-10-20 DIAGNOSIS — E782 Mixed hyperlipidemia: Secondary | ICD-10-CM | POA: Diagnosis not present

## 2020-10-20 DIAGNOSIS — I7025 Atherosclerosis of native arteries of other extremities with ulceration: Secondary | ICD-10-CM | POA: Diagnosis not present

## 2020-10-20 DIAGNOSIS — I10 Essential (primary) hypertension: Secondary | ICD-10-CM

## 2020-12-12 NOTE — Progress Notes (Signed)
Cardiology Office Note  Date:  12/13/2020   ID:  Gottlieb, Zuercher 03-15-1946, MRN [id number]  PCP:  Juluis Pitch, MD   Chief Complaint  Patient presents with   6 month follow up     "Doing well." Medications reviewed by the patient verbally.     HPI:  Samuel Carney is a 75 y.o. male hypertension  lifelong smoker, 50-pack-year smoking history Alcohol, with cirrhosis, followed by GI COPD, active smoker CT scan of the chest  right upper lobe mass.  PET scan  revealed a 2.5 cm right upper lobe mass which was spiculated hypermetabolic concerning for primary bronchogenic carcinoma -Stress test July 2019 PAD He presents for follow-up of his shortness of breath and coronary atherosclerosis on CT scan  Myoview 05/2020: Low risk  He denies chest pain concerning for angina Underwent lower extremity endarterectomy and stent placement with Dr. Delana Meyer, procedure went well and he is recovered Still some numbness in the left leg at times  Had anemia in the hospital hemoglobin in the 8 range, required blood transfusion x1, no CBC since that time On aspirin Plavix  ABI's Rt=0.94 and Lt=1.04 (previous ABI's Rt=0.97 and Lt=1.21) Duplex ultrasound of the left lower extremity shows the endarterectomy and the SFA stent are patent.  CT 01/2019 Stable appearance of post radiation fibrosis and consolidation of the anterior right lung apex, which almost completely obscures a nodule more discretely appreciated on prior examinations, measuring approximately 1.3 x 1.1 cm (series 3, image 31). 2. Stable prominent pretracheal and mediastinal lymph nodes  3.  Emphysema (ICD10-J43.9). 4. Coronary artery disease. Severe mixed aortic atherosclerosis. Aortic Atherosclerosis (ICD10-I70.0).  Lab work reviewed  total chol 123, LDL 48  EKG personally reviewed by myself on todays visit NSR rate 57 nonspecific T wave abnormality  Other past medical history reviewed Stress test July 2019 no  ischemia  Echocardiogram December 2020 normal ejection fraction mild LVH grade 2 diastolic dysfunction moderate MR moderately dilated left atrium mild to moderately elevated right heart pressures  Followed by GI for 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 Faith Rogue for consultation of his possible lung cancer Felt to be stage I carcinoma the lung based on CT scan and PET scan Biopsy performed 10/13/2017  CT scan with extensive coronary atherosclerosis Mild diffuse carotid disease aortic disease Moderate to heavy coronary calcification all 3 vessels  Last chest CT scan October 2020, followed by oncology Stable appearance of post radiation fibrosis and consolidation anterior right lung apex, appears nodule  PMH:   has a past medical history of Alcohol abuse, Aortic atherosclerosis ([place]), B12 deficiency, Cirrhosis ([place]), COPD (chronic obstructive pulmonary disease) ([place]), Coronary artery disease, GERD (gastroesophageal reflux disease), Glaucoma, Grade II diastolic dysfunction, History of kidney stones, HLD (hyperlipidemia), radiation therapy, Hypertension, Lung mass, Moderate mitral regurgitation, Pneumonia, PVD (peripheral vascular disease) ([place]), and Squamous cell carcinoma of lung, right ([place]) (2019).smoker, cOPD, coronary artery disease  PSH:    Past Surgical History:  Procedure Laterality Date   COLONOSCOPY WITH PROPOFOL     COLONOSCOPY WITH PROPOFOL N/A 11/25/2018   Procedure: COLONOSCOPY WITH PROPOFOL;  Surgeon: Lollie Sails, MD;  Location: [facility] ENDOSCOPY;  Service: Endoscopy;  Laterality: N/A;   ENDARTERECTOMY FEMORAL Left 07/13/2020   Procedure: ENDARTERECTOMY FEMORAL ( SFA STENT);  Surgeon: Katha Cabal, MD;  Location: [facility] ORS;  Service: Vascular;  Laterality: Left;   ESOPHAGOGASTRODUODENOSCOPY  (EGD) WITH PROPOFOL N/A 11/25/2018   Procedure: ESOPHAGOGASTRODUODENOSCOPY (EGD) WITH PROPOFOL;  Surgeon: Lollie Sails, MD;  Location: [facility] ENDOSCOPY;  Service: Endoscopy;  Laterality: N/A;   LOWER EXTREMITY ANGIOGRAPHY Left 06/07/2020   Procedure: LOWER EXTREMITY ANGIOGRAPHY;  Surgeon: Katha Cabal, MD;  Location: [place] CV LAB;  Service: Cardiovascular;  Laterality: Left;    Current Outpatient Medications  Medication Sig Dispense Refill   acetaminophen (TYLENOL) 500 MG tablet Take 1,000 mg by mouth every 6 (six) hours as needed.     amLODipine (NORVASC) 10 MG tablet Take 1 tablet by mouth daily.     aspirin EC 81 MG EC tablet Take 1 tablet (81 mg total) by mouth daily at 6 (six) AM. Swallow whole. 90 tablet 3   clopidogrel (PLAVIX) 75 MG tablet Take 1 tablet (75 mg total) by mouth daily. 30 tablet 11   cyanocobalamin (,VITAMIN B-12,) 1000 MCG/ML injection Inject 1,000 mcg into the muscle every 30 (thirty) days.     dorzolamide-timolol (COSOPT) 22.3-6.8 MG/ML ophthalmic solution Place 1 drop into both eyes 2 (two) times daily.  6   doxazosin (CARDURA) 2 MG tablet Take 2 mg by mouth daily.  1   gabapentin (NEURONTIN) 300 MG capsule Take 1 capsule (300 mg total) by mouth at bedtime. 30 capsule 3   HYDROcodone-acetaminophen (NORCO) 5-325 MG tablet Take 1 tablet by mouth every 6 (six) hours as needed for moderate pain. 30 tablet 0   metoprolol tartrate (LOPRESSOR) 100 MG tablet Take 1 tablet by mouth 2 (two) times daily.     pantoprazole (PROTONIX) 40 MG tablet Take 40 mg by mouth daily.     potassium chloride SA (KLOR-CON) 20 MEQ tablet Take 1 tablet by mouth daily.     rosuvastatin (CRESTOR) 5 MG tablet Take 1 tablet by mouth once daily 90 tablet 3   Travoprost, BAK Free, (TRAVATAN) 0.004 % SOLN ophthalmic solution Place 1 drop into both eyes at bedtime.     telmisartan (MICARDIS) 80 MG tablet Take 1 tablet by mouth daily.     No current facility-administered medications for  this visit.     Allergies:   Patient has no known allergies.   Social History:  The patient  reports that he has been smoking cigarettes. He has a 50.00 pack-year smoking history. He has never used smokeless tobacco. He reports current alcohol use. He reports that he does not use drugs.   Family History:   family history includes Diabetes Mellitus II in his brother, maternal uncle, and mother; Stroke in his brother.    Review of Systems: Review of Systems  Constitutional: Negative.   HENT: Negative.    Respiratory:  Positive for shortness of breath.   Cardiovascular: Negative.   Gastrointestinal: Negative.   Musculoskeletal: Negative.   Neurological: Negative.   Psychiatric/Behavioral: Negative.    All other systems reviewed and are negative.   PHYSICAL EXAM: VS:  BP 132/60 (BP Location: Left Arm, Patient Position: Sitting, Cuff Size: Normal)   Pulse (!) 57   Ht 5\' 11"  (1.803 m)   Wt 154 lb (69.9 kg)   SpO2 99%   BMI 21.48 kg/m  , BMI Body mass index is 21.48 kg/m. Constitutional:  oriented to person, place, and time. No distress.  HENT:  Head: Grossly normal Eyes:  no discharge. No scleral icterus.  Neck: No JVD, no carotid bruits  Cardiovascular: Regular rate and rhythm, no murmurs appreciated Pulmonary/Chest: Clear  to auscultation bilaterally, no wheezes or rails Abdominal: Soft.  no distension.  no tenderness.  Musculoskeletal: Normal range of motion Neurological:  normal muscle tone. Coordination normal. No atrophy Skin: Skin warm and dry Psychiatric: normal affect, pleasant  Recent Labs: 07/13/2020: BUN 20; Creatinine, Ser 1.16; Hemoglobin 8.6; Platelets 92; Potassium 4.1; Sodium 140    Lipid Panel No results found for: CHOL, HDL, LDLCALC, TRIG    Wt Readings from Last 3 Encounters:  12/13/20 154 lb (69.9 kg)  10/20/20 149 lb (67.6 kg)  08/25/20 152 lb 9.6 oz (69.2 kg)      ASSESSMENT AND PLAN:  Malignant 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  [facility]) Completed radiation, followed by oncology Last CT scan end of 2020 Followed by Dr. Leron Croak with stable angina Heavy coronary calcification all 3 vessels as well as diffuse aortic atherosclerosis Smoking cessation recommended Completed Myoview March 2020 to low risk study  Tobacco abuse Cessation recommended,   no desire to quit Daughter present today, she will continue to push him  Benign essential hypertension Blood pressure is well controlled on today's visit. No changes made to the medications.  Hyperlipidemia Continue Crestor 5 milligrams daily Cholesterol at goal  Alcohol abuse Alcohol cessation recommended    Total encounter time more than 25 minutes  Greater than 50% was spent in counseling and coordination of care with the patient    Orders Placed This Encounter  Procedures   CBC   Basic metabolic panel   EKG 57-WIOM      Signed, Esmond Plants, M.D., Ph.D. 12/13/2020  [place], [place]

## 2020-12-13 ENCOUNTER — Encounter: Payer: Self-pay | Admitting: Cardiovascular Disease

## 2020-12-13 ENCOUNTER — Other Ambulatory Visit: Payer: Self-pay

## 2020-12-13 ENCOUNTER — Ambulatory Visit: Payer: Medicare Other | Admitting: Cardiovascular Disease

## 2020-12-13 VITALS — BP 132/60 | HR 57 | Ht 71.0 in | Wt 154.0 lb

## 2020-12-13 DIAGNOSIS — R0602 Shortness of breath: Secondary | ICD-10-CM | POA: Diagnosis not present

## 2020-12-13 DIAGNOSIS — I10 Essential (primary) hypertension: Secondary | ICD-10-CM | POA: Diagnosis not present

## 2020-12-13 DIAGNOSIS — I25118 Atherosclerotic heart disease of native coronary artery with other forms of angina pectoris: Secondary | ICD-10-CM | POA: Diagnosis not present

## 2020-12-13 DIAGNOSIS — E782 Mixed hyperlipidemia: Secondary | ICD-10-CM | POA: Diagnosis not present

## 2020-12-13 DIAGNOSIS — Z720 Tobacco use: Secondary | ICD-10-CM

## 2020-12-13 DIAGNOSIS — F1010 Alcohol abuse, uncomplicated: Secondary | ICD-10-CM

## 2020-12-13 MED ORDER — ROSUVASTATIN CALCIUM 5 MG PO TABS: 5.0000 mg | ORAL_TABLET | Freq: Every day | ORAL | 3 refills | Status: DC

## 2020-12-13 MED ORDER — CLOPIDOGREL BISULFATE 75 MG PO TABS: 75.0000 mg | ORAL_TABLET | Freq: Every day | ORAL | 11 refills | Status: DC

## 2020-12-13 MED ORDER — METOPROLOL TARTRATE 100 MG PO TABS: 100.0000 mg | ORAL_TABLET | Freq: Two times a day (BID) | ORAL | 3 refills | Status: DC

## 2020-12-13 MED ORDER — DOXAZOSIN MESYLATE 2 MG PO TABS
2.0000 mg | ORAL_TABLET | Freq: Every day | ORAL | 3 refills | Status: DC
Start: 2020-12-13 — End: 2021-09-12

## 2020-12-13 MED ORDER — AMLODIPINE BESYLATE 10 MG PO TABS: 10.0000 mg | ORAL_TABLET | Freq: Every day | ORAL | 3 refills | Status: DC

## 2020-12-13 MED ORDER — TELMISARTAN 80 MG PO TABS: 80.0000 mg | ORAL_TABLET | Freq: Every day | ORAL | 3 refills | Status: DC

## 2020-12-13 NOTE — Patient Instructions (Addendum)
Medication Instructions:  No changes  If you need a refill on your cardiac medications before your next appointment, please call your pharmacy.   Lab work: CBC & BMP  Testing/Procedures: No new testing needed  Follow-Up: At [facility], you and your health needs are our priority.  As part of our continuing mission to provide you with exceptional heart care, we have created designated Provider Care Teams.  These Care Teams include your primary Cardiologist (physician) and Advanced Practice Providers (APPs -  Physician Assistants and Nurse Practitioners) who all work together to provide you with the care you need, when you need it.  You will need a follow up appointment in 12 months  Providers on your designated Care Team:   Murray Hodgkins, NP Christell Faith, PA-C Marrianne Mood, PA-C Cadence Purvis, [place]  COVID-19 Vaccine Information can be found at: ShippingScam.co.uk For questions related to vaccine distribution or appointments, please email vaccine@[email or web address] .com or call [phone].

## 2020-12-14 LAB — BASIC METABOLIC PANEL
BUN/Creatinine Ratio: 15 (ref 10–24)
BUN: 20 mg/dL (ref 8–27)
CO2: 20 mmol/L (ref 20–29)
Calcium: 9.5 mg/dL (ref 8.6–10.2)
Chloride: 109 mmol/L — ABNORMAL HIGH (ref 96–106)
Creatinine, Ser: 1.32 mg/dL — ABNORMAL HIGH (ref 0.76–1.27)
Glucose: 90 mg/dL (ref 65–99)
Potassium: 4.3 mmol/L (ref 3.5–5.2)
Sodium: 141 mmol/L (ref 134–144)
eGFR: 57 mL/min/{1.73_m2} — ABNORMAL LOW (ref >59)

## 2020-12-14 LAB — CBC
Hematocrit: 35.3 % — ABNORMAL LOW (ref 37.5–51.0)
Hemoglobin: 11.4 g/dL — ABNORMAL LOW (ref 13.0–17.7)
MCH: 29 pg (ref 26.6–33.0)
MCHC: 32.3 g/dL (ref 31.5–35.7)
MCV: 90 fL (ref 79–97)
Platelets: 132 10*3/uL — ABNORMAL LOW (ref 150–450)
RBC: 3.93 x10E6/uL — ABNORMAL LOW (ref 4.14–5.80)
RDW: 15.9 % — ABNORMAL HIGH (ref 11.6–15.4)
WBC: 5.5 10*3/uL (ref 3.4–10.8)

## 2020-12-16 ENCOUNTER — Telehealth: Payer: Self-pay

## 2020-12-16 NOTE — Telephone Encounter (Signed)
Left detail message on VM of pt's recent results okay by DPR, Dr. Rockey Situ advised   "Lab work  Much improved blood count  Hemoglobin 8.6 now 11.4, close to normal range  Renal function little bit high, may need more fluids "  At this time, no further recommendations or medications changes, advised to call office for any concerns or questions, otherwise will see at next visit.

## 2020-12-26 ENCOUNTER — Other Ambulatory Visit (INDEPENDENT_AMBULATORY_CARE_PROVIDER_SITE_OTHER): Payer: Self-pay | Admitting: Vascular Surgery

## 2020-12-26 ENCOUNTER — Telehealth (INDEPENDENT_AMBULATORY_CARE_PROVIDER_SITE_OTHER): Payer: Self-pay

## 2020-12-26 NOTE — Telephone Encounter (Signed)
Bring him in with ABIs and a left lower extremity arterial duplex to see me or GS

## 2020-12-26 NOTE — Telephone Encounter (Signed)
Someone left a voicemail stating that the patient is having pain with big toe. I left a message on the patient voicemail to return a call back to the office

## 2020-12-26 NOTE — Telephone Encounter (Signed)
Called stating that left foot is having sharpe pain and his is also having calf tightness in the same leg. Patient states that he hasnt had any relief in about 2 weeks. Patient was last seen 10/19/20 with abi and le arterial studies (GS). Please advise.

## 2021-01-03 ENCOUNTER — Ambulatory Visit (INDEPENDENT_AMBULATORY_CARE_PROVIDER_SITE_OTHER): Payer: Medicare Other

## 2021-01-03 ENCOUNTER — Ambulatory Visit (INDEPENDENT_AMBULATORY_CARE_PROVIDER_SITE_OTHER): Payer: Medicare Other | Admitting: Nurse Practitioner

## 2021-01-03 ENCOUNTER — Telehealth (INDEPENDENT_AMBULATORY_CARE_PROVIDER_SITE_OTHER): Payer: Self-pay

## 2021-01-03 ENCOUNTER — Encounter (INDEPENDENT_AMBULATORY_CARE_PROVIDER_SITE_OTHER): Payer: Self-pay

## 2021-01-03 ENCOUNTER — Other Ambulatory Visit: Payer: Self-pay

## 2021-01-03 ENCOUNTER — Encounter (INDEPENDENT_AMBULATORY_CARE_PROVIDER_SITE_OTHER): Payer: Self-pay | Admitting: Nurse Practitioner

## 2021-01-03 VITALS — BP 133/65 | HR 65 | Ht 71.0 in | Wt 151.0 lb

## 2021-01-03 DIAGNOSIS — I7025 Atherosclerosis of native arteries of other extremities with ulceration: Secondary | ICD-10-CM

## 2021-01-03 DIAGNOSIS — Z720 Tobacco use: Secondary | ICD-10-CM | POA: Diagnosis not present

## 2021-01-03 DIAGNOSIS — E782 Mixed hyperlipidemia: Secondary | ICD-10-CM

## 2021-01-03 DIAGNOSIS — I10 Essential (primary) hypertension: Secondary | ICD-10-CM | POA: Diagnosis not present

## 2021-01-03 MED ORDER — HYDROCODONE-ACETAMINOPHEN 5-325 MG PO TABS: 1.0000 | ORAL_TABLET | Freq: Four times a day (QID) | ORAL | 0 refills | Status: DC | PRN

## 2021-01-03 MED ORDER — GABAPENTIN 300 MG PO CAPS: 300.0000 mg | ORAL_CAPSULE | Freq: Every day | ORAL | 3 refills | Status: DC

## 2021-01-03 NOTE — Telephone Encounter (Addendum)
Patient is schedule for left lower extremity angio with Dr Delana Meyer on 01/10/21 arrival time 11:15 am (patient daughter was made aware with correct arrival time). Pre-procedure instructions were gone over with patient daughter while in office today.

## 2021-01-09 ENCOUNTER — Encounter (INDEPENDENT_AMBULATORY_CARE_PROVIDER_SITE_OTHER): Payer: Self-pay | Admitting: Nurse Practitioner

## 2021-01-09 NOTE — Progress Notes (Signed)
Subjective:    Patient ID: Samuel Carney, male    DOB: [birth year], 75 y.o.   MRN: [id number] Chief Complaint  Patient presents with   Follow-up    Add on per phone note LE arterial abi     [name] this 75 year old male that presents today due to worsening leg pain has been happening over the last 3 weeks. There has been a significant deterioration in the lower extremity symptoms.  The patient notes interval shortening of their claudication distance and development of mild rest pain symptoms. No new ulcers or wounds have occurred since the last visit.  There have been no significant changes to the patient's overall health care.  The patient denies amaurosis fugax or recent TIA symptoms. There are no recent neurological changes noted. The patient denies history of DVT, PE or superficial thrombophlebitis. The patient denies recent episodes of angina or shortness of breath.   Left lower extremity arterial duplex shows occlusion of the mid SFA with absent waveforms throughout the lower leg.  There is hyperemic flow at the distal peroneal.   Review of Systems  Cardiovascular:        Leg pain  Neurological:  Positive for weakness.  All other systems reviewed and are negative.     Objective:   Physical Exam Vitals reviewed.  HENT:     Head: Normocephalic.  Cardiovascular:     Rate and Rhythm: Normal rate.     Pulses:          Dorsalis pedis pulses are 0 on the left side.       Posterior tibial pulses are 0 on the left side.  Pulmonary:     Effort: Pulmonary effort is normal.  Neurological:     Mental Status: He is alert and oriented to person, place, and time.  Psychiatric:        Mood and Affect: Mood normal.        Behavior: Behavior normal.        Thought Content: Thought content normal.        Judgment: Judgment normal.    BP 133/65   Pulse 65   Ht 5\' 11"  (1.803 m)   Wt 151 lb (68.5 kg)   BMI 21.06 kg/m   Past Medical History:  Diagnosis Date   Alcohol  abuse    Aortic atherosclerosis (HCC)    B12 deficiency    Cirrhosis (HCC)    COPD (chronic obstructive pulmonary disease) (HCC)    Coronary artery disease    GERD (gastroesophageal reflux disease)    Glaucoma    Grade II diastolic dysfunction    History of kidney stones    HLD (hyperlipidemia)    Hx of radiation therapy    Hypertension    Lung mass    Moderate mitral regurgitation    Pneumonia    PVD (peripheral vascular disease) (HCC)    Squamous cell carcinoma of lung, right ([place]) 2019    Social History   Socioeconomic History   Marital status: Divorced    Spouse name: Not on file   Number of children: Not on file   Years of education: Not on file   Highest education level: Not on file  Occupational History   Not on file  Tobacco Use   Smoking status: Every Day    Packs/day: 1.00    Years: 50.00    Pack years: 50.00    Types: Cigarettes   Smokeless tobacco: Never  Vaping Use  Vaping Use: Never used  Substance and Sexual Activity   Alcohol use: Yes    Comment: "1 pint of liquor per day"   Drug use: Never   Sexual activity: Yes  Other Topics Concern   Not on file  Social History Narrative   Live in girl friend   Social Determinants of Health   Financial Resource Strain: Not on file  Food Insecurity: Not on file  Transportation Needs: Not on file  Physical Activity: Not on file  Stress: Not on file  Social Connections: Not on file  Intimate Partner Violence: Not on file    Past Surgical History:  Procedure Laterality Date   COLONOSCOPY WITH PROPOFOL     COLONOSCOPY WITH PROPOFOL N/A 11/25/2018   Procedure: COLONOSCOPY WITH PROPOFOL;  Surgeon: Lollie Sails, MD;  Location: [facility] ENDOSCOPY;  Service: Endoscopy;  Laterality: N/A;   ENDARTERECTOMY FEMORAL Left 07/13/2020   Procedure: ENDARTERECTOMY FEMORAL ( SFA STENT);  Surgeon: Katha Cabal, MD;  Location: [facility] ORS;  Service: Vascular;  Laterality: Left;   ESOPHAGOGASTRODUODENOSCOPY (EGD) WITH  PROPOFOL N/A 11/25/2018   Procedure: ESOPHAGOGASTRODUODENOSCOPY (EGD) WITH PROPOFOL;  Surgeon: Lollie Sails, MD;  Location: [facility] ENDOSCOPY;  Service: Endoscopy;  Laterality: N/A;   LOWER EXTREMITY ANGIOGRAPHY Left 06/07/2020   Procedure: LOWER EXTREMITY ANGIOGRAPHY;  Surgeon: Katha Cabal, MD;  Location: [facility];  Service: Cardiovascular;  Laterality: Left;    Family History  Problem Relation Age of Onset   Stroke Brother    Diabetes Mellitus II Brother    Diabetes Mellitus II Mother    Diabetes Mellitus II Maternal Uncle     No Known Allergies  CBC Latest Ref Rng & Units 12/13/2020 07/13/2020 07/13/2020  WBC 3.4 - 10.8 x10E3/uL 5.5 9.7 6.4  Hemoglobin 13.0 - 17.7 g/dL 11.4(L) 8.6(L) 8.9(L)  Hematocrit 37.5 - 51.0 % 35.3(L) 26.8(L) 28.6(L)  Platelets 150 - 450 x10E3/uL 132(L) 92(L) 100(L)      CMP     Component Value Date/Time   NA 141 12/13/2020 0841   NA 136 12/28/2012 0547   K 4.3 12/13/2020 0841   K 3.5 12/28/2012 0547   CL 109 (H) 12/13/2020 0841   CL 105 12/28/2012 0547   CO2 20 12/13/2020 0841   CO2 26 12/28/2012 0547   GLUCOSE 90 12/13/2020 0841   GLUCOSE 126 (H) 07/13/2020 2346   GLUCOSE 96 12/28/2012 0547   BUN 20 12/13/2020 0841   BUN 14 12/28/2012 0547   CREATININE 1.32 (H) 12/13/2020 0841   CREATININE 1.09 12/28/2012 0547   CALCIUM 9.5 12/13/2020 0841   CALCIUM 8.8 12/28/2012 0547   PROT 6.9 12/28/2012 0547   ALBUMIN 3.1 (L) 12/28/2012 0547   AST 32 12/28/2012 0547   ALT 16 12/28/2012 0547   ALKPHOS 91 12/28/2012 0547   BILITOT 1.0 12/28/2012 0547   GFRNONAA >60 07/13/2020 2346   GFRNONAA >60 12/28/2012 0547   GFRAA >60 12/28/2012 0547     No results found.     Assessment & Plan:   1. Atherosclerosis of native arteries of the extremities with ulceration ([place]) Recommend:  The patient has evidence of severe atherosclerotic changes of both lower extremities with rest pain that is associated with preulcerative changes and  impending tissue loss of the foot.  This represents a limb threatening ischemia and places the patient at the risk for limb loss.  Patient should undergo angiography of the lower extremities with the hope for intervention for limb salvage.  The risks  and benefits as well as the alternative therapies was discussed in detail with the patient.  All questions were answered.  Patient agrees to proceed with angiography.  The patient will follow up with me in the office after the procedure.   The patient was offered a sooner appointment however he did not wish to move forward  2. Benign essential hypertension Continue antihypertensive medications as already ordered, these medications have been reviewed and there are no changes at this time.   3. Hyperlipidemia, mixed Continue statin as ordered and reviewed, no changes at this time     4. Tobacco abuse Smoking cessation was discussed, 3-10 minutes spent on this topic specifically   Current Outpatient Medications on File Prior to Visit  Medication Sig Dispense Refill   acetaminophen (TYLENOL) 500 MG tablet Take 1,000 mg by mouth every 6 (six) hours as needed for moderate pain or mild pain.     amLODipine (NORVASC) 10 MG tablet Take 1 tablet (10 mg total) by mouth daily. 90 tablet 3   aspirin EC 81 MG EC tablet Take 1 tablet (81 mg total) by mouth daily at 6 (six) AM. Swallow whole. 90 tablet 3   clopidogrel (PLAVIX) 75 MG tablet Take 1 tablet (75 mg total) by mouth daily. 30 tablet 11   cyanocobalamin (,VITAMIN B-12,) 1000 MCG/ML injection Inject 1,000 mcg into the muscle every 30 (thirty) days.     dorzolamide-timolol (COSOPT) 22.3-6.8 MG/ML ophthalmic solution Place 1 drop into both eyes 2 (two) times daily.  6   doxazosin (CARDURA) 2 MG tablet Take 1 tablet (2 mg total) by mouth daily. 90 tablet 3   metoprolol tartrate (LOPRESSOR) 100 MG tablet Take 1 tablet (100 mg total) by mouth 2 (two) times daily. 180 tablet 3   pantoprazole (PROTONIX) 40  MG tablet Take 40 mg by mouth daily.     potassium chloride SA (KLOR-CON) 20 MEQ tablet Take 20 mEq by mouth daily.     rosuvastatin (CRESTOR) 5 MG tablet Take 1 tablet (5 mg total) by mouth daily. 90 tablet 3   telmisartan (MICARDIS) 80 MG tablet Take 1 tablet (80 mg total) by mouth daily. 90 tablet 3   Travoprost, BAK Free, (TRAVATAN) 0.004 % SOLN ophthalmic solution Place 1 drop into both eyes at bedtime.     No current facility-administered medications on file prior to visit.    There are no Patient Instructions on file for this visit. No follow-ups on file.   Kris Hartmann, NP

## 2021-01-09 NOTE — H&P (View-Only) (Signed)
Subjective:    Patient ID: Samuel Carney, male    DOB: 11/07/1945, 75 y.o.   MRN: [id number] Chief Complaint  Patient presents with   Follow-up    Add on per phone note LE arterial abi     Samuel Carney this 75 year old male that presents today due to worsening leg pain has been happening over the last 3 weeks. There has been a significant deterioration in the lower extremity symptoms.  The patient notes interval shortening of their claudication distance and development of mild rest pain symptoms. No new ulcers or wounds have occurred since the last visit.  There have been no significant changes to the patient's overall health care.  The patient denies amaurosis fugax or recent TIA symptoms. There are no recent neurological changes noted. The patient denies history of DVT, PE or superficial thrombophlebitis. The patient denies recent episodes of angina or shortness of breath.   Left lower extremity arterial duplex shows occlusion of the mid SFA with absent waveforms throughout the lower leg.  There is hyperemic flow at the distal peroneal.   Review of Systems  Cardiovascular:        Leg pain  Neurological:  Positive for weakness.  All other systems reviewed and are negative.     Objective:   Physical Exam Vitals reviewed.  HENT:     Head: Normocephalic.  Cardiovascular:     Rate and Rhythm: Normal rate.     Pulses:          Dorsalis pedis pulses are 0 on the left side.       Posterior tibial pulses are 0 on the left side.  Pulmonary:     Effort: Pulmonary effort is normal.  Neurological:     Mental Status: He is alert and oriented to person, place, and time.  Psychiatric:        Mood and Affect: Mood normal.        Behavior: Behavior normal.        Thought Content: Thought content normal.        Judgment: Judgment normal.    BP 133/65   Pulse 65   Ht 5\' 11"  (1.803 m)   Wt 151 lb (68.5 kg)   BMI 21.06 kg/m   Past Medical History:  Diagnosis Date   Alcohol  abuse    Aortic atherosclerosis (HCC)    B12 deficiency    Cirrhosis (HCC)    COPD (chronic obstructive pulmonary disease) (HCC)    Coronary artery disease    GERD (gastroesophageal reflux disease)    Glaucoma    Grade II diastolic dysfunction    History of kidney stones    HLD (hyperlipidemia)    Hx of radiation therapy    Hypertension    Lung mass    Moderate mitral regurgitation    Pneumonia    PVD (peripheral vascular disease) (HCC)    Squamous cell carcinoma of lung, right ([place]) 2019    Social History   Socioeconomic History   Marital status: Divorced    Spouse name: Not on file   Number of children: Not on file   Years of education: Not on file   Highest education level: Not on file  Occupational History   Not on file  Tobacco Use   Smoking status: Every Day    Packs/day: 1.00    Years: 50.00    Pack years: 50.00    Types: Cigarettes   Smokeless tobacco: Never  Vaping Use  Vaping Use: Never used  Substance and Sexual Activity   Alcohol use: Yes    Comment: "1 pint of liquor per day"   Drug use: Never   Sexual activity: Yes  Other Topics Concern   Not on file  Social History Narrative   Live in girl friend   Social Determinants of Health   Financial Resource Strain: Not on file  Food Insecurity: Not on file  Transportation Needs: Not on file  Physical Activity: Not on file  Stress: Not on file  Social Connections: Not on file  Intimate Partner Violence: Not on file    Past Surgical History:  Procedure Laterality Date   COLONOSCOPY WITH PROPOFOL     COLONOSCOPY WITH PROPOFOL N/A 11/25/2018   Procedure: COLONOSCOPY WITH PROPOFOL;  Surgeon: Lollie Sails, MD;  Location: [facility] ENDOSCOPY;  Service: Endoscopy;  Laterality: N/A;   ENDARTERECTOMY FEMORAL Left 07/13/2020   Procedure: ENDARTERECTOMY FEMORAL ( SFA STENT);  Surgeon: Katha Cabal, MD;  Location: [facility] ORS;  Service: Vascular;  Laterality: Left;   ESOPHAGOGASTRODUODENOSCOPY (EGD) WITH  PROPOFOL N/A 11/25/2018   Procedure: ESOPHAGOGASTRODUODENOSCOPY (EGD) WITH PROPOFOL;  Surgeon: Lollie Sails, MD;  Location: [facility] ENDOSCOPY;  Service: Endoscopy;  Laterality: N/A;   LOWER EXTREMITY ANGIOGRAPHY Left 06/07/2020   Procedure: LOWER EXTREMITY ANGIOGRAPHY;  Surgeon: Katha Cabal, MD;  Location: [place] [facility];  Service: Cardiovascular;  Laterality: Left;    Family History  Problem Relation Age of Onset   Stroke Brother    Diabetes Mellitus II Brother    Diabetes Mellitus II Mother    Diabetes Mellitus II Maternal Uncle     No Known Allergies  CBC Latest Ref Rng & Units 12/13/2020 07/13/2020 07/13/2020  WBC 3.4 - 10.8 x10E3/uL 5.5 9.7 6.4  Hemoglobin 13.0 - 17.7 g/dL 11.4(L) 8.6(L) 8.9(L)  Hematocrit 37.5 - 51.0 % 35.3(L) 26.8(L) 28.6(L)  Platelets 150 - 450 x10E3/uL 132(L) 92(L) 100(L)      CMP     Component Value Date/Time   NA 141 12/13/2020 0841   NA 136 12/28/2012 0547   K 4.3 12/13/2020 0841   K 3.5 12/28/2012 0547   CL 109 (H) 12/13/2020 0841   CL 105 12/28/2012 0547   CO2 20 12/13/2020 0841   CO2 26 12/28/2012 0547   GLUCOSE 90 12/13/2020 0841   GLUCOSE 126 (H) 07/13/2020 2346   GLUCOSE 96 12/28/2012 0547   BUN 20 12/13/2020 0841   BUN 14 12/28/2012 0547   CREATININE 1.32 (H) 12/13/2020 0841   CREATININE 1.09 12/28/2012 0547   CALCIUM 9.5 12/13/2020 0841   CALCIUM 8.8 12/28/2012 0547   PROT 6.9 12/28/2012 0547   ALBUMIN 3.1 (L) 12/28/2012 0547   AST 32 12/28/2012 0547   ALT 16 12/28/2012 0547   ALKPHOS 91 12/28/2012 0547   BILITOT 1.0 12/28/2012 0547   GFRNONAA >60 07/13/2020 2346   GFRNONAA >60 12/28/2012 0547   GFRAA >60 12/28/2012 0547     No results found.     Assessment & Plan:   1. Atherosclerosis of native arteries of the extremities with ulceration ([place]) Recommend:  The patient has evidence of severe atherosclerotic changes of both lower extremities with rest pain that is associated with preulcerative changes and  impending tissue loss of the foot.  This represents a limb threatening ischemia and places the patient at the risk for limb loss.  Patient should undergo angiography of the lower extremities with the hope for intervention for limb salvage.  The risks  and benefits as well as the alternative therapies was discussed in detail with the patient.  All questions were answered.  Patient agrees to proceed with angiography.  The patient will follow up with me in the office after the procedure.   The patient was offered a sooner appointment however he did not wish to move forward  2. Benign essential hypertension Continue antihypertensive medications as already ordered, these medications have been reviewed and there are no changes at this time.   3. Hyperlipidemia, mixed Continue statin as ordered and reviewed, no changes at this time     4. Tobacco abuse Smoking cessation was discussed, 3-10 minutes spent on this topic specifically   Current Outpatient Medications on File Prior to Visit  Medication Sig Dispense Refill   acetaminophen (TYLENOL) 500 MG tablet Take 1,000 mg by mouth every 6 (six) hours as needed for moderate pain or mild pain.     amLODipine (NORVASC) 10 MG tablet Take 1 tablet (10 mg total) by mouth daily. 90 tablet 3   aspirin EC 81 MG EC tablet Take 1 tablet (81 mg total) by mouth daily at 6 (six) AM. Swallow whole. 90 tablet 3   clopidogrel (PLAVIX) 75 MG tablet Take 1 tablet (75 mg total) by mouth daily. 30 tablet 11   cyanocobalamin (,VITAMIN B-12,) 1000 MCG/ML injection Inject 1,000 mcg into the muscle every 30 (thirty) days.     dorzolamide-timolol (COSOPT) 22.3-6.8 MG/ML ophthalmic solution Place 1 drop into both eyes 2 (two) times daily.  6   doxazosin (CARDURA) 2 MG tablet Take 1 tablet (2 mg total) by mouth daily. 90 tablet 3   metoprolol tartrate (LOPRESSOR) 100 MG tablet Take 1 tablet (100 mg total) by mouth 2 (two) times daily. 180 tablet 3   pantoprazole (PROTONIX) 40  MG tablet Take 40 mg by mouth daily.     potassium chloride SA (KLOR-CON) 20 MEQ tablet Take 20 mEq by mouth daily.     rosuvastatin (CRESTOR) 5 MG tablet Take 1 tablet (5 mg total) by mouth daily. 90 tablet 3   telmisartan (MICARDIS) 80 MG tablet Take 1 tablet (80 mg total) by mouth daily. 90 tablet 3   Travoprost, BAK Free, (TRAVATAN) 0.004 % SOLN ophthalmic solution Place 1 drop into both eyes at bedtime.     No current facility-administered medications on file prior to visit.    There are no Patient Instructions on file for this visit. No follow-ups on file.   Kris Hartmann, NP

## 2021-01-10 ENCOUNTER — Other Ambulatory Visit (INDEPENDENT_AMBULATORY_CARE_PROVIDER_SITE_OTHER): Payer: Self-pay | Admitting: Nurse Practitioner

## 2021-01-10 ENCOUNTER — Other Ambulatory Visit: Payer: Self-pay

## 2021-01-10 ENCOUNTER — Encounter: Payer: Self-pay | Admitting: Vascular Surgery

## 2021-01-10 ENCOUNTER — Ambulatory Visit
Admission: RE | Admit: 2021-01-10 | Discharge: 2021-01-10 | Disposition: A | Discharge status: Home / Self Care | Payer: Medicare Other | Source: Ambulatory Visit | Attending: Vascular Surgery | Admitting: Vascular Surgery

## 2021-01-10 ENCOUNTER — Encounter
Admission: RE | Disposition: A | Discharge status: Home / Self Care | Payer: Self-pay | Source: Ambulatory Visit | Attending: Vascular Surgery

## 2021-01-10 DIAGNOSIS — Z01818 Encounter for other preprocedural examination: Secondary | ICD-10-CM

## 2021-01-10 DIAGNOSIS — Z7902 Long term (current) use of antithrombotics/antiplatelets: Secondary | ICD-10-CM | POA: Diagnosis not present

## 2021-01-10 DIAGNOSIS — Z7982 Long term (current) use of aspirin: Secondary | ICD-10-CM | POA: Diagnosis not present

## 2021-01-10 DIAGNOSIS — Z79899 Other long term (current) drug therapy: Secondary | ICD-10-CM | POA: Insufficient documentation

## 2021-01-10 DIAGNOSIS — F17210 Nicotine dependence, cigarettes, uncomplicated: Secondary | ICD-10-CM | POA: Insufficient documentation

## 2021-01-10 DIAGNOSIS — I10 Essential (primary) hypertension: Secondary | ICD-10-CM | POA: Insufficient documentation

## 2021-01-10 DIAGNOSIS — I70229 Atherosclerosis of native arteries of extremities with rest pain, unspecified extremity: Secondary | ICD-10-CM

## 2021-01-10 DIAGNOSIS — I70262 Atherosclerosis of native arteries of extremities with gangrene, left leg: Secondary | ICD-10-CM

## 2021-01-10 DIAGNOSIS — E782 Mixed hyperlipidemia: Secondary | ICD-10-CM | POA: Diagnosis not present

## 2021-01-10 HISTORY — PX: LOWER EXTREMITY ANGIOGRAPHY: CATH118251

## 2021-01-10 SURGERY — LOWER EXTREMITY ANGIOGRAPHY
Anesthesia: Moderate Sedation | Site: Leg Lower | Laterality: Left

## 2021-01-10 MED ORDER — CEFAZOLIN SODIUM-DEXTROSE 2-4 GM/100ML-% IV SOLN
INTRAVENOUS | Status: AC
  Filled 2021-01-10: qty 100

## 2021-01-10 MED ORDER — HEPARIN SODIUM (PORCINE) 1000 UNIT/ML IJ SOLN
INTRAMUSCULAR | Status: DC | PRN
  Administered 2021-01-10: 5000 [IU] via INTRAVENOUS

## 2021-01-10 MED ORDER — SODIUM CHLORIDE 0.9% FLUSH: 3.0000 mL | Freq: Two times a day (BID) | INTRAVENOUS | Status: DC

## 2021-01-10 MED ORDER — FENTANYL CITRATE PF 50 MCG/ML IJ SOSY
PREFILLED_SYRINGE | INTRAMUSCULAR | Status: AC
  Filled 2021-01-10: qty 1

## 2021-01-10 MED ORDER — FAMOTIDINE 20 MG PO TABS: 40.0000 mg | ORAL_TABLET | Freq: Once | ORAL | Status: DC | PRN

## 2021-01-10 MED ORDER — HEPARIN SODIUM (PORCINE) 1000 UNIT/ML IJ SOLN
INTRAMUSCULAR | Status: AC
  Filled 2021-01-10: qty 1

## 2021-01-10 MED ORDER — MORPHINE SULFATE (PF) 4 MG/ML IV SOLN: 2.0000 mg | INTRAVENOUS | Status: DC | PRN

## 2021-01-10 MED ORDER — METHYLPREDNISOLONE SODIUM SUCC 125 MG IJ SOLR: 125.0000 mg | Freq: Once | INTRAMUSCULAR | Status: DC | PRN

## 2021-01-10 MED ORDER — MIDAZOLAM HCL 2 MG/2ML IJ SOLN
INTRAMUSCULAR | Status: DC | PRN
  Administered 2021-01-10: 2 mg via INTRAVENOUS
  Administered 2021-01-10 (×2): .5 mg via INTRAVENOUS

## 2021-01-10 MED ORDER — SODIUM CHLORIDE 0.9% FLUSH: 3.0000 mL | INTRAVENOUS | Status: DC | PRN

## 2021-01-10 MED ORDER — ONDANSETRON HCL 4 MG/2ML IJ SOLN: 4.0000 mg | Freq: Four times a day (QID) | INTRAMUSCULAR | Status: DC | PRN

## 2021-01-10 MED ORDER — HYDRALAZINE HCL 20 MG/ML IJ SOLN
5.0000 mg | INTRAMUSCULAR | Status: DC | PRN
Start: 2021-01-10 — End: 2021-01-11

## 2021-01-10 MED ORDER — MIDAZOLAM HCL 2 MG/ML PO SYRP: 8.0000 mg | ORAL_SOLUTION | Freq: Once | ORAL | Status: DC | PRN

## 2021-01-10 MED ORDER — MIDAZOLAM HCL 5 MG/5ML IJ SOLN
INTRAMUSCULAR | Status: AC
  Filled 2021-01-10: qty 5

## 2021-01-10 MED ORDER — SODIUM CHLORIDE 0.9 % IV SOLN: INTRAVENOUS | Status: DC

## 2021-01-10 MED ORDER — OXYCODONE HCL 5 MG PO TABS: 5.0000 mg | ORAL_TABLET | ORAL | Status: DC | PRN

## 2021-01-10 MED ORDER — CEFAZOLIN SODIUM-DEXTROSE 2-4 GM/100ML-% IV SOLN: 2.0000 g | Freq: Once | INTRAVENOUS | Status: DC

## 2021-01-10 MED ORDER — FENTANYL CITRATE (PF) 100 MCG/2ML IJ SOLN
INTRAMUSCULAR | Status: DC | PRN
  Administered 2021-01-10: 50 ug via INTRAVENOUS
  Administered 2021-01-10 (×2): 25 ug via INTRAVENOUS

## 2021-01-10 MED ORDER — DIPHENHYDRAMINE HCL 50 MG/ML IJ SOLN: 50.0000 mg | Freq: Once | INTRAMUSCULAR | Status: DC | PRN

## 2021-01-10 MED ORDER — LABETALOL HCL 5 MG/ML IV SOLN
10.0000 mg | INTRAVENOUS | Status: DC | PRN
Start: 2021-01-10 — End: 2021-01-11

## 2021-01-10 MED ORDER — SODIUM CHLORIDE 0.9 % IV SOLN: 250.0000 mL | INTRAVENOUS | Status: DC | PRN

## 2021-01-10 MED ORDER — HYDROMORPHONE HCL 1 MG/ML IJ SOLN
1.0000 mg | Freq: Once | INTRAMUSCULAR | Status: DC | PRN
Start: 2021-01-10 — End: 2021-01-11

## 2021-01-10 MED ORDER — IODIXANOL 320 MG/ML IV SOLN
INTRAVENOUS | Status: DC | PRN
  Administered 2021-01-10: 65 mL via INTRA_ARTERIAL

## 2021-01-10 MED ORDER — ACETAMINOPHEN 325 MG PO TABS: 650.0000 mg | ORAL_TABLET | ORAL | Status: DC | PRN

## 2021-01-10 SURGICAL SUPPLY — 13 items
CANNULA 5F STIFF (CANNULA) ×2 IMPLANT
CATH ANGIO 5F PIGTAIL 65CM (CATHETERS) ×2 IMPLANT
CATH BEACON 5 .035 65 KMP TIP (CATHETERS) ×2 IMPLANT
CATH SEEKER .035X135CM (CATHETERS) ×2 IMPLANT
COVER PROBE U/S 5X48 (MISCELLANEOUS) ×2 IMPLANT
DEVICE STARCLOSE SE CLOSURE (Vascular Products) ×2 IMPLANT
GLIDEWIRE ADV .035X260CM (WIRE) ×2 IMPLANT
PACK ANGIOGRAPHY (CUSTOM PROCEDURE TRAY) ×2 IMPLANT
SHEATH ANL2 6FRX45 HC (SHEATH) ×2 IMPLANT
SHEATH BRITE TIP 5FRX11 (SHEATH) ×2 IMPLANT
SYR MEDRAD MARK 7 150ML (SYRINGE) ×2 IMPLANT
TUBING CONTRAST HIGH PRESS 72 (TUBING) ×2 IMPLANT
WIRE GUIDERIGHT .035X150 (WIRE) ×2 IMPLANT

## 2021-01-10 NOTE — Interval H&P Note (Signed)
History and Physical Interval Note:  01/10/2021 11:28 AM  Samuel Carney  has presented today for surgery, with the diagnosis of LLE Angio   BARD   ASO w rest pain.  The various methods of treatment have been discussed with the patient and family. After consideration of risks, benefits and other options for treatment, the patient has consented to  Procedure(s): LOWER EXTREMITY ANGIOGRAPHY (Left) as a surgical intervention.  The patient's history has been reviewed, patient examined, no change in status, stable for surgery.  I have reviewed the patient's chart and labs.  Questions were answered to the patient's satisfaction.     [name]

## 2021-01-10 NOTE — Op Note (Signed)
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  Abdominal aortogram  2.  Left lower extremity distal runoff third order catheter placement  3.  Ultrasound-guided access to the right common femoral  4.  StarClose right common femoral   Anesthesia: Conscious sedation was administered by the interventional radiology RN under my direct supervision. IV Versed plus fentanyl were utilized. Continuous ECG, pulse oximetry and blood pressure was monitored throughout the entire procedure.  Conscious sedation was administered for a total of 55 minutes and 24 seconds.  Sheath: 6 [place] Ansell right common femoral retrograde  Contrast: 65 cc   Fluoroscopy Time: 6.0 minutes  Indications:  The patient presents to [facility] with gangrenous changes to the left foot.  Pedal pulses are nonpalpable bilaterally suggesting atherosclerotic occlusive disease.  The risks and benefits as well as alternative therapies for lower extremity revascularization are reviewed with the patient all questions are answered the patient agrees to proceed.  The patient is therefore undergoing angiography with the hope for intervention for limb salvage.   Procedure:  Samuel Carney a 75 y.o. male who was identified and appropriate procedural time out was performed.  The patient was then placed supine on the table and prepped and draped in the usual sterile fashion.  Ultrasound was used to evaluate the right common femoral artery.  It was echolucent and pulsatile indicating it is patent .  An ultrasound image was acquired for the permanent record.  A micropuncture needle was used to access the right common femoral artery under direct ultrasound guidance.  The  microwire was then advanced under fluoroscopic guidance without difficulty followed by the micro-sheath.  A 0.035 J wire was advanced without resistance and a 5Fr sheath was placed.    Pigtail catheter was then advanced to the level of T12 and AP projection of the aorta was obtained. Pigtail catheter was then repositioned to above the bifurcation and RAO view of the pelvis was obtained. Stiff angled advantage Glidewire and pigtail catheter was then used across the bifurcation and the catheter was positioned in the distal external iliac artery.  LAO of the left groin was then obtained. Wire was reintroduced and the pigtail catheter exchanged for a vertebral catheter negotiated into the SFA and the catheter was advanced into the SFA.  Subsequently the Kumpe catheter was exchanged for a 150 straight seeker catheter and the advantage wire and seeker catheter waited advanced down through the previously placed stents and into the mid posterior tibial artery.  Distal runoff was then performed.  After review of the images the catheter was removed over wire and an right view of the groin was obtained. StarClose device was deployed without difficulty.   Findings:   Aortogram: Abdominal aorta is diffusely diseased but there are no hemodynamically significant stenoses.  There are some mild dilatation that does not appear to be a sizable aneurysm.  Bilateral common and external iliac arteries are diffusely diseased previously placed left iliac stents are widely patent internal iliac artery on the left is occluded  Right Lower Extremity: Common femoral and visualized portions of the profunda femoris are diffusely diseased but patent  Left Lower Extremity: The left common femoral demonstrates changes consistent with a femoral endarterectomy and patch reconstruction.  Profunda femoris is patent.  Superficial femoral artery is  occluded approximately 5 to 10 mm distal to its origin essentially at the leading edge of the  previously placed stent.  The stented SFA and popliteal are occluded stents extend into the posterior tibial artery by approximately 5 to 10 cm and this is occluded as well.  With the seeker catheter in the mid posterior tibial artery distal to the previously placed stents hand-injection of contrast demonstrates that the posterior tibial artery is essentially occluded throughout the length of its course there is nonfilling of the plantar vessels there is no contribution to the forefoot via the posterior tibial.  Not only is this artery not capable of supporting Rie intervention it is not a bypass target there is no hope for reconstruction through the posterior tibial.  Hand-injection of contrast more proximally then demonstrates several collaterals which fill the peroneal.  The peroneal appears to be patent from near its origin down to the ankle where a typical collateral then fills the dorsalis pedis.  There appears to be a 50% stenosis over short distance in the midportion of the peroneal.  Disposition: Patient was taken to the recovery room in stable condition having tolerated the procedure well.  PLAN: Patient has the possibility of a femoral to peroneal bypass but only if a vein is available.  This reconstruction would not be supported using prosthetic as the bypass conduit.  The mid lesion can be addressed at the time of surgery.  Belenda Cruise Kymia Simi 01/10/2021,2:51 PM

## 2021-01-11 ENCOUNTER — Encounter: Payer: Self-pay | Admitting: Vascular Surgery

## 2021-01-16 ENCOUNTER — Other Ambulatory Visit (INDEPENDENT_AMBULATORY_CARE_PROVIDER_SITE_OTHER): Payer: Self-pay | Admitting: Vascular Surgery

## 2021-01-16 DIAGNOSIS — I70262 Atherosclerosis of native arteries of extremities with gangrene, left leg: Secondary | ICD-10-CM

## 2021-01-16 DIAGNOSIS — Z01810 Encounter for preprocedural cardiovascular examination: Secondary | ICD-10-CM

## 2021-01-17 ENCOUNTER — Other Ambulatory Visit: Payer: Self-pay

## 2021-01-17 ENCOUNTER — Ambulatory Visit (INDEPENDENT_AMBULATORY_CARE_PROVIDER_SITE_OTHER): Payer: Medicare Other

## 2021-01-17 ENCOUNTER — Ambulatory Visit (INDEPENDENT_AMBULATORY_CARE_PROVIDER_SITE_OTHER): Payer: Medicare Other | Admitting: Nurse Practitioner

## 2021-01-17 VITALS — BP 130/62 | HR 62 | Ht 71.0 in | Wt 154.0 lb

## 2021-01-17 DIAGNOSIS — Z01810 Encounter for preprocedural cardiovascular examination: Secondary | ICD-10-CM | POA: Diagnosis not present

## 2021-01-17 DIAGNOSIS — Z720 Tobacco use: Secondary | ICD-10-CM | POA: Diagnosis not present

## 2021-01-17 DIAGNOSIS — I10 Essential (primary) hypertension: Secondary | ICD-10-CM

## 2021-01-17 DIAGNOSIS — I70262 Atherosclerosis of native arteries of extremities with gangrene, left leg: Secondary | ICD-10-CM | POA: Diagnosis not present

## 2021-01-17 DIAGNOSIS — E782 Mixed hyperlipidemia: Secondary | ICD-10-CM | POA: Diagnosis not present

## 2021-01-18 ENCOUNTER — Telehealth (INDEPENDENT_AMBULATORY_CARE_PROVIDER_SITE_OTHER): Payer: Self-pay

## 2021-01-18 MED ORDER — HYDROCODONE-ACETAMINOPHEN 5-325 MG PO TABS: 1.0000 | ORAL_TABLET | Freq: Four times a day (QID) | ORAL | 0 refills | Status: DC | PRN

## 2021-01-18 MED ORDER — GABAPENTIN 300 MG PO CAPS: 300.0000 mg | ORAL_CAPSULE | Freq: Every day | ORAL | 3 refills | Status: AC

## 2021-01-18 NOTE — Telephone Encounter (Signed)
Patient friend called in requesting a medication refill for the patient. Stated that she did contact the pharmacy and they let her know she needed to call the Dr office in order to get medication refilled   HYDROcodone-acetaminophen (NORCO) 5-325 MG tablet  gabapentin (NEURONTIN) 300 MG capsule  Please call and advise

## 2021-01-19 NOTE — Telephone Encounter (Signed)
sent 

## 2021-01-19 NOTE — Telephone Encounter (Signed)
Patient friend [name] was made aware the prescription was sent

## 2021-01-22 ENCOUNTER — Encounter (INDEPENDENT_AMBULATORY_CARE_PROVIDER_SITE_OTHER): Payer: Self-pay | Admitting: Nurse Practitioner

## 2021-01-22 NOTE — Progress Notes (Signed)
Subjective:    Patient ID: Samuel Carney, male    DOB: 06/01/1945, 75 y.o.   MRN: [id number] Chief Complaint  Patient presents with   Follow-up    2 wk [facility]  post LE angio . Saphenous vein mapping for bypass    Samuel Carney is a 75 year old male that presents today following left lower extremity angiogram.  The patient underwent angiogram due to occlusion in his left SFA and popliteal.  The patient has rest pain currently.  He has not developed any other wounds or ulcerations.  The patient does continue to smoke daily.  Today noninvasive studies mapping his left great saphenous vein shows good caliber compressible great saphenous vein.  Vein diameters range from 0.47 cm at the proximal thigh to 0.29 cm at the distal calf.   Review of Systems  Musculoskeletal:  Positive for gait problem.  Neurological:  Positive for weakness.  All other systems reviewed and are negative.     Objective:   Physical Exam Vitals reviewed.  HENT:     Head: Normocephalic.  Cardiovascular:     Rate and Rhythm: Normal rate.     Pulses:          Dorsalis pedis pulses are detected w/ Doppler on the left side.       Posterior tibial pulses are detected w/ Doppler on the left side.  Pulmonary:     Effort: Pulmonary effort is normal.  Skin:    General: Skin is dry.  Neurological:     Mental Status: He is alert and oriented to person, place, and time.     Gait: Gait abnormal.  Psychiatric:        Mood and Affect: Mood normal.        Behavior: Behavior normal.        Thought Content: Thought content normal.        Judgment: Judgment normal.    BP 130/62   Pulse 62   Ht 5\' 11"  (1.803 m)   Wt 154 lb (69.9 kg)   BMI 21.48 kg/m   Past Medical History:  Diagnosis Date   Alcohol abuse    Aortic atherosclerosis (HCC)    B12 deficiency    Cirrhosis (HCC)    COPD (chronic obstructive pulmonary disease) (HCC)    Coronary artery disease    GERD (gastroesophageal reflux disease)    Glaucoma    Grade  II diastolic dysfunction    History of kidney stones    HLD (hyperlipidemia)    Hx of radiation therapy    Hypertension    Lung mass    Moderate mitral regurgitation    Pneumonia    PVD (peripheral vascular disease) (HCC)    Squamous cell carcinoma of lung, right ([place]) 2019    Social History   Socioeconomic History   Marital status: Divorced    Spouse name: Not on file   Number of children: Not on file   Years of education: Not on file   Highest education level: Not on file  Occupational History   Not on file  Tobacco Use   Smoking status: Every Day    Packs/day: 1.00    Years: 50.00    Pack years: 50.00    Types: Cigarettes   Smokeless tobacco: Never  Vaping Use   Vaping Use: Never used  Substance and Sexual Activity   Alcohol use: Yes    Comment: "1 pint of liquor per day"   Drug use: Never   Sexual  activity: Yes  Other Topics Concern   Not on file  Social History Narrative   Live in girl friend   Social Determinants of Health   Financial Resource Strain: Not on file  Food Insecurity: Not on file  Transportation Needs: Not on file  Physical Activity: Not on file  Stress: Not on file  Social Connections: Not on file  Intimate Partner Violence: Not on file    Past Surgical History:  Procedure Laterality Date   COLONOSCOPY WITH PROPOFOL     COLONOSCOPY WITH PROPOFOL N/A 11/25/2018   Procedure: COLONOSCOPY WITH PROPOFOL;  Surgeon: Lollie Sails, MD;  Location: [facility] ENDOSCOPY;  Service: Endoscopy;  Laterality: N/A;   ENDARTERECTOMY FEMORAL Left 07/13/2020   Procedure: ENDARTERECTOMY FEMORAL ( SFA STENT);  Surgeon: Katha Cabal, MD;  Location: [facility] ORS;  Service: Vascular;  Laterality: Left;   ESOPHAGOGASTRODUODENOSCOPY (EGD) WITH PROPOFOL N/A 11/25/2018   Procedure: ESOPHAGOGASTRODUODENOSCOPY (EGD) WITH PROPOFOL;  Surgeon: Lollie Sails, MD;  Location: [facility] ENDOSCOPY;  Service: Endoscopy;  Laterality: N/A;   LOWER EXTREMITY ANGIOGRAPHY Left  06/07/2020   Procedure: LOWER EXTREMITY ANGIOGRAPHY;  Surgeon: Katha Cabal, MD;  Location: [place] CV LAB;  Service: Cardiovascular;  Laterality: Left;   LOWER EXTREMITY ANGIOGRAPHY Left 01/10/2021   Procedure: LOWER EXTREMITY ANGIOGRAPHY;  Surgeon: Katha Cabal, MD;  Location: [place] CV LAB;  Service: Cardiovascular;  Laterality: Left;    Family History  Problem Relation Age of Onset   Stroke Brother    Diabetes Mellitus II Brother    Diabetes Mellitus II Mother    Diabetes Mellitus II Maternal Uncle     No Known Allergies  CBC Latest Ref Rng & Units 12/13/2020 07/13/2020 07/13/2020  WBC 3.4 - 10.8 x10E3/uL 5.5 9.7 6.4  Hemoglobin 13.0 - 17.7 g/dL 11.4(L) 8.6(L) 8.9(L)  Hematocrit 37.5 - 51.0 % 35.3(L) 26.8(L) 28.6(L)  Platelets 150 - 450 x10E3/uL 132(L) 92(L) 100(L)      CMP     Component Value Date/Time   NA 141 12/13/2020 0841   NA 136 12/28/2012 0547   K 4.3 12/13/2020 0841   K 3.5 12/28/2012 0547   CL 109 (H) 12/13/2020 0841   CL 105 12/28/2012 0547   CO2 20 12/13/2020 0841   CO2 26 12/28/2012 0547   GLUCOSE 90 12/13/2020 0841   GLUCOSE 126 (H) 07/13/2020 2346   GLUCOSE 96 12/28/2012 0547   BUN 20 12/13/2020 0841   BUN 14 12/28/2012 0547   CREATININE 1.32 (H) 12/13/2020 0841   CREATININE 1.09 12/28/2012 0547   CALCIUM 9.5 12/13/2020 0841   CALCIUM 8.8 12/28/2012 0547   PROT 6.9 12/28/2012 0547   ALBUMIN 3.1 (L) 12/28/2012 0547   AST 32 12/28/2012 0547   ALT 16 12/28/2012 0547   ALKPHOS 91 12/28/2012 0547   BILITOT 1.0 12/28/2012 0547   GFRNONAA >60 07/13/2020 2346   GFRNONAA >60 12/28/2012 0547   GFRAA >60 12/28/2012 0547     No results found.     Assessment & Plan:   1. Atherosclerosis of native artery of left lower extremity with gangrene ([place])  Recommend:  The patient has evidence of severe atherosclerotic changes of both lower extremities associated with ulceration and tissue loss of the foot.  This represents a limb  threatening ischemia and places the patient at the risk for limb loss.  Angiography has been performed and the situation is not ideal for intervention.  Given this finding open surgical repair is recommended.   Patient should  undergo arterial reconstruction of the lower extremity with the hope for limb salvage.  Based on the size of the patient's vein mapping today, it is marginal for current use.  Because the bypass would be a femoral peroneal bypass, use of a graft is not possible.  I discussed with the patient that if during surgery it is felt that the patient does not have adequate vein for use, the next step would be amputation.     The risks and benefits as well as the alternative therapies was discussed in detail with the patient.  All questions were answered.  Patient agrees to proceed with bypass surgery.  The patient will follow up with me in the office after the procedure.     2. Tobacco abuse I had a long discussion with the patient in regards to cigarette smoking and bypasses.  I advised the patient that even with the bypass this too can become occluded with repeated injury from tobacco.  I discussed with patient and daughter that it is absolutely imperative that he stop smoking prior to his surgery.  Continued smoking will continue to put his lower extremity in a limb threatening situation.  3. Hyperlipidemia, mixed Continue statin as ordered and reviewed, no changes at this time   4. Benign essential hypertension Continue antihypertensive medications as already ordered, these medications have been reviewed and there are no changes at this time.    Current Outpatient Medications on File Prior to Visit  Medication Sig Dispense Refill   acetaminophen (TYLENOL) 500 MG tablet Take 1,000 mg by mouth every 6 (six) hours as needed for moderate pain or mild pain.     amLODipine (NORVASC) 10 MG tablet Take 1 tablet (10 mg total) by mouth daily. 90 tablet 3   aspirin EC 81 MG EC tablet  Take 1 tablet (81 mg total) by mouth daily at 6 (six) AM. Swallow whole. 90 tablet 3   clopidogrel (PLAVIX) 75 MG tablet Take 1 tablet (75 mg total) by mouth daily. 30 tablet 11   cyanocobalamin (,VITAMIN B-12,) 1000 MCG/ML injection Inject 1,000 mcg into the muscle every 30 (thirty) days.     dorzolamide-timolol (COSOPT) 22.3-6.8 MG/ML ophthalmic solution Place 1 drop into both eyes 2 (two) times daily.  6   doxazosin (CARDURA) 2 MG tablet Take 1 tablet (2 mg total) by mouth daily. 90 tablet 3   ibuprofen (ADVIL) 200 MG tablet Take 400 mg by mouth at bedtime.     metoprolol tartrate (LOPRESSOR) 100 MG tablet Take 1 tablet (100 mg total) by mouth 2 (two) times daily. 180 tablet 3   pantoprazole (PROTONIX) 40 MG tablet Take 40 mg by mouth daily.     potassium chloride SA (KLOR-CON) 20 MEQ tablet Take 20 mEq by mouth daily.     rosuvastatin (CRESTOR) 5 MG tablet Take 1 tablet (5 mg total) by mouth daily. 90 tablet 3   telmisartan (MICARDIS) 80 MG tablet Take 1 tablet (80 mg total) by mouth daily. 90 tablet 3   Travoprost, BAK Free, (TRAVATAN) 0.004 % SOLN ophthalmic solution Place 1 drop into both eyes at bedtime.     No current facility-administered medications on file prior to visit.    There are no Patient Instructions on file for this visit. No follow-ups on file.   Kris Hartmann, NP

## 2021-01-23 ENCOUNTER — Telehealth (INDEPENDENT_AMBULATORY_CARE_PROVIDER_SITE_OTHER): Payer: Self-pay

## 2021-01-23 ENCOUNTER — Other Ambulatory Visit: Payer: Self-pay

## 2021-01-23 ENCOUNTER — Ambulatory Visit: Payer: Medicare Other | Admitting: Physician Assistant

## 2021-01-23 ENCOUNTER — Encounter: Payer: Self-pay | Admitting: Physician Assistant

## 2021-01-23 VITALS — BP 130/76 | HR 54 | Ht 71.0 in | Wt 156.0 lb

## 2021-01-23 DIAGNOSIS — I25118 Atherosclerotic heart disease of native coronary artery with other forms of angina pectoris: Secondary | ICD-10-CM

## 2021-01-23 DIAGNOSIS — F1010 Alcohol abuse, uncomplicated: Secondary | ICD-10-CM

## 2021-01-23 DIAGNOSIS — Z01818 Encounter for other preprocedural examination: Secondary | ICD-10-CM | POA: Diagnosis not present

## 2021-01-23 DIAGNOSIS — R011 Cardiac murmur, unspecified: Secondary | ICD-10-CM | POA: Diagnosis not present

## 2021-01-23 DIAGNOSIS — R0600 Dyspnea, unspecified: Secondary | ICD-10-CM | POA: Diagnosis not present

## 2021-01-23 DIAGNOSIS — E782 Mixed hyperlipidemia: Secondary | ICD-10-CM

## 2021-01-23 DIAGNOSIS — C3411 Malignant neoplasm of upper lobe, right bronchus or lung: Secondary | ICD-10-CM

## 2021-01-23 NOTE — Progress Notes (Signed)
Office Visit    Patient Name: Samuel Carney Date of Encounter: 01/23/2021  PCP:  Juluis Pitch, MD   [place]  Cardiologist:  Ida Rogue, MD  Advanced Practice Provider:  No care team member to display Electrophysiologist:  None   :[phone]   Chief Complaint    Chief Complaint  Patient presents with   Hospitalization Follow-up    75 y.o. male with history of right-sided squamous cell lung carcinoma diagnosed in 09/2017 s/p radiation therapy, coronary artery calcification noted on CT imaging, PAD, ongoing tobacco use, COPD, alcoholic cirrhosis, N82 deficiency anemia, hypertension, Barrett's esophagus, GERD, thrombocytopenia, malignant 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, and who presents today for follow-up of coronary artery disease/coronary artery calcium.  Past Medical History    Past Medical History:  Diagnosis Date   Alcohol abuse    Aortic atherosclerosis (HCC)    B12 deficiency    Cirrhosis (HCC)    COPD (chronic obstructive pulmonary disease) (HCC)    Coronary artery disease    GERD (gastroesophageal reflux disease)    Glaucoma    Grade II diastolic dysfunction    History of kidney stones    HLD (hyperlipidemia)    Hx of radiation therapy    Hypertension    Lung mass    Moderate mitral regurgitation    Pneumonia    PVD (peripheral vascular disease) (HCC)    Squamous cell carcinoma of lung, right ([place]) 2019   Past Surgical History:  Procedure Laterality Date   COLONOSCOPY WITH PROPOFOL     COLONOSCOPY WITH PROPOFOL N/A 11/25/2018   Procedure: COLONOSCOPY WITH PROPOFOL;  Surgeon: Samuel Sails, MD;  Location: [facility] ENDOSCOPY;  Service: Endoscopy;  Laterality: N/A;   ENDARTERECTOMY FEMORAL Left 07/13/2020   Procedure: ENDARTERECTOMY FEMORAL ( SFA STENT);  Surgeon: Samuel Cabal, MD;  Location: [facility] ORS;  Service: Vascular;  Laterality: Left;   ESOPHAGOGASTRODUODENOSCOPY (EGD) WITH PROPOFOL N/A 11/25/2018    Procedure: ESOPHAGOGASTRODUODENOSCOPY (EGD) WITH PROPOFOL;  Surgeon: Samuel Sails, MD;  Location: [facility] ENDOSCOPY;  Service: Endoscopy;  Laterality: N/A;   LOWER EXTREMITY ANGIOGRAPHY Left 06/07/2020   Procedure: LOWER EXTREMITY ANGIOGRAPHY;  Surgeon: Samuel Cabal, MD;  Location: [place] CV LAB;  Service: Cardiovascular;  Laterality: Left;   LOWER EXTREMITY ANGIOGRAPHY Left 01/10/2021   Procedure: LOWER EXTREMITY ANGIOGRAPHY;  Surgeon: Samuel Cabal, MD;  Location: [place] CV LAB;  Service: Cardiovascular;  Laterality: Left;    Allergies  No Known Allergies  History of Present Illness    [name] is a 75 y.o. male with PMH as above.  He was initially evaluated 09/2017 for shortness of breath and incidentally noted coronary artery calcium on CT imaging during work-up for lung cancer.  Images showed multivessel moderate to heavy coronary artery calcification.  He noted some shortness of breath with exertion at that time.  He underwent MPI 10/23/2017 without significant ischemia and overall ruled a low risk study.  Echo 03/19/2019 showed EF 60 to 65%, mild LVH, G2 DD, NR WMA, moderate LAE, moderate MR, mild TR, mild to moderate aortic sclerosis, and mild to moderately elevated PASP. 06/22/2020 MPI also ruled low risk study without significant ischemia.  Since that time, he has been evaluated and intervened on by vascular surgery for PAD.  Today, 01/23/2021, he returns to clinic and notes that he needs preoperative clearance prior to his upcoming vascular surgery.  He reports dyspnea, worsened from his previous visits with records  indicating a history of chronic dyspnea.  He is able to walk 1/4 mile before getting short of breath.  He denies any shortness of breath at rest.  No chest pain at rest or with exertion.  He reports occasional bilateral lower extremity edema, attributed to his vascular disease.  He reports that he has difficulty sleeping at night, mainly because his  legs have paresthesias and get numb and swell.  He reports soreness of the big toe.  He denies any presyncope or syncope.  No recent falls.  He reports his appetite fair.  He is still smoking at 1/4 pack/day and reports drinking half quart whiskey at home.  He wants to quit smoking and drinking.  He is not checking his blood pressure at home.  He reports medication compliance.  Home Medications   Current Outpatient Medications  Medication Instructions   acetaminophen (TYLENOL) 1,000 mg, Oral, Every 6 hours PRN   amLODipine (NORVASC) 10 mg, Oral, Daily   aspirin 81 mg, Oral, Daily, Swallow whole.   clopidogrel (PLAVIX) 75 mg, Oral, Daily   cyanocobalamin ((VITAMIN B-12)) 1,000 mcg, Intramuscular, Every 30 days   dorzolamide-timolol (COSOPT) 22.3-6.8 MG/ML ophthalmic solution 1 drop, Both Eyes, 2 times daily   doxazosin (CARDURA) 2 mg, Oral, Daily   gabapentin (NEURONTIN) 300 mg, Oral, Daily at bedtime   HYDROcodone-acetaminophen (NORCO) 5-325 MG tablet 1 tablet, Oral, Every 6 hours PRN   ibuprofen (ADVIL) 400 mg, Oral, Daily at bedtime   metoprolol tartrate (LOPRESSOR) 100 mg, Oral, 2 times daily   pantoprazole (PROTONIX) 40 mg, Oral, Daily   potassium chloride SA (KLOR-CON) 20 MEQ tablet 20 mEq, Oral, Daily   rosuvastatin (CRESTOR) 5 mg, Oral, Daily   telmisartan (MICARDIS) 80 mg, Oral, Daily   Travoprost, BAK Free, (TRAVATAN) 0.004 % SOLN ophthalmic solution 1 drop, Both Eyes, Daily at bedtime     Review of Systems    He reports DOE and bilateral LE pain / parathesias that keeps him awake. He denies chest pain, palpitations,, pnd, orthopnea, n, v, dizziness, syncope, weight gain, or early satiety. .All other systems reviewed and are otherwise negative except as noted above.  Physical Exam    VS:  BP 130/76 (BP Location: Left Arm, Patient Position: Sitting, Cuff Size: Normal)   Pulse (!) 54   Ht 5\' 11"  (1.803 m)   Wt 156 lb (70.8 kg)   SpO2 98%   BMI 21.76 kg/m  , BMI Body mass  index is 21.76 kg/m. GEN: Well nourished, well developed, in no acute distress. Joined by his daughter.  HEENT: normal. Neck: Supple, no JVD, carotid bruits, or masses. Cardiac: RRR, 2/6 systolic murmur, rubs, or gallops. No clubbing, cyanosis. Trace bilateral edema with erythema and some scabbing noted of LLE.  Radials/DP/PT 2+ and equal bilaterally.  Respiratory:  Coarse breath sounds bilaterally, expiratory wheeze GI: Soft, nontender, nondistended, BS + x 4. MS: no deformity or atrophy. Skin: warm and dry, no rash. Neuro:  Strength and sensation are intact. Psych: Normal affect.  Accessory Clinical Findings    ECG personally reviewed by me today -sinus bradycardia, nonspecific T wave abnormality, no acute changes from 12/13/2020- no acute changes.  VITALS Reviewed today   Temp Readings from Last 3 Encounters:  01/10/21 98.6 F (37 C) (Oral)  07/14/20 97.7 F (36.5 C) (Oral)  06/07/20 98.2 F (36.8 C) (Oral)   BP Readings from Last 3 Encounters:  01/23/21 130/76  01/17/21 130/62  01/10/21 (!) 163/80   Pulse Readings from Last  3 Encounters:  01/23/21 (!) 54  01/17/21 62  01/10/21 68    Wt Readings from Last 3 Encounters:  01/23/21 156 lb (70.8 kg)  01/17/21 154 lb (69.9 kg)  01/10/21 151 lb (68.5 kg)     LABS  reviewed today   Lab Results  Component Value Date   WBC 5.5 12/13/2020   HGB 11.4 (L) 12/13/2020   HCT 35.3 (L) 12/13/2020   MCV 90 12/13/2020   PLT 132 (L) 12/13/2020   Lab Results  Component Value Date   CREATININE 1.32 (H) 12/13/2020   BUN 20 12/13/2020   NA 141 12/13/2020   K 4.3 12/13/2020   CL 109 (H) 12/13/2020   CO2 20 12/13/2020   Lab Results  Component Value Date   ALT 16 12/28/2012   AST 32 12/28/2012   ALKPHOS 91 12/28/2012   BILITOT 1.0 12/28/2012   No results found for: CHOL, HDL, LDLCALC, LDLDIRECT, TRIG, CHOLHDL  No results found for: HGBA1C Lab Results  Component Value Date   TSH 0.59 12/28/2012     STUDIES/PROCEDURES  reviewed today   MPI 06/22/20 The study is normal. This is a low risk study. The left ventricular ejection fraction is normal (68%). There is no evidence for ischemia  Echo 03/19/2019  1. Left ventricular ejection fraction, by visual estimation, is 60 to  65%. The left ventricle has normal function. There is mildly increased  left ventricular hypertrophy.   2. Left ventricular diastolic parameters are consistent with Grade II  diastolic dysfunction (pseudonormalization).   3. The left ventricle has no regional wall motion abnormalities.   4. Global right ventricle has normal systolic function.The right  ventricular size is normal. No increase in right ventricular wall  thickness.   5. Left atrial size was moderately dilated.   6. Moderate mitral valve regurgitation.   7. Tricuspid valve regurgitation is mild.   8. Mild to moderate aortic valve sclerosis/calcification without any  evidence of aortic stenosis.   9. Mild to Moderately elevated pulmonary artery systolic pressure.   Assessment & Plan    Preoperative cardiovascular assessment -- No chest pain.  Reports exertional dyspnea.  Calculated RCRI score 6.6% placing him at moderate risk for the upcoming procedure.  Given his report of dyspnea, will order echo.  Recent stress test as above.  Follow-up after echo.  Coronary artery calcium --Dyspnea initially noted during work-up for lung cancer.  Previous MPI without ischemia and overall ruled low risk.  He denies any chest pain with exertion or at rest.  He reports ongoing dyspnea, slightly worse from previous clinic visits.  Given his report of dyspnea, we will update an echo.  Risk factor modification and primary prevention including continuation of ASA and Crestor recommended.  If echo shows reduced EF or acute structural abnormalities, further ischemic work-up to be considered at that time.  Chronic dyspnea/G2DD --Reports dyspnea slightly worse from previous clinic visits.   Given his ongoing alcohol use and tobacco use, scheduled for outpatient echo.  Cannot exclude underlying COPD/lung cancer as contributing to his dyspnea.  Previous MPI low risk as above.  Essential hypertension --BP borderline.  Continue current medications.  Low-sodium diet recommended.  HLD --Continue statin.  Lung cancer/COPD/ongoing tobacco use --S/p radiation.  Tobacco use cessation recommended.  Continue to follow with oncology as recommended.  Alcoholic cirrhosis without ascites --Followed by GI.  Ongoing alcohol use.  Complete cessation recommended.   Disposition: RTC after echo  *Please be aware that the above documentation  was completed voice recognition software and may contain dictation errors.     Arvil Chaco, PA-C 01/23/2021

## 2021-01-23 NOTE — Telephone Encounter (Signed)
Pt's friend Letta Median called and left a Vm on the nurses line wanting to know if Samuel Carney could have a refill on his gabapentin . Per the pharmacy it is to early and the NP sent in his refill in 10/19 . Per the NP  the pt should speak with the pharmacy as to when they can refill it Letta Median was made aware of there Nps instructions.

## 2021-01-23 NOTE — Patient Instructions (Addendum)
Medication Instructions:  - Your physician recommends that you continue on your current medications as directed. Please refer to the Current Medication list given to you today.  *If you need a refill on your cardiac medications before your next appointment, please call your pharmacy*   Lab Work: - none ordered  If you have labs (blood work) drawn today and your tests are completely normal, you will receive your results only by: [place] (if you have MyChart) OR A paper copy in the mail If you have any lab test that is abnormal or we need to change your treatment, we will call you to review the results.   Testing/Procedures: - Your physician has requested that you have an echocardiogram. Echocardiography is a painless test that uses sound waves to create images of your heart. It provides your doctor with information about the size and shape of your heart and how well your heart's chambers and valves are working. This procedure takes approximately one hour. There are no restrictions for this procedure. There is a possibility that an IV may need to be started during your test to inject an image enhancing agent. This is done to obtain more optimal pictures of your heart. Therefore we ask that you do at least drink some water prior to coming in to hydrate your veins.     Follow-Up: At [facility], you and your health needs are our priority.  As part of our continuing mission to provide you with exceptional heart care, we have created designated Provider Care Teams.  These Care Teams include your primary Cardiologist (physician) and Advanced Practice Providers (APPs -  Physician Assistants and Nurse Practitioners) who all work together to provide you with the care you need, when you need it.  We recommend signing up for the patient portal called "MyChart".  Sign up information is provided on this After Visit Summary.  MyChart is used to connect with patients for Virtual Visits  (Telemedicine).  Patients are able to view lab/test results, encounter notes, upcoming appointments, etc.  Non-urgent messages can be sent to your provider as well.   To learn more about what you can do with MyChart, go to [email or web address].    Your next appointment:   After the echocardiogram is completed   The format for your next appointment:   In Person  Provider:   You may see Ida Rogue, MD or one of the following Advanced Practice Providers on your designated Care Team:   Murray Hodgkins, NP Christell Faith, PA-C Marrianne Mood, PA-C Cadence Kathlen Mody, [place]   Other Instructions - Smoking cessation advised. Advised call 1-800-QUIT-NOW (216) 420-3154) for help with quitting smoking.   Echocardiogram An echocardiogram is a test that uses sound waves (ultrasound) to produce images of the heart. Images from an echocardiogram can provide important information about: Heart size and shape. The size and thickness and movement of your heart's walls. Heart muscle function and strength. Heart valve function or if you have stenosis. Stenosis is when the heart valves are too narrow. If blood is flowing backward through the heart valves (regurgitation). A tumor or infectious growth around the heart valves. Areas of heart muscle that are not working well because of poor blood flow or injury from a heart attack. Aneurysm detection. An aneurysm is a weak or damaged part of an artery wall. The wall bulges out from the normal force of blood pumping through the body. Tell a health care provider about: Any allergies you have. All medicines you are taking,  including vitamins, herbs, eye drops, creams, and over-the-counter medicines. Any blood disorders you have. Any surgeries you have had. Any medical conditions you have. Whether you are pregnant or may be pregnant. What are the risks? Generally, this is a safe test. However, problems may occur, including an allergic reaction to dye  (contrast) that may be used during the test. What happens before the test? No specific preparation is needed. You may eat and drink normally. What happens during the test?  You will take off your clothes from the waist up and put on a hospital gown. Electrodes or electrocardiogram (ECG)patches may be placed on your chest. The electrodes or patches are then connected to a device that monitors your heart rate and rhythm. You will lie down on a table for an ultrasound exam. A gel will be applied to your chest to help sound waves pass through your skin. A handheld device, called a transducer, will be pressed against your chest and moved over your heart. The transducer produces sound waves that travel to your heart and bounce back (or "echo" back) to the transducer. These sound waves will be captured in real-time and changed into images of your heart that can be viewed on a video monitor. The images will be recorded on a computer and reviewed by your health care provider. You may be asked to change positions or hold your breath for a short time. This makes it easier to get different views or better views of your heart. In some cases, you may receive contrast through an IV in one of your veins. This can improve the quality of the pictures from your heart. The procedure may vary among health care providers and hospitals. What can I expect after the test? You may return to your normal, everyday life, including diet, activities, and medicines, unless your health care provider tells you not to do that. Follow these instructions at home: It is up to you to get the results of your test. Ask your health care provider, or the department that is doing the test, when your results will be ready. Keep all follow-up visits. This is important. Summary An echocardiogram is a test that uses sound waves (ultrasound) to produce images of the heart. Images from an echocardiogram can provide important information about the  size and shape of your heart, heart muscle function, heart valve function, and other possible heart problems. You do not need to do anything to prepare before this test. You may eat and drink normally. After the echocardiogram is completed, you may return to your normal, everyday life, unless your health care provider tells you not to do that. This information is not intended to replace advice given to you by your health care provider. Make sure you discuss any questions you have with your health care provider. Document Revised: 11/10/2019 Document Reviewed: 11/10/2019 Elsevier Patient Education  2022 [employer].

## 2021-01-26 ENCOUNTER — Ambulatory Visit (INDEPENDENT_AMBULATORY_CARE_PROVIDER_SITE_OTHER): Payer: Medicare Other | Admitting: Vascular Surgery

## 2021-01-26 ENCOUNTER — Encounter (INDEPENDENT_AMBULATORY_CARE_PROVIDER_SITE_OTHER): Payer: Medicare Other

## 2021-01-30 ENCOUNTER — Telehealth (INDEPENDENT_AMBULATORY_CARE_PROVIDER_SITE_OTHER): Payer: Self-pay

## 2021-01-30 ENCOUNTER — Other Ambulatory Visit (INDEPENDENT_AMBULATORY_CARE_PROVIDER_SITE_OTHER): Payer: Self-pay | Admitting: Nurse Practitioner

## 2021-01-30 MED ORDER — HYDROCODONE-ACETAMINOPHEN 5-325 MG PO TABS: 1.0000 | ORAL_TABLET | Freq: Four times a day (QID) | ORAL | 0 refills | Status: DC | PRN

## 2021-01-30 NOTE — Telephone Encounter (Signed)
Patient was made aware

## 2021-01-30 NOTE — Telephone Encounter (Signed)
sent 

## 2021-01-31 ENCOUNTER — Telehealth (INDEPENDENT_AMBULATORY_CARE_PROVIDER_SITE_OTHER): Payer: Self-pay

## 2021-01-31 NOTE — Telephone Encounter (Signed)
I called the patient to schedule him for a left femperioneal bypass surgery with possible amputation. Patient stated he was not doing a surgery with [place] he was going to [facility] to have his surgery.

## 2021-03-03 ENCOUNTER — Other Ambulatory Visit: Payer: Medicare Other

## 2021-03-06 ENCOUNTER — Ambulatory Visit: Payer: Medicare Other | Admitting: Cardiovascular Disease

## 2021-03-08 ENCOUNTER — Ambulatory Visit: Payer: Medicare Other | Admitting: Cardiovascular Disease

## 2021-04-06 ENCOUNTER — Other Ambulatory Visit: Payer: Medicare Other

## 2021-04-07 ENCOUNTER — Ambulatory Visit: Payer: Medicare Other | Admitting: Cardiovascular Disease

## 2021-04-24 NOTE — Progress Notes (Deleted)
MRN : [id number]  Samuel Carney is a 76 y.o. ([birth year]) male who presents with chief complaint of check circulation.  History of Present Illness:   The patient returns to the office for followup and review status post angiogram without intervention on 01/10/2021.   Patient has the possibility of a femoral to peroneal bypass but only if a vein is available.  This reconstruction would not be supported using prosthetic as the bypass conduit.  The mid lesion can be addressed at the time of surgery.  The patient notes improvement in the lower extremity symptoms. No interval shortening of the patient's claudication distance or rest pain symptoms. Previous wounds have now healed.  No new ulcers or wounds have occurred since the last visit.  There have been no significant changes to the patient's overall health care.  The patient denies amaurosis fugax or recent TIA symptoms. There are no recent neurological changes noted. The patient denies history of DVT, PE or superficial thrombophlebitis. The patient denies recent episodes of angina or shortness of breath.   ABI's Rt=*** and Lt=***  (previous ABI's Rt=*** and Lt=***) Duplex US of the *** lower extremity arterial system shows ***   No outpatient medications have been marked as taking for the 04/27/21 encounter (Appointment) with Delana Meyer, Dolores Lory, MD.    Past Medical History:  Diagnosis Date   Alcohol abuse    Aortic atherosclerosis ([place])    B12 deficiency    Cirrhosis ([place])    COPD (chronic obstructive pulmonary disease) ([place])    Coronary artery disease    GERD (gastroesophageal reflux disease)    Glaucoma    Grade II diastolic dysfunction    History of kidney stones    HLD (hyperlipidemia)    Hx of radiation therapy    Hypertension    Lung mass    Moderate mitral regurgitation    Pneumonia    PVD (peripheral vascular disease) ([facility])    Squamous cell carcinoma of lung, right ([place]) 2019    Past Surgical History:   Procedure Laterality Date   COLONOSCOPY WITH PROPOFOL     COLONOSCOPY WITH PROPOFOL N/A 11/25/2018   Procedure: COLONOSCOPY WITH PROPOFOL;  Surgeon: Lollie Sails, MD;  Location: [facility];  Service: Endoscopy;  Laterality: N/A;   ENDARTERECTOMY FEMORAL Left 07/13/2020   Procedure: ENDARTERECTOMY FEMORAL ( SFA STENT);  Surgeon: Katha Cabal, MD;  Location: [facility] ORS;  Service: Vascular;  Laterality: Left;   ESOPHAGOGASTRODUODENOSCOPY (EGD) WITH PROPOFOL N/A 11/25/2018   Procedure: ESOPHAGOGASTRODUODENOSCOPY (EGD) WITH PROPOFOL;  Surgeon: Lollie Sails, MD;  Location[facility]COPY;  Service: Endoscopy;  Laterality: N/A;   LOWER EXTREMITY ANGIOGRAPHY Left 06/07/2020   Procedure: LOWER EXTREMITY ANGIOGRAPHY;  Surgeon: Katha Cabal, MD;  Location: [place] CV LAB;  Service: Cardiovascular;  Laterality: Left;   LOWER EXTREMITY ANGIOGRAPHY Left 01/10/2021   Procedure: LOWER EXTREMITY ANGIOGRAPHY;  Surgeon: Katha Cabal, MD;  Location: [place] [facility];  Service: Cardiovascular;  Laterality: Left;    Social History Social History   Tobacco Use   Smoking status: Every Day    Packs/day: 1.00    Years: 50.00    Pack years: 50.00    Types: Cigarettes   Smokeless tobacco: Never   Tobacco comments:    01/23/21 cut back to about 3/4 of a pack per day per patient  Vaping Use   Vaping Use: Never used  Substance Use Topics   Alcohol use: Yes    Comment: "1 pint  of liquor per day"; 01/23/21 about 6oz per day   Drug use: Never    Family History Family History  Problem Relation Age of Onset   Diabetes Mellitus II Mother    Stroke Brother    Diabetes Mellitus II Brother    Diabetes Mellitus II Maternal Uncle     No Known Allergies   REVIEW OF SYSTEMS (Negative unless checked)  Constitutional: [] Weight loss  [] Fever  [] Chills Cardiac: [] Chest pain   [] Chest pressure   [] Palpitations   [] Shortness of breath when laying flat   [] Shortness of breath  with exertion. Vascular:  [] Pain in legs with walking   [] Pain in legs at rest  [] History of DVT   [] Phlebitis   [] Swelling in legs   [] Varicose veins   [] Non-healing ulcers Pulmonary:   [] Uses home oxygen   [] Productive cough   [] Hemoptysis   [] Wheeze  [] COPD   [] Asthma Neurologic:  [] Dizziness   [] Seizures   [] History of stroke   [] History of TIA  [] Aphasia   [] Vissual changes   [] Weakness or numbness in arm   [] Weakness or numbness in leg Musculoskeletal:   [] Joint swelling   [] Joint pain   [] Low back pain Hematologic:  [] Easy bruising  [] Easy bleeding   [] Hypercoagulable state   [] Anemic Gastrointestinal:  [] Diarrhea   [] Vomiting  [] Gastroesophageal reflux/heartburn   [] Difficulty swallowing. Genitourinary:  [] Chronic kidney disease   [] Difficult urination  [] Frequent urination   [] Blood in urine Skin:  [] Rashes   [] Ulcers  Psychological:  [] History of anxiety   []  History of major depression.  Physical Examination  There were no vitals filed for this visit. There is no height or weight on file to calculate BMI. Gen: WD/WN, NAD Head: [place]/AT, No temporalis wasting.  Ear/Nose/Throat: Hearing grossly intact, nares w/o erythema or drainage Eyes: PER, EOMI, sclera nonicteric.  Neck: Supple, no masses.  No bruit or JVD.  Pulmonary:  Good air movement, no audible wheezing, no use of accessory muscles.  Cardiac: RRR, normal S1, S2, no Murmurs. Vascular:  *** Vessel Right Left  Radial Palpable Palpable  Carotid Palpable Palpable  PT Palpable Palpable  DP Palpable Palpable  Gastrointestinal: soft, non-distended. No guarding/no peritoneal signs.  Musculoskeletal: M/S 5/5 throughout.  No visible deformity.  Neurologic: CN 2-12 intact. Pain and light touch intact in extremities.  Symmetrical.  Speech is fluent. Motor exam as listed above. Psychiatric: Judgment intact, Mood & affect appropriate for pt's clinical situation. Dermatologic: No rashes or ulcers noted.  No changes consistent with  cellulitis.   CBC Lab Results  Component Value Date   WBC 5.5 12/13/2020   HGB 11.4 (L) 12/13/2020   HCT 35.3 (L) 12/13/2020   MCV 90 12/13/2020   PLT 132 (L) 12/13/2020    BMET    Component Value Date/Time   NA 141 12/13/2020 0841   NA 136 12/28/2012 0547   K 4.3 12/13/2020 0841   K 3.5 12/28/2012 0547   CL 109 (H) 12/13/2020 0841   CL 105 12/28/2012 0547   CO2 20 12/13/2020 0841   CO2 26 12/28/2012 0547   GLUCOSE 90 12/13/2020 0841   GLUCOSE 126 (H) 07/13/2020 2346   GLUCOSE 96 12/28/2012 0547   BUN 20 12/13/2020 0841   BUN 14 12/28/2012 0547   CREATININE 1.32 (H) 12/13/2020 0841   CREATININE 1.09 12/28/2012 0547   CALCIUM 9.5 12/13/2020 0841   CALCIUM 8.8 12/28/2012 0547   GFRNONAA >60 07/13/2020 2346   GFRNONAA >60 12/28/2012 0547  GFRAA >60 12/28/2012 0547   CrCl cannot be calculated (Patient's most recent lab result is older than the maximum 21 days allowed.).  COAG Lab Results  Component Value Date   INR 1.1 07/11/2020   INR 0.92 10/14/2017   INR 0.9 12/26/2012    Radiology No results found.   Assessment/Plan There are no diagnoses linked to this encounter.   [name], MD  04/24/2021 11:23 AM

## 2021-04-27 ENCOUNTER — Encounter (INDEPENDENT_AMBULATORY_CARE_PROVIDER_SITE_OTHER): Payer: Medicare Other

## 2021-04-27 ENCOUNTER — Ambulatory Visit (INDEPENDENT_AMBULATORY_CARE_PROVIDER_SITE_OTHER): Payer: Medicare Other | Admitting: Vascular Surgery

## 2021-04-27 DIAGNOSIS — I25118 Atherosclerotic heart disease of native coronary artery with other forms of angina pectoris: Secondary | ICD-10-CM

## 2021-04-27 DIAGNOSIS — I10 Essential (primary) hypertension: Secondary | ICD-10-CM

## 2021-04-27 DIAGNOSIS — E782 Mixed hyperlipidemia: Secondary | ICD-10-CM

## 2021-04-27 DIAGNOSIS — I7025 Atherosclerosis of native arteries of other extremities with ulceration: Secondary | ICD-10-CM

## 2021-05-19 ENCOUNTER — Other Ambulatory Visit: Payer: Self-pay

## 2021-05-19 ENCOUNTER — Ambulatory Visit (INDEPENDENT_AMBULATORY_CARE_PROVIDER_SITE_OTHER): Payer: Medicare Other

## 2021-05-19 DIAGNOSIS — R0600 Dyspnea, unspecified: Secondary | ICD-10-CM | POA: Diagnosis not present

## 2021-05-19 DIAGNOSIS — R011 Cardiac murmur, unspecified: Secondary | ICD-10-CM | POA: Diagnosis not present

## 2021-05-19 DIAGNOSIS — Z01818 Encounter for other preprocedural examination: Secondary | ICD-10-CM

## 2021-05-19 LAB — ECHOCARDIOGRAM COMPLETE
AR max vel: 2.58 cm2
AV Area VTI: 2.66 cm2
AV Area mean vel: 2.52 cm2
AV Mean grad: 7 mmHg
AV Peak grad: 12.8 mmHg
Ao pk vel: 1.79 m/s
Area-P 1/2: 2.83 cm2
Calc EF: 59.3 %
Single Plane A2C EF: 56.5 %
Single Plane A4C EF: 62.8 %

## 2021-05-22 ENCOUNTER — Other Ambulatory Visit: Payer: Self-pay

## 2021-05-22 ENCOUNTER — Encounter: Payer: Self-pay | Admitting: Medical

## 2021-05-22 ENCOUNTER — Ambulatory Visit: Payer: Medicare Other | Admitting: Medical

## 2021-05-22 VITALS — BP 106/50 | HR 59 | Ht 67.0 in | Wt 145.0 lb

## 2021-05-22 DIAGNOSIS — I10 Essential (primary) hypertension: Secondary | ICD-10-CM

## 2021-05-22 DIAGNOSIS — I2510 Atherosclerotic heart disease of native coronary artery without angina pectoris: Secondary | ICD-10-CM

## 2021-05-22 DIAGNOSIS — R0600 Dyspnea, unspecified: Secondary | ICD-10-CM

## 2021-05-22 DIAGNOSIS — E782 Mixed hyperlipidemia: Secondary | ICD-10-CM

## 2021-05-22 DIAGNOSIS — Z720 Tobacco use: Secondary | ICD-10-CM | POA: Diagnosis not present

## 2021-05-22 DIAGNOSIS — J449 Chronic obstructive pulmonary disease, unspecified: Secondary | ICD-10-CM

## 2021-05-22 NOTE — Patient Instructions (Signed)
Medication Instructions:   Your physician recommends that you continue on your current medications as directed. Please refer to the Current Medication list given to you today.   *If you need a refill on your cardiac medications before your next appointment, please call your pharmacy*   Lab Work: None ordered  If you have labs (blood work) drawn today and your tests are completely normal, you will receive your results only by: [place] (if you have MyChart) OR A paper copy in the mail If you have any lab test that is abnormal or we need to change your treatment, we will call you to review the results.   Testing/Procedures: None ordered   Follow-Up: At [facility], you and your health needs are our priority.  As part of our continuing mission to provide you with exceptional heart care, we have created designated Provider Care Teams.  These Care Teams include your primary Cardiologist (physician) and Advanced Practice Providers (APPs -  Physician Assistants and Nurse Practitioners) who all work together to provide you with the care you need, when you need it.  We recommend signing up for the patient portal called "MyChart".  Sign up information is provided on this After Visit Summary.  MyChart is used to connect with patients for Virtual Visits (Telemedicine).  Patients are able to view lab/test results, encounter notes, upcoming appointments, etc.  Non-urgent messages can be sent to your provider as well.   To learn more about what you can do with MyChart, go to [email or web address].    Your next appointment:   6 month(s)  The format for your next appointment:   In Person  Provider:   You may see Ida Rogue, MD or one of the following Advanced Practice Providers on your designated Care Team:   Murray Hodgkins, NP Christell Faith, PA-C Cadence Kathlen Mody, [place]   Other Instructions N/A

## 2021-05-22 NOTE — Progress Notes (Signed)
Cardiology Office Note:    Date:  05/22/2021   ID:  Samuel Carney, DOB 21-May-1945, MRN [id number]  PCP:  Samuel Pitch, MD  [facility] Cardiologist:  Samuel Rogue, MD  [place] Electrophysiologist:  None   Referring MD: Samuel Pitch, MD   Chief Complaint: Echo follow-up  History of Present Illness:    Samuel Carney is a 76 y.o. male with a hx of with history of right-sided squamous cell lung carcinoma diagnosed in 09/2017 s/p radiation therapy, coronary artery calcification noted on CT imaging, PAD, ongoing tobacco use, COPD, alcoholic cirrhosis, I14 deficiency anemia, hypertension, Barrett's esophagus, GERD, thrombocytopenia, malignant 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, and who presents today for follow-up of coronary artery disease/coronary artery calcium.    He was initially evaluated 09/2017 for shortness of breath and incidentally noted coronary artery calcium on CT imaging during work-up for lung cancer.  Images showed multivessel moderate to heavy coronary artery calcification.  He noted some shortness of breath with exertion at that time.  He underwent MPI 10/23/2017 without significant ischemia and overall ruled a low risk study.  Echo 03/19/2019 showed EF 60 to 65%, mild LVH, G2 DD, NR WMA, moderate LAE, moderate MR, mild TR, mild to moderate aortic sclerosis, and mild to moderately elevated PASP. 06/22/2020 MPI also ruled low risk study without significant ischemia.  Since that time, he has been evaluated and intervened on by vascular surgery for PAD.  He was last seen 01/23/21 for pre-op clearance for his vascular surgery. Echo was ordered.   Echo showed LVEF 60-65%, no WMA, moderate LVH, G2DD, normal EV function, mildly dilated left atrium, mild MR.   Today, the patient reports he has been doing OK. Since the last visit he had his left leg amputated. No concerns. Echo was reviewed. The patient denies chest pain or SOB. No LLE, orthopnea, pnd, lightheadedness or  dizziness. Patient is still smoking, 1/2 ppd. He is taking Chantix daily and trying to cut back. Patient reports he stopped drinking alcohol 6 months ago.  Past Medical History:  Diagnosis Date   Alcohol abuse    Aortic atherosclerosis (HCC)    B12 deficiency    Cirrhosis (HCC)    COPD (chronic obstructive pulmonary disease) (HCC)    Coronary artery disease    GERD (gastroesophageal reflux disease)    Glaucoma    Grade II diastolic dysfunction    History of kidney stones    HLD (hyperlipidemia)    Hx of radiation therapy    Hypertension    Lung mass    Moderate mitral regurgitation    Pneumonia    PVD (peripheral vascular disease) (HCC)    Squamous cell carcinoma of lung, right ([place]) 2019    Past Surgical History:  Procedure Laterality Date   COLONOSCOPY WITH PROPOFOL     COLONOSCOPY WITH PROPOFOL N/A 11/25/2018   Procedure: COLONOSCOPY WITH PROPOFOL;  Surgeon: Samuel Sails, MD;  Location: [facility] ENDOSCOPY;  Service: Endoscopy;  Laterality: N/A;   ENDARTERECTOMY FEMORAL Left 07/13/2020   Procedure: ENDARTERECTOMY FEMORAL ( SFA STENT);  Surgeon: Samuel Cabal, MD;  Location: [facility] ORS;  Service: Vascular;  Laterality: Left;   ESOPHAGOGASTRODUODENOSCOPY (EGD) WITH PROPOFOL N/A 11/25/2018   Procedure: ESOPHAGOGASTRODUODENOSCOPY (EGD) WITH PROPOFOL;  Surgeon: Samuel Sails, MD;  Location: [facility] ENDOSCOPY;  Service: Endoscopy;  Laterality: N/A;   LEG AMPUTATION ABOVE KNEE Left    LOWER EXTREMITY ANGIOGRAPHY Left 06/07/2020   Procedure: LOWER EXTREMITY ANGIOGRAPHY;  Surgeon: Samuel Carney  G, MD;  Location: [facility] CV LAB;  Service: Cardiovascular;  Laterality: Left;   LOWER EXTREMITY ANGIOGRAPHY Left 01/10/2021   Procedure: LOWER EXTREMITY ANGIOGRAPHY;  Surgeon: Samuel Cabal, MD;  Location: [place] CV LAB;  Service: Cardiovascular;  Laterality: Left;    Current Medications: Current Meds  Medication Sig   acetaminophen (TYLENOL) 500 MG tablet Take  1,000 mg by mouth every 6 (six) hours as needed for moderate pain or mild pain.   amLODipine (NORVASC) 10 MG tablet Take 1 tablet (10 mg total) by mouth daily.   aspirin EC 81 MG EC tablet Take 1 tablet (81 mg total) by mouth daily at 6 (six) AM. Swallow whole.   clopidogrel (PLAVIX) 75 MG tablet Take 1 tablet (75 mg total) by mouth daily.   cyanocobalamin (,VITAMIN B-12,) 1000 MCG/ML injection Inject 1,000 mcg into the muscle every 30 (thirty) days.   dorzolamide-timolol (COSOPT) 22.3-6.8 MG/ML ophthalmic solution Place 1 drop into both eyes 2 (two) times daily.   doxazosin (CARDURA) 2 MG tablet Take 1 tablet (2 mg total) by mouth daily.   gabapentin (NEURONTIN) 300 MG capsule Take 1 capsule (300 mg total) by mouth at bedtime.   metoprolol tartrate (LOPRESSOR) 100 MG tablet Take 1 tablet (100 mg total) by mouth 2 (two) times daily.   pantoprazole (PROTONIX) 40 MG tablet Take 40 mg by mouth daily.   rosuvastatin (CRESTOR) 5 MG tablet Take 1 tablet (5 mg total) by mouth daily.   telmisartan (MICARDIS) 80 MG tablet Take 1 tablet (80 mg total) by mouth daily.   Travoprost, BAK Free, (TRAVATAN) 0.004 % SOLN ophthalmic solution Place 1 drop into both eyes at bedtime.   varenicline (CHANTIX) 1 MG tablet Take 1 mg by mouth daily.     Allergies:   Patient has no known allergies.   Social History   Socioeconomic History   Marital status: Divorced    Spouse name: Not on file   Number of children: Not on file   Years of education: Not on file   Highest education level: Not on file  Occupational History   Not on file  Tobacco Use   Smoking status: Every Day    Packs/day: 1.00    Years: 50.00    Pack years: 50.00    Types: Cigarettes   Smokeless tobacco: Never   Tobacco comments:    01/23/21 cut back to about 3/4 of a pack per day per patient     05/22/2021 half pack a day  Vaping Use   Vaping Use: Never used  Substance and Sexual Activity   Alcohol use: Not Currently    Comment: "1 pint  of liquor per day"; 01/23/21 about 6oz per day   Drug use: Never   Sexual activity: Yes  Other Topics Concern   Not on file  Social History Narrative   Live in girl friend   Social Determinants of Health   Financial Resource Strain: Not on file  Food Insecurity: Not on file  Transportation Needs: Not on file  Physical Activity: Not on file  Stress: Not on file  Social Connections: Not on file     Family History: The patient's family history includes Diabetes Mellitus II in his brother, maternal uncle, and mother; Stroke in his brother.  ROS:   Please see the history of present illness.     All other systems reviewed and are negative.  EKGs/Labs/Other Studies Reviewed:    The following studies were reviewed today:  MPI  06/22/20 The study is normal. This is a low risk study. The left ventricular ejection fraction is normal (68%). There is no evidence for ischemia   Echo 03/19/2019  1. Left ventricular ejection fraction, by visual estimation, is 60 to  65%. The left ventricle has normal function. There is mildly increased  left ventricular hypertrophy.   2. Left ventricular diastolic parameters are consistent with Grade II  diastolic dysfunction (pseudonormalization).   3. The left ventricle has no regional wall motion abnormalities.   4. Global right ventricle has normal systolic function.The right  ventricular size is normal. No increase in right ventricular wall  thickness.   5. Left atrial size was moderately dilated.   6. Moderate mitral valve regurgitation.   7. Tricuspid valve regurgitation is mild.   8. Mild to moderate aortic valve sclerosis/calcification without any  evidence of aortic stenosis.   9. Mild to Moderately elevated pulmonary artery systolic pressure.   EKG:  EKG is not ordered today.    Recent Labs: 12/13/2020: BUN 20; Creatinine, Ser 1.32; Hemoglobin 11.4; Platelets 132; Potassium 4.3; Sodium 141  Recent Lipid Panel No results found for:  CHOL, TRIG, HDL, CHOLHDL, VLDL, LDLCALC, LDLDIRECT    Physical Exam:    VS:  BP (!) 106/50 (BP Location: Right Arm, Patient Position: Sitting, Cuff Size: Normal)    Pulse (!) 59    Ht 5\' 7"  (1.702 m)    Wt 145 lb (65.8 kg)    SpO2 98%    BMI 22.71 kg/m     Wt Readings from Last 3 Encounters:  05/22/21 145 lb (65.8 kg)  01/23/21 156 lb (70.8 kg)  01/17/21 154 lb (69.9 kg)     GEN:  Well nourished, well developed in no acute distress HEENT: Normal NECK: No JVD; No carotid bruits LYMPHATICS: No lymphadenopathy CARDIAC: RRR, no murmurs, rubs, gallops RESPIRATORY:  Clear to auscultation without rales, wheezing or rhonchi  ABDOMEN: Soft, non-tender, non-distended MUSCULOSKELETAL:  No edema; No deformity  SKIN: Warm and dry NEUROLOGIC:  Alert and oriented x 3 PSYCHIATRIC:  Normal affect   ASSESSMENT:    1. Coronary artery calcification seen on CT scan   2. Hyperlipidemia, mixed   3. Essential hypertension   4. Tobacco abuse   5. Chronic obstructive pulmonary disease, unspecified COPD type ([place])   6. Dyspnea, unspecified type    PLAN:    In order of problems listed above:  Coronary artery calcium Previous MPI without ischemia and overall low risk. Most recent echo 05/19/21 for dyspnea showed LVEF 60-65%, no WMA, mod LVH, G2DD, normal RV function, mild MR. Patient denies anginal symptoms. No further ischemic work-up indicated at this time. Continue Aspirin, Plavix, Lopressor and statin.   HTN BP is soft today. Continue amlodipine 10mg  daily, Cardura 2mg  daily, Lopressor 100mg  BID, Telmisartan 80mg  daily. If BP were to drop or he were to feel lightheaded, recommended he call the office.   HLD LDL 47 01/2021. Continue Crestor.  Chronic dyspnea/G2DD Lung cancer/COPD/tobacco use Breathing is unchanged. He is still smoking 1/2 ppd, wanting to quit. He is euvolemic on exam. Follows with oncology for Lung cancer management.   H/o of alcohol abuse Patient reports he has not had a  drink in 6 months.   PAD s/p left AKA Following with vascular surgery. Continue ASA, Plavix, and statin   Disposition: Follow up in 6 month(s) with MD/APP      Signed, Journii Nierman Ninfa Meeker, PA-C  05/22/2021 9:51 AM    [facility]

## 2021-07-24 ENCOUNTER — Other Ambulatory Visit: Payer: Self-pay

## 2021-07-24 ENCOUNTER — Ambulatory Visit: Payer: Medicare Other | Admitting: Physical Therapy

## 2021-07-24 ENCOUNTER — Encounter: Payer: Self-pay | Admitting: Physical Therapy

## 2021-07-24 DIAGNOSIS — R2681 Unsteadiness on feet: Secondary | ICD-10-CM | POA: Diagnosis not present

## 2021-07-24 DIAGNOSIS — M6281 Muscle weakness (generalized): Secondary | ICD-10-CM | POA: Diagnosis not present

## 2021-07-24 DIAGNOSIS — R2689 Other abnormalities of gait and mobility: Secondary | ICD-10-CM | POA: Diagnosis not present

## 2021-07-24 DIAGNOSIS — R293 Abnormal posture: Secondary | ICD-10-CM

## 2021-07-24 DIAGNOSIS — M25652 Stiffness of left hip, not elsewhere classified: Secondary | ICD-10-CM

## 2021-07-24 NOTE — Therapy (Signed)
? ?OUTPATIENT PHYSICAL THERAPY PROSTHETICS EVALUATION ? ? ?Patient Name: Samuel Carney ?MRN: [id number] ?DOB:1945/08/31, 76 y.o., male ?Today's Date: 07/24/2021 ? ?PCP: Juluis Pitch, MD ?REFERRING PROVIDER: Jamse Arn, MD ? ? PT End of Session - 07/24/21 1019   ? ? Visit Number 1   ? Number of Visits 25   ? Date for PT Re-Evaluation 10/19/21   ? Authorization Type [employer] Medicare   $20 co-pay   ? Progress Note Due on Visit 10   ? PT Start Time [phone]   ? PT Stop Time 0926   ? PT Time Calculation (min) 42 min   ? ?  ?  ? ?  ? ? ?Past Medical History:  ?Diagnosis Date  ? Alcohol abuse   ? Aortic atherosclerosis ([place])   ? B12 deficiency   ? Cirrhosis ([place])   ? COPD (chronic obstructive pulmonary disease) ([place])   ? Coronary artery disease   ? GERD (gastroesophageal reflux disease)   ? Glaucoma   ? Grade II diastolic dysfunction   ? History of kidney stones   ? HLD (hyperlipidemia)   ? Hx of radiation therapy   ? Hypertension   ? Lung mass   ? Moderate mitral regurgitation   ? Pneumonia   ? PVD (peripheral vascular disease) ([place])   ? Squamous cell carcinoma of lung, right ([place]) 2019  ? ?Past Surgical History:  ?Procedure Laterality Date  ? COLONOSCOPY WITH PROPOFOL    ? COLONOSCOPY WITH PROPOFOL N/A 11/25/2018  ? Procedure: COLONOSCOPY WITH PROPOFOL;  Surgeon: Lollie Sails, MD;  Location: [facility] ENDOSCOPY;  Service: Endoscopy;  Laterality: N/A;  ? ENDARTERECTOMY FEMORAL Left 07/13/2020  ? Procedure: ENDARTERECTOMY FEMORAL ( SFA STENT);  Surgeon: Katha Cabal, MD;  Location: [facility] ORS;  Service: Vascular;  Laterality: Left;  ? ESOPHAGOGASTRODUODENOSCOPY (EGD) WITH PROPOFOL N/A 11/25/2018  ? Procedure: ESOPHAGOGASTRODUODENOSCOPY (EGD) WITH PROPOFOL;  Surgeon: Lollie Sails, MD;  Location: [facility] ENDOSCOPY;  Service: Endoscopy;  Laterality: N/A;  ? LEG AMPUTATION ABOVE KNEE Left   ? LOWER EXTREMITY ANGIOGRAPHY Left 06/07/2020  ? Procedure: LOWER EXTREMITY ANGIOGRAPHY;  Surgeon: Katha Cabal, MD;   Location: [place] CV LAB;  Service: Cardiovascular;  Laterality: Left;  ? LOWER EXTREMITY ANGIOGRAPHY Left 01/10/2021  ? Procedure: LOWER EXTREMITY ANGIOGRAPHY;  Surgeon: Katha Cabal, MD;  Location: [place] CV LAB;  Service: Cardiovascular;  Laterality: Left;  ? ?Patient Active Problem List  ? Diagnosis Date Noted  ? Atherosclerosis of artery of extremity with ulceration ([place]) 07/13/2020  ? Atherosclerosis of native arteries of the extremities with ulceration ([place]) 05/19/2020  ? Alcoholic cirrhosis of liver without ascites ([place]) 04/09/2019  ? Hyperlipidemia, mixed 04/09/2019  ? Malignant 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 ([place]) 04/09/2019  ? Thrombocytopenia ([place]) 04/09/2019  ? CAD (coronary artery disease) 10/14/2017  ? Lung cancer ([place]) 10/14/2017  ? Nodule of upper lobe of right lung 09/15/2017  ? Tobacco abuse 08/14/2017  ? Benign essential hypertension 09/09/2013  ? ? ?ONSET DATE:  07/18/2021 MD referral to PT & 07/27/2021 prosthesis delivery ? ?REFERRING DIAG: T61.443 S/P AKA unilateral, left   I70.229 critical lower limg ischemia ? ?THERAPY DIAG:  ?Unsteadiness on feet ? ?Other abnormalities of gait and mobility ? ?Stiffness of left hip, not elsewhere classified ? ?Muscle weakness (generalized) ? ?Abnormal posture ? ?SUBJECTIVE:  ? ?SUBJECTIVE STATEMENT: ?This 76yo male underwent a 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 He is getting prosthesis this Thursday, 05/29/2021.  He has been doing some exercises at home.  He is able to hop with walker in home and w/c in community.  ?Pt accompanied by: family members ? ?PERTINENT HISTORY: PAD, CAD, angina, malignant neoplasm of right lung, HLD, alcoholic cirrhosis of liver, HTN ? ?PAIN:  ?Are you having pain? No ? ?PRECAUTIONS: Fall ? ?WEIGHT BEARING RESTRICTIONS No ? ?FALLS: Has patient fallen in last 6 months? No ? ?LIVING ENVIRONMENT: ?Lives with: lives with their spouse ?Lives in: House/apartment ?Home Access: Stairs  to enter and Ramped entrance   ?Home layout: One level and single step into washer/dryer ?Stairs: Yes: External: 2 steps; none ?Has following equipment at home: Single point cane, Walker - 2 wheeled, Crutches, Wheelchair (manual), and shower chair ? ?PLOF: Independent, Independent with household mobility without device, and Independent with community mobility without device ? ?PATIENT GOALS to use prosthesis to ambulate in community, to attend church,  ? ?OBJECTIVE:  ? ?COGNITION: ?Overall cognitive status: Within functional limits for tasks assessed ? ?MUSCLE LENGTH: ?Hamstrings: Right -19 deg; with hip 90* ?Thomas test: Right -19 deg; Left -27 deg ? ?POSTURE: rounded shoulders, forward head, flexed trunk , and weight shift right ? ?LE ROM: ? ?Active ROM Right ?07/24/2021 Left ?07/24/2021  ?Hip flexion    ?Hip extension Standing -20* Standing  ?-30*  ?Hip abduction    ?Hip adduction    ?Hip internal rotation    ?Hip external rotation    ?Knee flexion    ?Knee extension    ?Ankle dorsiflexion    ?Ankle plantarflexion    ?Ankle inversion    ?Ankle eversion    ? (Blank rows = not tested) ? ?MMT: ? ?MMT Right ?07/24/2021 Left ?07/24/2021  ?Hip flexion 5/5 4/4  ?Hip extension 3/5 3-/5  ?Hip abduction 3/5 3-/5  ?Hip adduction    ?Hip internal rotation    ?Hip external rotation    ?Knee flexion 3/5 NA  ?Knee extension 4/5 NA  ?Ankle dorsiflexion 5/5 NA  ?Ankle plantarflexion  NA  ?Ankle inversion  NA  ?Ankle eversion  NA  ?(Blank rows = not tested) ? ?BED MOBILITY:  ?Sit to supine Modified independence ?Supine to sit Modified independence ? ?TRANSFERS: ?Sit to stand: Modified independence and requires armrests & use of BUEs ?Stand to sit: Modified independence and requires armrests & use of BUEs ? ?GAIT: ?Gait pattern: hopping with good clearance RLE ?Distance walked: 35' ?Assistive device utilized: Environmental consultant - 2 wheeled ?Level of assistance: Modified independence ?Gait velocity:  ?Comments:  ? ?FUNCTIONAL TESTs:  ?Berg Balance  Scale:   ? ?CARDIOVASCULAR RESPONSE: ?Functional activity: gait hopping with RW ?Pre-activity vitals: ?HR: 54 ?SpO2: 99 ?Post-activity vitals: ?HR: 57 ?SpO2: 99 ?Modified Borg scale for dyspnea: 1: very mild shortness of breath ? ?CURRENT PROSTHETIC WEAR ASSESSMENT: ?Patient is dependent with: skin check, residual limb care, prosthetic cleaning, ply sock cleaning, correct ply sock adjustment, proper wear schedule/adjustment, and proper weight-bearing schedule/adjustment ?Prosthetic wear tolerance: wearing liner 3-4 hours 2x/day, 7 days/week ?Edema: nonpitting ?Residual limb condition: cylindrical shape, no open areas, invaginated scar posteriorly, normal temperature & color, slight dryness to skin, sensation intact. ?Prosthetic description: [facility] plans to deliver on Thursday, 4/27.  Prosthesis is silicon liner with velcro lanyard suspension, Single axis friction engaging knee with extension assist, flexible keel foot ?K code/activity level with prosthetic NOM:[facility] 2 basic community with fixed cadence ? ? ?ASSESSMENT: ? ?CLINICAL IMPRESSION: ?Patient is a 76 y.o. male who was seen today for physical therapy evaluation and treatment for left  AKA.  He is scheduled to receive his first prosthesis in 3 days and is dependent in prosthetic care including how to safely progress his wear.  His balance & gait are impaired with limitations noted using RW & single limb stance.  He appears that he can function at community level with a prosthesis with skilled PT intervention.   ? ?OBJECTIVE IMPAIRMENTS Abnormal gait, cardiopulmonary status limiting activity, decreased activity tolerance, decreased balance, decreased endurance, decreased knowledge of use of DME, decreased mobility, decreased ROM, decreased strength, impaired flexibility, postural dysfunction, and prosthetic dependency .  ? ?ACTIVITY LIMITATIONS community activity, driving, and church.  ? ?PERSONAL FACTORS Age, Fitness, Time since onset of  injury/illness/exacerbation, and 3+ comorbidities: see PMH  are also affecting patient's functional outcome.  ? ?REHAB POTENTIAL: Good ? ?CLINICAL DECISION MAKING: Evolving/moderate complexity ? ?EVALUATION COMPLEXITY:

## 2021-07-26 ENCOUNTER — Other Ambulatory Visit: Payer: Self-pay | Admitting: Gastroenterology

## 2021-07-26 DIAGNOSIS — D696 Thrombocytopenia, unspecified: Secondary | ICD-10-CM

## 2021-07-26 DIAGNOSIS — K7030 Alcoholic cirrhosis of liver without ascites: Secondary | ICD-10-CM

## 2021-07-31 ENCOUNTER — Ambulatory Visit: Payer: Medicare Other | Admitting: Physical Therapy

## 2021-07-31 ENCOUNTER — Encounter: Payer: Self-pay | Admitting: Physical Therapy

## 2021-07-31 ENCOUNTER — Other Ambulatory Visit: Payer: Self-pay

## 2021-07-31 DIAGNOSIS — M25652 Stiffness of left hip, not elsewhere classified: Secondary | ICD-10-CM

## 2021-07-31 DIAGNOSIS — R2689 Other abnormalities of gait and mobility: Secondary | ICD-10-CM

## 2021-07-31 DIAGNOSIS — M6281 Muscle weakness (generalized): Secondary | ICD-10-CM

## 2021-07-31 DIAGNOSIS — R2681 Unsteadiness on feet: Secondary | ICD-10-CM | POA: Diagnosis not present

## 2021-07-31 DIAGNOSIS — R293 Abnormal posture: Secondary | ICD-10-CM

## 2021-07-31 NOTE — Patient Instructions (Signed)
If wearing long pants, put pants on prosthesis first. Feed prosthesis thru left pant leg. You may have to take the shoe off.  ? ?1. Turn liner inside out. Make sure label is on top so strap is in front of leg. Fold liner along seams and place circle against bottom of your leg with fold level. Roll liner up your leg. You may need to scoot forward and turn to your right to clear your leg from w/c bottom.  ?2.  Feed strap thru slit in your prosthesis. Make sure strap is not twisted. Tighten strap when sitting so greater than 2 inches of rough and soft velcro overlap.  ?3. Stand up to a walker or counter. Wiggle your leg down into the prosthesis. Pull strap out of hole first then disconnect velcro & tighten as much as possible. ?4. Pick left leg up 5-10 times putting weight on & off prosthesis. Readjust, pull strap out of hole first then disconnect velcro & tighten as much as possible. ?5. Step left leg out & back 5-10 times putting weight on & off prosthesis. Readjust, pull strap out of hole first then disconnect velcro & tighten as much as possible.  ?  ?6. Check strap only when standing. Good to check when finish toileting when standing before you pull your pants back up.   ? ? ?Standing Up ?Scoot your bottom to edge of chair. You should not be able to see chair between your legs. ?Position prosthesis so the heel is touching the floor and prosthesis toes are barely off the ground. ?Bring right foot close to chair but not under the chair. ?Place walker so it is ~6 inches in front of your chair, NOT against the chair. ?Place left hand on walker & right hand on chair to push. ?When standing up, you need to keep your prosthesis heel touching the ground which will straighten the prosthesis knee.  ?Once the prosthesis knee straightens & ONLY once the knee is straight, reach right hand to walker and straighten your back. ?Pull prosthesis back under your hips / pelvis.  ?Sitting Down: ?Back right leg back so back of leg is  touching the chair. ?Prosthesis toes should be ~2 inches forward to left foot toes. Your prosthesis is out a little.  ?Take weight off prosthesis and bend the prosthesis knee a little bit.  ?Bow your upper body towards front of walker  ?Reach right hand for chair. ?Sit down  ?

## 2021-07-31 NOTE — Therapy (Signed)
?OUTPATIENT PHYSICAL THERAPY PROSTHETIC TREATMENT NOTE ? ? ?Patient Name: Samuel Carney ?MRN: [id number] ?DOB:[birth year], 76 y.o., male ?Today's Date: 07/31/2021 ? ?PCP: Juluis Pitch, MD ?REFERRING PROVIDER: Jamse Arn, MD ? ? PT End of Session - 07/31/21 0803   ? ? Visit Number 2   ? Number of Visits 25   ? Date for PT Re-Evaluation 10/19/21   ? Authorization Type UHC Medicare   $20 co-pay   ? Progress Note Due on Visit 10   ? PT Start Time 0800   ? PT Stop Time 0845   ? PT Time Calculation (min) 45 min   ? Equipment Utilized During Treatment Gait belt   ? Activity Tolerance Patient tolerated treatment well   ? Behavior During Therapy [facility] for tasks assessed/performed   ? ?  ?  ? ?  ? ? ?Past Medical History:  ?Diagnosis Date  ? Alcohol abuse   ? Aortic atherosclerosis ([place])   ? B12 deficiency   ? Cirrhosis ([place])   ? COPD (chronic obstructive pulmonary disease) ([place])   ? Coronary artery disease   ? GERD (gastroesophageal reflux disease)   ? Glaucoma   ? Grade II diastolic dysfunction   ? History of kidney stones   ? HLD (hyperlipidemia)   ? Hx of radiation therapy   ? Hypertension   ? Lung mass   ? Moderate mitral regurgitation   ? Pneumonia   ? PVD (peripheral vascular disease) ([place])   ? Squamous cell carcinoma of lung, right ([place]) 2019  ? ?Past Surgical History:  ?Procedure Laterality Date  ? COLONOSCOPY WITH PROPOFOL    ? COLONOSCOPY WITH PROPOFOL N/A 11/25/2018  ? Procedure: COLONOSCOPY WITH PROPOFOL;  Surgeon: Lollie Sails, MD;  Location: [facility] ENDOSCOPY;  Service: Endoscopy;  Laterality: N/A;  ? ENDARTERECTOMY FEMORAL Left 07/13/2020  ? Procedure: ENDARTERECTOMY FEMORAL ( SFA STENT);  Surgeon: Katha Cabal, MD;  Location: [facility] ORS;  Service: Vascular;  Laterality: Left;  ? ESOPHAGOGASTRODUODENOSCOPY (EGD) WITH PROPOFOL N/A 11/25/2018  ? Procedure: ESOPHAGOGASTRODUODENOSCOPY (EGD) WITH PROPOFOL;  Surgeon: Lollie Sails, MD;  Location: [facility] ENDOSCOPY;  Service: Endoscopy;  Laterality:  N/A;  ? LEG AMPUTATION ABOVE KNEE Left   ? LOWER EXTREMITY ANGIOGRAPHY Left 06/07/2020  ? Procedure: LOWER EXTREMITY ANGIOGRAPHY;  Surgeon: Katha Cabal, MD;  Location: [place] CV LAB;  Service: Cardiovascular;  Laterality: Left;  ? LOWER EXTREMITY ANGIOGRAPHY Left 01/10/2021  ? Procedure: LOWER EXTREMITY ANGIOGRAPHY;  Surgeon: Katha Cabal, MD;  Location: [place] CV LAB;  Service: Cardiovascular;  Laterality: Left;  ? ?Patient Active Problem List  ? Diagnosis Date Noted  ? Atherosclerosis of artery of extremity with ulceration ([place]) 07/13/2020  ? Atherosclerosis of native arteries of the extremities with ulceration ([place]) 05/19/2020  ? Alcoholic cirrhosis of liver without ascites ([place]) 04/09/2019  ? Hyperlipidemia, mixed 04/09/2019  ? Malignant 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 ([place]) 04/09/2019  ? Thrombocytopenia ([place]) 04/09/2019  ? CAD (coronary artery disease) 10/14/2017  ? Lung cancer ([place]) 10/14/2017  ? Nodule of upper lobe of right lung 09/15/2017  ? Tobacco abuse 08/14/2017  ? Benign essential hypertension 09/09/2013  ? ? ?REFERRING DIAG:  [id number] S/P AKA unilateral, left   I70.229 critical lower limg ischemia ? ?THERAPY DIAG:  ?Unsteadiness on feet ? ?Other abnormalities of gait and mobility ? ?Stiffness of left hip, not elsewhere classified ? ?Muscle weakness (generalized) ? ?Abnormal posture ? ?PERTINENT HISTORY: PAD, CAD, angina, malignant neoplasm of right lung, HLD,  alcoholic cirrhosis of liver, HTN ? ?PRECAUTIONS: Fall ? ?SUBJECTIVE: He got prosthesis on Thursday 4 days ago.  He has worn it 2x since delivery for 10 min.  He walked a little but was unsteady.  ? ?PAIN:  ?Are you having pain? Yes: NPRS scale: 2-3/10 ?Pain location: residual limb ?Pain description: ache, sore ?Aggravating factors: liner ?Relieving factors: removing liner. ? ?OBJECTIVE:  ?  ?MUSCLE LENGTH: ?Hamstrings: 07/24/2021:  Right -19 deg; with hip 90* ?Thomas test: 07/24/2021:  Right -19 deg; Left -27 deg ?   ?POSTURE: 07/24/2021:  rounded shoulders, forward head, flexed trunk , and weight shift right ?  ?LE ROM: ?  ?Active ROM Right ?07/24/21 Left ?07/24/21  ?Hip flexion      ?Hip extension Standing -20* Standing  ?-30*  ?Hip abduction      ?Hip adduction      ?Hip internal rotation      ?Hip external rotation      ?Knee flexion      ?Knee extension      ?Ankle dorsiflexion      ?Ankle plantarflexion      ?Ankle inversion      ?Ankle eversion      ? (Blank rows = not tested) ?  ?MMT: ?  ?MMT Right ?07/24/21 Left ?07/24/21  ?Hip flexion 5/5 4/4  ?Hip extension 3/5 3-/5  ?Hip abduction 3/5 3-/5  ?Hip adduction      ?Hip internal rotation      ?Hip external rotation      ?Knee flexion 3/5 NA  ?Knee extension 4/5 NA  ?Ankle dorsiflexion 5/5 NA  ?Ankle plantarflexion   NA  ?Ankle inversion   NA  ?Ankle eversion   NA  ?(Blank rows = not tested) ?  ?TRANSFERS: ?07/24/2021:  Sit to stand: Modified independence and requires armrests & use of BUEs ?07/24/2021:  Stand to sit: Modified independence and requires armrests & use of BUEs ?  ?GAIT: ?07/24/2021:  Gait pattern: hopping with good clearance RLE ?Distance walked: 46' ?Assistive device utilized: Environmental consultant - 2 wheeled ?Level of assistance: Modified independence ?Gait velocity:  ?Comments:  ?  ?FUNCTIONAL TESTs:  ?Merrilee Jansky Balance Scale:  07/31/2021  11/56 ? [facility] PT Assessment - 07/31/21 0815   ? ?  ? Standardized Balance Assessment  ? Standardized Balance Assessment Berg Balance Test   ?  ? Berg Balance Test  ? Sit to Stand Needs minimal aid to stand or to stabilize   ? Standing Unsupported Needs several tries to stand 30 seconds unsupported   ? Sitting with Back Unsupported but Feet Supported on Floor or Stool Able to sit safely and securely 2 minutes   ? Stand to Sit Uses backs of legs against chair to control descent   ? Transfers Able to transfer safely, definite need of hands   ? Standing Unsupported with Eyes Closed Needs help to keep from falling   ? Standing Unsupported with Feet  Together Needs help to attain position and unable to hold for 15 seconds   ? From Standing, Reach Forward with Outstretched Arm Loses balance while trying/requires external support   ? From Standing Position, Pick up Object from Floor Unable to try/needs assist to keep balance   ? From Standing Position, Turn to Look Behind Over each Shoulder Needs assist to keep from losing balance and falling   ? Turn 360 Degrees Needs assistance while turning   ? Standing Unsupported, Alternately Place Feet on Step/Stool Needs assistance to keep from falling or  unable to try   ? Standing Unsupported, One Foot in [employer] balance while stepping or standing   ? Standing on One Leg Unable to try or needs assist to prevent fall   ? Total Score 11   ? Berg comment: BERG  < 36 high risk for falls (close to 100%) 46-51 moderate (>50%)   37-45 significant (>80%) 52-55 lower (> 25%)   ? ?  ?  ? ?  ? ? ?  ?CARDIOVASCULAR RESPONSE: ?07/24/2021:  Functional activity: gait hopping with RW ?Pre-activity vitals: ?HR: 54 ?SpO2: 99 ?Post-activity vitals: ?HR: 57 ?SpO2: 99 ?Modified Borg scale for dyspnea: 1: very mild shortness of breath ?  ?CURRENT PROSTHETIC WEAR ASSESSMENT: ?07/24/2021:  Patient is dependent with: skin check, residual limb care, prosthetic cleaning, ply sock cleaning, correct ply sock adjustment, proper wear schedule/adjustment, and proper weight-bearing schedule/adjustment ?Prosthetic wear tolerance: wearing liner 3-4 hours 2x/day, 7 days/week ?Edema: nonpitting ?Residual limb condition: cylindrical shape, no open areas, invaginated scar posteriorly, normal temperature & color, slight dryness to skin, sensation intact. ?Prosthetic description: [facility] plans to deliver on Thursday, 4/27.  Prosthesis is silicon liner with velcro lanyard suspension, Single axis friction engaging knee with extension assist, flexible keel foot ?K code/activity level with prosthetic TRR:NHAFB 2 basic community with fixed cadence ?  ?Today's  Treatment: ?07/31/2021 ?PROSTHETIC TRAINING: ?Wear time PT instructed in rationale for 2x/day wear for increasing total time with less pain.  PT recommended wear 2 hrs 2x/day.   ?Limb condition reports no iss

## 2021-08-02 ENCOUNTER — Encounter: Payer: Self-pay | Admitting: Physical Therapy

## 2021-08-02 ENCOUNTER — Other Ambulatory Visit: Payer: Self-pay

## 2021-08-02 ENCOUNTER — Ambulatory Visit (INDEPENDENT_AMBULATORY_CARE_PROVIDER_SITE_OTHER): Payer: Medicare Other | Admitting: Physical Therapy

## 2021-08-02 DIAGNOSIS — R2681 Unsteadiness on feet: Secondary | ICD-10-CM

## 2021-08-02 DIAGNOSIS — M25652 Stiffness of left hip, not elsewhere classified: Secondary | ICD-10-CM | POA: Diagnosis not present

## 2021-08-02 DIAGNOSIS — M6281 Muscle weakness (generalized): Secondary | ICD-10-CM | POA: Diagnosis not present

## 2021-08-02 DIAGNOSIS — R2689 Other abnormalities of gait and mobility: Secondary | ICD-10-CM

## 2021-08-02 DIAGNOSIS — R293 Abnormal posture: Secondary | ICD-10-CM

## 2021-08-02 NOTE — Therapy (Signed)
?OUTPATIENT PHYSICAL THERAPY PROSTHETIC TREATMENT NOTE ? ? ?Patient Name: Samuel Carney ?MRN: [id number] ?DOB:[birth year], 76 y.o., male ?Today's Date: 08/02/2021 ? ?PCP: Juluis Pitch, MD ?REFERRING PROVIDER: Jamse Arn, MD ? ? PT End of Session - 08/02/21 0806   ? ? Visit Number 3   ? Number of Visits 25   ? Date for PT Re-Evaluation 10/19/21   ? Authorization Type UHC Medicare   $20 co-pay   ? Progress Note Due on Visit 10   ? PT Start Time [phone]   ? PT Stop Time 0845   ? PT Time Calculation (min) 41 min   ? Equipment Utilized During Treatment Gait belt   ? Activity Tolerance Patient tolerated treatment well   ? Behavior During Therapy [facility] for tasks assessed/performed   ? ?  ?  ? ?  ? ? ? ?Past Medical History:  ?Diagnosis Date  ? Alcohol abuse   ? Aortic atherosclerosis ([place])   ? B12 deficiency   ? Cirrhosis ([place])   ? COPD (chronic obstructive pulmonary disease) ([place])   ? Coronary artery disease   ? GERD (gastroesophageal reflux disease)   ? Glaucoma   ? Grade II diastolic dysfunction   ? History of kidney stones   ? HLD (hyperlipidemia)   ? Hx of radiation therapy   ? Hypertension   ? Lung mass   ? Moderate mitral regurgitation   ? Pneumonia   ? PVD (peripheral vascular disease) ([place])   ? Squamous cell carcinoma of lung, right ([place]) 2019  ? ?Past Surgical History:  ?Procedure Laterality Date  ? COLONOSCOPY WITH PROPOFOL    ? COLONOSCOPY WITH PROPOFOL N/A 11/25/2018  ? Procedure: COLONOSCOPY WITH PROPOFOL;  Surgeon: Lollie Sails, MD;  Location: [facility] ENDOSCOPY;  Service: Endoscopy;  Laterality: N/A;  ? ENDARTERECTOMY FEMORAL Left 07/13/2020  ? Procedure: ENDARTERECTOMY FEMORAL ( SFA STENT);  Surgeon: Katha Cabal, MD;  Location: [facility] ORS;  Service: Vascular;  Laterality: Left;  ? ESOPHAGOGASTRODUODENOSCOPY (EGD) WITH PROPOFOL N/A 11/25/2018  ? Procedure: ESOPHAGOGASTRODUODENOSCOPY (EGD) WITH PROPOFOL;  Surgeon: Lollie Sails, MD;  Location: [facility] ENDOSCOPY;  Service: Endoscopy;  Laterality:  N/A;  ? LEG AMPUTATION ABOVE KNEE Left   ? LOWER EXTREMITY ANGIOGRAPHY Left 06/07/2020  ? Procedure: LOWER EXTREMITY ANGIOGRAPHY;  Surgeon: Katha Cabal, MD;  Location: [place] CV LAB;  Service: Cardiovascular;  Laterality: Left;  ? LOWER EXTREMITY ANGIOGRAPHY Left 01/10/2021  ? Procedure: LOWER EXTREMITY ANGIOGRAPHY;  Surgeon: Katha Cabal, MD;  Location: [place] CV LAB;  Service: Cardiovascular;  Laterality: Left;  ? ?Patient Active Problem List  ? Diagnosis Date Noted  ? Atherosclerosis of artery of extremity with ulceration ([place]) 07/13/2020  ? Atherosclerosis of native arteries of the extremities with ulceration ([place]) 05/19/2020  ? Alcoholic cirrhosis of liver without ascites ([place]) 04/09/2019  ? Hyperlipidemia, mixed 04/09/2019  ? Malignant 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 ([place]) 04/09/2019  ? Thrombocytopenia ([place]) 04/09/2019  ? CAD (coronary artery disease) 10/14/2017  ? Lung cancer ([place]) 10/14/2017  ? Nodule of upper lobe of right lung 09/15/2017  ? Tobacco abuse 08/14/2017  ? Benign essential hypertension 09/09/2013  ? ? ?REFERRING DIAG:  [id number] S/P AKA unilateral, left   I70.229 critical lower limg ischemia ? ?THERAPY DIAG:  ?Unsteadiness on feet ? ?Other abnormalities of gait and mobility ? ?Stiffness of left hip, not elsewhere classified ? ?Muscle weakness (generalized) ? ?Abnormal posture ? ?PERTINENT HISTORY: PAD, CAD, angina, malignant neoplasm of right lung,  HLD, alcoholic cirrhosis of liver, HTN ? ?PRECAUTIONS: Fall ? ?SUBJECTIVE: He wore prosthesis for 2hrs but only once per day due to appts..  ? ?PAIN:  ?Are you having pain?  Yes: NPRS scale: 0/10 ?Pain location: residual limb ?Pain description: ache, sore ?Aggravating factors: liner ?Relieving factors: removing liner. ? ?OBJECTIVE:  ?  ?MUSCLE LENGTH: ?Hamstrings: 07/24/2021:  Right -19 deg; with hip 90* ?Thomas test: 07/24/2021:  Right -19 deg; Left -27 deg ?  ?POSTURE: 07/24/2021:  rounded shoulders, forward head, flexed  trunk , and weight shift right ?  ?LE ROM: ?  ?Active ROM Right ?07/24/21 Left ?07/24/21  ?Hip flexion      ?Hip extension Standing -20* Standing  ?-30*  ?Hip abduction      ?Hip adduction      ?Hip internal rotation      ?Hip external rotation      ?Knee flexion      ?Knee extension      ?Ankle dorsiflexion      ?Ankle plantarflexion      ?Ankle inversion      ?Ankle eversion      ? (Blank rows = not tested) ?  ?MMT: ?  ?MMT Right ?07/24/21 Left ?07/24/21  ?Hip flexion 5/5 4/4  ?Hip extension 3/5 3-/5  ?Hip abduction 3/5 3-/5  ?Hip adduction      ?Hip internal rotation      ?Hip external rotation      ?Knee flexion 3/5 NA  ?Knee extension 4/5 NA  ?Ankle dorsiflexion 5/5 NA  ?Ankle plantarflexion   NA  ?Ankle inversion   NA  ?Ankle eversion   NA  ?(Blank rows = not tested) ?  ?TRANSFERS: ?07/24/2021:  Sit to stand: Modified independence and requires armrests & use of BUEs ?07/24/2021:  Stand to sit: Modified independence and requires armrests & use of BUEs ?  ?GAIT: ?07/24/2021:  Gait pattern: hopping with good clearance RLE ?Distance walked: 63' ?Assistive device utilized: Environmental consultant - 2 wheeled ?Level of assistance: Modified independence ?Gait velocity:  ?Comments:  ?  ?FUNCTIONAL TESTs:  ?Merrilee Jansky Balance Scale:  07/31/2021  11/56 ? ? ? ?  ?CARDIOVASCULAR RESPONSE: ?07/24/2021:  Functional activity: gait hopping with RW ?Pre-activity vitals: ?HR: 54 ?SpO2: 99 ?Post-activity vitals: ?HR: 57 ?SpO2: 99 ?Modified Borg scale for dyspnea: 1: very mild shortness of breath ?  ?CURRENT PROSTHETIC WEAR ASSESSMENT: ?07/24/2021:  Patient is dependent with: skin check, residual limb care, prosthetic cleaning, ply sock cleaning, correct ply sock adjustment, proper wear schedule/adjustment, and proper weight-bearing schedule/adjustment ?Prosthetic wear tolerance: wearing liner 3-4 hours 2x/day, 7 days/week ?Edema: nonpitting ?Residual limb condition: cylindrical shape, no open areas, invaginated scar posteriorly, normal temperature & color,  slight dryness to skin, sensation intact. ?Prosthetic description: [facility] plans to deliver on Thursday, 4/27.  Prosthesis is silicon liner with velcro lanyard suspension, Single axis friction engaging knee with extension assist, flexible keel foot ?K code/activity level with prosthetic BTD:VVOHY 2 basic community with fixed cadence ?  ?Today's Treatment: ?08/02/2021 ?Prosthetic Training with Transfemoral Prosthesis. ?Pt donned prosthesis with verbal cues & minA to tighten strap.  ?He required cues for sit to/from stand technique using RW & TFA prosthesis. PT progressed to standing from chairs without armrests with demo & verbal cues technique.  Pt sit to/from stand from chairs without armrests using with RW with min guard & verbal cues. ?Stand-pivot transfer with RW between w/c & chair without armrests with minA & verbal cues. ?Pt ambulated 8' & 20' including turning 90*  to rt & to lt with minA and verbal cues. ?Stand-pivot transfer with RW w/c to car seat and sit/stand from high van seat with min guard. ? ?07/31/2021 ?PROSTHETIC TRAINING: ?Wear time PT instructed in rationale for 2x/day wear for increasing total time with less pain.  PT recommended wear 2 hrs 2x/day.   ?Limb condition reports no issues. ?Weight bearing 5 min standing with partial weight with no increase in limb pain.  ? ?PT instructed in static stance with prosthetic toes back 1-2" from Rt toes which facilitates prosthetic knee stability with minimal muscle activity. Pt verbalized understanding. ?PT demo, verbal & handout cues on sit to stand incorporating prosthetic knee extension and stand to sit with knee control.  Pt return demo with additional PT cues. ? ?Pt ambulated 42' with RW & TFA prosthesis with minA. Pt required assist for prosthetic knee stability.  PT cued on step length, weight shift over prosthesis in stance and sequence.  ? ?PATIENT EDUCATION: ?Education details: Skin check, Prosthetic cleaning, Proper donning including  tightening suspension strap, Proper wear schedule/adjustment, positioning in sitting and sit/stand  ?Person educated: Patient and Spouse ?Education method: Explanation, Demonstration, Tactile cues, Verbal cues, a

## 2021-08-03 ENCOUNTER — Ambulatory Visit
Admission: RE | Admit: 2021-08-03 | Discharge: 2021-08-03 | Disposition: A | Discharge status: Home / Self Care | Payer: Medicare Other | Source: Ambulatory Visit | Attending: Gastroenterology | Admitting: Gastroenterology

## 2021-08-03 DIAGNOSIS — D696 Thrombocytopenia, unspecified: Secondary | ICD-10-CM | POA: Diagnosis present

## 2021-08-03 DIAGNOSIS — K7030 Alcoholic cirrhosis of liver without ascites: Secondary | ICD-10-CM | POA: Diagnosis not present

## 2021-08-07 ENCOUNTER — Encounter: Payer: Medicare Other | Admitting: Physical Therapy

## 2021-08-08 ENCOUNTER — Ambulatory Visit: Payer: Medicare Other | Admitting: Physical Therapy

## 2021-08-08 ENCOUNTER — Encounter: Payer: Self-pay | Admitting: Physical Therapy

## 2021-08-08 DIAGNOSIS — M6281 Muscle weakness (generalized): Secondary | ICD-10-CM

## 2021-08-08 DIAGNOSIS — R293 Abnormal posture: Secondary | ICD-10-CM | POA: Diagnosis not present

## 2021-08-08 DIAGNOSIS — R2681 Unsteadiness on feet: Secondary | ICD-10-CM | POA: Diagnosis not present

## 2021-08-08 DIAGNOSIS — M25652 Stiffness of left hip, not elsewhere classified: Secondary | ICD-10-CM | POA: Diagnosis not present

## 2021-08-08 DIAGNOSIS — R2689 Other abnormalities of gait and mobility: Secondary | ICD-10-CM | POA: Diagnosis not present

## 2021-08-08 NOTE — Patient Instructions (Signed)

## 2021-08-08 NOTE — Therapy (Signed)
OUTPATIENT PHYSICAL THERAPY PROSTHETIC TREATMENT NOTE   Patient Name: Samuel Carney MRN: [id number] DOB:[birth year], 76 y.o., male Today's Date: 08/08/2021  PCP: Dorothey Baseman, MD REFERRING PROVIDER: Marcello Fennel, MD   PT End of Session - 08/08/21 1015     Visit Number 4    Number of Visits 25    Date for PT Re-Evaluation 10/19/21    Authorization Type UHC Medicare   $20 co-pay    Progress Note Due on Visit 10    PT Start Time 1015    PT Stop Time 1104    PT Time Calculation (min) 49 min    Equipment Utilized During Treatment Gait belt    Activity Tolerance Patient tolerated treatment well    Behavior During Therapy WFL for tasks assessed/performed               Past Medical History:  Diagnosis Date   Alcohol abuse    Aortic atherosclerosis (HCC)    B12 deficiency    Cirrhosis (HCC)    COPD (chronic obstructive pulmonary disease) (HCC)    Coronary artery disease    GERD (gastroesophageal reflux disease)    Glaucoma    Grade II diastolic dysfunction    History of kidney stones    HLD (hyperlipidemia)    Hx of radiation therapy    Hypertension    Lung mass    Moderate mitral regurgitation    Pneumonia    PVD (peripheral vascular disease) (HCC)    Squamous cell carcinoma of lung, right (HCC) 2019   Past Surgical History:  Procedure Laterality Date   COLONOSCOPY WITH PROPOFOL     COLONOSCOPY WITH PROPOFOL N/A 11/25/2018   Procedure: COLONOSCOPY WITH PROPOFOL;  Surgeon: Christena Deem, MD;  Location: [facility] ENDOSCOPY;  Service: Endoscopy;  Laterality: N/A;   ENDARTERECTOMY FEMORAL Left 07/13/2020   Procedure: ENDARTERECTOMY FEMORAL ( SFA STENT);  Surgeon: Renford Dills, MD;  Location: [facility] ORS;  Service: Vascular;  Laterality: Left;   ESOPHAGOGASTRODUODENOSCOPY (EGD) WITH PROPOFOL N/A 11/25/2018   Procedure: ESOPHAGOGASTRODUODENOSCOPY (EGD) WITH PROPOFOL;  Surgeon: Christena Deem, MD;  Location: [facility] ENDOSCOPY;  Service: Endoscopy;   Laterality: N/A;   LEG AMPUTATION ABOVE KNEE Left    LOWER EXTREMITY ANGIOGRAPHY Left 06/07/2020   Procedure: LOWER EXTREMITY ANGIOGRAPHY;  Surgeon: Renford Dills, MD;  Location: [facility] INVASIVE CV LAB;  Service: Cardiovascular;  Laterality: Left;   LOWER EXTREMITY ANGIOGRAPHY Left 01/10/2021   Procedure: LOWER EXTREMITY ANGIOGRAPHY;  Surgeon: Renford Dills, MD;  Location: [facility] INVASIVE CV LAB;  Service: Cardiovascular;  Laterality: Left;   Patient Active Problem List   Diagnosis Date Noted   Atherosclerosis of artery of extremity with ulceration (HCC) 07/13/2020   Atherosclerosis of native arteries of the extremities with ulceration (HCC) 05/19/2020   Alcoholic cirrhosis of 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) 04/09/2019   Hyperlipidemia, mixed 04/09/2019   Malignant 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) 04/09/2019   Thrombocytopenia (HCC) 04/09/2019   CAD (coronary artery disease) 10/14/2017   Lung cancer (HCC) 10/14/2017   Nodule of upper lobe of right lung 09/15/2017   Tobacco abuse 08/14/2017   Benign essential hypertension 09/09/2013    REFERRING DIAG:  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    PAIN:  Are you having pain?  Yes: NPRS scale: 0/10 Pain location: residual limb Pain description: ache, sore Aggravating factors: liner Relieving factors: removing liner.  OBJECTIVE:    MUSCLE LENGTH: Hamstrings: 07/24/2021:  Right -19 deg; with hip 90* Thomas test: 07/24/2021:  Right -19 deg; Left -27 deg   POSTURE: 07/24/2021:  rounded shoulders, forward head, flexed trunk  , and weight shift right   LE ROM:   Active ROM Right 07/24/21 Left 07/24/21  Hip flexion      Hip extension Standing -20* Standing  -30*  Hip abduction      Hip adduction      Hip internal rotation      Hip external rotation      Knee flexion      Knee extension      Ankle dorsiflexion      Ankle plantarflexion      Ankle inversion      Ankle eversion       (Blank rows = not tested)   MMT:   MMT Right 07/24/21 Left 07/24/21  Hip flexion 5/5 4/4  Hip extension 3/5 3-/5  Hip abduction 3/5 3-/5  Hip adduction      Hip internal rotation      Hip external rotation      Knee flexion 3/5 NA  Knee extension 4/5 NA  Ankle dorsiflexion 5/5 NA  Ankle plantarflexion   NA  Ankle inversion   NA  Ankle eversion   NA  (Blank rows = not tested)   TRANSFERS: 07/24/2021:  Sit to stand: Modified independence and requires armrests & use of BUEs 07/24/2021:  Stand to sit: Modified independence and requires armrests & use of BUEs   GAIT: 07/24/2021:  Gait pattern: hopping with good clearance RLE Distance walked: 30' Assistive device utilized: Environmental consultant - 2 wheeled Level of assistance: Modified independence Gait velocity:  Comments:    FUNCTIONAL TESTs:  Berg Balance Scale:  07/31/2021  11/56      CARDIOVASCULAR RESPONSE: 07/24/2021:  Functional activity: gait hopping with RW Pre-activity vitals: HR: 54 SpO2: 99 Post-activity vitals: HR: 57 SpO2: 99 Modified Borg scale for dyspnea: 1: very mild shortness of breath   CURRENT PROSTHETIC WEAR ASSESSMENT: 07/24/2021:  Patient is dependent with: skin check, residual limb care, prosthetic cleaning, ply sock cleaning, correct ply sock adjustment, proper wear schedule/adjustment, and proper weight-bearing schedule/adjustment Prosthetic wear tolerance: wearing liner 3-4 hours 2x/day, 7 days/week Edema: nonpitting Residual limb condition: cylindrical shape, no open areas, invaginated scar posteriorly, normal temperature & color, slight  dryness to skin, sensation intact. Prosthetic description: [facility] plans to deliver on Thursday, 4/27.  Prosthesis is silicon liner with velcro lanyard suspension, Single axis friction engaging knee with extension assist, flexible keel foot K code/activity level with prosthetic ZOX:WRUEA 2 basic community with fixed cadence   Today's Treatment: 08/08/2021 Increase wear to 3hrs 2x/day.  PT verbal cues on adjusting ply socks. PT requested to bring socks next session to progress understanding. Pt & dtr verbalized understanding. Sit to/from stand w/c to sink 5 times with minimal verbal cues. Pt reported standing up at home without RW in front.  PT instructed in fall risk with this & recommended having RW or counter in front for all standing. Pt verbalized understanding.    08/08/21 1106  PT Education  Education Details HEP at sink  Person(s)  Educated Patient;Child(ren)  Methods Explanation;Demonstration;Tactile cues;Verbal cues;Handout  Comprehension Returned demonstration;Tactile cues required;Verbalized understanding;Verbal cues required;Need further instruction   See pt instructions for details.  Pt required single UE support or BUE support for now.  PT will progress to no UE support as his balance improves.  PT recommended that family member stand on his left side while performing for now. PT instructed in proprioception LLE with pressure on socket.    08/02/2021 Prosthetic Training with Transfemoral Prosthesis. Pt donned prosthesis with verbal cues & minA to tighten strap.  He required cues for sit to/from stand technique using RW & TFA prosthesis. PT progressed to standing from chairs without armrests with demo & verbal cues technique.  Pt sit to/from stand from chairs without armrests using with RW with min guard & verbal cues. Stand-pivot transfer with RW between w/c & chair without armrests with minA & verbal cues. Pt ambulated 8' & 20' including turning 90* to rt & to lt with minA  and verbal cues. Stand-pivot transfer with RW w/c to car seat and sit/stand from high van seat with min guard.  07/31/2021 PROSTHETIC TRAINING: Wear time PT instructed in rationale for 2x/day wear for increasing total time with less pain.  PT recommended wear 2 hrs 2x/day.   Limb condition reports no issues. Weight bearing 5 min standing with partial weight with no increase in limb pain.   PT instructed in static stance with prosthetic toes back 1-2" from Rt toes which facilitates prosthetic knee stability with minimal muscle activity. Pt verbalized understanding. PT demo, verbal & handout cues on sit to stand incorporating prosthetic knee extension and stand to sit with knee control.  Pt return demo with additional PT cues.  Pt ambulated 5' with RW & TFA prosthesis with minA. Pt required assist for prosthetic knee stability.  PT cued on step length, weight shift over prosthesis in stance and sequence.   PATIENT EDUCATION: Education details: Skin check, Prosthetic cleaning, Proper donning including tightening suspension strap, Proper wear schedule/adjustment, positioning in sitting and sit/stand  Person educated: Patient and Spouse Education method: Explanation, Demonstration, Tactile cues, Verbal cues, and Handouts Education comprehension: verbalized understanding, returned demonstration, verbal cues required, tactile cues required, and needs further education   ASSESSMENT:   CLINICAL IMPRESSION: Pt & dtr appear to understand HEP to work on balance & proprioception.  He required assist & cues to weight shift onto prosthesis.     OBJECTIVE IMPAIRMENTS Abnormal gait, cardiopulmonary status limiting activity, decreased activity tolerance, decreased balance, decreased endurance, decreased knowledge of use of DME, decreased mobility, decreased ROM, decreased strength, impaired flexibility, postural dysfunction, and prosthetic dependency .    ACTIVITY LIMITATIONS community activity, driving, and  church.    PERSONAL FACTORS Age, Fitness, Time since onset of injury/illness/exacerbation, and 3+ comorbidities: see PMH  are also affecting patient's functional outcome.    REHAB POTENTIAL: Good   CLINICAL DECISION MAKING: Evolving/moderate complexity   EVALUATION COMPLEXITY: Moderate     GOALS: Goals reviewed with patient? Yes   SHORT TERM GOALS: Target date: 08/24/2021   Patient donnes prosthesis modified independent & verbalizes proper cleaning. Baseline: SEE OBJECTIVE DATA Goal status: INITIAL 2.  Patient tolerates prosthesis >10 hrs total /day without skin issues or limb pain <3/10 after standing. Baseline: SEE OBJECTIVE DATA Goal status: INITIAL   3.  Patient able to reach 7" and look over both shoulders without UE support with supervision. Baseline: SEE OBJECTIVE DATA Goal status: INITIAL   4. Patient ambulates 26' with RW &  prosthesis with supervision. Baseline: SEE OBJECTIVE DATA Goal status: INITIAL   5. Patient negotiates ramps & curbs with RW & prosthesis with minA. Baseline: SEE OBJECTIVE DATA Goal status: INITIAL   LONG TERM GOALS: Target date: 10/19/2021   Patient demonstrates & verbalized understanding of prosthetic care to enable safe utilization of prosthesis. Baseline: SEE OBJECTIVE DATA Goal status: INITIAL   Patient tolerates prosthesis wear >90% of awake hours without skin or limb pain issues. Baseline: SEE OBJECTIVE DATA Goal status: INITIAL   Berg Balance </= 36/56 to indicate lower fall risk Baseline: SEE OBJECTIVE DATA Goal status: INITIAL   Patient ambulates >300' with LRAD & prosthesis safely independently for community mobility. Baseline: SEE OBJECTIVE DATA Goal status: INITIAL   Patient negotiates ramps, curbs & stairs with single rail with LRAD & prosthesis independently for community mobility. Baseline: SEE OBJECTIVE DATA Goal status: INITIAL   PLAN: PT FREQUENCY: 2x/week   PT DURATION: 12 weeks   PLANNED INTERVENTIONS:  Therapeutic exercises, Therapeutic activity, Neuromuscular re-education, Balance training, Gait training, Patient/Family education, Stair training, Vestibular training, Prosthetic training, DME instructions, and scar mobilization   PLAN FOR NEXT SESSION: instruct in donning & adjusting ply socks, verbally check on HEP at sink, prosthetic gait with RW, balance activities.    Vladimir Faster, PT, DPT 08/08/2021, 11:14 AM

## 2021-08-10 ENCOUNTER — Ambulatory Visit: Payer: Medicare Other | Admitting: Physical Therapy

## 2021-08-10 ENCOUNTER — Encounter: Payer: Self-pay | Admitting: Physical Therapy

## 2021-08-10 DIAGNOSIS — R2689 Other abnormalities of gait and mobility: Secondary | ICD-10-CM

## 2021-08-10 DIAGNOSIS — M6281 Muscle weakness (generalized): Secondary | ICD-10-CM

## 2021-08-10 DIAGNOSIS — R2681 Unsteadiness on feet: Secondary | ICD-10-CM | POA: Diagnosis not present

## 2021-08-10 DIAGNOSIS — M25652 Stiffness of left hip, not elsewhere classified: Secondary | ICD-10-CM | POA: Diagnosis not present

## 2021-08-10 DIAGNOSIS — R293 Abnormal posture: Secondary | ICD-10-CM

## 2021-08-10 NOTE — Patient Instructions (Signed)
Increasing your activity level is important. ° °Short distances which is walking from one room to another. Work to increase frequency back to prior level. ° °Medium distances are entering & exiting your home or community with limited distances. Start with 4 medium walks which is one outing to one location and increase number of tolerated amounts per day. ° °Long distance is your highest tolerance for you. Walk until you feel you must rest. Back or leg pain or general fatigue are indicators to maximum tolerance. Monitor by distance or time. Try to walk your BEST distance 1-2 times per day. You should see this increase over time.  ° °

## 2021-08-10 NOTE — Therapy (Signed)
?OUTPATIENT PHYSICAL THERAPY PROSTHETIC TREATMENT NOTE ? ? ?Patient Name: Samuel Carney ?MRN: [id number] ?DOB:[birth year], 76 y.o., male ?Today's Date: 08/10/2021 ? ?PCP: Juluis Pitch, MD ?REFERRING PROVIDER: Jamse Arn, MD ? ? PT End of Session - 08/10/21 1107   ? ? Visit Number 5   ? Number of Visits 25   ? Date for PT Re-Evaluation 10/19/21   ? Authorization Type [employer]HC Medicare   $20 co-pay   ? Progress Note Due on Visit 10   ? PT Start Time 1100   ? PT Stop Time 1145   ? PT Time Calculation (min) 45 min   ? Equipment Utilized During Treatment Gait belt   ? Activity Tolerance Patient tolerated treatment well   ? Behavior During Therapy [facility] for tasks assessed/performed   ? ?  ?  ? ?  ? ? ? ? ? ?Past Medical History:  ?Diagnosis Date  ? Alcohol abuse   ? Aortic atherosclerosis ([place])   ? B12 deficiency   ? Cirrhosis ([place])   ? COPD (chronic obstructive pulmonary disease) ([place])   ? Coronary artery disease   ? GERD (gastroesophageal reflux disease)   ? Glaucoma   ? Grade II diastolic dysfunction   ? History of kidney stones   ? HLD (hyperlipidemia)   ? Hx of radiation therapy   ? Hypertension   ? Lung mass   ? Moderate mitral regurgitation   ? Pneumonia   ? PVD (peripheral vascular disease) ([place])   ? Squamous cell carcinoma of lung, right ([place]) 2019  ? ?Past Surgical History:  ?Procedure Laterality Date  ? COLONOSCOPY WITH PROPOFOL    ? COLONOSCOPY WITH PROPOFOL N/A 11/25/2018  ? Procedure: COLONOSCOPY WITH PROPOFOL;  Surgeon: Lollie Sails, MD;  Location: [facility] ENDOSCOPY;  Service: Endoscopy;  Laterality: N/A;  ? ENDARTERECTOMY FEMORAL Left 07/13/2020  ? Procedure: ENDARTERECTOMY FEMORAL ( SFA STENT);  Surgeon: Katha Cabal, MD;  Location: [facility] ORS;  Service: Vascular;  Laterality: Left;  ? ESOPHAGOGASTRODUODENOSCOPY (EGD) WITH PROPOFOL N/A 11/25/2018  ? Procedure: ESOPHAGOGASTRODUODENOSCOPY (EGD) WITH PROPOFOL;  Surgeon: Lollie Sails, MD;  Location: [facility] ENDOSCOPY;  Service: Endoscopy;   Laterality: N/A;  ? LEG AMPUTATION ABOVE KNEE Left   ? LOWER EXTREMITY ANGIOGRAPHY Left 06/07/2020  ? Procedure: LOWER EXTREMITY ANGIOGRAPHY;  Surgeon: Katha Cabal, MD;  Location: [place] CV LAB;  Service: Cardiovascular;  Laterality: Left;  ? LOWER EXTREMITY ANGIOGRAPHY Left 01/10/2021  ? Procedure: LOWER EXTREMITY ANGIOGRAPHY;  Surgeon: Katha Cabal, MD;  Location: [place] CV LAB;  Service: Cardiovascular;  Laterality: Left;  ? ?Patient Active Problem List  ? Diagnosis Date Noted  ? Atherosclerosis of artery of extremity with ulceration ([place]) 07/13/2020  ? Atherosclerosis of native arteries of the extremities with ulceration ([place]) 05/19/2020  ? Alcoholic cirrhosis of liver without ascites ([place]) 04/09/2019  ? Hyperlipidemia, mixed 04/09/2019  ? 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 Thrombocytopenia ([place]) 04/09/2019  ? CAD (coronary artery disease) 10/14/2017  ? Lung cancer ([place]) 10/14/2017  ? Nodule of upper lobe of right lung 09/15/2017  ? Tobacco abuse 08/14/2017  ? Benign essential hypertension 09/09/2013  ? ? ?REFERRING DIAG:  [id number] S/P AKA unilateral, left   I70.229 critical lower limg ischemia ? ?THERAPY DIAG:  ?Unsteadiness on feet ? ?Other abnormalities of gait and mobility ? ?Stiffness of left hip, not elsewhere classified ? ?Abnormal posture ? ?Muscle weakness (generalized) ? ?PERTINENT HISTORY: PAD, CAD, angina, malignant neoplasm of  right lung, HLD, alcoholic cirrhosis of liver, HTN ? ?PRECAUTIONS: Fall ? ?SUBJECTIVE: He is wearing prosthesis daily varying time.  He arrived in w/c carrying prosthesis still.   ? ?PAIN:  ?Are you having pain?  Yes: NPRS scale: 0/10 ?Pain location: residual limb ?Pain description: ache, sore ?Aggravating factors: liner ?Relieving factors: removing liner. ? ?OBJECTIVE:  ?  ?MUSCLE LENGTH: ?Hamstrings: 07/24/2021:  Right -19 deg; with hip 90* ?Thomas test: 07/24/2021:  Right -19 deg; Left -27 deg ?  ?POSTURE: 07/24/2021:   rounded shoulders, forward head, flexed trunk , and weight shift right ?  ?LE ROM: ?  ?Active ROM Right ?07/24/21 Left ?07/24/21  ?Hip flexion      ?Hip extension Standing -20* Standing  ?-30*  ?Hip abduction      ?Hip adduction      ?Hip internal rotation      ?Hip external rotation      ?Knee flexion      ?Knee extension      ?Ankle dorsiflexion      ?Ankle plantarflexion      ?Ankle inversion      ?Ankle eversion      ? (Blank rows = not tested) ?  ?MMT: ?  ?MMT Right ?07/24/21 Left ?07/24/21  ?Hip flexion 5/5 4/4  ?Hip extension 3/5 3-/5  ?Hip abduction 3/5 3-/5  ?Hip adduction      ?Hip internal rotation      ?Hip external rotation      ?Knee flexion 3/5 NA  ?Knee extension 4/5 NA  ?Ankle dorsiflexion 5/5 NA  ?Ankle plantarflexion   NA  ?Ankle inversion   NA  ?Ankle eversion   NA  ?(Blank rows = not tested) ?  ?TRANSFERS: ?07/24/2021:  Sit to stand: Modified independence and requires armrests & use of BUEs ?07/24/2021:  Stand to sit: Modified independence and requires armrests & use of BUEs ?  ?GAIT: ?07/24/2021:  Gait pattern: hopping with good clearance RLE ?Distance walked: 65' ?Assistive device utilized: Environmental consultant - 2 wheeled ?Level of assistance: Modified independence ?Gait velocity:  ?Comments:  ?  ?FUNCTIONAL TESTs:  ?Merrilee Jansky Balance Scale:  07/31/2021  11/56 ? ? ? ?  ?CARDIOVASCULAR RESPONSE: ?07/24/2021:  Functional activity: gait hopping with RW ?Pre-activity vitals: ?HR: 54 ?SpO2: 99 ?Post-activity vitals: ?HR: 57 ?SpO2: 99 ?Modified Borg scale for dyspnea: 1: very mild shortness of breath ?  ?CURRENT PROSTHETIC WEAR ASSESSMENT: ?07/24/2021:  Patient is dependent with: skin check, residual limb care, prosthetic cleaning, ply sock cleaning, correct ply sock adjustment, proper wear schedule/adjustment, and proper weight-bearing schedule/adjustment ?Prosthetic wear tolerance: wearing liner 3-4 hours 2x/day, 7 days/week ?Edema: nonpitting ?Residual limb condition: cylindrical shape, no open areas, invaginated scar  posteriorly, normal temperature & color, slight dryness to skin, sensation intact. ?Prosthetic description: [facility] plans to deliver on Thursday, 4/27.  Prosthesis is silicon liner with velcro lanyard suspension, Single axis friction engaging knee with extension assist, flexible keel foot ?K code/activity level with prosthetic FUX:NATFT 2 basic community with fixed cadence ?  ?Today's Treatment: ?08/10/2021 ?Prosthetic Training with Transfemoral Prosthesis. ?PT instructed with demo & verbal cues on donning socks & adjusting ply fit. Pt, wife & dtr verbalized understanding. ?PT instructed in increasing activity level (see pt instructions) and working to limit w/c use for sitting & mobility. PT demo & verbal cues on supervising / guarding & following with w/c. Pt, wife & dtr verbalized understanding.  ?Pt ambulated 20' with RW from w/c to chair without armrests including sit/stand with supervision. ?Pt  ambulated 51' with RW with supervision & verbal cues.  ?Pt neg ramp with RW with min guard & verbal cues on technique. ? ?08/08/2021 ?Increase wear to 3hrs 2x/day.  ?PT verbal cues on adjusting ply socks. PT requested to bring socks next session to progress understanding. Pt & dtr verbalized understanding. ?Sit to/from stand w/c to sink 5 times with minimal verbal cues. Pt reported standing up at home without RW in front.  PT instructed in fall risk with this & recommended having RW or counter in front for all standing. Pt verbalized understanding. ? ? ? 08/08/21 1106  ?PT Education  ?Education Details HEP at sink  ?Person(s) Educated Patient;Child(ren)  ?Methods Explanation;Demonstration;Tactile cues;Verbal cues;Handout  ?Comprehension Returned demonstration;Tactile cues required;Verbalized understanding;Verbal cues required;Need further instruction  ? ?See pt instructions for details.  Pt required single UE support or BUE support for now.  PT will progress to no UE support as his balance improves.  PT recommended  that family member stand on his left side while performing for now. PT instructed in proprioception LLE with pressure on socket.   ? ?08/02/2021 ?Prosthetic Training with Transfemoral Prosthesis. ?Pt donned prosthes

## 2021-08-14 ENCOUNTER — Ambulatory Visit (INDEPENDENT_AMBULATORY_CARE_PROVIDER_SITE_OTHER): Payer: Medicare Other | Admitting: Physical Therapy

## 2021-08-14 ENCOUNTER — Encounter: Payer: Self-pay | Admitting: Physical Therapy

## 2021-08-14 DIAGNOSIS — R293 Abnormal posture: Secondary | ICD-10-CM | POA: Diagnosis not present

## 2021-08-14 DIAGNOSIS — R2689 Other abnormalities of gait and mobility: Secondary | ICD-10-CM

## 2021-08-14 DIAGNOSIS — M6281 Muscle weakness (generalized): Secondary | ICD-10-CM

## 2021-08-14 DIAGNOSIS — R2681 Unsteadiness on feet: Secondary | ICD-10-CM | POA: Diagnosis not present

## 2021-08-14 DIAGNOSIS — M25652 Stiffness of left hip, not elsewhere classified: Secondary | ICD-10-CM | POA: Diagnosis not present

## 2021-08-14 NOTE — Therapy (Signed)
?OUTPATIENT PHYSICAL THERAPY PROSTHETIC TREATMENT NOTE ? ? ?Patient Name: Samuel Carney ?MRN: [id number] ?DOB:12-May-1945, 76 y.o., male ?Today's Date: 08/14/2021 ? ?PCP: Juluis Pitch, MD ?REFERRING PROVIDER: Jamse Arn, MD ? ? PT End of Session - 08/14/21 0851   ? ? Visit Number 6   ? Number of Visits 25   ? Date for PT Re-Evaluation 10/19/21   ? Authorization Type UHC Medicare   $20 co-pay   ? Progress Note Due on Visit 10   ? PT Start Time 0848   ? PT Stop Time 0930   ? PT Time Calculation (min) 42 min   ? Equipment Utilized During Treatment Gait belt   ? Activity Tolerance Patient tolerated treatment well   ? Behavior During Therapy [facility] for tasks assessed/performed   ? ?  ?  ? ?  ? ? ? ? ? ? ?Past Medical History:  ?Diagnosis Date  ? Alcohol abuse   ? Aortic atherosclerosis ([place])   ? B12 deficiency   ? Cirrhosis ([place])   ? COPD (chronic obstructive pulmonary disease) ([place])   ? Coronary artery disease   ? GERD (gastroesophageal reflux disease)   ? Glaucoma   ? Grade II diastolic dysfunction   ? History of kidney stones   ? HLD (hyperlipidemia)   ? Hx of radiation therapy   ? Hypertension   ? Lung mass   ? Moderate mitral regurgitation   ? Pneumonia   ? PVD (peripheral vascular disease) ([place])   ? Squamous cell carcinoma of lung, right ([place]) 2019  ? ?Past Surgical History:  ?Procedure Laterality Date  ? COLONOSCOPY WITH PROPOFOL    ? COLONOSCOPY WITH PROPOFOL N/A 11/25/2018  ? Procedure: COLONOSCOPY WITH PROPOFOL;  Surgeon: Lollie Sails, MD;  Location: [facility] ENDOSCOPY;  Service: Endoscopy;  Laterality: N/A;  ? ENDARTERECTOMY FEMORAL Left 07/13/2020  ? Procedure: ENDARTERECTOMY FEMORAL ( SFA STENT);  Surgeon: Katha Cabal, MD;  Location: [facility] ORS;  Service: Vascular;  Laterality: Left;  ? ESOPHAGOGASTRODUODENOSCOPY (EGD) WITH PROPOFOL N/A 11/25/2018  ? Procedure: ESOPHAGOGASTRODUODENOSCOPY (EGD) WITH PROPOFOL;  Surgeon: Lollie Sails, MD;  Location: [facility] ENDOSCOPY;  Service: Endoscopy;   Laterality: N/A;  ? LEG AMPUTATION ABOVE KNEE Left   ? LOWER EXTREMITY ANGIOGRAPHY Left 06/07/2020  ? Procedure: LOWER EXTREMITY ANGIOGRAPHY;  Surgeon: Katha Cabal, MD;  Location: [place] CV LAB;  Service: Cardiovascular;  Laterality: Left;  ? LOWER EXTREMITY ANGIOGRAPHY Left 01/10/2021  ? Procedure: LOWER EXTREMITY ANGIOGRAPHY;  Surgeon: Katha Cabal, MD;  Location: [place] CV LAB;  Service: Cardiovascular;  Laterality: Left;  ? ?Patient Active Problem List  ? Diagnosis Date Noted  ? Atherosclerosis of artery of extremity with ulceration ([place]) 07/13/2020  ? Atherosclerosis of native arteries of the extremities with ulceration ([place]) 05/19/2020  ? Alcoholic cirrhosis of liver without ascites ([place]) 04/09/2019  ? Hyperlipidemia, mixed 04/09/2019  ? Malignant 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 ([place]) 04/09/2019  ? Thrombocytopenia ([place]) 04/09/2019  ? CAD (coronary artery disease) 10/14/2017  ? Lung cancer ([place]) 10/14/2017  ? Nodule of upper lobe of right lung 09/15/2017  ? Tobacco abuse 08/14/2017  ? Benign essential hypertension 09/09/2013  ? ? ?REFERRING DIAG:  [id number] S/P AKA unilateral, left   I70.229 critical lower limg ischemia ? ?THERAPY DIAG:  ?Unsteadiness on feet ? ?Other abnormalities of gait and mobility ? ?Stiffness of left hip, not elsewhere classified ? ?Abnormal posture ? ?Muscle weakness (generalized) ? ?PERTINENT HISTORY: PAD, CAD, angina, malignant neoplasm  of right lung, HLD, alcoholic cirrhosis of liver, HTN ? ?PRECAUTIONS: Fall ? ?SUBJECTIVE: He wore prosthesis daily 4hrs 2x for couple of days but other days only 1x/day. He has been walking more. He arrived wearing prosthesis for first time but in his w/c.  ? ?PAIN:  ?Are you having pain?  No: NPRS scale: 0/10 ?Pain location: residual limb ?Pain description: ache, sore ?Aggravating factors: liner ?Relieving factors: removing liner. ? ?OBJECTIVE:  ?  ?MUSCLE LENGTH: ?Hamstrings: 07/24/2021:  Right -19 deg; with hip  90* ?Thomas test: 07/24/2021:  Right -19 deg; Left -27 deg ?  ?POSTURE: 07/24/2021:  rounded shoulders, forward head, flexed trunk , and weight shift right ?  ?LE ROM: ?  ?Active ROM Right ?07/24/21 Left ?07/24/21  ?Hip flexion      ?Hip extension Standing -20* Standing  ?-30*  ?Hip abduction      ?Hip adduction      ?Hip internal rotation      ?Hip external rotation      ?Knee flexion      ?Knee extension      ?Ankle dorsiflexion      ?Ankle plantarflexion      ?Ankle inversion      ?Ankle eversion      ? (Blank rows = not tested) ?  ?MMT: ?  ?MMT Right ?07/24/21 Left ?07/24/21  ?Hip flexion 5/5 4/4  ?Hip extension 3/5 3-/5  ?Hip abduction 3/5 3-/5  ?Hip adduction      ?Hip internal rotation      ?Hip external rotation      ?Knee flexion 3/5 NA  ?Knee extension 4/5 NA  ?Ankle dorsiflexion 5/5 NA  ?Ankle plantarflexion   NA  ?Ankle inversion   NA  ?Ankle eversion   NA  ?(Blank rows = not tested) ?  ?TRANSFERS: ?07/24/2021:  Sit to stand: Modified independence and requires armrests & use of BUEs ?07/24/2021:  Stand to sit: Modified independence and requires armrests & use of BUEs ?  ?GAIT: ?07/24/2021:  Gait pattern: hopping with good clearance RLE ?Distance walked: 9' ?Assistive device utilized: Environmental consultant - 2 wheeled ?Level of assistance: Modified independence ?Gait velocity:  ?Comments:  ?  ?FUNCTIONAL TESTs:  ?Merrilee Jansky Balance Scale:  07/31/2021  11/56 ? ? ? ?  ?CARDIOVASCULAR RESPONSE: ?07/24/2021:  Functional activity: gait hopping with RW ?Pre-activity vitals: ?HR: 54 ?SpO2: 99 ?Post-activity vitals: ?HR: 57 ?SpO2: 99 ?Modified Borg scale for dyspnea: 1: very mild shortness of breath ?  ?CURRENT PROSTHETIC WEAR ASSESSMENT: ?07/24/2021:  Patient is dependent with: skin check, residual limb care, prosthetic cleaning, ply sock cleaning, correct ply sock adjustment, proper wear schedule/adjustment, and proper weight-bearing schedule/adjustment ?Prosthetic wear tolerance: wearing liner 3-4 hours 2x/day, 7 days/week ?Edema:  nonpitting ?Residual limb condition: cylindrical shape, no open areas, invaginated scar posteriorly, normal temperature & color, slight dryness to skin, sensation intact. ?Prosthetic description: [facility] plans to deliver on Thursday, 4/27.  Prosthesis is silicon liner with velcro lanyard suspension, Single axis friction engaging knee with extension assist, flexible keel foot ?K code/activity level with prosthetic ZJI:RCVEL 2 basic community with fixed cadence ?  ?Today's Treatment: ?08/14/2021 ?Prosthetic Training with Transfemoral Prosthesis. ?PT recommended daily wear 4-5 hrs 2x/day initiating first wear upon arising. Pt verbalized understanding.  ?Sit to/from stand from w/c with goal to not use RW for stabilization.  ?Pt ambulated 110' with RW with supervision & verbal cues.  ?Pt neg ramp & curb with RW with min guard & verbal cues on technique. ?Turn 180* with  RW with cues to weight bear thru prosthesis in stance.  ? ?Neuromuscular Re-education: ?Standing feet hip width apart with prosthetic foot 2" back to RLE: eyes open head turns 4 directions and eyes closed static 10sec 3 reps with min guard for both. ?MinA & tactile / verbal cues on upright posture & balance reactions: UE resistance red theraband alternating UEs & BUEs 10 reps ea row, forward reach & upward reach.  ? ?08/10/2021 ?Prosthetic Training with Transfemoral Prosthesis. ?PT instructed with demo & verbal cues on donning socks & adjusting ply fit. Pt, wife & dtr verbalized understanding. ?PT instructed in increasing activity level (see pt instructions) and working to limit w/c use for sitting & mobility. PT demo & verbal cues on supervising / guarding & following with w/c. Pt, wife & dtr verbalized understanding.  ?Pt ambulated 20' with RW from w/c to chair without armrests including sit/stand with supervision. ?Pt ambulated 110' with RW with supervision & verbal cues.  ?Pt neg ramp with RW with min guard & verbal cues on  technique. ? ?08/08/2021 ?Increase wear to 3hrs 2x/day.  ?PT verbal cues on adjusting ply socks. PT requested to bring socks next session to progress understanding. Pt & dtr verbalized understanding. ?Sit to/from stand w/c to sink 5 times with minimal

## 2021-08-16 ENCOUNTER — Encounter: Payer: Self-pay | Admitting: Physical Therapy

## 2021-08-16 ENCOUNTER — Ambulatory Visit: Payer: Medicare Other | Admitting: Physical Therapy

## 2021-08-16 DIAGNOSIS — R2681 Unsteadiness on feet: Secondary | ICD-10-CM | POA: Diagnosis not present

## 2021-08-16 DIAGNOSIS — R293 Abnormal posture: Secondary | ICD-10-CM

## 2021-08-16 DIAGNOSIS — R2689 Other abnormalities of gait and mobility: Secondary | ICD-10-CM

## 2021-08-16 DIAGNOSIS — M6281 Muscle weakness (generalized): Secondary | ICD-10-CM

## 2021-08-16 DIAGNOSIS — M25652 Stiffness of left hip, not elsewhere classified: Secondary | ICD-10-CM | POA: Diagnosis not present

## 2021-08-16 NOTE — Therapy (Signed)
?OUTPATIENT PHYSICAL THERAPY PROSTHETIC TREATMENT NOTE ? ? ?Patient Name: Samuel Carney ?MRN: [id number] ?DOB:1945-10-31, 76 y.o., male ?Today's Date: 08/16/2021 ? ?PCP: Juluis Pitch, MD ?REFERRING PROVIDER: Jamse Arn, MD ? ? PT End of Session - 08/16/21 0848   ? ? Visit Number 7   ? Number of Visits 25   ? Date for PT Re-Evaluation 10/19/21   ? Authorization Type UHC Medicare   $20 co-pay   ? Progress Note Due on Visit 10   ? PT Start Time 0845   ? PT Stop Time 0930   ? PT Time Calculation (min) 45 min   ? Equipment Utilized During Treatment Gait belt   ? Activity Tolerance Patient tolerated treatment well   ? Behavior During Therapy [facility] for tasks assessed/performed   ? ?  ?  ? ?  ? ? ? ? ? ? ? ?Past Medical History:  ?Diagnosis Date  ? Alcohol abuse   ? Aortic atherosclerosis ([place])   ? B12 deficiency   ? Cirrhosis ([place])   ? COPD (chronic obstructive pulmonary disease) ([place])   ? Coronary artery disease   ? GERD (gastroesophageal reflux disease)   ? Glaucoma   ? Grade II diastolic dysfunction   ? History of kidney stones   ? HLD (hyperlipidemia)   ? Hx of radiation therapy   ? Hypertension   ? Lung mass   ? Moderate mitral regurgitation   ? Pneumonia   ? PVD (peripheral vascular disease) ([place])   ? Squamous cell carcinoma of lung, right ([place]) 2019  ? ?Past Surgical History:  ?Procedure Laterality Date  ? COLONOSCOPY WITH PROPOFOL    ? COLONOSCOPY WITH PROPOFOL N/A 11/25/2018  ? Procedure: COLONOSCOPY WITH PROPOFOL;  Surgeon: Lollie Sails, MD;  Location: [facility] ENDOSCOPY;  Service: Endoscopy;  Laterality: N/A;  ? ENDARTERECTOMY FEMORAL Left 07/13/2020  ? Procedure: ENDARTERECTOMY FEMORAL ( SFA STENT);  Surgeon: Katha Cabal, MD;  Location: [facility] ORS;  Service: Vascular;  Laterality: Left;  ? ESOPHAGOGASTRODUODENOSCOPY (EGD) WITH PROPOFOL N/A 11/25/2018  ? Procedure: ESOPHAGOGASTRODUODENOSCOPY (EGD) WITH PROPOFOL;  Surgeon: Lollie Sails, MD;  Location[facility] ENDOSCOPY;  Service: Endoscopy;   Laterality: N/A;  ? LEG AMPUTATION ABOVE KNEE Left   ? LOWER EXTREMITY ANGIOGRAPHY Left 06/07/2020  ? Procedure: LOWER EXTREMITY ANGIOGRAPHY;  Surgeon: Katha Cabal, MD;  Location: [place] CV LAB;  Service: Cardiovascular;  Laterality: Left;  ? LOWER EXTREMITY ANGIOGRAPHY Left 01/10/2021  ? Procedure: LOWER EXTREMITY ANGIOGRAPHY;  Surgeon: Katha Cabal, MD;  Location: [place] CV LAB;  Service: Cardiovascular;  Laterality: Left;  ? ?Patient Active Problem List  ? Diagnosis Date Noted  ? Atherosclerosis of artery of extremity with ulceration ([place]) 07/13/2020  ? Atherosclerosis of native arteries of the extremities with ulceration ([place]) 05/19/2020  ? Alcoholic cirrhosis of liver without ascites ([place]) 04/09/2019  ? Hyperlipidemia, mixed 04/09/2019  ? Malignant 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 ([place]) 04/09/2019  ? Thrombocytopenia ([place]) 04/09/2019  ? CAD (coronary artery disease) 10/14/2017  ? Lung cancer ([place]) 10/14/2017  ? Nodule of upper lobe of right lung 09/15/2017  ? Tobacco abuse 08/14/2017  ? Benign essential hypertension 09/09/2013  ? ? ?REFERRING DIAG:  [id number] S/P AKA unilateral, left   I70.229 critical lower limg ischemia ? ?THERAPY DIAG:  ?Unsteadiness on feet ? ?Other abnormalities of gait and mobility ? ?Stiffness of left hip, not elsewhere classified ? ?Muscle weakness (generalized) ? ?Abnormal posture ? ?PERTINENT HISTORY: PAD, CAD, angina, malignant  neoplasm of right lung, HLD, alcoholic cirrhosis of liver, HTN ? ?PRECAUTIONS: Fall ? ?SUBJECTIVE: He is wearing prosthesis 4hrs 2x/day.   ? ?PAIN:  ?Are you having pain?   No: NPRS scale: 0/10 ?Pain location: residual limb ?Pain description: ache, sore ?Aggravating factors: liner ?Relieving factors: removing liner. ? ?OBJECTIVE:  ?  ?MUSCLE LENGTH: ?Hamstrings: 07/24/2021:  Right -19 deg; with hip 90* ?Thomas test: 07/24/2021:  Right -19 deg; Left -27 deg ?  ?POSTURE: 07/24/2021:  rounded shoulders, forward head, flexed trunk , and  weight shift right ?  ?LE ROM: ?  ?Active ROM Right ?07/24/21 Left ?07/24/21  ?Hip flexion      ?Hip extension Standing -20* Standing  ?-30*  ?Hip abduction      ?Hip adduction      ?Hip internal rotation      ?Hip external rotation      ?Knee flexion      ?Knee extension      ?Ankle dorsiflexion      ?Ankle plantarflexion      ?Ankle inversion      ?Ankle eversion      ? (Blank rows = not tested) ?  ?MMT: ?  ?MMT Right ?07/24/21 Left ?07/24/21  ?Hip flexion 5/5 4/4  ?Hip extension 3/5 3-/5  ?Hip abduction 3/5 3-/5  ?Hip adduction      ?Hip internal rotation      ?Hip external rotation      ?Knee flexion 3/5 NA  ?Knee extension 4/5 NA  ?Ankle dorsiflexion 5/5 NA  ?Ankle plantarflexion   NA  ?Ankle inversion   NA  ?Ankle eversion   NA  ?(Blank rows = not tested) ?  ?TRANSFERS: ?07/24/2021:  Sit to stand: Modified independence and requires armrests & use of BUEs ?07/24/2021:  Stand to sit: Modified independence and requires armrests & use of BUEs ?  ?GAIT: ?07/24/2021:  Gait pattern: hopping with good clearance RLE ?Distance walked: 34' ?Assistive device utilized: Environmental consultant - 2 wheeled ?Level of assistance: Modified independence ?Gait velocity:  ?Comments:  ?  ?FUNCTIONAL TESTs:  ?Merrilee Jansky Balance Scale:  07/31/2021  11/56 ? ?[facility] PT Assessment - 07/31/21 0815   ?  ?    ?     ?  Standardized Balance Assessment  ?  Standardized Balance Assessment Berg Balance Test   ?     ?  Berg Balance Test  ?  Sit to Stand Needs minimal aid to stand or to stabilize   ?  Standing Unsupported Needs several tries to stand 30 seconds unsupported   ?  Sitting with Back Unsupported but Feet Supported on Floor or Stool Able to sit safely and securely 2 minutes   ?  Stand to Sit Uses backs of legs against chair to control descent   ?  Transfers Able to transfer safely, definite need of hands   ?  Standing Unsupported with Eyes Closed Needs help to keep from falling   ?  Standing Unsupported with Feet Together Needs help to attain position and unable to  hold for 15 seconds   ?  From Standing, Reach Forward with Outstretched Arm Loses balance while trying/requires external support   ?  From Standing Position, Pick up Object from Floor Unable to try/needs assist to keep balance   ?  From Standing Position, Turn to Look Behind Over each Shoulder Needs assist to keep from losing balance and falling   ?  Turn 360 Degrees Needs assistance while turning   ?  Standing  Unsupported, Alternately Place Feet on Step/Stool Needs assistance to keep from falling or unable to try   ?  Standing Unsupported, One Foot in [employer] balance while stepping or standing   ?  Standing on One Leg Unable to try or needs assist to prevent fall   ?  Total Score 11   ?  Berg comment: BERG  < 36 high risk for falls (close to 100%) 46-51 moderate (>50%)   37-45 significant (>80%) 52-55 lower (> 25%)   ? ? ? ?  ?CARDIOVASCULAR RESPONSE: ?07/24/2021:  Functional activity: gait hopping with RW ?Pre-activity vitals: ?HR: 54 ?SpO2: 99 ?Post-activity vitals: ?HR: 57 ?SpO2: 99 ?Modified Borg scale for dyspnea: 1: very mild shortness of breath ?  ?CURRENT PROSTHETIC WEAR ASSESSMENT: ?07/24/2021:  Patient is dependent with: skin check, residual limb care, prosthetic cleaning, ply sock cleaning, correct ply sock adjustment, proper wear schedule/adjustment, and proper weight-bearing schedule/adjustment ?Prosthetic wear tolerance: wearing liner 3-4 hours 2x/day, 7 days/week ?Edema: nonpitting ?Residual limb condition: cylindrical shape, no open areas, invaginated scar posteriorly, normal temperature & color, slight dryness to skin, sensation intact. ?Prosthetic description: [facility] plans to deliver on Thursday, 4/27.  Prosthesis is silicon liner with velcro lanyard suspension, Single axis friction engaging knee with extension assist, flexible keel foot ?K code/activity level with prosthetic UXN:ATFTD 2 basic community with fixed cadence ?  ?Today's Treatment: ?08/16/2021 ?Prosthetic Training with  Transfemoral Prosthesis. ?PT recommended daily wear 5 hrs 2x/day initiating first wear upon arising. Pt verbalized understanding.  ?Sit to/from stand chairs with armrests with goal to not use RW for stabilization a

## 2021-08-21 ENCOUNTER — Encounter: Payer: Medicare Other | Admitting: Physical Therapy

## 2021-08-22 ENCOUNTER — Ambulatory Visit: Payer: Medicare Other | Admitting: Physical Therapy

## 2021-08-22 ENCOUNTER — Encounter: Payer: Self-pay | Admitting: Physical Therapy

## 2021-08-22 DIAGNOSIS — R2681 Unsteadiness on feet: Secondary | ICD-10-CM | POA: Diagnosis not present

## 2021-08-22 DIAGNOSIS — R293 Abnormal posture: Secondary | ICD-10-CM

## 2021-08-22 DIAGNOSIS — M25652 Stiffness of left hip, not elsewhere classified: Secondary | ICD-10-CM

## 2021-08-22 DIAGNOSIS — M6281 Muscle weakness (generalized): Secondary | ICD-10-CM

## 2021-08-22 DIAGNOSIS — R2689 Other abnormalities of gait and mobility: Secondary | ICD-10-CM | POA: Diagnosis not present

## 2021-08-22 NOTE — Therapy (Signed)
OUTPATIENT PHYSICAL THERAPY PROSTHETIC TREATMENT NOTE   Patient Name: Samuel Carney MRN: [id number] DOB:03-19-1946, 76 y.o., male Today's Date: 08/22/2021  PCP: Juluis Pitch, MD REFERRING PROVIDER: Jamse Arn, MD   PT End of Session - 08/22/21 1145     Visit Number 8    Number of Visits 25    Date for PT Re-Evaluation 10/19/21    Authorization Type [employer] Medicare   $20 co-pay    Progress Note Due on Visit 10    PT Start Time 1145    PT Stop Time 1232    PT Time Calculation (min) 47 min    Equipment Utilized During Treatment Gait belt    Activity Tolerance Patient tolerated treatment well    Behavior During Therapy WFL for tasks assessed/performed                   Past Medical History:  Diagnosis Date   Alcohol abuse    Aortic atherosclerosis ([place])    B12 deficiency    Cirrhosis ([place])    COPD (chronic obstructive pulmonary disease) ([place])    Coronary artery disease    GERD (gastroesophageal reflux disease)    Glaucoma    Grade II diastolic dysfunction    History of kidney stones    HLD (hyperlipidemia)    Hx of radiation therapy    Hypertension    Lung mass    Moderate mitral regurgitation    Pneumonia    PVD (peripheral vascular disease) ([place])    Squamous cell carcinoma of lung, right ([place]) 2019   Past Surgical History:  Procedure Laterality Date   COLONOSCOPY WITH PROPOFOL     COLONOSCOPY WITH PROPOFOL N/A 11/25/2018   Procedure: COLONOSCOPY WITH PROPOFOL;  Surgeon: Lollie Sails, MD;  Location: [facility] ENDOSCOPY;  Service: Endoscopy;  Laterality: N/A;   ENDARTERECTOMY FEMORAL Left 07/13/2020   Procedure: ENDARTERECTOMY FEMORAL ( SFA STENT);  Surgeon: Katha Cabal, MD;  Location: [facility] ORS;  Service: Vascular;  Laterality: Left;   ESOPHAGOGASTRODUODENOSCOPY (EGD) WITH PROPOFOL N/A 11/25/2018   Procedure: ESOPHAGOGASTRODUODENOSCOPY (EGD) WITH PROPOFOL;  Surgeon: Lollie Sails, MD;  Location: [facility] ENDOSCOPY;  Service: Endoscopy;   Laterality: N/A;   LEG AMPUTATION ABOVE KNEE Left    LOWER EXTREMITY ANGIOGRAPHY Left 06/07/2020   Procedure: LOWER EXTREMITY ANGIOGRAPHY;  Surgeon: Katha Cabal, MD;  Location: [place] [facility];  Service: Cardiovascular;  Laterality: Left;   LOWER EXTREMITY ANGIOGRAPHY Left 01/10/2021   Procedure: LOWER EXTREMITY ANGIOGRAPHY;  Surgeon: Katha Cabal, MD;  Location: [place] [facility];  Service: Cardiovascular;  Laterality: Left;   Patient Active Problem List   Diagnosis Date Noted   Atherosclerosis of artery of extremity with ulceration ([place]) 07/13/2020   Atherosclerosis of native arteries of the extremities with ulceration ([place]) 57/32/2025   Alcoholic cirrhosis of liver without ascites ([place]) 04/09/2019   Hyperlipidemia, mixed 04/09/2019   Malignant 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 ([place]) 04/09/2019   Thrombocytopenia ([place]) 04/09/2019   CAD (coronary artery disease) 10/14/2017   Lung cancer ([place]) 10/14/2017   Nodule of upper lobe of right lung 09/15/2017   Tobacco abuse 08/14/2017   Benign essential hypertension 09/09/2013    REFERRING DIAG:  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 He is still in pain.  He has been hurting a lot. He is wearing prosthesis ~6hrs 2x/day.    PAIN:  Are you having pain?  NPRS scale:  0/10 sitting & 2/10 standing up to 5/10 Pain location: residual limb Pain description: ache, sore Aggravating factors: liner Relieving factors: removing liner.  OBJECTIVE:    MUSCLE LENGTH: Hamstrings: 07/24/2021:  Right -19 deg; with hip 90* Thomas test:  07/24/2021:  Right -19 deg; Left -27 deg   POSTURE: 07/24/2021:  rounded shoulders, forward head, flexed trunk , and weight shift right   LE ROM:   Active ROM Right 07/24/21 Left 07/24/21  Hip flexion      Hip extension Standing -20* Standing  -30*  Hip abduction      Hip adduction      Hip internal rotation      Hip external rotation      Knee flexion      Knee extension      Ankle dorsiflexion      Ankle plantarflexion      Ankle inversion      Ankle eversion       (Blank rows = not tested)   MMT:   MMT Right 07/24/21 Left 07/24/21  Hip flexion 5/5 4/4  Hip extension 3/5 3-/5  Hip abduction 3/5 3-/5  Hip adduction      Hip internal rotation      Hip external rotation      Knee flexion 3/5 NA  Knee extension 4/5 NA  Ankle dorsiflexion 5/5 NA  Ankle plantarflexion   NA  Ankle inversion   NA  Ankle eversion   NA  (Blank rows = not tested)   TRANSFERS: 07/24/2021:  Sit to stand: Modified independence and requires armrests & use of BUEs 07/24/2021:  Stand to sit: Modified independence and requires armrests & use of BUEs   GAIT: 07/24/2021:  Gait pattern: hopping with good clearance RLE Distance walked: 30' Assistive device utilized: Environmental consultant - 2 wheeled Level of assistance: Modified independence Gait velocity:  Comments:    FUNCTIONAL TESTs:  Merrilee Jansky Balance Scale:  07/31/2021  11/56  [facility] PT Assessment - 07/31/21 0815                Standardized Balance Assessment    Standardized Balance Assessment Berg Balance Test          Berg Balance Test    Sit to Stand Needs minimal aid to stand or to stabilize     Standing Unsupported Needs several tries to stand 30 seconds unsupported     Sitting with Back Unsupported but Feet Supported on Floor or Stool Able to sit safely and securely 2 minutes     Stand to Sit Uses backs of legs against chair to control descent     Transfers Able to transfer safely, definite need of hands     Standing Unsupported with Eyes Closed Needs  help to keep from falling     Standing Unsupported with Feet Together Needs help to attain position and unable to hold for 15 seconds     From Standing, Reach Forward with Outstretched Arm Loses balance while trying/requires external support     From Standing Position, Pick up Object from Floor Unable to try/needs assist to keep balance     From Standing Position, Turn to Look Behind Over each  Shoulder Needs assist to keep from losing balance and falling     Turn 360 Degrees Needs assistance while turning     Standing Unsupported, Alternately Place Feet on Step/Stool Needs assistance to keep from falling or unable to try     Standing Unsupported, One Foot in [employer] balance while stepping or standing     Standing on One Leg Unable to try or needs assist to prevent fall     Total Score 11     Berg comment: BERG  < 36 high risk for falls (close to 100%) 46-51 moderate (>50%)   37-45 significant (>80%) 52-55 lower (> 25%)        CARDIOVASCULAR RESPONSE: 07/24/2021:  Functional activity: gait hopping with RW Pre-activity vitals: HR: 54 SpO2: 99 Post-activity vitals: HR: 57 SpO2: 99 Modified Borg scale for dyspnea: 1: very mild shortness of breath   CURRENT PROSTHETIC WEAR ASSESSMENT: 07/24/2021:  Patient is dependent with: skin check, residual limb care, prosthetic cleaning, ply sock cleaning, correct ply sock adjustment, proper wear schedule/adjustment, and proper weight-bearing schedule/adjustment Prosthetic wear tolerance: wearing liner 3-4 hours 2x/day, 7 days/week Edema: nonpitting Residual limb condition: cylindrical shape, no open areas, invaginated scar posteriorly, normal temperature & color, slight dryness to skin, sensation intact. Prosthetic description: [facility] plans to deliver on Thursday, 4/27.  Prosthesis is silicon liner with velcro lanyard suspension, Single axis friction engaging knee with extension assist, flexible keel foot K code/activity level with prosthetic  XBM:WUXLK 2 basic community with fixed cadence   Today's Treatment: 08/22/2021 Prosthetic Training with Transfemoral Prosthesis: PT reviewed donning including tightening strap during day as his limb seats deeper in socket then the strap will loosen.  If not tightened then his limb will piston which may cause some of his pain. PT reviewed adjusting ply socks including too many may cause "hammocking" which will cause distal limb pain. PT described difference between pressure on limb & pain. He feels he has limb pain with weight bearing.  Pt verbalized understanding of above. PT assessed leg length standing with Rt knee ext and now appears equal.  PT placed 3/8" heel lift in right shoe to make height adjustment more gradual.  Plan is for 1 week at 3/8" and then progress to 1/4" lift which PT issued.  Pt & dtr verbalized understanding. Pt ambulated 100' X 2 with RW with tactile cues for weight shift over prosthesis in stance and initial contact with heel as soon as prosthetic foot swings forward.   His prosthetic knee is too externally rotated with toe-out and results in medial whip.  Pt's dtr to set up another appt with prosthetist.  Neuromuscular Re-ed: Standing feet ~2" apart on floor: eyes open head movements right/left, up/down & diagonals  and static eyes closed 10 sec 3 reps with min guard /tactile cues for balance.  Standing on foam with feet 4" apart with head turns with minA.   08/16/2021 Prosthetic Training with Transfemoral Prosthesis. PT recommended daily wear 5 hrs 2x/day initiating first wear upon arising. Pt verbalized understanding.  Sit to/from stand chairs with armrests with goal to not use RW for stabilization and chairs without armrests using RW. Pt ambulated 73' with RW with supervision & verbal cues and neg ramp with min guard & curb with supervision with RW  & verbal cues on technique. Turn 180* with RW with cues to weight bear thru prosthesis in stance.  Pt ambulated 7' with  LUE on counter & RUE cane stand alone tip with  minA. PT demo,verbal & tactile cues for balance, sequence & wt shift.   Neuromuscular Re-education: Standing feet hip width apart with prosthetic foot 2" back to RLE: eyes open head turns 4 directions and eyes closed static 10sec 3 reps with min guard for both. Min guard & tactile / verbal cues on upright posture & balance reactions: UE resistance red theraband alternating UEs & BUEs 10 reps ea row, forward reach & upward reach.   08/14/2021 Prosthetic Training with Transfemoral Prosthesis. PT recommended daily wear 4-5 hrs 2x/day initiating first wear upon arising. Pt verbalized understanding.  Sit to/from stand from w/c with goal to not use RW for stabilization.  Pt ambulated 110' with RW with supervision & verbal cues.  Pt neg ramp & curb with RW with min guard & verbal cues on technique. Turn 180* with RW with cues to weight bear thru prosthesis in stance.   Neuromuscular Re-education: Standing feet hip width apart with prosthetic foot 2" back to RLE: eyes open head turns 4 directions and eyes closed static 10sec 3 reps with min guard for both. MinA & tactile / verbal cues on upright posture & balance reactions: UE resistance red theraband alternating UEs & BUEs 10 reps ea row, forward reach & upward reach.  Standing with back to door frame with RW in front: reaching single UE overhead & BUEs overhead hold for 2 deep breathes 2 reps ea. Pt recommended as HEP with high frequency like when exiting bathroom.  Target is upright posture.  PT demo proper upright posture.  Pt & dtr verbalized understanding.     08/08/21 1106  PT Education  Education Details HEP at [employer]) Educated Patient;Child(ren)  Methods Explanation;Demonstration;Tactile cues;Verbal cues;Handout  Comprehension Returned demonstration;Tactile cues required;Verbalized understanding;Verbal cues required;Need further instruction    PATIENT EDUCATION: Education details:  Skin check, Prosthetic cleaning, Proper donning including tightening suspension strap, Proper wear schedule/adjustment, positioning in sitting and sit/stand  Person educated: Patient and Spouse Education method: Explanation, Demonstration, Tactile cues, Verbal cues, and Handouts Education comprehension: verbalized understanding, returned demonstration, verbal cues required, tactile cues required, and needs further education   ASSESSMENT:   CLINICAL IMPRESSION: Patient's residual limb pain is limiting weight bearing tolerance.  He required less assistance for gait uses excessive BUE weight bearing due to pain.  PT worked on balance which pt improved with instruction & balance.    OBJECTIVE IMPAIRMENTS Abnormal gait, cardiopulmonary status limiting activity, decreased activity tolerance, decreased balance, decreased endurance, decreased knowledge of use of DME, decreased mobility, decreased ROM, decreased strength, impaired flexibility, postural dysfunction, and prosthetic dependency .    ACTIVITY LIMITATIONS community activity, driving, and church.    PERSONAL FACTORS Age, Fitness, Time since onset of injury/illness/exacerbation, and 3+ comorbidities: see PMH  are also affecting patient's functional outcome.    REHAB POTENTIAL: Good   CLINICAL DECISION MAKING: Evolving/moderate complexity   EVALUATION COMPLEXITY: Moderate     GOALS: Goals reviewed with patient? Yes   SHORT TERM GOALS: Target date: 08/24/2021   Patient donnes prosthesis modified independent & verbalizes proper cleaning. Goal status: Met 08/22/21 2.  Patient tolerates prosthesis >10 hrs total /day without skin issues or limb pain <3/10 after standing. Goal status: Partially Met 08/22/21 time met but not pain level   3.  Patient able to reach 7" and look over both shoulders without UE support with supervision. Goal status: Met 08/22/21   4. Patient ambulates 12' with RW & prosthesis with supervision. Goal status: Met  08/22/21  5. Patient negotiates ramps & curbs with RW & prosthesis with minA. Goal status: ongoing   LONG TERM GOALS: Target date: 10/19/2021   Patient demonstrates & verbalized understanding of prosthetic care to enable safe utilization of prosthesis. Baseline: SEE OBJECTIVE DATA Goal status: INITIAL   Patient tolerates prosthesis wear >90% of awake hours without skin or limb pain issues. Baseline: SEE OBJECTIVE DATA Goal status: INITIAL   Berg Balance </= 36/56 to indicate lower fall risk Baseline: SEE OBJECTIVE DATA Goal status: INITIAL   Patient ambulates >300' with LRAD & prosthesis safely independently for community mobility. Baseline: SEE OBJECTIVE DATA Goal status: INITIAL   Patient negotiates ramps, curbs & stairs with single rail with LRAD & prosthesis independently for community mobility. Baseline: SEE OBJECTIVE DATA Goal status: INITIAL   PLAN: PT FREQUENCY: 2x/week   PT DURATION: 12 weeks   PLANNED INTERVENTIONS: Therapeutic exercises, Therapeutic activity, Neuromuscular re-education, Balance training, Gait training, Patient/Family education, Stair training, Vestibular training, Prosthetic training, DME instructions, and scar mobilization   PLAN FOR NEXT SESSION:  check remaining STG of ramp & curb, set updated STGs, check limb pain,  progress prosthetic gait with RW including ramp & curb, gait with cane near counter if tolerates weight bearing, balance activities.    Jamey Reas, PT, DPT 08/22/2021, 4:22 PM

## 2021-08-23 ENCOUNTER — Ambulatory Visit: Payer: Medicare Other | Admitting: Physical Therapy

## 2021-08-23 ENCOUNTER — Encounter: Payer: Self-pay | Admitting: Physical Therapy

## 2021-08-23 ENCOUNTER — Encounter: Payer: Medicare Other | Admitting: Physical Therapy

## 2021-08-23 DIAGNOSIS — R2681 Unsteadiness on feet: Secondary | ICD-10-CM

## 2021-08-23 DIAGNOSIS — M25652 Stiffness of left hip, not elsewhere classified: Secondary | ICD-10-CM

## 2021-08-23 DIAGNOSIS — R2689 Other abnormalities of gait and mobility: Secondary | ICD-10-CM

## 2021-08-23 DIAGNOSIS — R293 Abnormal posture: Secondary | ICD-10-CM

## 2021-08-23 DIAGNOSIS — M6281 Muscle weakness (generalized): Secondary | ICD-10-CM

## 2021-08-23 NOTE — Therapy (Signed)
OUTPATIENT PHYSICAL THERAPY PROSTHETIC TREATMENT NOTE   Patient Name: Samuel Carney MRN: [id number] DOB:08-Dec-1945, 76 y.o., male Today's Date: 08/23/2021  PCP: Juluis Pitch, MD REFERRING PROVIDER: Jamse Arn, MD   PT End of Session - 08/23/21 0933     Visit Number 9    Number of Visits 25    Date for PT Re-Evaluation 10/19/21    Authorization Type [employer] Medicare   $20 co-pay    Progress Note Due on Visit 10    PT Start Time 0930    PT Stop Time 1015    PT Time Calculation (min) 45 min    Equipment Utilized During Treatment Gait belt    Activity Tolerance Patient tolerated treatment well    Behavior During Therapy WFL for tasks assessed/performed                    Past Medical History:  Diagnosis Date   Alcohol abuse    Aortic atherosclerosis ([place])    B12 deficiency    Cirrhosis ([place])    COPD (chronic obstructive pulmonary disease) ([place])    Coronary artery disease    GERD (gastroesophageal reflux disease)    Glaucoma    Grade II diastolic dysfunction    History of kidney stones    HLD (hyperlipidemia)    Hx of radiation therapy    Hypertension    Lung mass    Moderate mitral regurgitation    Pneumonia    PVD (peripheral vascular disease) ([place])    Squamous cell carcinoma of lung, right ([place]) 2019   Past Surgical History:  Procedure Laterality Date   COLONOSCOPY WITH PROPOFOL     COLONOSCOPY WITH PROPOFOL N/A 11/25/2018   Procedure: COLONOSCOPY WITH PROPOFOL;  Surgeon: Lollie Sails, MD;  Location: [facility] ENDOSCOPY;  Service: Endoscopy;  Laterality: N/A;   ENDARTERECTOMY FEMORAL Left 07/13/2020   Procedure: ENDARTERECTOMY FEMORAL ( SFA STENT);  Surgeon: Katha Cabal, MD;  Location: [facility] ORS;  Service: Vascular;  Laterality: Left;   ESOPHAGOGASTRODUODENOSCOPY (EGD) WITH PROPOFOL N/A 11/25/2018   Procedure: ESOPHAGOGASTRODUODENOSCOPY (EGD) WITH PROPOFOL;  Surgeon: Lollie Sails, MD;  Location: [facility] ENDOSCOPY;  Service: Endoscopy;   Laterality: N/A;   LEG AMPUTATION ABOVE KNEE Left    LOWER EXTREMITY ANGIOGRAPHY Left 06/07/2020   Procedure: LOWER EXTREMITY ANGIOGRAPHY;  Surgeon: Katha Cabal, MD;  Location: [place] CV LAB;  Service: Cardiovascular;  Laterality: Left;   LOWER EXTREMITY ANGIOGRAPHY Left 01/10/2021   Procedure: LOWER EXTREMITY ANGIOGRAPHY;  Surgeon: Katha Cabal, MD;  Location: [place] CV LAB;  Service: Cardiovascular;  Laterality: Left;   Patient Active Problem List   Diagnosis Date Noted   Atherosclerosis of artery of extremity with ulceration ([place]) 07/13/2020   Atherosclerosis of native arteries of the extremities with ulceration ([place]) 81/82/9937   Alcoholic cirrhosis of liver without ascites ([place]) 04/09/2019   Hyperlipidemia, mixed 04/09/2019   Malignant 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 ([place]) 04/09/2019   Thrombocytopenia ([place]) 04/09/2019   CAD (coronary artery disease) 10/14/2017   Lung cancer ([place]) 10/14/2017   Nodule of upper lobe of right lung 09/15/2017   Tobacco abuse 08/14/2017   Benign essential hypertension 09/09/2013    REFERRING DIAG:  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  The prosthesis feels better.  PAIN:  Are you having pain?  NPRS scale:  0/10 sitting & just tight when standing. Pain location: residual limb Pain description: ache, sore Aggravating factors: liner Relieving factors: removing liner.  OBJECTIVE:    MUSCLE LENGTH: Hamstrings: 07/24/2021:  Right -19 deg; with hip 90* Thomas test: 07/24/2021:  Right -19 deg; Left -27 deg    POSTURE: 07/24/2021:  rounded shoulders, forward head, flexed trunk , and weight shift right   LE ROM:   Active ROM Right 07/24/21 Left 07/24/21  Hip flexion      Hip extension Standing -20* Standing  -30*  Hip abduction      Hip adduction      Hip internal rotation      Hip external rotation      Knee flexion      Knee extension      Ankle dorsiflexion      Ankle plantarflexion      Ankle inversion      Ankle eversion       (Blank rows = not tested)   MMT:   MMT Right 07/24/21 Left 07/24/21  Hip flexion 5/5 4/4  Hip extension 3/5 3-/5  Hip abduction 3/5 3-/5  Hip adduction      Hip internal rotation      Hip external rotation      Knee flexion 3/5 NA  Knee extension 4/5 NA  Ankle dorsiflexion 5/5 NA  Ankle plantarflexion   NA  Ankle inversion   NA  Ankle eversion   NA  (Blank rows = not tested)   TRANSFERS: 07/24/2021:  Sit to stand: Modified independence and requires armrests & use of BUEs 07/24/2021:  Stand to sit: Modified independence and requires armrests & use of BUEs   GAIT: 07/24/2021:  Gait pattern: hopping with good clearance RLE Distance walked: 30' Assistive device utilized: Environmental consultant - 2 wheeled Level of assistance: Modified independence Gait velocity:  Comments:    FUNCTIONAL TESTs:  Merrilee Jansky Balance Scale:  07/31/2021  11/56  [facility] PT Assessment - 07/31/21 0815                Standardized Balance Assessment    Standardized Balance Assessment Berg Balance Test          Berg Balance Test    Sit to Stand Needs minimal aid to stand or to stabilize     Standing Unsupported Needs several tries to stand 30 seconds unsupported     Sitting with Back Unsupported but Feet Supported on Floor or Stool Able to sit safely and securely 2 minutes     Stand to Sit Uses backs of legs against chair to control descent     Transfers Able to transfer safely, definite need of hands     Standing Unsupported with Eyes Closed Needs help to keep from falling     Standing  Unsupported with Feet Together Needs help to attain position and unable to hold for 15 seconds     From Standing, Reach Forward with Outstretched Arm Loses balance while trying/requires external support     From Standing Position, Pick up Object from Floor Unable to try/needs assist to keep balance     From Standing Position, Turn to Look Behind Over each Shoulder Needs assist to keep from losing balance and falling     Turn  360 Degrees Needs assistance while turning     Standing Unsupported, Alternately Place Feet on Step/Stool Needs assistance to keep from falling or unable to try     Standing Unsupported, One Foot in [employer] balance while stepping or standing     Standing on One Leg Unable to try or needs assist to prevent fall     Total Score 11     Berg comment: BERG  < 36 high risk for falls (close to 100%) 46-51 moderate (>50%)   37-45 significant (>80%) 52-55 lower (> 25%)        CARDIOVASCULAR RESPONSE: 07/24/2021:  Functional activity: gait hopping with RW Pre-activity vitals: HR: 54 SpO2: 99 Post-activity vitals: HR: 57 SpO2: 99 Modified Borg scale for dyspnea: 1: very mild shortness of breath   CURRENT PROSTHETIC WEAR ASSESSMENT: 07/24/2021:  Patient is dependent with: skin check, residual limb care, prosthetic cleaning, ply sock cleaning, correct ply sock adjustment, proper wear schedule/adjustment, and proper weight-bearing schedule/adjustment Prosthetic wear tolerance: wearing liner 3-4 hours 2x/day, 7 days/week Edema: nonpitting Residual limb condition: cylindrical shape, no open areas, invaginated scar posteriorly, normal temperature & color, slight dryness to skin, sensation intact. Prosthetic description: [facility] plans to deliver on Thursday, 4/27.  Prosthesis is silicon liner with velcro lanyard suspension, Single axis friction engaging knee with extension assist, flexible keel foot K code/activity level with prosthetic QAS:TMHDQ 2 basic community with fixed  cadence   Today's Treatment: 08/23/2021 Prosthetic Training with Transfemoral Prosthesis: Pt amb with RW 50' SBA and neg ramp min guard & curb SBA. Cues for technique. Pt exited amb with RW 120' with wife following with w/c. PT continues to recommend beginning to walk in & out of community buildings with family following with w/c  to build endurance / activity tolerance. Pt & wife verbalized understanding.   Continue wear all awake hours except 2 hours midday. Pt verbalized understanding.   Neuromuscular Re-ed: Standing performing UE green theraband resistance for balance: alternating UEs & BUEs 10 reps ea rows, forward reach and upward reach with minA / min guard.  Sit to/from stand from chairs with armrests using UEs but not touching RW for >/= 5 sec upon arising & prior to sitting.  In //bars with intermittent touch moving prosthesis forward back to bilateral stance, abduction back to bilateral stance and ext (only able to move back ~2") for 5 reps. Standing leaning forward placing Bil hands on chair bottom to upright without UE assist with PT minA / min guard 10 reps.   08/22/2021 Prosthetic Training with Transfemoral Prosthesis: PT reviewed donning including tightening strap during day as his limb seats deeper in socket then the strap will loosen.  If not tightened then his limb will piston which may cause some of his pain. PT reviewed adjusting ply socks including too many may cause "hammocking" which will cause distal limb pain. PT described difference between pressure on limb & pain. He feels he has limb pain with weight bearing.  Pt verbalized understanding of above. PT assessed leg length standing with Rt knee ext and now appears equal.  PT placed 3/8" heel lift in right shoe to make height adjustment more gradual.  Plan is for 1 week at 3/8" and then progress to 1/4" lift which PT issued.  Pt & dtr verbalized understanding. Pt ambulated 100' X 2 with RW with tactile cues for weight  shift over prosthesis in stance and initial contact with heel as soon as prosthetic foot swings forward.  His prosthetic knee is too externally rotated with toe-out and results in medial whip.  Pt's dtr to set up another appt with prosthetist.  Neuromuscular Re-ed: Standing feet ~2" apart on floor: eyes open head movements right/left, up/down & diagonals  and static eyes closed 10 sec 3 reps with min guard /tactile cues for balance.  Standing on foam with feet 4" apart with head turns with minA.   08/16/2021 Prosthetic Training with Transfemoral Prosthesis. PT recommended daily wear 5 hrs 2x/day initiating first wear upon arising. Pt verbalized understanding.  Sit to/from stand chairs with armrests with goal to not use RW for stabilization and chairs without armrests using RW. Pt ambulated 76' with RW with supervision & verbal cues and neg ramp with min guard & curb with supervision with RW  & verbal cues on technique. Turn 180* with RW with cues to weight bear thru prosthesis in stance.  Pt ambulated 7' with LUE on counter & RUE cane stand alone tip with minA. PT demo,verbal & tactile cues for balance, sequence & wt shift.   Neuromuscular Re-education: Standing feet hip width apart with prosthetic foot 2" back to RLE: eyes open head turns 4 directions and eyes closed static 10sec 3 reps with min guard for both. Min guard & tactile / verbal cues on upright posture & balance reactions: UE resistance red theraband alternating UEs & BUEs 10 reps ea row, forward reach & upward reach.      08/08/21 1106  PT Education  Education Details [facility] at [employer]) Educated Patient;Child(ren)  Methods Explanation;Demonstration;Tactile cues;Verbal cues;Handout  Comprehension Returned demonstration;Tactile cues required;Verbalized understanding;Verbal cues required;Need further instruction    PATIENT EDUCATION: Education details: Skin check, Prosthetic cleaning, Proper donning including tightening  suspension strap, Proper wear schedule/adjustment, positioning in sitting and sit/stand  Person educated: Patient and Spouse Education method: Explanation, Demonstration, Tactile cues, Verbal cues, and Handouts Education comprehension: verbalized understanding, returned demonstration, verbal cues required, tactile cues required, and needs further education   ASSESSMENT:   CLINICAL IMPRESSION: Patient's residual limb pain was improved after prosthetist's modifications. Pt improved prosthetic gait with RW. His balance is improving but is high fall risk without UE support.    OBJECTIVE IMPAIRMENTS Abnormal gait, cardiopulmonary status limiting activity, decreased activity tolerance, decreased balance, decreased endurance, decreased knowledge of use of DME, decreased mobility, decreased ROM, decreased strength, impaired flexibility, postural dysfunction, and prosthetic dependency .    ACTIVITY LIMITATIONS community activity, driving, and church.    PERSONAL FACTORS Age, Fitness, Time since onset of injury/illness/exacerbation, and 3+ comorbidities: see PMH  are also affecting patient's functional outcome.    REHAB POTENTIAL: Good   CLINICAL DECISION MAKING: Evolving/moderate complexity   EVALUATION COMPLEXITY: Moderate     GOALS: Goals reviewed with patient? Yes   SHORT TERM GOALS: Target date: 08/24/2021   Patient donnes prosthesis modified independent & verbalizes proper cleaning. Goal status: Met 08/22/21 2.  Patient tolerates prosthesis >10 hrs total /day without skin issues or limb pain <3/10 after standing. Goal status: Partially Met 08/22/21 time met but not pain level   3.  Patient able to reach 7" and look over both shoulders without UE support with supervision. Goal status: Met 08/22/21   4. Patient ambulates 72' with RW & prosthesis with supervision. Goal status: Met 08/22/21   5. Patient negotiates ramps & curbs with RW & prosthesis with minA. Goal status: MET 08/23/21    SHORT TERM GOALS: Target date: 09/23/2021   Patient verbalizes proper adjustment of ply  socks.  Goal status: ongoing 2.  Patient tolerates prosthesis >90% of awake hours daily without skin issues or limb pain <3/10 after standing. Goal status: ongoing   3.  Patient able to pick up items from floor without UE support with supervision. Goal status: ongoing   4. Patient ambulates 100' with cane & prosthesis with modA / HHA.  Goal status: ongoing    LONG TERM GOALS: Target date: 10/19/2021   Patient demonstrates & verbalized understanding of prosthetic care to enable safe utilization of prosthesis. Baseline: SEE OBJECTIVE DATA Goal status: INITIAL   Patient tolerates prosthesis wear >90% of awake hours without skin or limb pain issues. Baseline: SEE OBJECTIVE DATA Goal status: INITIAL   Berg Balance </= 36/56 to indicate lower fall risk Baseline: SEE OBJECTIVE DATA Goal status: INITIAL   Patient ambulates >300' with LRAD & prosthesis safely independently for community mobility. Baseline: SEE OBJECTIVE DATA Goal status: INITIAL   Patient negotiates ramps, curbs & stairs with single rail with LRAD & prosthesis independently for community mobility. Baseline: SEE OBJECTIVE DATA Goal status: INITIAL   PLAN: PT FREQUENCY: 2x/week   PT DURATION: 12 weeks   PLANNED INTERVENTIONS: Therapeutic exercises, Therapeutic activity, Neuromuscular re-education, Balance training, Gait training, Patient/Family education, Stair training, Vestibular training, Prosthetic training, DME instructions, and scar mobilization   PLAN FOR NEXT SESSION:  10th visit progress note to MD. Continue to encourage progressive prosthetic gait with RW including ramp & curb, gait with cane near counter if tolerates weight bearing, balance activities.    Jamey Reas, PT, DPT 08/23/2021, 11:23 AM

## 2021-08-29 ENCOUNTER — Encounter: Payer: Medicare Other | Admitting: Physical Therapy

## 2021-08-30 ENCOUNTER — Encounter: Payer: Self-pay | Admitting: Physical Therapy

## 2021-08-30 ENCOUNTER — Ambulatory Visit (INDEPENDENT_AMBULATORY_CARE_PROVIDER_SITE_OTHER): Payer: Medicare Other | Admitting: Physical Therapy

## 2021-08-30 DIAGNOSIS — M6281 Muscle weakness (generalized): Secondary | ICD-10-CM | POA: Diagnosis not present

## 2021-08-30 DIAGNOSIS — R2689 Other abnormalities of gait and mobility: Secondary | ICD-10-CM

## 2021-08-30 DIAGNOSIS — M25652 Stiffness of left hip, not elsewhere classified: Secondary | ICD-10-CM

## 2021-08-30 DIAGNOSIS — R2681 Unsteadiness on feet: Secondary | ICD-10-CM

## 2021-08-30 DIAGNOSIS — R293 Abnormal posture: Secondary | ICD-10-CM

## 2021-08-30 NOTE — Therapy (Signed)
OUTPATIENT PHYSICAL THERAPY PROSTHETIC TREATMENT NOTE & 10th Visit Progress Note   Patient Name: Samuel Carney MRN: [id number] DOB:10/22/1945, 76 y.o., male Today's Date: 08/30/2021  PCP: Juluis Pitch, MD REFERRING PROVIDER: Jamse Arn, MD   PT End of Session - 08/30/21 0935     Visit Number 10    Number of Visits 25    Date for PT Re-Evaluation 10/19/21    Authorization Type [employer] Medicare   $20 co-pay    Progress Note Due on Visit 10    PT Start Time 0931    PT Stop Time 1014    PT Time Calculation (min) 43 min    Equipment Utilized During Treatment Gait belt    Activity Tolerance Patient tolerated treatment well    Behavior During Therapy [facility] for tasks assessed/performed           Progress Note Reporting Period 07/24/2021 to 08/30/2021  See note below for Objective Data and Assessment of Progress/Goals.              Past Medical History:  Diagnosis Date   Alcohol abuse    Aortic atherosclerosis (HCC)    B12 deficiency    Cirrhosis (HCC)    COPD (chronic obstructive pulmonary disease) (HCC)    Coronary artery disease    GERD (gastroesophageal reflux disease)    Glaucoma    Grade II diastolic dysfunction    History of kidney stones    HLD (hyperlipidemia)    Hx of radiation therapy    Hypertension    Lung mass    Moderate mitral regurgitation    Pneumonia    PVD (peripheral vascular disease) (HCC)    Squamous cell carcinoma of lung, right ([place]) 2019   Past Surgical History:  Procedure Laterality Date   COLONOSCOPY WITH PROPOFOL     COLONOSCOPY WITH PROPOFOL N/A 11/25/2018   Procedure: COLONOSCOPY WITH PROPOFOL;  Surgeon: Lollie Sails, MD;  Location: [facility] ENDOSCOPY;  Service: Endoscopy;  Laterality: N/A;   ENDARTERECTOMY FEMORAL Left 07/13/2020   Procedure: ENDARTERECTOMY FEMORAL ( SFA STENT);  Surgeon: Katha Cabal, MD;  Location: [facility] ORS;  Service: Vascular;  Laterality: Left;   ESOPHAGOGASTRODUODENOSCOPY (EGD) WITH  PROPOFOL N/A 11/25/2018   Procedure: ESOPHAGOGASTRODUODENOSCOPY (EGD) WITH PROPOFOL;  Surgeon: Lollie Sails, MD;  Location: [facility] ENDOSCOPY;  Service: Endoscopy;  Laterality: N/A;   LEG AMPUTATION ABOVE KNEE Left    LOWER EXTREMITY ANGIOGRAPHY Left 06/07/2020   Procedure: LOWER EXTREMITY ANGIOGRAPHY;  Surgeon: Katha Cabal, MD;  Location: [place] CV LAB;  Service: Cardiovascular;  Laterality: Left;   LOWER EXTREMITY ANGIOGRAPHY Left 01/10/2021   Procedure: LOWER EXTREMITY ANGIOGRAPHY;  Surgeon: Katha Cabal, MD;  Location: [place] [facility];  Service: Cardiovascular;  Laterality: Left;   Patient Active Problem List   Diagnosis Date Noted   Atherosclerosis of artery of extremity with ulceration ([place]) 07/13/2020   Atherosclerosis of native arteries of the extremities with ulceration ([place]) 81/27/5170   Alcoholic cirrhosis of liver without ascites ([place]) 04/09/2019   Hyperlipidemia, mixed 04/09/2019   Malignant 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 ([place]) 04/09/2019   Thrombocytopenia ([place]) 04/09/2019   CAD (coronary artery disease) 10/14/2017   Lung cancer ([place]) 10/14/2017   Nodule of upper lobe of right lung 09/15/2017   Tobacco abuse 08/14/2017   Benign essential hypertension 09/09/2013    REFERRING DIAG:  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    PAIN:  Are you having pain?  NPRS scale:   0/10 sitting & 2/10 when standing. Pain location: residual limb Pain description: ache, sore Aggravating factors: liner Relieving factors: removing liner.  OBJECTIVE:     MUSCLE LENGTH: Hamstrings: 07/24/2021:  Right -19 deg; with hip 90* Thomas test: 07/24/2021:  Right -19 deg; Left -27 deg   POSTURE: 07/24/2021:  rounded shoulders, forward head, flexed trunk , and weight shift right   LE ROM:   Active ROM Right 07/24/21 Left 07/24/21  Hip flexion      Hip extension Standing -20* Standing  -30*  Hip abduction      Hip adduction      Hip internal rotation      Hip external rotation      Knee flexion      Knee extension      Ankle dorsiflexion      Ankle plantarflexion      Ankle inversion      Ankle eversion       (Blank rows = not tested)   MMT:   MMT Right 07/24/21 Left 07/24/21  Hip flexion 5/5 4/4  Hip extension 3/5 3-/5  Hip abduction 3/5 3-/5  Hip adduction      Hip internal rotation      Hip external rotation      Knee flexion 3/5 NA  Knee extension 4/5 NA  Ankle dorsiflexion 5/5 NA  Ankle plantarflexion   NA  Ankle inversion   NA  Ankle eversion   NA  (Blank rows = not tested)   TRANSFERS: 07/24/2021:  Sit to stand: Modified independence and requires armrests & use of BUEs 07/24/2021:  Stand to sit: Modified independence and requires armrests & use of BUEs   GAIT: 07/24/2021:  Gait pattern: hopping with good clearance RLE Distance walked: 30' Assistive device utilized: Environmental consultant - 2 wheeled Level of assistance: Modified independence Gait velocity:  Comments:    FUNCTIONAL TESTs:  Merrilee Jansky Balance Scale:  07/31/2021  11/56  [facility] PT Assessment - 07/31/21 0815                Standardized Balance Assessment    Standardized Balance Assessment Berg Balance Test          Berg Balance Test    Sit to Stand Needs minimal aid to stand or to stabilize     Standing Unsupported Needs several tries to stand 30 seconds unsupported     Sitting with Back Unsupported but Feet Supported on Floor or Stool Able to sit safely and securely 2 minutes     Stand to Sit Uses backs of legs against chair to control descent     Transfers Able to  transfer safely, definite need of hands     Standing Unsupported with Eyes Closed Needs help to keep from falling     Standing Unsupported with Feet Together Needs help to attain position and unable to hold for 15 seconds     From Standing, Reach Forward with Outstretched Arm Loses balance while trying/requires external support     From Standing Position, Pick up Object from Floor Unable to try/needs assist to keep balance  From Standing Position, Turn to Look Behind Over each Shoulder Needs assist to keep from losing balance and falling     Turn 360 Degrees Needs assistance while turning     Standing Unsupported, Alternately Place Feet on Step/Stool Needs assistance to keep from falling or unable to try     Standing Unsupported, One Foot in [employer] balance while stepping or standing     Standing on One Leg Unable to try or needs assist to prevent fall     Total Score 11     Berg comment: BERG  < 36 high risk for falls (close to 100%) 46-51 moderate (>50%)   37-45 significant (>80%) 52-55 lower (> 25%)        CARDIOVASCULAR RESPONSE: 07/24/2021:  Functional activity: gait hopping with RW Pre-activity vitals: HR: 54 SpO2: 99 Post-activity vitals: HR: 57 SpO2: 99 Modified Borg scale for dyspnea: 1: very mild shortness of breath   CURRENT PROSTHETIC WEAR ASSESSMENT: 07/24/2021:  Patient is dependent with: skin check, residual limb care, prosthetic cleaning, ply sock cleaning, correct ply sock adjustment, proper wear schedule/adjustment, and proper weight-bearing schedule/adjustment Prosthetic wear tolerance: wearing liner 3-4 hours 2x/day, 7 days/week Edema: nonpitting Residual limb condition: cylindrical shape, no open areas, invaginated scar posteriorly, normal temperature & color, slight dryness to skin, sensation intact. Prosthetic description: [facility] plans to deliver on Thursday, 4/27.  Prosthesis is silicon liner with velcro lanyard suspension, Single axis friction  engaging knee with extension assist, flexible keel foot K code/activity level with prosthetic SNK:[id number] 2 basic community with fixed cadence   Today's Treatment: 08/30/2021 Prosthetic Training with Transfemoral Prosthesis: Pt amb with RW 50' safely with general cues to improve posture and weight shift over prosthesis in stance and neg ramp min SBA & curb SBA. Cues for technique. PT continues to recommend beginning to walk in & out of community buildings with family following with w/c  to build endurance / activity tolerance. Pt & wife verbalized understanding.   Continue wear all awake hours except 2-3 hours midday. Pt verbalized understanding.  Pt ambulated along counter LUE & cane stand alone tip RUE 8' X 4 with cues on step length, wt shift over prosthesis & upright posture.  Pt ambulated 30' with cane & HHA modA.   Neuromuscular Re-ed: Sit to/from stand from chairs with armrests using UEs but not touching RW for >/= 5 sec upon arising & prior to sitting.  Sit to/from stand using cane in LUE for balance with minA & cues. Pt ambulated backwards with counter & cane 8' X 4 with min guard.  RW support: worked on sidestepping & backwards gait and turning incorporating prosthesis with cues & SBA.  08/23/2021 Prosthetic Training with Transfemoral Prosthesis: Pt amb with RW 50' SBA and neg ramp min guard & curb SBA. Cues for technique. Pt exited amb with RW 120' with wife following with w/c. PT continues to recommend beginning to walk in & out of community buildings with family following with w/c  to build endurance / activity tolerance. Pt & wife verbalized understanding.   Continue wear all awake hours except 2 hours midday. Pt verbalized understanding.   Neuromuscular Re-ed: Standing performing UE green theraband resistance for balance: alternating UEs & BUEs 10 reps ea rows, forward reach and upward reach with minA / min guard.  Sit to/from stand from chairs with armrests using UEs but not  touching RW for >/= 5 sec upon arising & prior to sitting.  In //bars with intermittent touch  moving prosthesis forward back to bilateral stance, abduction back to bilateral stance and ext (only able to move back ~2") for 5 reps. Standing leaning forward placing Bil hands on chair bottom to upright without UE assist with PT minA / min guard 10 reps.   08/22/2021 Prosthetic Training with Transfemoral Prosthesis: PT reviewed donning including tightening strap during day as his limb seats deeper in socket then the strap will loosen.  If not tightened then his limb will piston which may cause some of his pain. PT reviewed adjusting ply socks including too many may cause "hammocking" which will cause distal limb pain. PT described difference between pressure on limb & pain. He feels he has limb pain with weight bearing.  Pt verbalized understanding of above. PT assessed leg length standing with Rt knee ext and now appears equal.  PT placed 3/8" heel lift in right shoe to make height adjustment more gradual.  Plan is for 1 week at 3/8" and then progress to 1/4" lift which PT issued.  Pt & dtr verbalized understanding. Pt ambulated 100' X 2 with RW with tactile cues for weight shift over prosthesis in stance and initial contact with heel as soon as prosthetic foot swings forward.   His prosthetic knee is too externally rotated with toe-out and results in medial whip.  Pt's dtr to set up another appt with prosthetist.  Neuromuscular Re-ed: Standing feet ~2" apart on floor: eyes open head movements right/left, up/down & diagonals  and static eyes closed 10 sec 3 reps with min guard /tactile cues for balance.  Standing on foam with feet 4" apart with head turns with minA.      08/08/21 1106  PT Education  Education Details [facility] at [employer]) Educated Patient;Child(ren)  Methods Explanation;Demonstration;Tactile cues;Verbal cues;Handout  Comprehension Returned demonstration;Tactile cues  required;Verbalized understanding;Verbal cues required;Need further instruction     ASSESSMENT:   CLINICAL IMPRESSION: Pt. has attended 10 visits since last progress note.  See objective data for updated information.  Pt. has made gains towards improving his mobility with prosthesis for community accessibility without w/c.  Pt. may continue to benefit from skilled PT services to continue progression towards reaching established goals and reduced risk of falls due to presentation.   OBJECTIVE IMPAIRMENTS Abnormal gait, cardiopulmonary status limiting activity, decreased activity tolerance, decreased balance, decreased endurance, decreased knowledge of use of DME, decreased mobility, decreased ROM, decreased strength, impaired flexibility, postural dysfunction, and prosthetic dependency .    ACTIVITY LIMITATIONS community activity, driving, and church.    PERSONAL FACTORS Age, Fitness, Time since onset of injury/illness/exacerbation, and 3+ comorbidities: see PMH  are also affecting patient's functional outcome.    REHAB POTENTIAL: Good   CLINICAL DECISION MAKING: Evolving/moderate complexity   EVALUATION COMPLEXITY: Moderate     GOALS: Goals reviewed with patient? Yes   SHORT TERM GOALS: Target date: 08/24/2021   Patient donnes prosthesis modified independent & verbalizes proper cleaning. Goal status: Met 08/22/21 2.  Patient tolerates prosthesis >10 hrs total /day without skin issues or limb pain <3/10 after standing. Goal status: Partially Met 08/22/21 time met but not pain level   3.  Patient able to reach 7" and look over both shoulders without UE support with supervision. Goal status: Met 08/22/21   4. Patient ambulates 61' with RW & prosthesis with supervision. Goal status: Met 08/22/21   5. Patient negotiates ramps & curbs with RW & prosthesis with minA. Goal status: MET 08/23/21   SHORT TERM GOALS: Target date:  09/23/2021   Patient verbalizes proper adjustment of ply socks.   Goal status: ongoing 2.  Patient tolerates prosthesis >90% of awake hours daily without skin issues or limb pain <3/10 after standing. Goal status: ongoing   3.  Patient able to pick up items from floor without UE support with supervision. Goal status: ongoing   4. Patient ambulates 100' with cane & prosthesis with modA / HHA.  Goal status: ongoing    LONG TERM GOALS: Target date: 10/19/2021   Patient demonstrates & verbalized understanding of prosthetic care to enable safe utilization of prosthesis. Baseline: SEE OBJECTIVE DATA Goal status: INITIAL   Patient tolerates prosthesis wear >90% of awake hours without skin or limb pain issues. Baseline: SEE OBJECTIVE DATA Goal status: INITIAL   Berg Balance </= 36/56 to indicate lower fall risk Baseline: SEE OBJECTIVE DATA Goal status: INITIAL   Patient ambulates >300' with LRAD & prosthesis safely independently for community mobility. Baseline: SEE OBJECTIVE DATA Goal status: INITIAL   Patient negotiates ramps, curbs & stairs with single rail with LRAD & prosthesis independently for community mobility. Baseline: SEE OBJECTIVE DATA Goal status: INITIAL   PLAN: PT FREQUENCY: 2x/week   PT DURATION: 12 weeks   PLANNED INTERVENTIONS: Therapeutic exercises, Therapeutic activity, Neuromuscular re-education, Balance training, Gait training, Patient/Family education, Stair training, Vestibular training, Prosthetic training, DME instructions, and scar mobilization   PLAN FOR NEXT SESSION:  Continue to encourage progressive prosthetic gait with RW including ramp & curb, gait with cane near counter if tolerates weight bearing, balance activities.    Jamey Reas, PT, DPT 08/30/2021, 12:52 PM

## 2021-08-31 ENCOUNTER — Encounter: Payer: Self-pay | Admitting: Physical Therapy

## 2021-08-31 ENCOUNTER — Ambulatory Visit: Payer: Medicare Other | Admitting: Physical Therapy

## 2021-08-31 ENCOUNTER — Encounter: Payer: Medicare Other | Admitting: Physical Therapy

## 2021-08-31 DIAGNOSIS — R2689 Other abnormalities of gait and mobility: Secondary | ICD-10-CM

## 2021-08-31 DIAGNOSIS — R293 Abnormal posture: Secondary | ICD-10-CM

## 2021-08-31 DIAGNOSIS — M6281 Muscle weakness (generalized): Secondary | ICD-10-CM | POA: Diagnosis not present

## 2021-08-31 DIAGNOSIS — M25652 Stiffness of left hip, not elsewhere classified: Secondary | ICD-10-CM

## 2021-08-31 DIAGNOSIS — R2681 Unsteadiness on feet: Secondary | ICD-10-CM

## 2021-08-31 NOTE — Therapy (Signed)
OUTPATIENT PHYSICAL THERAPY PROSTHETIC TREATMENT NOTE   Patient Name: Samuel Carney MRN: [id number] DOB:05-Aug-1945, 76 y.o., male Today's Date: 08/31/2021  PCP: Juluis Pitch, MD REFERRING PROVIDER: Jamse Arn, MD   PT End of Session - 08/31/21 0934     Visit Number 11    Number of Visits 25    Date for PT Re-Evaluation 10/19/21    Authorization Type [employer] Medicare   $20 co-pay    Progress Note Due on Visit 10    PT Start Time 0930    PT Stop Time 6812    PT Time Calculation (min) 44 min    Equipment Utilized During Treatment Gait belt    Activity Tolerance Patient tolerated treatment well    Behavior During Therapy WFL for tasks assessed/performed                        Past Medical History:  Diagnosis Date   Alcohol abuse    Aortic atherosclerosis ([place])    B12 deficiency    Cirrhosis ([place])    COPD (chronic obstructive pulmonary disease) ([place])    Coronary artery disease    GERD (gastroesophageal reflux disease)    Glaucoma    Grade II diastolic dysfunction    History of kidney stones    HLD (hyperlipidemia)    Hx of radiation therapy    Hypertension    Lung mass    Moderate mitral regurgitation    Pneumonia    PVD (peripheral vascular disease) ([place])    Squamous cell carcinoma of lung, right ([place]) 2019   Past Surgical History:  Procedure Laterality Date   COLONOSCOPY WITH PROPOFOL     COLONOSCOPY WITH PROPOFOL N/A 11/25/2018   Procedure: COLONOSCOPY WITH PROPOFOL;  Surgeon: Lollie Sails, MD;  Location: [facility] ENDOSCOPY;  Service: Endoscopy;  Laterality: N/A;   ENDARTERECTOMY FEMORAL Left 07/13/2020   Procedure: ENDARTERECTOMY FEMORAL ( SFA STENT);  Surgeon: Katha Cabal, MD;  Location: [facility] ORS;  Service: Vascular;  Laterality: Left;   ESOPHAGOGASTRODUODENOSCOPY (EGD) WITH PROPOFOL N/A 11/25/2018   Procedure: ESOPHAGOGASTRODUODENOSCOPY (EGD) WITH PROPOFOL;  Surgeon: Lollie Sails, MD;  Location: [facility] ENDOSCOPY;  Service:  Endoscopy;  Laterality: N/A;   LEG AMPUTATION ABOVE KNEE Left    LOWER EXTREMITY ANGIOGRAPHY Left 06/07/2020   Procedure: LOWER EXTREMITY ANGIOGRAPHY;  Surgeon: Katha Cabal, MD;  Location: [place] [facility];  Service: Cardiovascular;  Laterality: Left;   LOWER EXTREMITY ANGIOGRAPHY Left 01/10/2021   Procedure: LOWER EXTREMITY ANGIOGRAPHY;  Surgeon: Katha Cabal, MD;  Location: [place] [facility];  Service: Cardiovascular;  Laterality: Left;   Patient Active Problem List   Diagnosis Date Noted   Atherosclerosis of artery of extremity with ulceration ([place]) 07/13/2020   Atherosclerosis of native arteries of the extremities with ulceration ([place]) 75/17/0017   Alcoholic cirrhosis of liver without ascites ([place]) 04/09/2019   Hyperlipidemia, mixed 04/09/2019   Malignant 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 ([place]) 04/09/2019   Thrombocytopenia ([place]) 04/09/2019   CAD (coronary artery disease) 10/14/2017   Lung cancer ([facility]) 10/14/2017   Nodule of upper lobe of right lung 09/15/2017   Tobacco abuse 08/14/2017   Benign essential hypertension 09/09/2013    REFERRING DIAG:  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    PAIN:  Are you having pain?  NPRS scale:    0/10 sitting & 0/10 when standing this morning but was 2/10 last night. Pain location: residual limb Pain description: ache, sore Aggravating factors: liner Relieving factors: removing liner.  OBJECTIVE:    MUSCLE LENGTH: Hamstrings: 07/24/2021:  Right -19 deg; with hip 90* Thomas test:  07/24/2021:  Right -19 deg; Left -27 deg   POSTURE: 07/24/2021:  rounded shoulders, forward head, flexed trunk , and weight shift right   LE ROM:   Active ROM Right 07/24/21 Left 07/24/21  Hip flexion      Hip extension Standing -20* Standing  -30*  Hip abduction      Hip adduction      Hip internal rotation      Hip external rotation      Knee flexion      Knee extension      Ankle dorsiflexion      Ankle plantarflexion      Ankle inversion      Ankle eversion       (Blank rows = not tested)   MMT:   MMT Right 07/24/21 Left 07/24/21  Hip flexion 5/5 4/4  Hip extension 3/5 3-/5  Hip abduction 3/5 3-/5  Hip adduction      Hip internal rotation      Hip external rotation      Knee flexion 3/5 NA  Knee extension 4/5 NA  Ankle dorsiflexion 5/5 NA  Ankle plantarflexion   NA  Ankle inversion   NA  Ankle eversion   NA  (Blank rows = not tested)   TRANSFERS: 07/24/2021:  Sit to stand: Modified independence and requires armrests & use of BUEs 07/24/2021:  Stand to sit: Modified independence and requires armrests & use of BUEs   GAIT: 07/24/2021:  Gait pattern: hopping with good clearance RLE Distance walked: 30' Assistive device utilized: Environmental consultant - 2 wheeled Level of assistance: Modified independence Gait velocity:  Comments:    FUNCTIONAL TESTs:  Merrilee Jansky Balance Scale:  07/31/2021  11/56  [facility] PT Assessment - 07/31/21 0815                Standardized Balance Assessment    Standardized Balance Assessment Berg Balance Test          Berg Balance Test    Sit to Stand Needs minimal aid to stand or to stabilize     Standing Unsupported Needs several tries to stand 30 seconds unsupported     Sitting with Back Unsupported but Feet Supported on Floor or Stool Able to sit safely and securely 2 minutes     Stand to Sit Uses backs of legs against chair to control descent     Transfers Able to transfer safely, definite need of hands     Standing Unsupported with Eyes Closed Needs  help to keep from falling     Standing Unsupported with Feet Together Needs help to attain position and unable to hold for 15 seconds     From Standing, Reach Forward with Outstretched Arm Loses balance while trying/requires external support     From Standing Position, Pick up Object from Floor Unable to try/needs assist to keep balance     From Standing Position, Turn to Look Behind Over each Shoulder  Needs assist to keep from losing balance and falling     Turn 360 Degrees Needs assistance while turning     Standing Unsupported, Alternately Place Feet on Step/Stool Needs assistance to keep from falling or unable to try     Standing Unsupported, One Foot in [employer] balance while stepping or standing     Standing on One Leg Unable to try or needs assist to prevent fall     Total Score 11     Berg comment: BERG  < 36 high risk for falls (close to 100%) 46-51 moderate (>50%)   37-45 significant (>80%) 52-55 lower (> 25%)        CARDIOVASCULAR RESPONSE: 07/24/2021:  Functional activity: gait hopping with RW Pre-activity vitals: HR: 54 SpO2: 99 Post-activity vitals: HR: 57 SpO2: 99 Modified Borg scale for dyspnea: 1: very mild shortness of breath   CURRENT PROSTHETIC WEAR ASSESSMENT: 07/24/2021:  Patient is dependent with: skin check, residual limb care, prosthetic cleaning, ply sock cleaning, correct ply sock adjustment, proper wear schedule/adjustment, and proper weight-bearing schedule/adjustment Prosthetic wear tolerance: wearing liner 3-4 hours 2x/day, 7 days/week Edema: nonpitting Residual limb condition: cylindrical shape, no open areas, invaginated scar posteriorly, normal temperature & color, slight dryness to skin, sensation intact. Prosthetic description: [facility] plans to deliver on Thursday, 4/27.  Prosthesis is silicon liner with velcro lanyard suspension, Single axis friction engaging knee with extension assist, flexible keel foot K code/activity level with prosthetic  PIR:JJOAC 2 basic community with fixed cadence   Today's Treatment: 08/31/2021 Prosthetic Training with Transfemoral Prosthesis: PT recommending to Continue wear all awake hours except 2-3 hours midday. Pt verbalized understanding.  Pt ambulated 30' X 2 with cane & HHA modA.  Inside //bars initially with BUEs on //bars then RUE cane / LUE bar:  PT demo & verbal cues on step length, full weight shift directly over prosthesis in stance prior to stepping RLE and upright posture (he responded to verbal cues "stand tall" best).  Carryover to RW. Turning 90* & 180* with weight on prosthesis in stance / not hopping. Not using urinal during day but walking to bathroom. Pt verbalized understanding.   Neuromuscular Re-ed: HEP for upright posture Standing with back to door frame reaching single & double UEs overhead hold 2 deep breathes 2 reps ea with target >5 times per day as only takes 1-2 minutes. Standing with back to counter with hands resting on counter for upright posture.  Goal is 5-10 minutes 2x/day. Hooklying supine dropping prosthesis over edge to stretch left hip flexors 30 sec hold 2-3 reps 2x/day. Pt verbalized understanding.  08/30/2021 Prosthetic Training with Transfemoral Prosthesis: PT educated on determining if suspension strap is loose & how to fully tighten. Pt return demo understanding.  Pt amb with RW 50' safely with general cues to improve posture and weight shift over prosthesis in stance and neg ramp min SBA & curb SBA. Cues for technique. PT continues to recommend beginning to walk in & out of community buildings with family following with w/c  to build endurance / activity tolerance. Pt & wife verbalized understanding.   Continue wear all awake hours except 2-3 hours midday. Pt verbalized understanding.  Pt ambulated along counter LUE & cane stand alone tip RUE 8' X 4 with cues on step length, wt shift over prosthesis & upright posture.  Pt ambulated 30' with cane & HHA modA.    Neuromuscular Re-ed: Sit to/from stand from chairs with armrests using UEs but not touching  RW for >/= 5 sec upon arising & prior to sitting.  Sit to/from stand using cane in LUE for balance with minA & cues. Pt ambulated backwards with counter & cane 8' X 4 with min guard.  RW support: worked on sidestepping & backwards gait and turning incorporating prosthesis with cues & SBA.  08/23/2021 Prosthetic Training with Transfemoral Prosthesis: Pt amb with RW 50' SBA and neg ramp min guard & curb SBA. Cues for technique. Pt exited amb with RW 120' with wife following with w/c. PT continues to recommend beginning to walk in & out of community buildings with family following with w/c  to build endurance / activity tolerance. Pt & wife verbalized understanding.   Continue wear all awake hours except 2 hours midday. Pt verbalized understanding.   Neuromuscular Re-ed: Standing performing UE green theraband resistance for balance: alternating UEs & BUEs 10 reps ea rows, forward reach and upward reach with minA / min guard.  Sit to/from stand from chairs with armrests using UEs but not touching RW for >/= 5 sec upon arising & prior to sitting.  In //bars with intermittent touch moving prosthesis forward back to bilateral stance, abduction back to bilateral stance and ext (only able to move back ~2") for 5 reps. Standing leaning forward placing Bil hands on chair bottom to upright without UE assist with PT minA / min guard 10 reps.      08/08/21 1106  PT Education  Education Details HEP at [employer]) Educated Patient;Child(ren)  Methods Explanation;Demonstration;Tactile cues;Verbal cues;Handout  Comprehension Returned demonstration;Tactile cues required;Verbalized understanding;Verbal cues required;Need further instruction     ASSESSMENT:   CLINICAL IMPRESSION: Patient appears to understand HEP to work on posture.  He has better understanding for tightening suspension strap.  Pt improved  prosthetic gait with work on technique with weight shift over prosthesis prior to stepping RLE.     OBJECTIVE IMPAIRMENTS Abnormal gait, cardiopulmonary status limiting activity, decreased activity tolerance, decreased balance, decreased endurance, decreased knowledge of use of DME, decreased mobility, decreased ROM, decreased strength, impaired flexibility, postural dysfunction, and prosthetic dependency .    ACTIVITY LIMITATIONS community activity, driving, and church.    PERSONAL FACTORS Age, Fitness, Time since onset of injury/illness/exacerbation, and 3+ comorbidities: see PMH  are also affecting patient's functional outcome.    REHAB POTENTIAL: Good   CLINICAL DECISION MAKING: Evolving/moderate complexity   EVALUATION COMPLEXITY: Moderate     GOALS: Goals reviewed with patient? Yes   SHORT TERM GOALS: Target date: 09/23/2021   Patient verbalizes proper adjustment of ply socks.  Goal status: ongoing 2.  Patient tolerates prosthesis >90% of awake hours daily without skin issues or limb pain <3/10 after standing. Goal status: ongoing   3.  Patient able to pick up items from floor without UE support with supervision. Goal status: ongoing   4. Patient ambulates 100' with cane & prosthesis with modA / HHA.  Goal status: ongoing    LONG TERM GOALS: Target date: 10/19/2021   Patient demonstrates & verbalized understanding of prosthetic care to enable safe utilization of prosthesis. Baseline: SEE OBJECTIVE DATA Goal status: INITIAL   Patient tolerates prosthesis wear >90% of awake hours without skin or limb pain issues. Baseline: SEE OBJECTIVE DATA Goal status: INITIAL   Berg Balance </= 36/56 to indicate lower fall risk Baseline: SEE OBJECTIVE DATA Goal status: INITIAL   Patient ambulates >300' with LRAD & prosthesis safely independently for community mobility. Baseline: SEE OBJECTIVE DATA Goal status: INITIAL   Patient  negotiates ramps, curbs & stairs with single rail  with LRAD & prosthesis independently for community mobility. Baseline: SEE OBJECTIVE DATA Goal status: INITIAL   PLAN: PT FREQUENCY: 2x/week   PT DURATION: 12 weeks   PLANNED INTERVENTIONS: Therapeutic exercises, Therapeutic activity, Neuromuscular re-education, Balance training, Gait training, Patient/Family education, Stair training, Vestibular training, Prosthetic training, DME instructions, and scar mobilization   PLAN FOR NEXT SESSION:  work towards updated STGs,  Continue to encourage progressive prosthetic gait with RW including ramp & curb, gait with cane near counter if tolerates weight bearing, balance activities.    Jamey Reas, PT, DPT 08/31/2021, 2:40 PM

## 2021-09-05 ENCOUNTER — Ambulatory Visit: Payer: Medicare Other | Admitting: Physical Therapy

## 2021-09-05 ENCOUNTER — Encounter: Payer: Medicare Other | Admitting: Physical Therapy

## 2021-09-05 ENCOUNTER — Encounter: Payer: Self-pay | Admitting: Physical Therapy

## 2021-09-05 DIAGNOSIS — R2689 Other abnormalities of gait and mobility: Secondary | ICD-10-CM

## 2021-09-05 DIAGNOSIS — M25652 Stiffness of left hip, not elsewhere classified: Secondary | ICD-10-CM | POA: Diagnosis not present

## 2021-09-05 DIAGNOSIS — M6281 Muscle weakness (generalized): Secondary | ICD-10-CM

## 2021-09-05 DIAGNOSIS — R293 Abnormal posture: Secondary | ICD-10-CM

## 2021-09-05 DIAGNOSIS — R2681 Unsteadiness on feet: Secondary | ICD-10-CM

## 2021-09-05 NOTE — Therapy (Signed)
OUTPATIENT PHYSICAL THERAPY PROSTHETIC TREATMENT NOTE   Patient Name: Samuel Carney MRN: [id number] DOB:07-30-1945, 76 y.o., male Today's Date: 09/05/2021  PCP: Juluis Pitch, MD REFERRING PROVIDER: Jamse Arn, MD   PT End of Session - 09/05/21 0933     Visit Number 12    Number of Visits 25    Date for PT Re-Evaluation 10/19/21    Authorization Type [employer] Medicare   $20 co-pay    Progress Note Due on Visit 20    PT Start Time 0930    PT Stop Time 2458    PT Time Calculation (min) 45 min    Equipment Utilized During Treatment Gait belt    Activity Tolerance Patient tolerated treatment well    Behavior During Therapy WFL for tasks assessed/performed                         Past Medical History:  Diagnosis Date   Alcohol abuse    Aortic atherosclerosis ([place])    B12 deficiency    Cirrhosis ([place])    COPD (chronic obstructive pulmonary disease) ([place])    Coronary artery disease    GERD (gastroesophageal reflux disease)    Glaucoma    Grade II diastolic dysfunction    History of kidney stones    HLD (hyperlipidemia)    Hx of radiation therapy    Hypertension    Lung mass    Moderate mitral regurgitation    Pneumonia    PVD (peripheral vascular disease) ([place])    Squamous cell carcinoma of lung, right ([place]) 2019   Past Surgical History:  Procedure Laterality Date   COLONOSCOPY WITH PROPOFOL     COLONOSCOPY WITH PROPOFOL N/A 11/25/2018   Procedure: COLONOSCOPY WITH PROPOFOL;  Surgeon: Lollie Sails, MD;  Location: [facility] ENDOSCOPY;  Service: Endoscopy;  Laterality: N/A;   ENDARTERECTOMY FEMORAL Left 07/13/2020   Procedure: ENDARTERECTOMY FEMORAL ( SFA STENT);  Surgeon: Katha Cabal, MD;  Location: [facility] ORS;  Service: Vascular;  Laterality: Left;   ESOPHAGOGASTRODUODENOSCOPY (EGD) WITH PROPOFOL N/A 11/25/2018   Procedure: ESOPHAGOGASTRODUODENOSCOPY (EGD) WITH PROPOFOL;  Surgeon: Lollie Sails, MD;  Location: [facility] ENDOSCOPY;  Service:  Endoscopy;  Laterality: N/A;   LEG AMPUTATION ABOVE KNEE Left    LOWER EXTREMITY ANGIOGRAPHY Left 06/07/2020   Procedure: LOWER EXTREMITY ANGIOGRAPHY;  Surgeon: Katha Cabal, MD;  Location: [place] CV LAB;  Service: Cardiovascular;  Laterality: Left;   LOWER EXTREMITY ANGIOGRAPHY Left 01/10/2021   Procedure: LOWER EXTREMITY ANGIOGRAPHY;  Surgeon: Katha Cabal, MD;  Location: [place] CV LAB;  Service: Cardiovascular;  Laterality: Left;   Patient Active Problem List   Diagnosis Date Noted   Atherosclerosis of artery of extremity with ulceration ([place]) 07/13/2020   Atherosclerosis of native arteries of the extremities with ulceration ([place]) 09/98/3382   Alcoholic cirrhosis of liver without ascites ([place]) 04/09/2019   Hyperlipidemia, mixed 04/09/2019   Malignant 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 ([place]) 04/09/2019   Thrombocytopenia ([place]) 04/09/2019   CAD (coronary artery disease) 10/14/2017   Lung cancer ([place]) 10/14/2017   Nodule of upper lobe of right lung 09/15/2017   Tobacco abuse 08/14/2017   Benign essential hypertension 09/09/2013    REFERRING DIAG:  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 He goes to bed ~8pm.  He has been walking with RW to neighbor's house for long walk, to/from car & more frequently in the home.    PAIN:  Are you having pain?  NPRS scale:    0/10 sitting & 0/10 when standing Pain location: residual limb Pain description: ache, sore Aggravating factors: liner Relieving factors: removing  liner.  OBJECTIVE:    MUSCLE LENGTH: Hamstrings: 07/24/2021:  Right -19 deg; with hip 90* Thomas test: 07/24/2021:  Right -19 deg; Left -27 deg   POSTURE: 07/24/2021:  rounded shoulders, forward head, flexed trunk , and weight shift right   LE ROM:   Active ROM Right 07/24/21 Left 07/24/21  Hip flexion      Hip extension Standing -20* Standing  -30*  Hip abduction      Hip adduction      Hip internal rotation      Hip external rotation      Knee flexion      Knee extension      Ankle dorsiflexion      Ankle plantarflexion      Ankle inversion      Ankle eversion       (Blank rows = not tested)   MMT:   MMT Right 07/24/21 Left 07/24/21  Hip flexion 5/5 4/4  Hip extension 3/5 3-/5  Hip abduction 3/5 3-/5  Hip adduction      Hip internal rotation      Hip external rotation      Knee flexion 3/5 NA  Knee extension 4/5 NA  Ankle dorsiflexion 5/5 NA  Ankle plantarflexion   NA  Ankle inversion   NA  Ankle eversion   NA  (Blank rows = not tested)   TRANSFERS: 07/24/2021:  Sit to stand: Modified independence and requires armrests & use of BUEs 07/24/2021:  Stand to sit: Modified independence and requires armrests & use of BUEs   GAIT: 07/24/2021:  Gait pattern: hopping with good clearance RLE Distance walked: 30' Assistive device utilized: Environmental consultant - 2 wheeled Level of assistance: Modified independence Gait velocity:  Comments:    FUNCTIONAL TESTs:  Merrilee Jansky Balance Scale:  07/31/2021  11/56  [facility] PT Assessment - 07/31/21 0815                Standardized Balance Assessment    Standardized Balance Assessment Berg Balance Test          Berg Balance Test    Sit to Stand Needs minimal aid to stand or to stabilize     Standing Unsupported Needs several tries to stand 30 seconds unsupported     Sitting with Back Unsupported but Feet Supported on Floor or Stool Able to sit safely and securely 2 minutes     Stand to Sit Uses backs of legs against chair to control descent      Transfers Able to transfer safely, definite need of hands     Standing Unsupported with Eyes Closed Needs help to keep from falling     Standing Unsupported with Feet Together Needs help to attain position and unable to hold for 15 seconds     From Standing, Reach Forward with Outstretched Arm Loses balance while trying/requires external support     From Standing Position, Pick up Object from Floor  Unable to try/needs assist to keep balance     From Standing Position, Turn to Look Behind Over each Shoulder Needs assist to keep from losing balance and falling     Turn 360 Degrees Needs assistance while turning     Standing Unsupported, Alternately Place Feet on Step/Stool Needs assistance to keep from falling or unable to try     Standing Unsupported, One Foot in [employer] balance while stepping or standing     Standing on One Leg Unable to try or needs assist to prevent fall     Total Score 11     Berg comment: BERG  < 36 high risk for falls (close to 100%) 46-51 moderate (>50%)   37-45 significant (>80%) 52-55 lower (> 25%)        CARDIOVASCULAR RESPONSE: 07/24/2021:  Functional activity: gait hopping with RW Pre-activity vitals: HR: 54 SpO2: 99 Post-activity vitals: HR: 57 SpO2: 99 Modified Borg scale for dyspnea: 1: very mild shortness of breath   CURRENT PROSTHETIC WEAR ASSESSMENT: 07/24/2021:  Patient is dependent with: skin check, residual limb care, prosthetic cleaning, ply sock cleaning, correct ply sock adjustment, proper wear schedule/adjustment, and proper weight-bearing schedule/adjustment Prosthetic wear tolerance: wearing liner 3-4 hours 2x/day, 7 days/week Edema: nonpitting Residual limb condition: cylindrical shape, no open areas, invaginated scar posteriorly, normal temperature & color, slight dryness to skin, sensation intact. Prosthetic description: [facility] plans to deliver on Thursday, 4/27.  Prosthesis is silicon liner with velcro lanyard suspension, Single  axis friction engaging knee with extension assist, flexible keel foot K code/activity level with prosthetic [id number] 2 basic community with fixed cadence   Today's Treatment: 09/05/2021 Prosthetic Training with Transfemoral Prosthesis: PT recommending to Continue wear all awake hours except 1-2 hours midday. PT explained rationale for wear most of awake hours including higher fall risk & stress to RLE / BUEs / back when prosthesis is off. Pt verbalized understanding.  Pt ambulated 80' X 2 with cane & HHA modA.  PT educated with demo & verbal cues on how to provide HHA. Dtr assisted 2nd ambulation with PT close supervision & intermittent contact assist.  Neuromuscular Re-ed: Side stepping & backwards gait with cane RUE & counter LUE. Turning 180* between sink & parallel chair back using cane RUE & touching support LUE for 5 reps CW & CCW.   08/31/2021 Prosthetic Training with Transfemoral Prosthesis: PT recommending to Continue wear all awake hours except 2-3 hours midday. Pt verbalized understanding.  Pt ambulated 30' X 2 with cane & HHA modA.  Inside //bars initially with BUEs on //bars then RUE cane / LUE bar:  PT demo & verbal cues on step length, full weight shift directly over prosthesis in stance prior to stepping RLE and upright posture (he responded to verbal cues "stand tall" best).  Carryover to RW. Turning 90* & 180* with weight on prosthesis in stance / not hopping. Not using urinal during day but walking to bathroom. Pt verbalized understanding.   Neuromuscular Re-ed: HEP for upright posture Standing with back to door frame reaching single & double UEs overhead hold 2 deep breathes 2 reps ea with target >5 times per day as only takes 1-2 minutes. Standing with back to counter with hands resting on counter for upright posture.  Goal is 5-10 minutes 2x/day. Hooklying supine dropping prosthesis over edge to stretch left hip flexors 30 sec hold 2-3 reps 2x/day. Pt verbalized  understanding.  08/30/2021 Prosthetic Training with Transfemoral Prosthesis: PT educated on determining if  suspension strap is loose & how to fully tighten. Pt return demo understanding.  Pt amb with RW 50' safely with general cues to improve posture and weight shift over prosthesis in stance and neg ramp min SBA & curb SBA. Cues for technique. PT continues to recommend beginning to walk in & out of community buildings with family following with w/c  to build endurance / activity tolerance. Pt & wife verbalized understanding.   Continue wear all awake hours except 2-3 hours midday. Pt verbalized understanding.  Pt ambulated along counter LUE & cane stand alone tip RUE 8' X 4 with cues on step length, wt shift over prosthesis & upright posture.  Pt ambulated 30' with cane & HHA modA.   Neuromuscular Re-ed: Sit to/from stand from chairs with armrests using UEs but not touching RW for >/= 5 sec upon arising & prior to sitting.  Sit to/from stand using cane in LUE for balance with minA & cues. Pt ambulated backwards with counter & cane 8' X 4 with min guard.  RW support: worked on sidestepping & backwards gait and turning incorporating prosthesis with cues & SBA.    08/08/21 1106  PT Education  Education Details HEP at sink  Person(s) Educated Patient;Child(ren)  Methods Explanation;Demonstration;Tactile cues;Verbal cues;Handout  Comprehension Returned demonstration;Tactile cues required;Verbalized understanding;Verbal cues required;Need further instruction     ASSESSMENT:   CLINICAL IMPRESSION: Patient reports increased activity with prosthesis & RW during 7-8 hours of wear but continues to limit wear time.  PT introduced hand hold assist to dtr but needs further training prior to attempting outside of PT.     OBJECTIVE IMPAIRMENTS Abnormal gait, cardiopulmonary status limiting activity, decreased activity tolerance, decreased balance, decreased endurance, decreased knowledge of use of  DME, decreased mobility, decreased ROM, decreased strength, impaired flexibility, postural dysfunction, and prosthetic dependency .    ACTIVITY LIMITATIONS community activity, driving, and church.    PERSONAL FACTORS Age, Fitness, Time since onset of injury/illness/exacerbation, and 3+ comorbidities: see PMH  are also affecting patient's functional outcome.    REHAB POTENTIAL: Good   CLINICAL DECISION MAKING: Evolving/moderate complexity   EVALUATION COMPLEXITY: Moderate     GOALS: Goals reviewed with patient? Yes   SHORT TERM GOALS: Target date: 09/23/2021   Patient verbalizes proper adjustment of ply socks.  Goal status: ongoing 2.  Patient tolerates prosthesis >90% of awake hours daily without skin issues or limb pain <3/10 after standing. Goal status: ongoing   3.  Patient able to pick up items from floor without UE support with supervision. Goal status: ongoing   4. Patient ambulates 100' with cane & prosthesis with modA / HHA.  Goal status: ongoing    LONG TERM GOALS: Target date: 10/19/2021   Patient demonstrates & verbalized understanding of prosthetic care to enable safe utilization of prosthesis. Baseline: SEE OBJECTIVE DATA Goal status: INITIAL   Patient tolerates prosthesis wear >90% of awake hours without skin or limb pain issues. Baseline: SEE OBJECTIVE DATA Goal status: INITIAL   Berg Balance </= 36/56 to indicate lower fall risk Baseline: SEE OBJECTIVE DATA Goal status: INITIAL   Patient ambulates >300' with LRAD & prosthesis safely independently for community mobility. Baseline: SEE OBJECTIVE DATA Goal status: INITIAL   Patient negotiates ramps, curbs & stairs with single rail with LRAD & prosthesis independently for community mobility. Baseline: SEE OBJECTIVE DATA Goal status: INITIAL   PLAN: PT FREQUENCY: 2x/week   PT DURATION: 12 weeks   PLANNED INTERVENTIONS: Therapeutic exercises, Therapeutic activity, Neuromuscular re-education,  Balance  training, Gait training, Patient/Family education, Stair training, Vestibular training, Prosthetic training, DME instructions, and scar mobilization   PLAN FOR NEXT SESSION:  continue work towards updated STGs, prosthetic gait with cane & instruct family in assisting,  balance activities.    Jamey Reas, PT, DPT 09/05/2021, 11:59 AM

## 2021-09-07 ENCOUNTER — Encounter: Payer: Medicare Other | Admitting: Physical Therapy

## 2021-09-07 ENCOUNTER — Encounter: Payer: Self-pay | Admitting: Physical Therapy

## 2021-09-07 ENCOUNTER — Ambulatory Visit (INDEPENDENT_AMBULATORY_CARE_PROVIDER_SITE_OTHER): Payer: Medicare Other | Admitting: Physical Therapy

## 2021-09-07 DIAGNOSIS — R2689 Other abnormalities of gait and mobility: Secondary | ICD-10-CM

## 2021-09-07 DIAGNOSIS — R2681 Unsteadiness on feet: Secondary | ICD-10-CM

## 2021-09-07 DIAGNOSIS — M6281 Muscle weakness (generalized): Secondary | ICD-10-CM

## 2021-09-07 DIAGNOSIS — R293 Abnormal posture: Secondary | ICD-10-CM

## 2021-09-07 DIAGNOSIS — M25652 Stiffness of left hip, not elsewhere classified: Secondary | ICD-10-CM

## 2021-09-07 NOTE — Therapy (Signed)
OUTPATIENT PHYSICAL THERAPY PROSTHETIC TREATMENT NOTE   Patient Name: Samuel Carney MRN: [id number] DOB:Mar 27, 1946, 76 y.o., male Today's Date: 09/07/2021  PCP: Juluis Pitch, MD REFERRING PROVIDER: Jamse Arn, MD   PT End of Session - 09/07/21 0935     Visit Number 13    Number of Visits 25    Date for PT Re-Evaluation 10/19/21    Authorization Type [employer] Medicare   $20 co-pay    Progress Note Due on Visit 56    PT Start Time 0935    PT Stop Time 2778    PT Time Calculation (min) 40 min    Equipment Utilized During Treatment Gait belt    Activity Tolerance Patient tolerated treatment well    Behavior During Therapy WFL for tasks assessed/performed                          Past Medical History:  Diagnosis Date   Alcohol abuse    Aortic atherosclerosis ([place])    B12 deficiency    Cirrhosis ([place])    COPD (chronic obstructive pulmonary disease) ([place])    Coronary artery disease    GERD (gastroesophageal reflux disease)    Glaucoma    Grade II diastolic dysfunction    History of kidney stones    HLD (hyperlipidemia)    Hx of radiation therapy    Hypertension    Lung mass    Moderate mitral regurgitation    Pneumonia    PVD (peripheral vascular disease) ([place])    Squamous cell carcinoma of lung, right ([place]) 2019   Past Surgical History:  Procedure Laterality Date   COLONOSCOPY WITH PROPOFOL     COLONOSCOPY WITH PROPOFOL N/A 11/25/2018   Procedure: COLONOSCOPY WITH PROPOFOL;  Surgeon: Lollie Sails, MD;  Location: [facility] ENDOSCOPY;  Service: Endoscopy;  Laterality: N/A;   ENDARTERECTOMY FEMORAL Left 07/13/2020   Procedure: ENDARTERECTOMY FEMORAL ( SFA STENT);  Surgeon: Katha Cabal, MD;  Location: [facility] ORS;  Service: Vascular;  Laterality: Left;   ESOPHAGOGASTRODUODENOSCOPY (EGD) WITH PROPOFOL N/A 11/25/2018   Procedure: ESOPHAGOGASTRODUODENOSCOPY (EGD) WITH PROPOFOL;  Surgeon: Lollie Sails, MD;  Location: [facility] ENDOSCOPY;  Service:  Endoscopy;  Laterality: N/A;   LEG AMPUTATION ABOVE KNEE Left    LOWER EXTREMITY ANGIOGRAPHY Left 06/07/2020   Procedure: LOWER EXTREMITY ANGIOGRAPHY;  Surgeon: Katha Cabal, MD;  Location: [place] CV LAB;  Service: Cardiovascular;  Laterality: Left;   LOWER EXTREMITY ANGIOGRAPHY Left 01/10/2021   Procedure: LOWER EXTREMITY ANGIOGRAPHY;  Surgeon: Katha Cabal, MD;  Location: [place] [facility];  Service: Cardiovascular;  Laterality: Left;   Patient Active Problem List   Diagnosis Date Noted   Atherosclerosis of artery of extremity with ulceration ([place]) 07/13/2020   Atherosclerosis of native arteries of the extremities with ulceration ([place]) 24/23/5361   Alcoholic cirrhosis of liver without ascites ([place]) 04/09/2019   Hyperlipidemia, mixed 04/09/2019   Malignant 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 ([place]) 04/09/2019   Thrombocytopenia ([place]) 04/09/2019   CAD (coronary artery disease) 10/14/2017   Lung cancer ([place]) 10/14/2017   Nodule of upper lobe of right lung 09/15/2017   Tobacco abuse 08/14/2017   Benign essential hypertension 09/09/2013    REFERRING DIAG:  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  He has been diligent about making sure prosthetic rotator is locked in place.   PAIN:  Are you having pain?  NPRS scale:   1-2/10 when standing Pain location: residual limb Pain description: ache, sore Aggravating factors: liner Relieving factors: removing liner.  Right hip 0/10   OBJECTIVE:    MUSCLE LENGTH: Hamstrings: 07/24/2021:  Right  -19 deg; with hip 90* Thomas test: 07/24/2021:  Right -19 deg; Left -27 deg   POSTURE: 07/24/2021:  rounded shoulders, forward head, flexed trunk , and weight shift right   LE ROM:   Active ROM Right 07/24/21 Left 07/24/21  Hip flexion      Hip extension Standing -20* Standing  -30*  Hip abduction      Hip adduction      Hip internal rotation      Hip external rotation      Knee flexion      Knee extension      Ankle dorsiflexion      Ankle plantarflexion      Ankle inversion      Ankle eversion       (Blank rows = not tested)   MMT:   MMT Right 07/24/21 Left 07/24/21  Hip flexion 5/5 4/4  Hip extension 3/5 3-/5  Hip abduction 3/5 3-/5  Hip adduction      Hip internal rotation      Hip external rotation      Knee flexion 3/5 NA  Knee extension 4/5 NA  Ankle dorsiflexion 5/5 NA  Ankle plantarflexion   NA  Ankle inversion   NA  Ankle eversion   NA  (Blank rows = not tested)   TRANSFERS: 07/24/2021:  Sit to stand: Modified independence and requires armrests & use of BUEs 07/24/2021:  Stand to sit: Modified independence and requires armrests & use of BUEs   GAIT: 07/24/2021:  Gait pattern: hopping with good clearance RLE Distance walked: 30' Assistive device utilized: Environmental consultant - 2 wheeled Level of assistance: Modified independence Gait velocity:  Comments:    FUNCTIONAL TESTs:  Merrilee Jansky Balance Scale:  07/31/2021  11/56  [facility] PT Assessment - 07/31/21 0815                Standardized Balance Assessment    Standardized Balance Assessment Berg Balance Test          Berg Balance Test    Sit to Stand Needs minimal aid to stand or to stabilize     Standing Unsupported Needs several tries to stand 30 seconds unsupported     Sitting with Back Unsupported but Feet Supported on Floor or Stool Able to sit safely and securely 2 minutes     Stand to Sit Uses backs of legs against chair to control descent     Transfers Able to transfer safely, definite need of hands     Standing  Unsupported with Eyes Closed Needs help to keep from falling     Standing Unsupported with Feet Together Needs help to attain position and unable to hold for 15 seconds     From Standing, Reach Forward with Outstretched Arm Loses balance while trying/requires external support     From Standing Position, Pick up Object from Floor Unable to try/needs assist to keep balance  From Standing Position, Turn to Look Behind Over each Shoulder Needs assist to keep from losing balance and falling     Turn 360 Degrees Needs assistance while turning     Standing Unsupported, Alternately Place Feet on Step/Stool Needs assistance to keep from falling or unable to try     Standing Unsupported, One Foot in [employer] balance while stepping or standing     Standing on One Leg Unable to try or needs assist to prevent fall     Total Score 11     Berg comment: BERG  < 36 high risk for falls (close to 100%) 46-51 moderate (>50%)   37-45 significant (>80%) 52-55 lower (> 25%)        CARDIOVASCULAR RESPONSE: 07/24/2021:  Functional activity: gait hopping with RW Pre-activity vitals: HR: 54 SpO2: 99 Post-activity vitals: HR: 57 SpO2: 99 Modified Borg scale for dyspnea: 1: very mild shortness of breath   CURRENT PROSTHETIC WEAR ASSESSMENT: 07/24/2021:  Patient is dependent with: skin check, residual limb care, prosthetic cleaning, ply sock cleaning, correct ply sock adjustment, proper wear schedule/adjustment, and proper weight-bearing schedule/adjustment Prosthetic wear tolerance: wearing liner 3-4 hours 2x/day, 7 days/week Edema: nonpitting Residual limb condition: cylindrical shape, no open areas, invaginated scar posteriorly, normal temperature & color, slight dryness to skin, sensation intact. Prosthetic description: [facility] plans to deliver on Thursday, 4/27.  Prosthesis is silicon liner with velcro lanyard suspension, Single axis friction engaging knee with extension assist, flexible keel foot K  code/activity level with prosthetic JJH:ERDEY 2 basic community with fixed cadence   Today's Treatment: 09/07/2021 Prosthetic Training with Transfemoral Prosthesis: PT review recommendation to wear all awake hours except 1-2 hours midday. Pt verbalized understanding.  Pt ambulated 80' X 2 with cane & HHA modA.  PT educated with demo & verbal cues on how to provide HHA. Wife assisted 2nd ambulation with PT close supervision & intermittent contact assist. PT recommended wearing shorter 1/4" lift in right shoe for 4 hours each morning then switch back to 3/8" lift.  He should continue this until he can not feel a difference.  PT assessed height in standing with 1/4" lift in right shoe and his right hip continues to be higher indicating difference in stance.  PT verbal & demo cues while pt performing neg stairs with single rail like his daughter's home.  Pt amb with RW to edge of stairs (PT instructed in need to move RW to top or bottom prior to neg). Pt neg 5 steps 7" ht with left rail / right cane for 3 steps & right rail / left cane for 2 steps. He required min guard assist. PT instructed in proper technique to assist  / guard for safety.    09/05/2021 Prosthetic Training with Transfemoral Prosthesis: PT recommending to Continue wear all awake hours except 1-2 hours midday. PT explained rationale for wear most of awake hours including higher fall risk & stress to RLE / BUEs / back when prosthesis is off. Pt verbalized understanding.  Pt ambulated 80' X 2 with cane & HHA modA.  PT educated with demo & verbal cues on how to provide HHA. Dtr assisted 2nd ambulation with PT close supervision & intermittent contact assist.  Neuromuscular Re-ed: Side stepping & backwards gait with cane RUE & counter LUE. Turning 180* between sink & parallel chair back using cane RUE & touching support LUE for 5 reps CW & CCW.   08/31/2021 Prosthetic Training with Transfemoral Prosthesis: PT recommending to Continue  wear all  awake hours except 2-3 hours midday. Pt verbalized understanding.  Pt ambulated 30' X 2 with cane & HHA modA.  Inside //bars initially with BUEs on //bars then RUE cane / LUE bar:  PT demo & verbal cues on step length, full weight shift directly over prosthesis in stance prior to stepping RLE and upright posture (he responded to verbal cues "stand tall" best).  Carryover to RW. Turning 90* & 180* with weight on prosthesis in stance / not hopping. Not using urinal during day but walking to bathroom. Pt verbalized understanding.   Neuromuscular Re-ed: HEP for upright posture Standing with back to door frame reaching single & double UEs overhead hold 2 deep breathes 2 reps ea with target >5 times per day as only takes 1-2 minutes. Standing with back to counter with hands resting on counter for upright posture.  Goal is 5-10 minutes 2x/day. Hooklying supine dropping prosthesis over edge to stretch left hip flexors 30 sec hold 2-3 reps 2x/day. Pt verbalized understanding.    08/08/21 1106  PT Education  Education Details HEP at [employer]) Educated Patient;Child(ren)  Methods Explanation;Demonstration;Tactile cues;Verbal cues;Handout  Comprehension Returned demonstration;Tactile cues required;Verbalized understanding;Verbal cues required;Need further instruction     ASSESSMENT:   CLINICAL IMPRESSION: Patient has muscular pain with 1/8" difference in right shoe heel lift.  He may need longer period to accommodate.  Assessment of pelvis indicates that lift in right shoe makes RLE longer than LLE which would probably lead to back & RLE pain with prolonged function.  Patient's wife appears safe to assist short distances with cane.  He appears to understand principle to negotiate stairs with single rail with family.   OBJECTIVE IMPAIRMENTS Abnormal gait, cardiopulmonary status limiting activity, decreased activity tolerance, decreased balance, decreased endurance, decreased knowledge of use of  DME, decreased mobility, decreased ROM, decreased strength, impaired flexibility, postural dysfunction, and prosthetic dependency .    ACTIVITY LIMITATIONS community activity, driving, and church.    PERSONAL FACTORS Age, Fitness, Time since onset of injury/illness/exacerbation, and 3+ comorbidities: see PMH  are also affecting patient's functional outcome.    REHAB POTENTIAL: Good   CLINICAL DECISION MAKING: Evolving/moderate complexity   EVALUATION COMPLEXITY: Moderate     GOALS: Goals reviewed with patient? Yes   SHORT TERM GOALS: Target date: 09/23/2021   Patient verbalizes proper adjustment of ply socks.  Goal status: ongoing 2.  Patient tolerates prosthesis >90% of awake hours daily without skin issues or limb pain <3/10 after standing. Goal status: ongoing   3.  Patient able to pick up items from floor without UE support with supervision. Goal status: ongoing   4. Patient ambulates 100' with cane & prosthesis with modA / HHA.  Goal status: ongoing    LONG TERM GOALS: Target date: 10/19/2021   Patient demonstrates & verbalized understanding of prosthetic care to enable safe utilization of prosthesis. Baseline: SEE OBJECTIVE DATA Goal status: INITIAL   Patient tolerates prosthesis wear >90% of awake hours without skin or limb pain issues. Baseline: SEE OBJECTIVE DATA Goal status: INITIAL   Berg Balance </= 36/56 to indicate lower fall risk Baseline: SEE OBJECTIVE DATA Goal status: INITIAL   Patient ambulates >300' with LRAD & prosthesis safely independently for community mobility. Baseline: SEE OBJECTIVE DATA Goal status: INITIAL   Patient negotiates ramps, curbs & stairs with single rail with LRAD & prosthesis independently for community mobility. Baseline: SEE OBJECTIVE DATA Goal status: INITIAL   PLAN: PT FREQUENCY: 2x/week   PT  DURATION: 12 weeks   PLANNED INTERVENTIONS: Therapeutic exercises, Therapeutic activity, Neuromuscular re-education, Balance  training, Gait training, Patient/Family education, Stair training, Vestibular training, Prosthetic training, DME instructions, and scar mobilization   PLAN FOR NEXT SESSION:  work towards updated STGs, prosthetic gait with cane,  balance activities.    Jamey Reas, PT, DPT 09/07/2021, 2:26 PM

## 2021-09-12 ENCOUNTER — Other Ambulatory Visit: Payer: Self-pay | Admitting: Cardiovascular Disease

## 2021-09-12 ENCOUNTER — Encounter: Payer: Self-pay | Admitting: Physical Therapy

## 2021-09-12 ENCOUNTER — Ambulatory Visit: Payer: Medicare Other | Admitting: Physical Therapy

## 2021-09-12 DIAGNOSIS — R293 Abnormal posture: Secondary | ICD-10-CM

## 2021-09-12 DIAGNOSIS — M25652 Stiffness of left hip, not elsewhere classified: Secondary | ICD-10-CM

## 2021-09-12 DIAGNOSIS — R2681 Unsteadiness on feet: Secondary | ICD-10-CM | POA: Diagnosis not present

## 2021-09-12 DIAGNOSIS — M6281 Muscle weakness (generalized): Secondary | ICD-10-CM | POA: Diagnosis not present

## 2021-09-12 DIAGNOSIS — R2689 Other abnormalities of gait and mobility: Secondary | ICD-10-CM | POA: Diagnosis not present

## 2021-09-12 NOTE — Therapy (Signed)
OUTPATIENT PHYSICAL THERAPY PROSTHETIC TREATMENT NOTE   Patient Name: Samuel Carney MRN: [id number] DOB:[birth year], 76 y.o., male Today's Date: 09/12/2021  PCP: Juluis Pitch, MD REFERRING PROVIDER: Jamse Arn, MD   PT End of Session - 09/12/21 0804     Visit Number 14    Number of Visits 25    Date for PT Re-Evaluation 10/19/21    Authorization Type [employer] Medicare   $20 co-pay    Progress Note Due on Visit 32    PT Start Time 0800    PT Stop Time 0845    PT Time Calculation (min) 45 min    Equipment Utilized During Treatment Gait belt    Activity Tolerance Patient tolerated treatment well    Behavior During Therapy WFL for tasks assessed/performed                           Past Medical History:  Diagnosis Date   Alcohol abuse    Aortic atherosclerosis ([place])    B12 deficiency    Cirrhosis ([place])    COPD (chronic obstructive pulmonary disease) ([place])    Coronary artery disease    GERD (gastroesophageal reflux disease)    Glaucoma    Grade II diastolic dysfunction    History of kidney stones    HLD (hyperlipidemia)    Hx of radiation therapy    Hypertension    Lung mass    Moderate mitral regurgitation    Pneumonia    PVD (peripheral vascular disease) ([place])    Squamous cell carcinoma of lung, right ([place]) 2019   Past Surgical History:  Procedure Laterality Date   COLONOSCOPY WITH PROPOFOL     COLONOSCOPY WITH PROPOFOL N/A 11/25/2018   Procedure: COLONOSCOPY WITH PROPOFOL;  Surgeon: Lollie Sails, MD;  Location: [facility] ENDOSCOPY;  Service: Endoscopy;  Laterality: N/A;   ENDARTERECTOMY FEMORAL Left 07/13/2020   Procedure: ENDARTERECTOMY FEMORAL ( SFA STENT);  Surgeon: Katha Cabal, MD;  Location: [facility] ORS;  Service: Vascular;  Laterality: Left;   ESOPHAGOGASTRODUODENOSCOPY (EGD) WITH PROPOFOL N/A 11/25/2018   Procedure: ESOPHAGOGASTRODUODENOSCOPY (EGD) WITH PROPOFOL;  Surgeon: Lollie Sails, MD;  Location: [facility] ENDOSCOPY;   Service: Endoscopy;  Laterality: N/A;   LEG AMPUTATION ABOVE KNEE Left    LOWER EXTREMITY ANGIOGRAPHY Left 06/07/2020   Procedure: LOWER EXTREMITY ANGIOGRAPHY;  Surgeon: Katha Cabal, MD;  Location: [place] CV LAB;  Service: Cardiovascular;  Laterality: Left;   LOWER EXTREMITY ANGIOGRAPHY Left 01/10/2021   Procedure: LOWER EXTREMITY ANGIOGRAPHY;  Surgeon: Katha Cabal, MD;  Location: [place] CV LAB;  Service: Cardiovascular;  Laterality: Left;   Patient Active Problem List   Diagnosis Date Noted   Atherosclerosis of artery of extremity with ulceration ([place]) 07/13/2020   Atherosclerosis of native arteries of the extremities with ulceration ([place]) 01/77/9390   Alcoholic cirrhosis of liver without ascites ([place]) 04/09/2019   Hyperlipidemia, mixed 04/09/2019   Malignant 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 ([place]) 04/09/2019   Thrombocytopenia (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 "I'd rather have the short one." (PT recommended using just short one now)  PAIN:  Are you having pain?  NPRS scale: 1-2/10 when standing Pain location: residual limb Pain description: ache, sore Aggravating factors: liner Relieving factors: removing liner.  Right hip 0/10   OBJECTIVE:    MUSCLE LENGTH: Hamstrings: 07/24/2021:  Right -19  deg; with hip 90* Thomas test: 07/24/2021:  Right -19 deg; Left -27 deg   POSTURE: 07/24/2021:  rounded shoulders, forward head, flexed trunk , and weight shift right   LE ROM:   Active ROM Right 07/24/21 Left 07/24/21  Hip flexion      Hip extension Standing -20* Standing  -30*  Hip abduction      Hip adduction      Hip internal rotation      Hip external rotation      Knee flexion      Knee extension      Ankle dorsiflexion      Ankle plantarflexion      Ankle inversion      Ankle eversion       (Blank rows = not tested)   MMT:   MMT Right 07/24/21 Left 07/24/21  Hip flexion 5/5 4/4  Hip extension 3/5 3-/5  Hip abduction 3/5 3-/5  Hip adduction      Hip internal rotation      Hip external rotation      Knee flexion 3/5 NA  Knee extension 4/5 NA  Ankle dorsiflexion 5/5 NA  Ankle plantarflexion   NA  Ankle inversion   NA  Ankle eversion   NA  (Blank rows = not tested)   TRANSFERS: 07/24/2021:  Sit to stand: Modified independence and requires armrests & use of BUEs 07/24/2021:  Stand to sit: Modified independence and requires armrests & use of BUEs   GAIT: 07/24/2021:  Gait pattern: hopping with good clearance RLE Distance walked: 30' Assistive device utilized: Environmental consultant - 2 wheeled Level of assistance: Modified independence Gait velocity:  Comments:    FUNCTIONAL TESTs:  Merrilee Jansky Balance Scale:  07/31/2021  11/56  [facility] PT Assessment - 07/31/21 0815                Standardized Balance Assessment    Standardized Balance Assessment Berg Balance Test          Berg Balance Test    Sit to Stand Needs minimal aid to stand or to stabilize     Standing Unsupported Needs several tries to stand 30 seconds unsupported     Sitting with Back Unsupported but Feet Supported on Floor or Stool Able to sit safely and securely 2 minutes     Stand to Sit Uses backs of legs against chair to control descent     Transfers Able to transfer safely, definite need of hands     Standing  Unsupported with Eyes Closed Needs help to keep from falling     Standing Unsupported with Feet Together Needs help to attain position and unable to hold for 15 seconds     From Standing, Reach Forward with Outstretched Arm Loses balance while trying/requires external support     From Standing Position, Pick up Object from Floor Unable to try/needs assist to keep balance     From Standing Position, Turn to  Look Behind Over each Shoulder Needs assist to keep from losing balance and falling     Turn 360 Degrees Needs assistance while turning     Standing Unsupported, Alternately Place Feet on Step/Stool Needs assistance to keep from falling or unable to try     Standing Unsupported, One Foot in [employer] balance while stepping or standing     Standing on One Leg Unable to try or needs assist to prevent fall     Total Score 11     Berg comment: BERG  < 36 high risk for falls (close to 100%) 46-51 moderate (>50%)   37-45 significant (>80%) 52-55 lower (> 25%)        CARDIOVASCULAR RESPONSE: 07/24/2021:  Functional activity: gait hopping with RW Pre-activity vitals: HR: 54 SpO2: 99 Post-activity vitals: HR: 57 SpO2: 99 Modified Borg scale for dyspnea: 1: very mild shortness of breath   CURRENT PROSTHETIC WEAR ASSESSMENT: 07/24/2021:  Patient is dependent with: skin check, residual limb care, prosthetic cleaning, ply sock cleaning, correct ply sock adjustment, proper wear schedule/adjustment, and proper weight-bearing schedule/adjustment Prosthetic wear tolerance: wearing liner 3-4 hours 2x/day, 7 days/week Edema: nonpitting Residual limb condition: cylindrical shape, no open areas, invaginated scar posteriorly, normal temperature & color, slight dryness to skin, sensation intact. Prosthetic description: [facility] plans to deliver on Thursday, 4/27.  Prosthesis is silicon liner with velcro lanyard suspension, Single axis friction engaging knee with extension assist, flexible keel foot K  code/activity level with prosthetic KGY:JEHUD 2 basic community with fixed cadence   Today's Treatment: 09/12/2021 Prosthetic Training with Transfemoral Prosthesis: PT review recommendation to wear all awake hours except 1-2 hours midday. Pt verbalized understanding.  Pt ambulated 100' X 2 with cane & HHA modA.   PT demo & verbal cues on stairs with rail on left side if ascending & on right side descending by side stepping with BUEs on rail. Pt return demo 3 steps with minA & verbal cues. Pt neg with right rail ascend & left rail descend and cane in opposite hand with minA & verbal cues.   Neuromuscular Re-ed: Standing bilateral stance with feet 2-3" apart head turns with EO on floor, EC on floor & EO on foam with minA /tactile cues and intermittent touch on bars. Stepping prosthesis forward, lateral & backward and stabilizing for 5 sec then return to bilateral stance. Pt performed with minA & intermittent touch on bars.   09/07/2021 Prosthetic Training with Transfemoral Prosthesis: PT review recommendation to wear all awake hours except 1-2 hours midday. Pt verbalized understanding.  Pt ambulated 80' X 2 with cane & HHA modA.  PT educated with demo & verbal cues on how to provide HHA. Wife assisted 2nd ambulation with PT close supervision & intermittent contact assist. PT recommended wearing shorter 1/4" lift in right shoe for 4 hours each morning then switch back to 3/8" lift.  He should continue this until he can not feel a difference.  PT assessed height in standing with 1/4" lift in right shoe and his right hip continues to be higher indicating difference in stance.  PT verbal & demo cues while pt performing neg stairs with single rail like his daughter's home.  Pt amb with RW to edge of stairs (PT instructed in need to move RW to top or bottom prior to neg). Pt neg 5 steps 7" ht with left rail / right cane for 3 steps & right rail / left cane for 2 steps. He required min guard  assist. PT  instructed in proper technique to assist  / guard for safety.    09/05/2021 Prosthetic Training with Transfemoral Prosthesis: PT recommending to Continue wear all awake hours except 1-2 hours midday. PT explained rationale for wear most of awake hours including higher fall risk & stress to RLE / BUEs / back when prosthesis is off. Pt verbalized understanding.  Pt ambulated 80' X 2 with cane & HHA modA.  PT educated with demo & verbal cues on how to provide HHA. Dtr assisted 2nd ambulation with PT close supervision & intermittent contact assist.  Neuromuscular Re-ed: Side stepping & backwards gait with cane RUE & counter LUE. Turning 180* between sink & parallel chair back using cane RUE & touching support LUE for 5 reps CW & CCW.        08/08/21 1106  PT Education  Education Details HEP at sink  Person(s) Educated Patient;Child(ren)  Methods Explanation;Demonstration;Tactile cues;Verbal cues;Handout  Comprehension Returned demonstration;Tactile cues required;Verbalized understanding;Verbal cues required;Need further instruction   HEP for upright posture Standing with back to door frame reaching single & double UEs overhead hold 2 deep breathes 2 reps ea with target >5 times per day as only takes 1-2 minutes. Standing with back to counter with hands resting on counter for upright posture.  Goal is 5-10 minutes 2x/day. Hooklying supine dropping prosthesis over edge to stretch left hip flexors 30 sec hold 2-3 reps 2x/day.   ASSESSMENT:   CLINICAL IMPRESSION: Patient & wife appear to have better understanding of neg stairs.  He improved balance reactions for skilled PT interventions.    OBJECTIVE IMPAIRMENTS Abnormal gait, cardiopulmonary status limiting activity, decreased activity tolerance, decreased balance, decreased endurance, decreased knowledge of use of DME, decreased mobility, decreased ROM, decreased strength, impaired flexibility, postural dysfunction, and prosthetic dependency  .    ACTIVITY LIMITATIONS community activity, driving, and church.    PERSONAL FACTORS Age, Fitness, Time since onset of injury/illness/exacerbation, and 3+ comorbidities: see PMH  are also affecting patient's functional outcome.    REHAB POTENTIAL: Good   CLINICAL DECISION MAKING: Evolving/moderate complexity   EVALUATION COMPLEXITY: Moderate     GOALS: Goals reviewed with patient? Yes   SHORT TERM GOALS: Target date: 09/23/2021   Patient verbalizes proper adjustment of ply socks.  Goal status: ongoing 2.  Patient tolerates prosthesis >90% of awake hours daily without skin issues or limb pain <3/10 after standing. Goal status: ongoing   3.  Patient able to pick up items from floor without UE support with supervision. Goal status: ongoing   4. Patient ambulates 100' with cane & prosthesis with modA / HHA.  Goal status: ongoing    LONG TERM GOALS: Target date: 10/19/2021   Patient demonstrates & verbalized understanding of prosthetic care to enable safe utilization of prosthesis. Baseline: SEE OBJECTIVE DATA Goal status: INITIAL   Patient tolerates prosthesis wear >90% of awake hours without skin or limb pain issues. Baseline: SEE OBJECTIVE DATA Goal status: INITIAL   Berg Balance </= 36/56 to indicate lower fall risk Baseline: SEE OBJECTIVE DATA Goal status: INITIAL   Patient ambulates >300' with LRAD & prosthesis safely independently for community mobility. Baseline: SEE OBJECTIVE DATA Goal status: INITIAL   Patient negotiates ramps, curbs & stairs with single rail with LRAD & prosthesis independently for community mobility. Baseline: SEE OBJECTIVE DATA Goal status: INITIAL   PLAN: PT FREQUENCY: 2x/week   PT DURATION: 12 weeks   PLANNED INTERVENTIONS: Therapeutic exercises, Therapeutic activity, Neuromuscular re-education, Balance training, Gait training, Patient/Family  education, Stair training, Vestibular training, Prosthetic training, DME instructions, and  scar mobilization   PLAN FOR NEXT SESSION:  continue work towards updated STGs, prosthetic gait with cane,  balance activities.    Jamey Reas, PT, DPT 09/12/2021, 4:08 PM

## 2021-09-14 ENCOUNTER — Ambulatory Visit: Payer: Medicare Other | Admitting: Physical Therapy

## 2021-09-14 ENCOUNTER — Encounter: Payer: Self-pay | Admitting: Physical Therapy

## 2021-09-14 DIAGNOSIS — R2681 Unsteadiness on feet: Secondary | ICD-10-CM | POA: Diagnosis not present

## 2021-09-14 DIAGNOSIS — M6281 Muscle weakness (generalized): Secondary | ICD-10-CM | POA: Diagnosis not present

## 2021-09-14 DIAGNOSIS — R2689 Other abnormalities of gait and mobility: Secondary | ICD-10-CM

## 2021-09-14 DIAGNOSIS — M25652 Stiffness of left hip, not elsewhere classified: Secondary | ICD-10-CM

## 2021-09-14 NOTE — Therapy (Signed)
OUTPATIENT PHYSICAL THERAPY PROSTHETIC TREATMENT NOTE   Patient Name: Samuel Carney MRN: [id number] DOB:February 28, 1946, 76 y.o., male Today's Date: 09/14/2021  PCP: Juluis Pitch, MD REFERRING PROVIDER: Jamse Arn, MD   PT End of Session - 09/14/21 0758     Visit Number 15    Number of Visits 25    Date for PT Re-Evaluation 10/19/21    Authorization Type [employer] Medicare   $20 co-pay    Progress Note Due on Visit 1    PT Start Time 0758    PT Stop Time 0840    PT Time Calculation (min) 42 min    Equipment Utilized During Treatment Gait belt    Activity Tolerance Patient tolerated treatment well    Behavior During Therapy WFL for tasks assessed/performed                            Past Medical History:  Diagnosis Date   Alcohol abuse    Aortic atherosclerosis ([place])    B12 deficiency    Cirrhosis ([place])    COPD (chronic obstructive pulmonary disease) ([place])    Coronary artery disease    GERD (gastroesophageal reflux disease)    Glaucoma    Grade II diastolic dysfunction    History of kidney stones    HLD (hyperlipidemia)    Hx of radiation therapy    Hypertension    Lung mass    Moderate mitral regurgitation    Pneumonia    PVD (peripheral vascular disease) ([place])    Squamous cell carcinoma of lung, right ([place]) 2019   Past Surgical History:  Procedure Laterality Date   COLONOSCOPY WITH PROPOFOL     COLONOSCOPY WITH PROPOFOL N/A 11/25/2018   Procedure: COLONOSCOPY WITH PROPOFOL;  Surgeon: Lollie Sails, MD;  Location: [facility] ENDOSCOPY;  Service: Endoscopy;  Laterality: N/A;   ENDARTERECTOMY FEMORAL Left 07/13/2020   Procedure: ENDARTERECTOMY FEMORAL ( SFA STENT);  Surgeon: Katha Cabal, MD;  Location: [facility] ORS;  Service: Vascular;  Laterality: Left;   ESOPHAGOGASTRODUODENOSCOPY (EGD) WITH PROPOFOL N/A 11/25/2018   Procedure: ESOPHAGOGASTRODUODENOSCOPY (EGD) WITH PROPOFOL;  Surgeon: Lollie Sails, MD;  Location: [facility] ENDOSCOPY;   Service: Endoscopy;  Laterality: N/A;   LEG AMPUTATION ABOVE KNEE Left    LOWER EXTREMITY ANGIOGRAPHY Left 06/07/2020   Procedure: LOWER EXTREMITY ANGIOGRAPHY;  Surgeon: Katha Cabal, MD;  Location: [place] [facility];  Service: Cardiovascular;  Laterality: Left;   LOWER EXTREMITY ANGIOGRAPHY Left 01/10/2021   Procedure: LOWER EXTREMITY ANGIOGRAPHY;  Surgeon: Katha Cabal, MD;  Location: [place] [facility];  Service: Cardiovascular;  Laterality: Left;   Patient Active Problem List   Diagnosis Date Noted   Atherosclerosis of artery of extremity with ulceration ([place]) 07/13/2020   Atherosclerosis of native arteries of the extremities with ulceration ([place]) 13/10/6576   Alcoholic cirrhosis of liver without ascites ([place]) 04/09/2019   Hyperlipidemia, mixed 04/09/2019   Malignant 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 ([place]) 04/09/2019   Thrombocytopenia ([place]) 04/09/2019   CAD (coronary artery disease) 10/14/2017   Lung cancer ([place]) 10/14/2017   Nodule of upper lobe of right lung 09/15/2017   Tobacco abuse 08/14/2017   Benign essential hypertension 09/09/2013    REFERRING DIAG:  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   PAIN:  Are you having pain?  NPRS scale:  1-2/10 when walking none when standing still or sitting.  Pain location: residual limb Pain description: ache, sore Aggravating factors: liner Relieving factors: removing liner.  Right hip 0/10   OBJECTIVE:    MUSCLE LENGTH: Hamstrings: 07/24/2021:  Right -19 deg; with hip 90* Thomas test: 07/24/2021:  Right -19 deg; Left -27 deg    POSTURE: 07/24/2021:  rounded shoulders, forward head, flexed trunk , and weight shift right   LE ROM:   Active ROM Right 07/24/21 Left 07/24/21  Hip flexion      Hip extension Standing -20* Standing  -30*  Hip abduction      Hip adduction      Hip internal rotation      Hip external rotation      Knee flexion      Knee extension      Ankle dorsiflexion      Ankle plantarflexion      Ankle inversion      Ankle eversion       (Blank rows = not tested)   MMT:   MMT Right 07/24/21 Left 07/24/21  Hip flexion 5/5 4/4  Hip extension 3/5 3-/5  Hip abduction 3/5 3-/5  Hip adduction      Hip internal rotation      Hip external rotation      Knee flexion 3/5 NA  Knee extension 4/5 NA  Ankle dorsiflexion 5/5 NA  Ankle plantarflexion   NA  Ankle inversion   NA  Ankle eversion   NA  (Blank rows = not tested)   TRANSFERS: 07/24/2021:  Sit to stand: Modified independence and requires armrests & use of BUEs 07/24/2021:  Stand to sit: Modified independence and requires armrests & use of BUEs   GAIT: 07/24/2021:  Gait pattern: hopping with good clearance RLE Distance walked: 30' Assistive device utilized: Environmental consultant - 2 wheeled Level of assistance: Modified independence Gait velocity:  Comments:    FUNCTIONAL TESTs:  Merrilee Jansky Balance Scale:  07/31/2021  11/56  [facility] PT Assessment - 07/31/21 0815                Standardized Balance Assessment    Standardized Balance Assessment Berg Balance Test          Berg Balance Test    Sit to Stand Needs minimal aid to stand or to stabilize     Standing Unsupported Needs several tries to stand 30 seconds unsupported     Sitting with Back Unsupported but Feet Supported on Floor or Stool Able to sit safely and securely 2 minutes     Stand to Sit Uses backs of legs against chair to control descent     Transfers Able to transfer safely, definite need of hands     Standing Unsupported with Eyes Closed Needs help to keep from falling     Standing  Unsupported with Feet Together Needs help to attain position and unable to hold for 15 seconds     From Standing, Reach Forward with Outstretched Arm Loses balance while trying/requires external support     From Standing Position, Pick up Object from Floor Unable to try/needs assist to keep balance     From Standing Position, Turn to Look Behind Over each Shoulder Needs assist to keep from losing  balance and falling     Turn 360 Degrees Needs assistance while turning     Standing Unsupported, Alternately Place Feet on Step/Stool Needs assistance to keep from falling or unable to try     Standing Unsupported, One Foot in [employer] balance while stepping or standing     Standing on One Leg Unable to try or needs assist to prevent fall     Total Score 11     Berg comment: BERG  < 36 high risk for falls (close to 100%) 46-51 moderate (>50%)   37-45 significant (>80%) 52-55 lower (> 25%)        CARDIOVASCULAR RESPONSE: 07/24/2021:  Functional activity: gait hopping with RW Pre-activity vitals: HR: 54 SpO2: 99 Post-activity vitals: HR: 57 SpO2: 99 Modified Borg scale for dyspnea: 1: very mild shortness of breath   CURRENT PROSTHETIC WEAR ASSESSMENT: 07/24/2021:  Patient is dependent with: skin check, residual limb care, prosthetic cleaning, ply sock cleaning, correct ply sock adjustment, proper wear schedule/adjustment, and proper weight-bearing schedule/adjustment Prosthetic wear tolerance: wearing liner 3-4 hours 2x/day, 7 days/week Edema: nonpitting Residual limb condition: cylindrical shape, no open areas, invaginated scar posteriorly, normal temperature & color, slight dryness to skin, sensation intact. Prosthetic description: [facility] plans to deliver on Thursday, 4/27.  Prosthesis is silicon liner with velcro lanyard suspension, Single axis friction engaging knee with extension assist, flexible keel foot K code/activity level with prosthetic CBS:WHQPR 2 basic community with fixed  cadence   Today's Treatment: 09/14/2021 Prosthetic Training with Transfemoral Prosthesis: Pt arrived using his RW not w/c for first time. PT adjusted his RW ht to facilitate upright posture & moved wheels to inside for narrower to clear doors.  PT review recommendation to wear all awake hours except 1-2 hours midday. Pt verbalized understanding.  Pt ambulated 100' with cane & HHA modA.   In //bars used mirror & line on floor to work on proper step width with verbal, demo & tactile cues.  Progressed from 2 bars to cane & lt bar.  Also walked 2 steps on prosthesis correct width & 1 step incorrect. Pt reported that he could feel difference.  PT demo how to use marker on front of RW. Pt amb out of clinic using RW with proper step width.   Neuromuscular Re-ed: Stepping prosthesis forward, lateral & backward and stabilizing for 5 sec then return to bilateral stance. Pt performed with minA & intermittent touch on bars.  09/12/2021 Prosthetic Training with Transfemoral Prosthesis: PT review recommendation to wear all awake hours except 1-2 hours midday. Pt verbalized understanding.  Pt ambulated 100' X 2 with cane & HHA modA.   PT demo & verbal cues on stairs with rail on left side if ascending & on right side descending by side stepping with BUEs on rail. Pt return demo 3 steps with minA & verbal cues. Pt neg with right rail ascend & left rail descend and cane in opposite hand with minA & verbal cues.   Neuromuscular Re-ed: Standing bilateral stance with feet 2-3" apart head turns with EO on floor, EC on floor & EO on foam with minA /tactile cues and intermittent touch on bars. Stepping prosthesis forward, lateral & backward and stabilizing for 5 sec then return to bilateral stance. Pt performed with minA & intermittent touch on bars.   09/07/2021 Prosthetic Training with Transfemoral Prosthesis: PT review recommendation to wear all awake hours except 1-2 hours midday. Pt verbalized understanding.   Pt ambulated 61' X 2 with  cane & HHA modA.  PT educated with demo & verbal cues on how to provide HHA. Wife assisted 2nd ambulation with PT close supervision & intermittent contact assist. PT recommended wearing shorter 1/4" lift in right shoe for 4 hours each morning then switch back to 3/8" lift.  He should continue this until he can not feel a difference.  PT assessed height in standing with 1/4" lift in right shoe and his right hip continues to be higher indicating difference in stance.  PT verbal & demo cues while pt performing neg stairs with single rail like his daughter's home.  Pt amb with RW to edge of stairs (PT instructed in need to move RW to top or bottom prior to neg). Pt neg 5 steps 7" ht with left rail / right cane for 3 steps & right rail / left cane for 2 steps. He required min guard assist. PT instructed in proper technique to assist  / guard for safety.        08/08/21 1106  PT Education  Education Details HEP at [employer]) Educated Patient;Child(ren)  Methods Explanation;Demonstration;Tactile cues;Verbal cues;Handout  Comprehension Returned demonstration;Tactile cues required;Verbalized understanding;Verbal cues required;Need further instruction   HEP for upright posture Standing with back to door frame reaching single & double UEs overhead hold 2 deep breathes 2 reps ea with target >5 times per day as only takes 1-2 minutes. Standing with back to counter with hands resting on counter for upright posture.  Goal is 5-10 minutes 2x/day. Hooklying supine dropping prosthesis over edge to stretch left hip flexors 30 sec hold 2-3 reps 2x/day.   ASSESSMENT:   CLINICAL IMPRESSION: Patient appears to understand better proper step width with prosthetic gait.  Pt improved balance with stepping prosthesis different directions.    OBJECTIVE IMPAIRMENTS Abnormal gait, cardiopulmonary status limiting activity, decreased activity tolerance, decreased balance, decreased endurance,  decreased knowledge of use of DME, decreased mobility, decreased ROM, decreased strength, impaired flexibility, postural dysfunction, and prosthetic dependency .    ACTIVITY LIMITATIONS community activity, driving, and church.    PERSONAL FACTORS Age, Fitness, Time since onset of injury/illness/exacerbation, and 3+ comorbidities: see PMH  are also affecting patient's functional outcome.    REHAB POTENTIAL: Good   CLINICAL DECISION MAKING: Evolving/moderate complexity   EVALUATION COMPLEXITY: Moderate     GOALS: Goals reviewed with patient? Yes   SHORT TERM GOALS: Target date: 09/21/2021   Patient verbalizes proper adjustment of ply socks.  Goal status: ongoing 2.  Patient tolerates prosthesis >90% of awake hours daily without skin issues or limb pain <3/10 after standing. Goal status: ongoing   3.  Patient able to pick up items from floor without UE support with supervision. Goal status: ongoing   4. Patient ambulates 100' with cane & prosthesis with modA / HHA.  Goal status: ongoing    LONG TERM GOALS: Target date: 10/19/2021   Patient demonstrates & verbalized understanding of prosthetic care to enable safe utilization of prosthesis. Baseline: SEE OBJECTIVE DATA Goal status: INITIAL   Patient tolerates prosthesis wear >90% of awake hours without skin or limb pain issues. Baseline: SEE OBJECTIVE DATA Goal status: INITIAL   Berg Balance </= 36/56 to indicate lower fall risk Baseline: SEE OBJECTIVE DATA Goal status: INITIAL   Patient ambulates >300' with LRAD & prosthesis safely independently for community mobility. Baseline: SEE OBJECTIVE DATA Goal status: INITIAL   Patient negotiates ramps, curbs & stairs with single rail with LRAD & prosthesis independently for community mobility. Baseline:  SEE OBJECTIVE DATA Goal status: INITIAL   PLAN: PT FREQUENCY: 2x/week   PT DURATION: 12 weeks   PLANNED INTERVENTIONS: Therapeutic exercises, Therapeutic activity,  Neuromuscular re-education, Balance training, Gait training, Patient/Family education, Stair training, Vestibular training, Prosthetic training, DME instructions, and scar mobilization   PLAN FOR NEXT SESSION:  check STGs over next 2 visits,  prosthetic gait with cane,  balance activities.    Jamey Reas, PT, DPT 09/14/2021, 1:32 PM

## 2021-09-18 ENCOUNTER — Encounter: Payer: Medicare Other | Admitting: Physical Therapy

## 2021-09-18 ENCOUNTER — Ambulatory Visit: Payer: Medicare Other | Admitting: Physical Therapy

## 2021-09-18 ENCOUNTER — Encounter: Payer: Self-pay | Admitting: Physical Therapy

## 2021-09-18 DIAGNOSIS — R2681 Unsteadiness on feet: Secondary | ICD-10-CM | POA: Diagnosis not present

## 2021-09-18 DIAGNOSIS — M25652 Stiffness of left hip, not elsewhere classified: Secondary | ICD-10-CM

## 2021-09-18 DIAGNOSIS — M6281 Muscle weakness (generalized): Secondary | ICD-10-CM | POA: Diagnosis not present

## 2021-09-18 DIAGNOSIS — R2689 Other abnormalities of gait and mobility: Secondary | ICD-10-CM | POA: Diagnosis not present

## 2021-09-18 DIAGNOSIS — R293 Abnormal posture: Secondary | ICD-10-CM

## 2021-09-18 NOTE — Therapy (Signed)
OUTPATIENT PHYSICAL THERAPY PROSTHETIC TREATMENT NOTE   Patient Name: Samuel Carney MRN: [id number] DOB:Feb 25, 1946, 76 y.o., male Today's Date: 09/18/2021  PCP: Juluis Pitch, MD REFERRING PROVIDER: Jamse Arn, MD   PT End of Session - 09/18/21 0946     Visit Number 16    Number of Visits 25    Date for PT Re-Evaluation 10/19/21    Authorization Type [employer] Medicare   $20 co-pay    Progress Note Due on Visit 20    PT Start Time 0930    PT Stop Time 1012    PT Time Calculation (min) 42 min    Equipment Utilized During Treatment Gait belt    Activity Tolerance Patient tolerated treatment well    Behavior During Therapy WFL for tasks assessed/performed             Past Medical History:  Diagnosis Date   Alcohol abuse    Aortic atherosclerosis ([place])    B12 deficiency    Cirrhosis ([place])    COPD (chronic obstructive pulmonary disease) ([place])    Coronary artery disease    GERD (gastroesophageal reflux disease)    Glaucoma    Grade II diastolic dysfunction    History of kidney stones    HLD (hyperlipidemia)    Hx of radiation therapy    Hypertension    Lung mass    Moderate mitral regurgitation    Pneumonia    PVD (peripheral vascular disease) ([place])    Squamous cell carcinoma of lung, right ([place]) 2019   Past Surgical History:  Procedure Laterality Date   COLONOSCOPY WITH PROPOFOL     COLONOSCOPY WITH PROPOFOL N/A 11/25/2018   Procedure: COLONOSCOPY WITH PROPOFOL;  Surgeon: Lollie Sails, MD;  Location: [facility] ENDOSCOPY;  Service: Endoscopy;  Laterality: N/A;   ENDARTERECTOMY FEMORAL Left 07/13/2020   Procedure: ENDARTERECTOMY FEMORAL ( SFA STENT);  Surgeon: Katha Cabal, MD;  Location: [facility] ORS;  Service: Vascular;  Laterality: Left;   ESOPHAGOGASTRODUODENOSCOPY (EGD) WITH PROPOFOL N/A 11/25/2018   Procedure: ESOPHAGOGASTRODUODENOSCOPY (EGD) WITH PROPOFOL;  Surgeon: Lollie Sails, MD;  Location: [facility] ENDOSCOPY;  Service: Endoscopy;  Laterality:  N/A;   LEG AMPUTATION ABOVE KNEE Left    LOWER EXTREMITY ANGIOGRAPHY Left 06/07/2020   Procedure: LOWER EXTREMITY ANGIOGRAPHY;  Surgeon: Katha Cabal, MD;  Location: [facility];  Service: Cardiovascular;  Laterality: Left;   LOWER EXTREMITY ANGIOGRAPHY Left 01/10/2021   Procedure: LOWER EXTREMITY ANGIOGRAPHY;  Surgeon: Katha Cabal, MD;  Location: [facility];  Service: Cardiovascular;  Laterality: Left;   Patient Active Problem List   Diagnosis Date Noted   Atherosclerosis of artery of extremity with ulceration ([place]) 07/13/2020   Atherosclerosis of native arteries of the extremities with ulceration ([place]) 35/00/9381   Alcoholic cirrhosis of liver without ascites ([place]) 04/09/2019   Hyperlipidemia, mixed 04/09/2019   Malignant 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 ([place]) 04/09/2019   Thrombocytopenia ([place]) 04/09/2019   CAD (coronary artery disease) 10/14/2017   Lung cancer ([place]) 10/14/2017   Nodule of upper lobe of right lung 09/15/2017   Tobacco abuse 08/14/2017   Benign essential hypertension 09/09/2013    REFERRING DIAG:  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  PAIN:  Are you having pain?  NPRS scale:  1-2/10 when walking none when standing still or sitting.  Pain location: residual limb Pain description: ache, sore Aggravating factors: liner Relieving factors: removing liner.  Right hip 0/10   OBJECTIVE:    MUSCLE LENGTH: Hamstrings: 07/24/2021:  Right -19 deg; with hip 90* Thomas test:  07/24/2021:  Right -19 deg; Left -27 deg   POSTURE: 07/24/2021:  rounded shoulders, forward head, flexed trunk , and weight shift right   LE ROM:   Active ROM Right 07/24/21 Left 07/24/21  Hip flexion      Hip extension Standing -20* Standing  -30*  Hip abduction      Hip adduction      Hip internal rotation      Hip external rotation      Knee flexion      Knee extension      Ankle dorsiflexion      Ankle plantarflexion      Ankle inversion      Ankle eversion       (Blank rows = not tested)   MMT:   MMT Right 07/24/21 Left 07/24/21  Hip flexion 5/5 4/4  Hip extension 3/5 3-/5  Hip abduction 3/5 3-/5  Hip adduction      Hip internal rotation      Hip external rotation      Knee flexion 3/5 NA  Knee extension 4/5 NA  Ankle dorsiflexion 5/5 NA  Ankle plantarflexion   NA  Ankle inversion   NA  Ankle eversion   NA  (Blank rows = not tested)   TRANSFERS: 07/24/2021:  Sit to stand: Modified independence and requires armrests & use of BUEs 07/24/2021:  Stand to sit: Modified independence and requires armrests & use of BUEs   GAIT: 07/24/2021:  Gait pattern: hopping with good clearance RLE Distance walked: 30' Assistive device utilized: Environmental consultant - 2 wheeled Level of assistance: Modified independence Gait velocity:  Comments:    FUNCTIONAL TESTs:  Merrilee Jansky Balance Scale:  07/31/2021  11/56  [facility] PT Assessment - 07/31/21 0815                Standardized Balance Assessment    Standardized Balance Assessment Berg Balance Test          Berg Balance Test    Sit to Stand Needs minimal aid to stand or to stabilize     Standing Unsupported Needs several tries to stand 30 seconds unsupported     Sitting with Back Unsupported but Feet Supported on Floor or Stool Able to sit safely and securely 2 minutes     Stand to Sit Uses backs of legs against chair to control descent     Transfers Able to transfer safely, definite need of hands     Standing Unsupported with Eyes Closed Needs  help to keep from falling     Standing Unsupported with Feet Together Needs help to attain position and unable to hold for 15 seconds     From Standing, Reach Forward with Outstretched Arm Loses balance while trying/requires external support     From Standing Position, Pick up Object from Floor Unable to try/needs assist to keep balance     From Standing Position, Turn to Look Behind Over each Shoulder Needs assist to keep from  losing balance and falling     Turn 360 Degrees Needs assistance while turning     Standing Unsupported, Alternately Place Feet on Step/Stool Needs assistance to keep from falling or unable to try     Standing Unsupported, One Foot in [employer] balance while stepping or standing     Standing on One Leg Unable to try or needs assist to prevent fall     Total Score 11     Berg comment: BERG  < 36 high risk for falls (close to 100%) 46-51 moderate (>50%)   37-45 significant (>80%) 52-55 lower (> 25%)        CARDIOVASCULAR RESPONSE: 07/24/2021:  Functional activity: gait hopping with RW Pre-activity vitals: HR: 54 SpO2: 99 Post-activity vitals: HR: 57 SpO2: 99 Modified Borg scale for dyspnea: 1: very mild shortness of breath   CURRENT PROSTHETIC WEAR ASSESSMENT: 07/24/2021:  Patient is dependent with: skin check, residual limb care, prosthetic cleaning, ply sock cleaning, correct ply sock adjustment, proper wear schedule/adjustment, and proper weight-bearing schedule/adjustment Prosthetic wear tolerance: wearing liner 3-4 hours 2x/day, 7 days/week Edema: nonpitting Residual limb condition: cylindrical shape, no open areas, invaginated scar posteriorly, normal temperature & color, slight dryness to skin, sensation intact. Prosthetic description: [facility] plans to deliver on Thursday, 4/27.  Prosthesis is silicon liner with velcro lanyard suspension, Single axis friction engaging knee with extension assist, flexible keel foot K code/activity level with prosthetic  YDX:AJOIN 2 basic community with fixed cadence   Today's Treatment: 09/18/2021 Prosthetic Training with Transfemoral Prosthesis: Demonstrated and reviewed how to pick items up off the floor, started with one UE support on RW and progressed to no UE support with supervision needed. Discussed gradual increase in wear time and he currently wears prosthesis about 5 hours then break and another 3 hours or he wears it about 7 hours one time.  Pt ambulated 100' x 2 and 75 feet X 1with cane & HHA min A.   Leg press DL 50# 3 X15 Neuromuscular Re-ed: Walking in bars with intermittent UE support PRN fwd, lateral, and retro X 3 round trips each  09/14/2021 Prosthetic Training with Transfemoral Prosthesis: Pt arrived using his RW not w/c for first time. PT adjusted his RW ht to facilitate upright posture & moved wheels to inside for narrower to clear doors.  PT review recommendation to wear all awake hours except 1-2 hours midday. Pt verbalized understanding.  Pt ambulated 100' with cane & HHA modA.   In //bars used mirror & line on floor to work on proper step width with verbal, demo & tactile cues.  Progressed from 2 bars to cane & lt bar.  Also walked 2 steps on prosthesis correct width & 1 step incorrect. Pt reported that he could feel difference.  PT demo how to use marker on front of RW. Pt amb out of clinic using RW with proper step width.   Neuromuscular Re-ed: Stepping prosthesis forward, lateral & backward and stabilizing for 5 sec then return to bilateral stance. Pt performed with minA & intermittent touch on bars.  09/12/2021 Prosthetic Training with Transfemoral Prosthesis: PT review recommendation to wear all awake hours except 1-2 hours midday. Pt verbalized understanding.  Pt ambulated 100' X 2 with cane & HHA modA.   PT demo & verbal cues on stairs with rail on left side if ascending & on right side descending by side stepping with BUEs on rail. Pt return demo 3 steps with minA & verbal  cues. Pt  neg with right rail ascend & left rail descend and cane in opposite hand with minA & verbal cues.   Neuromuscular Re-ed: Standing bilateral stance with feet 2-3" apart head turns with EO on floor, EC on floor & EO on foam with minA /tactile cues and intermittent touch on bars. Stepping prosthesis forward, lateral & backward and stabilizing for 5 sec then return to bilateral stance. Pt performed with minA & intermittent touch on bars.   09/07/2021 Prosthetic Training with Transfemoral Prosthesis: PT review recommendation to wear all awake hours except 1-2 hours midday. Pt verbalized understanding.  Pt ambulated 80' X 2 with cane & HHA modA.  PT educated with demo & verbal cues on how to provide HHA. Wife assisted 2nd ambulation with PT close supervision & intermittent contact assist. PT recommended wearing shorter 1/4" lift in right shoe for 4 hours each morning then switch back to 3/8" lift.  He should continue this until he can not feel a difference.  PT assessed height in standing with 1/4" lift in right shoe and his right hip continues to be higher indicating difference in stance.  PT verbal & demo cues while pt performing neg stairs with single rail like his daughter's home.  Pt amb with RW to edge of stairs (PT instructed in need to move RW to top or bottom prior to neg). Pt neg 5 steps 7" ht with left rail / right cane for 3 steps & right rail / left cane for 2 steps. He required min guard assist. PT instructed in proper technique to assist  / guard for safety.        08/08/21 1106  PT Education  Education Details HEP at [employer]) Educated Patient;Child(ren)  Methods Explanation;Demonstration;Tactile cues;Verbal cues;Handout  Comprehension Returned demonstration;Tactile cues required;Verbalized understanding;Verbal cues required;Need further instruction   HEP for upright posture Standing with back to door frame reaching single & double UEs overhead hold 2 deep breathes 2  reps ea with target >5 times per day as only takes 1-2 minutes. Standing with back to counter with hands resting on counter for upright posture.  Goal is 5-10 minutes 2x/day. Hooklying supine dropping prosthesis over edge to stretch left hip flexors 30 sec hold 2-3 reps 2x/day.   ASSESSMENT:   CLINICAL IMPRESSION: He is overall progressing well with PT and has met STG #3 and 4 this session. We worked to improve his overall gait with SPC but still needs HHA with other hand for balance at this time.   OBJECTIVE IMPAIRMENTS Abnormal gait, cardiopulmonary status limiting activity, decreased activity tolerance, decreased balance, decreased endurance, decreased knowledge of use of DME, decreased mobility, decreased ROM, decreased strength, impaired flexibility, postural dysfunction, and prosthetic dependency .    ACTIVITY LIMITATIONS community activity, driving, and church.    PERSONAL FACTORS Age, Fitness, Time since onset of injury/illness/exacerbation, and 3+ comorbidities: see PMH  are also affecting patient's functional outcome.    REHAB POTENTIAL: Good   CLINICAL DECISION MAKING: Evolving/moderate complexity   EVALUATION COMPLEXITY: Moderate     GOALS: Goals reviewed with patient? Yes   SHORT TERM GOALS: Target date: 09/21/2021   Patient verbalizes proper adjustment of ply socks.  Goal status: ongoing 2.  Patient tolerates prosthesis >90% of awake hours daily without skin issues or limb pain <3/10 after standing. Goal status: ongoing as of  09/18/21    3.  Patient able to pick up items from floor without UE support with supervision. Goal status MET 09/18/21   4.  Patient ambulates 100' with cane & prosthesis with modA / HHA.  Goal status: MET 6/19/23s    LONG TERM GOALS: Target date: 10/19/2021   Patient demonstrates & verbalized understanding of prosthetic care to enable safe utilization of prosthesis. Baseline: SEE OBJECTIVE DATA Goal status: INITIAL   Patient tolerates  prosthesis wear >90% of awake hours without skin or limb pain issues. Baseline: SEE OBJECTIVE DATA Goal status: INITIAL   Berg Balance </= 36/56 to indicate lower fall risk Baseline: SEE OBJECTIVE DATA Goal status: INITIAL   Patient ambulates >300' with LRAD & prosthesis safely independently for community mobility. Baseline: SEE OBJECTIVE DATA Goal status: INITIAL   Patient negotiates ramps, curbs & stairs with single rail with LRAD & prosthesis independently for community mobility. Baseline: SEE OBJECTIVE DATA Goal status: INITIAL   PLAN: PT FREQUENCY: 2x/week   PT DURATION: 12 weeks   PLANNED INTERVENTIONS: Therapeutic exercises, Therapeutic activity, Neuromuscular re-education, Balance training, Gait training, Patient/Family education, Stair training, Vestibular training, Prosthetic training, DME instructions, and scar mobilization   PLAN FOR NEXT SESSION:  check remaining STG, balance and gait progressions as able.  Debbe Odea, PT, DPT 09/18/2021, 9:47 AM

## 2021-09-20 ENCOUNTER — Ambulatory Visit (INDEPENDENT_AMBULATORY_CARE_PROVIDER_SITE_OTHER): Payer: Medicare Other | Admitting: Physical Therapy

## 2021-09-20 ENCOUNTER — Encounter: Payer: Self-pay | Admitting: Physical Therapy

## 2021-09-20 DIAGNOSIS — M6281 Muscle weakness (generalized): Secondary | ICD-10-CM

## 2021-09-20 DIAGNOSIS — R2689 Other abnormalities of gait and mobility: Secondary | ICD-10-CM

## 2021-09-20 DIAGNOSIS — R2681 Unsteadiness on feet: Secondary | ICD-10-CM

## 2021-09-20 DIAGNOSIS — M25652 Stiffness of left hip, not elsewhere classified: Secondary | ICD-10-CM

## 2021-09-20 DIAGNOSIS — R293 Abnormal posture: Secondary | ICD-10-CM

## 2021-09-20 NOTE — Therapy (Signed)
OUTPATIENT PHYSICAL THERAPY PROSTHETIC TREATMENT NOTE   Patient Name: Samuel Carney MRN: [id number] DOB:Nov 24, 1945, 76 y.o., male Today's Date: 09/20/2021  PCP: Juluis Pitch, MD REFERRING PROVIDER: Jamse Arn, MD   PT End of Session - 09/20/21 1102     Visit Number 17    Number of Visits 25    Date for PT Re-Evaluation 10/19/21    Authorization Type [employer] Medicare   $20 co-pay    Progress Note Due on Visit 39    PT Start Time 0935    PT Stop Time 1055    PT Time Calculation (min) 80 min    Equipment Utilized During Treatment Gait belt    Activity Tolerance Patient tolerated treatment well    Behavior During Therapy WFL for tasks assessed/performed              Past Medical History:  Diagnosis Date   Alcohol abuse    Aortic atherosclerosis ([place])    B12 deficiency    Cirrhosis ([place])    COPD (chronic obstructive pulmonary disease) ([place])    Coronary artery disease    GERD (gastroesophageal reflux disease)    Glaucoma    Grade II diastolic dysfunction    History of kidney stones    HLD (hyperlipidemia)    Hx of radiation therapy    Hypertension    Lung mass    Moderate mitral regurgitation    Pneumonia    PVD (peripheral vascular disease) ([place])    Squamous cell carcinoma of lung, right ([place]) 2019   Past Surgical History:  Procedure Laterality Date   COLONOSCOPY WITH PROPOFOL     COLONOSCOPY WITH PROPOFOL N/A 11/25/2018   Procedure: COLONOSCOPY WITH PROPOFOL;  Surgeon: Lollie Sails, MD;  Location: [facility] ENDOSCOPY;  Service: Endoscopy;  Laterality: N/A;   ENDARTERECTOMY FEMORAL Left 07/13/2020   Procedure: ENDARTERECTOMY FEMORAL ( SFA STENT);  Surgeon: Katha Cabal, MD;  Location: [facility] ORS;  Service: Vascular;  Laterality: Left;   ESOPHAGOGASTRODUODENOSCOPY (EGD) WITH PROPOFOL N/A 11/25/2018   Procedure: ESOPHAGOGASTRODUODENOSCOPY (EGD) WITH PROPOFOL;  Surgeon: Lollie Sails, MD;  Location: [facility] ENDOSCOPY;  Service: Endoscopy;   Laterality: N/A;   LEG AMPUTATION ABOVE KNEE Left    LOWER EXTREMITY ANGIOGRAPHY Left 06/07/2020   Procedure: LOWER EXTREMITY ANGIOGRAPHY;  Surgeon: Katha Cabal, MD;  Location: [place] CV LAB;  Service: Cardiovascular;  Laterality: Left;   LOWER EXTREMITY ANGIOGRAPHY Left 01/10/2021   Procedure: LOWER EXTREMITY ANGIOGRAPHY;  Surgeon: Katha Cabal, MD;  Location: [place] [facility];  Service: Cardiovascular;  Laterality: Left;   Patient Active Problem List   Diagnosis Date Noted   Atherosclerosis of artery of extremity with ulceration ([place]) 07/13/2020   Atherosclerosis of native arteries of the extremities with ulceration ([place]) 01/60/1093   Alcoholic cirrhosis of liver without ascites ([place]) 04/09/2019   Hyperlipidemia, mixed 04/09/2019   Malignant 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 ([place]) 04/09/2019   Thrombocytopenia ([place]) 04/09/2019   CAD (coronary artery disease) 10/14/2017   Lung cancer (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  He was able to use RW & drive to his sister's house by himself without any issues.   PAIN:  Are you having pain?  NPRS scale:  up to 9/10 when walking none when standing still or sitting.  Pain location: residual limb Pain description: ache, sore Aggravating factors: liner Relieving factors: removing liner.  Right hip 0/10   OBJECTIVE:    MUSCLE  LENGTH: Hamstrings: 07/24/2021:  Right -19 deg; with hip 90* Thomas test: 07/24/2021:  Right -19 deg; Left -27 deg   POSTURE: 07/24/2021:  rounded shoulders, forward head, flexed trunk , and weight shift right   LE ROM:   Active ROM Right 07/24/21 Left 07/24/21  Hip flexion      Hip extension Standing -20* Standing  -30*  Hip abduction      Hip adduction      Hip internal rotation      Hip external rotation      Knee flexion      Knee extension      Ankle dorsiflexion      Ankle plantarflexion      Ankle inversion      Ankle eversion       (Blank rows = not tested)   MMT:   MMT Right 07/24/21 Left 07/24/21  Hip flexion 5/5 4/4  Hip extension 3/5 3-/5  Hip abduction 3/5 3-/5  Hip adduction      Hip internal rotation      Hip external rotation      Knee flexion 3/5 NA  Knee extension 4/5 NA  Ankle dorsiflexion 5/5 NA  Ankle plantarflexion   NA  Ankle inversion   NA  Ankle eversion   NA  (Blank rows = not tested)   TRANSFERS: 07/24/2021:  Sit to stand: Modified independence and requires armrests & use of BUEs 07/24/2021:  Stand to sit: Modified independence and requires armrests & use of BUEs   GAIT: 07/24/2021:  Gait pattern: hopping with good clearance RLE Distance walked: 30' Assistive device utilized: Environmental consultant - 2 wheeled Level of assistance: Modified independence Gait velocity:  Comments:    FUNCTIONAL TESTs:  Merrilee Jansky Balance Scale:  07/31/2021  11/56  [facility] PT Assessment - 07/31/21 0815                Standardized Balance Assessment    Standardized Balance Assessment Berg Balance Test          Berg Balance Test    Sit to Stand Needs minimal aid to stand or to stabilize     Standing Unsupported Needs several tries to stand 30 seconds unsupported     Sitting with Back Unsupported but Feet Supported on Floor or Stool Able to sit safely and securely 2 minutes     Stand to Sit Uses backs of legs against chair to control descent     Transfers Able to transfer  safely, definite need of hands     Standing Unsupported with Eyes Closed Needs help to keep from falling     Standing Unsupported with Feet Together Needs help to attain position and unable to hold for 15 seconds     From Standing, Reach Forward with Outstretched Arm Loses balance while trying/requires external support     From Standing Position, Pick up Object from Floor Unable to try/needs assist to keep balance     From Standing Position,  Turn to Look Behind Over each Shoulder Needs assist to keep from losing balance and falling     Turn 360 Degrees Needs assistance while turning     Standing Unsupported, Alternately Place Feet on Step/Stool Needs assistance to keep from falling or unable to try     Standing Unsupported, One Foot in [employer] balance while stepping or standing     Standing on One Leg Unable to try or needs assist to prevent fall     Total Score 11     Berg comment: BERG  < 36 high risk for falls (close to 100%) 46-51 moderate (>50%)   37-45 significant (>80%) 52-55 lower (> 25%)        CARDIOVASCULAR RESPONSE: 07/24/2021:  Functional activity: gait hopping with RW Pre-activity vitals: HR: 54 SpO2: 99 Post-activity vitals: HR: 57 SpO2: 99 Modified Borg scale for dyspnea: 1: very mild shortness of breath   CURRENT PROSTHETIC WEAR ASSESSMENT: 07/24/2021:  Patient is dependent with: skin check, residual limb care, prosthetic cleaning, ply sock cleaning, correct ply sock adjustment, proper wear schedule/adjustment, and proper weight-bearing schedule/adjustment Prosthetic wear tolerance: wearing liner 3-4 hours 2x/day, 7 days/week Edema: nonpitting Residual limb condition: cylindrical shape, no open areas, invaginated scar posteriorly, normal temperature & color, slight dryness to skin, sensation intact. Prosthetic description: [facility] plans to deliver on Thursday, 4/27.  Prosthesis is silicon liner with velcro lanyard suspension, Single axis friction engaging knee  with extension assist, flexible keel foot K code/activity level with prosthetic [id number] 2 basic community with fixed cadence   Today's Treatment: 09/20/2021 Suspension strap was loose with his limb having seated deeper into socket. PT went to tighten strap & D-ring strap broke. PT spoke to prosthetist who can repair it this morning.  Patient's wife took prosthesis to be repaired.  Therapeutic exercise: Nustep with RLE & BUEs level 6 for 8 min. Hamstring stretch RLE long sit strap DF knee ext with forward trunk flexion 30 sec 3 reps Hip flexor stretch PT manual assist start with bil knee to chest to decrease lordosis then single LE ext 30 sec hold 3 reps ea LE Leg press RLE only 75# 20 reps with back 45* and 68# 20 reps with back flat  Self-Care: Pt plans to go to beach with family over the summer.  PT educated on pool lift at hotels for easier access, beach w/c and using pool shoe or anti-slip sock to protect RLE.  Pt & wife verbalized understanding.  Prosthetic Training with Transfemoral Prosthesis: Pt donned prosthesis with minimal cues for rotation alignment. PT verbally reviewed adjusting ply socks. Pt & wife verbalized understanding.  Pt ambulated with cane 65' with HHA & 110' without HHA both with minA majority of time with intermittent modA when prosthetic knee not extended at initial contact.   09/18/2021 Prosthetic Training with Transfemoral Prosthesis: Demonstrated and reviewed how to pick items up off the floor, started with one UE support on RW and progressed to no UE support with supervision needed. Discussed gradual increase in wear time and he currently wears prosthesis about 5 hours then break and another 3 hours or he wears it about 7 hours one time.  Pt ambulated 100' x 2 and 75 feet X 1with cane & HHA min A.   Leg press DL 50# 3 X15 Neuromuscular Re-ed: Walking in bars with intermittent UE support PRN fwd, lateral, and retro X 3 round trips each  09/14/2021 Prosthetic  Training with Transfemoral Prosthesis: Pt arrived using his RW  not w/c for first time. PT adjusted his RW ht to facilitate upright posture & moved wheels to inside for narrower to clear doors.  PT review recommendation to wear all awake hours except 1-2 hours midday. Pt verbalized understanding.  Pt ambulated 100' with cane & HHA modA.   In //bars used mirror & line on floor to work on proper step width with verbal, demo & tactile cues.  Progressed from 2 bars to cane & lt bar.  Also walked 2 steps on prosthesis correct width & 1 step incorrect. Pt reported that he could feel difference.  PT demo how to use marker on front of RW. Pt amb out of clinic using RW with proper step width.   Neuromuscular Re-ed: Stepping prosthesis forward, lateral & backward and stabilizing for 5 sec then return to bilateral stance. Pt performed with minA & intermittent touch on bars.     08/08/21 1106  PT Education  Education Details HEP at [employer]) Educated Patient;Child(ren)  Methods Explanation;Demonstration;Tactile cues;Verbal cues;Handout  Comprehension Returned demonstration;Tactile cues required;Verbalized understanding;Verbal cues required;Need further instruction   HEP for upright posture Standing with back to door frame reaching single & double UEs overhead hold 2 deep breathes 2 reps ea with target >5 times per day as only takes 1-2 minutes. Standing with back to counter with hands resting on counter for upright posture.  Goal is 5-10 minutes 2x/day. Hooklying supine dropping prosthesis over edge to stretch left hip flexors 30 sec hold 2-3 reps 2x/day.   ASSESSMENT:   CLINICAL IMPRESSION: Patient functionally reports able to go into community without family using his prosthesis & RW.  Pt is slowly improving prosthetic gait with cane.  He met or partially met remaining 2 STGs. Pt continues to benefit from skilled PT to improve function & safety with his Transfemoral prosthesis.   OBJECTIVE  IMPAIRMENTS Abnormal gait, cardiopulmonary status limiting activity, decreased activity tolerance, decreased balance, decreased endurance, decreased knowledge of use of DME, decreased mobility, decreased ROM, decreased strength, impaired flexibility, postural dysfunction, and prosthetic dependency .    ACTIVITY LIMITATIONS community activity, driving, and church.    PERSONAL FACTORS Age, Fitness, Time since onset of injury/illness/exacerbation, and 3+ comorbidities: see PMH  are also affecting patient's functional outcome.    REHAB POTENTIAL: Good   CLINICAL DECISION MAKING: Evolving/moderate complexity   EVALUATION COMPLEXITY: Moderate     GOALS: Goals reviewed with patient? Yes   SHORT TERM GOALS: Target date: 09/21/2021   Patient verbalizes proper adjustment of ply socks.  Goal status: MET 09/20/2021 2.  Patient tolerates prosthesis >90% of awake hours daily without skin issues or limb pain <3/10 after standing. Goal status: partially met 6/21/203 as wearing prosthesis >90% awake hours with no skin issues but has limb pain up to 9/10 with standing.     3.  Patient able to pick up items from floor without UE support with supervision. Goal status MET 09/18/21   4. Patient ambulates 100' with cane & prosthesis with modA / HHA.  Goal status: MET 6/19/23s    LONG TERM GOALS: Target date: 10/19/2021   Patient demonstrates & verbalized understanding of prosthetic care to enable safe utilization of prosthesis. Baseline: SEE OBJECTIVE DATA Goal status: INITIAL   Patient tolerates prosthesis wear >90% of awake hours without skin or limb pain issues. Baseline: SEE OBJECTIVE DATA Goal status: INITIAL   Berg Balance </= 36/56 to indicate lower fall risk Baseline: SEE OBJECTIVE DATA Goal status: INITIAL   Patient ambulates >300'  with LRAD & prosthesis safely independently for community mobility. Baseline: SEE OBJECTIVE DATA Goal status: INITIAL   Patient negotiates ramps, curbs &  stairs with single rail with LRAD & prosthesis independently for community mobility. Baseline: SEE OBJECTIVE DATA Goal status: INITIAL   PLAN: PT FREQUENCY: 2x/week   PT DURATION: 12 weeks   PLANNED INTERVENTIONS: Therapeutic exercises, Therapeutic activity, Neuromuscular re-education, Balance training, Gait training, Patient/Family education, Stair training, Vestibular training, Prosthetic training, DME instructions, and scar mobilization   PLAN FOR NEXT SESSION:  work towards LTGs, balance and gait progressions as able.  Jamey Reas, PT, DPT 09/20/2021, 11:04 AM

## 2021-09-22 ENCOUNTER — Encounter: Payer: Medicare Other | Admitting: Physical Therapy

## 2021-09-25 ENCOUNTER — Encounter: Payer: Self-pay | Admitting: Physical Therapy

## 2021-09-25 ENCOUNTER — Ambulatory Visit: Payer: Medicare Other | Admitting: Physical Therapy

## 2021-09-25 DIAGNOSIS — M25652 Stiffness of left hip, not elsewhere classified: Secondary | ICD-10-CM | POA: Diagnosis not present

## 2021-09-25 DIAGNOSIS — R2681 Unsteadiness on feet: Secondary | ICD-10-CM

## 2021-09-25 DIAGNOSIS — R2689 Other abnormalities of gait and mobility: Secondary | ICD-10-CM

## 2021-09-25 DIAGNOSIS — M6281 Muscle weakness (generalized): Secondary | ICD-10-CM

## 2021-09-25 DIAGNOSIS — R293 Abnormal posture: Secondary | ICD-10-CM

## 2021-09-27 ENCOUNTER — Ambulatory Visit: Payer: Medicare Other | Admitting: Physical Therapy

## 2021-09-27 ENCOUNTER — Encounter: Payer: Self-pay | Admitting: Physical Therapy

## 2021-09-27 DIAGNOSIS — R2681 Unsteadiness on feet: Secondary | ICD-10-CM

## 2021-09-27 DIAGNOSIS — M6281 Muscle weakness (generalized): Secondary | ICD-10-CM

## 2021-09-27 DIAGNOSIS — R2689 Other abnormalities of gait and mobility: Secondary | ICD-10-CM

## 2021-09-27 DIAGNOSIS — M25652 Stiffness of left hip, not elsewhere classified: Secondary | ICD-10-CM

## 2021-09-27 DIAGNOSIS — R293 Abnormal posture: Secondary | ICD-10-CM

## 2021-09-27 NOTE — Therapy (Signed)
OUTPATIENT PHYSICAL THERAPY PROSTHETIC TREATMENT NOTE   Patient Name: Samuel Carney MRN: [id number] DOB:1945/05/20, 76 y.o., male Today's Date: 09/27/2021  PCP: Juluis Pitch, MD REFERRING PROVIDER: Jamse Arn, MD   PT End of Session - 09/27/21 [phone]     Visit Number 19    Number of Visits 25    Date for PT Re-Evaluation 10/19/21    Authorization Type [employer] Medicare   $20 co-pay    Progress Note Due on Visit 14    PT Start Time 0926    PT Stop Time 1007    PT Time Calculation (min) 41 min    Equipment Utilized During Treatment Gait belt    Activity Tolerance Patient tolerated treatment well    Behavior During Therapy WFL for tasks assessed/performed                Past Medical History:  Diagnosis Date   Alcohol abuse    Aortic atherosclerosis ([place])    B12 deficiency    Cirrhosis ([place])    COPD (chronic obstructive pulmonary disease) ([place])    Coronary artery disease    GERD (gastroesophageal reflux disease)    Glaucoma    Grade II diastolic dysfunction    History of kidney stones    HLD (hyperlipidemia)    Hx of radiation therapy    Hypertension    Lung mass    Moderate mitral regurgitation    Pneumonia    PVD (peripheral vascular disease) ([place])    Squamous cell carcinoma of lung, right ([place]) 2019   Past Surgical History:  Procedure Laterality Date   COLONOSCOPY WITH PROPOFOL     COLONOSCOPY WITH PROPOFOL N/A 11/25/2018   Procedure: COLONOSCOPY WITH PROPOFOL;  Surgeon: Lollie Sails, MD;  Location: [facility] ENDOSCOPY;  Service: Endoscopy;  Laterality: N/A;   ENDARTERECTOMY FEMORAL Left 07/13/2020   Procedure: ENDARTERECTOMY FEMORAL ( SFA STENT);  Surgeon: Katha Cabal, MD;  Location: [facility] ORS;  Service: Vascular;  Laterality: Left;   ESOPHAGOGASTRODUODENOSCOPY (EGD) WITH PROPOFOL N/A 11/25/2018   Procedure: ESOPHAGOGASTRODUODENOSCOPY (EGD) WITH PROPOFOL;  Surgeon: Lollie Sails, MD;  Location: [facility] ENDOSCOPY;  Service: Endoscopy;   Laterality: N/A;   LEG AMPUTATION ABOVE KNEE Left    LOWER EXTREMITY ANGIOGRAPHY Left 06/07/2020   Procedure: LOWER EXTREMITY ANGIOGRAPHY;  Surgeon: Katha Cabal, MD;  Location: [place] [facility];  Service: Cardiovascular;  Laterality: Left;   LOWER EXTREMITY ANGIOGRAPHY Left 01/10/2021   Procedure: LOWER EXTREMITY ANGIOGRAPHY;  Surgeon: Katha Cabal, MD;  Location: [place] [facility];  Service: Cardiovascular;  Laterality: Left;   Patient Active Problem List   Diagnosis Date Noted   Atherosclerosis of artery of extremity with ulceration ([place]) 07/13/2020   Atherosclerosis of native arteries of the extremities with ulceration ([place]) 83/41/9622   Alcoholic cirrhosis of liver without ascites ([place]) 04/09/2019   Hyperlipidemia, mixed 04/09/2019   Malignant 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 ([place]) 04/09/2019   Thrombocytopenia ([place]) 04/09/2019   CAD (coronary artery disease) 10/14/2017   Lung cancer ([place]) 10/14/2017   Nodule of upper lobe of right lung 09/15/2017   Tobacco abuse 08/14/2017   Benign essential hypertension 09/09/2013    REFERRING DIAG:  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 He took prosthesis off for remainder of Monday. He wore prosthesis this morning.  His limb is not as sore this morning.   PAIN:  Are you having pain?  NPRS scale:   6/10 when walking none when standing still or sitting.  Pain location: residual limb Pain description: ache, sore Aggravating factors: liner Relieving factors: removing liner.  Right hip 0/10   OBJECTIVE:     MUSCLE LENGTH: Hamstrings: 07/24/2021:  Right -19 deg; with hip 90* Thomas test: 07/24/2021:  Right -19 deg; Left -27 deg   POSTURE: 07/24/2021:  rounded shoulders, forward head, flexed trunk , and weight shift right   LE ROM:   Active ROM Right 07/24/21 Left 07/24/21  Hip flexion      Hip extension Standing -20* Standing  -30*  Hip abduction      Hip adduction      Hip internal rotation      Hip external rotation      Knee flexion      Knee extension      Ankle dorsiflexion      Ankle plantarflexion      Ankle inversion      Ankle eversion       (Blank rows = not tested)   MMT:   MMT Right 07/24/21 Left 07/24/21  Hip flexion 5/5 4/4  Hip extension 3/5 3-/5  Hip abduction 3/5 3-/5  Hip adduction      Hip internal rotation      Hip external rotation      Knee flexion 3/5 NA  Knee extension 4/5 NA  Ankle dorsiflexion 5/5 NA  Ankle plantarflexion   NA  Ankle inversion   NA  Ankle eversion   NA  (Blank rows = not tested)   TRANSFERS: 07/24/2021:  Sit to stand: Modified independence and requires armrests & use of BUEs 07/24/2021:  Stand to sit: Modified independence and requires armrests & use of BUEs   GAIT: 07/24/2021:  Gait pattern: hopping with good clearance RLE Distance walked: 30' Assistive device utilized: Environmental consultant - 2 wheeled Level of assistance: Modified independence Gait velocity:  Comments:    FUNCTIONAL TESTs:  Merrilee Jansky Balance Scale:  07/31/2021  11/56  [facility] PT Assessment - 07/31/21 0815                Standardized Balance Assessment    Standardized Balance Assessment Berg Balance Test          Berg Balance Test    Sit to Stand Needs minimal aid to stand or to stabilize     Standing Unsupported Needs several tries to stand 30 seconds unsupported     Sitting with Back Unsupported but Feet Supported on Floor or Stool Able to sit safely and securely 2 minutes     Stand to Sit Uses backs of legs against chair to control descent     Transfers Able to  transfer safely, definite need of hands     Standing Unsupported with Eyes Closed Needs help to keep from falling     Standing Unsupported with Feet Together Needs help to attain position and unable to hold for 15 seconds     From Standing, Reach Forward with Outstretched Arm Loses balance while trying/requires external support     From Standing Position, Pick up Object from Floor Unable to try/needs assist to keep balance  From Standing Position, Turn to Look Behind Over each Shoulder Needs assist to keep from losing balance and falling     Turn 360 Degrees Needs assistance while turning     Standing Unsupported, Alternately Place Feet on Step/Stool Needs assistance to keep from falling or unable to try     Standing Unsupported, One Foot in [employer] balance while stepping or standing     Standing on One Leg Unable to try or needs assist to prevent fall     Total Score 11     Berg comment: BERG  < 36 high risk for falls (close to 100%) 46-51 moderate (>50%)   37-45 significant (>80%) 52-55 lower (> 25%)        CARDIOVASCULAR RESPONSE: 07/24/2021:  Functional activity: gait hopping with RW Pre-activity vitals: HR: 54 SpO2: 99 Post-activity vitals: HR: 57 SpO2: 99 Modified Borg scale for dyspnea: 1: very mild shortness of breath   CURRENT PROSTHETIC WEAR ASSESSMENT: 07/24/2021:  Patient is dependent with: skin check, residual limb care, prosthetic cleaning, ply sock cleaning, correct ply sock adjustment, proper wear schedule/adjustment, and proper weight-bearing schedule/adjustment Prosthetic wear tolerance: wearing liner 3-4 hours 2x/day, 7 days/week Edema: nonpitting Residual limb condition: cylindrical shape, no open areas, invaginated scar posteriorly, normal temperature & color, slight dryness to skin, sensation intact. Prosthetic description: [facility] plans to deliver on Thursday, 4/27.  Prosthesis is silicon liner with velcro lanyard suspension, Single axis friction  engaging knee with extension assist, flexible keel foot K code/activity level with prosthetic ITG:PQDIY 2 basic community with fixed cadence   Today's Treatment: 09/27/2021 Therapeutic exercise: Hamstring stretch RLE strap DF supine 30 sec 2 reps Leg press RLE only 75# 20 reps with back 45* and 68# 20 reps with back flat  Neuromuscular Re-ed: Standing trunk ext eccentric leaning forward to lightly touch UE to chair bottom & concentric to upright 10 reps without UE support.  Stepping prosthesis forward, lateral & backward and stabilizing for 5 sec then return to bilateral stance. Pt performed with min guard & intermittent touch on bars.   Prosthetic Training with Transfemoral Prosthesis: Due to soreness in residual limb pt arrived with w/c again.   Pt ambulated 74' with RW using UEs to limit weight on prosthesis. No balance issues noted.  Attempted to amb in //bars with cane & left bar but limb was too painful.  PT reviewed need to adjust ply socks based on limb volume for proper depth into socket.  PT explained pump action of weight on & off prosthesis can decrease limb volume during day. Pt & wife verbalized understanding. PT recommended to continue to wear prosthesis most of awake hours and use RW for next few days until tenderness subsides. PT explained that large fluctuations like no wear to long days can increase limb pain. Also standing activiities without prosthesis increases his fall risk & stress to RLE & BUEs. Pt verbalized understanding.    09/25/2021 Therapeutic exercise: Hamstring stretch RLE strap DF supine 30 sec 2 reps Leg press RLE only 75# 20 reps with back 45* and 68# 20 reps with back flat  Neuromuscular Re-ed: Standing bilateral stance with feet 2-3" apart head turns with EO on foam with minA /tactile cues and intermittent touch on bars and static on foam with EC 10 sec 3 reps. Stepping prosthesis forward, lateral & backward and stabilizing for 5 sec then return to  bilateral stance. Pt performed with min guard & intermittent touch on bars.   Prosthetic Training with  Transfemoral Prosthesis: Pt ambulated with cane 49' with HHA & 110' without HHA both with minA. Sit to / from stand from chair without armrests using UEs with supervision stabilizing with cane. Pt ambulated 10' X 2 including 180* turns [place] with cane inside //bars with min guard & intermittent touch //bar with slow cautious pace.  PT demo & verbal cues on picking up item from floor. Pt performed 2 reps with minA. Pt amb 63' with cane with minA / no HHA carrying open bottle of water with verbal cues on upright posture & prosthetic knee stability with initial contact. Pt neg 4 steps with single rail (2 steps right rail & 2 steps left rail) and cane contralateral to rail with supervision / verbal cues on sequence.   09/20/2021 Suspension strap was loose with his limb having seated deeper into socket. PT went to tighten strap & D-ring strap broke. PT spoke to prosthetist who can repair it this morning.  Patient's wife took prosthesis to be repaired.  Therapeutic exercise: Nustep with RLE & BUEs level 6 for 8 min. Hamstring stretch RLE long sit strap DF knee ext with forward trunk flexion 30 sec 3 reps Hip flexor stretch PT manual assist start with bil knee to chest to decrease lordosis then single LE ext 30 sec hold 3 reps ea LE Leg press RLE only 75# 20 reps with back 45* and 68# 20 reps with back flat  Self-Care: Pt plans to go to beach with family over the summer.  PT educated on pool lift at hotels for easier access, beach w/c and using pool shoe or anti-slip sock to protect RLE.  Pt & wife verbalized understanding.  Prosthetic Training with Transfemoral Prosthesis: Pt donned prosthesis with minimal cues for rotation alignment. PT verbally reviewed adjusting ply socks. Pt & wife verbalized understanding.  Pt ambulated with cane 74' with HHA & 110' without HHA both with minA majority of  time with intermittent modA when prosthetic knee not extended at initial contact.      08/08/21 1106  PT Education  Education Details HEP at [employer]) Educated Patient;Child(ren)  Methods Explanation;Demonstration;Tactile cues;Verbal cues;Handout  Comprehension Returned demonstration;Tactile cues required;Verbalized understanding;Verbal cues required;Need further instruction   HEP for upright posture Standing with back to door frame reaching single & double UEs overhead hold 2 deep breathes 2 reps ea with target >5 times per day as only takes 1-2 minutes. Standing with back to counter with hands resting on counter for upright posture.  Goal is 5-10 minutes 2x/day. [place] supine dropping prosthesis over edge to stretch left hip flexors 30 sec hold 2-3 reps 2x/day.   ASSESSMENT:   CLINICAL IMPRESSION: Patient's residual limb was too sore today for gait with cane.  Pt appears to understand PT recommendations for continued wear most of awake hours and proper adjustment of ply socks.   OBJECTIVE IMPAIRMENTS Abnormal gait, cardiopulmonary status limiting activity, decreased activity tolerance, decreased balance, decreased endurance, decreased knowledge of use of DME, decreased mobility, decreased ROM, decreased strength, impaired flexibility, postural dysfunction, and prosthetic dependency .    ACTIVITY LIMITATIONS community activity, driving, and church.    PERSONAL FACTORS Age, Fitness, Time since onset of injury/illness/exacerbation, and 3+ comorbidities: see PMH  are also affecting patient's functional outcome.    REHAB POTENTIAL: Good   CLINICAL DECISION MAKING: Evolving/moderate complexity   EVALUATION COMPLEXITY: Moderate     GOALS: Goals reviewed with patient? Yes   SHORT TERM GOALS: Target date: 09/21/2021  Patient verbalizes proper adjustment of ply socks.  Goal status: MET 09/20/2021 2.  Patient tolerates prosthesis >90% of awake hours daily without skin issues  or limb pain <3/10 after standing. Goal status: partially met 6/21/203 as wearing prosthesis >90% awake hours with no skin issues but has limb pain up to 9/10 with standing.     3.  Patient able to pick up items from floor without UE support with supervision. Goal status NOT MET 09/18/21   4. Patient ambulates 100' with cane & prosthesis with modA / HHA.  Goal status: MET 6/19/23s    LONG TERM GOALS: Target date: 10/19/2021   Patient demonstrates & verbalized understanding of prosthetic care to enable safe utilization of prosthesis. Baseline: SEE OBJECTIVE DATA Goal status: INITIAL   Patient tolerates prosthesis wear >90% of awake hours without skin or limb pain issues. Baseline: SEE OBJECTIVE DATA Goal status: INITIAL   Berg Balance </= 36/56 to indicate lower fall risk Baseline: SEE OBJECTIVE DATA Goal status: INITIAL   Patient ambulates >300' with LRAD & prosthesis safely independently for community mobility. Baseline: SEE OBJECTIVE DATA Goal status: INITIAL   Patient negotiates ramps, curbs & stairs with single rail with LRAD & prosthesis independently for community mobility. Baseline: SEE OBJECTIVE DATA Goal status: INITIAL   PLAN: PT FREQUENCY: 2x/week   PT DURATION: 12 weeks   PLANNED INTERVENTIONS: Therapeutic exercises, Therapeutic activity, Neuromuscular re-education, Balance training, Gait training, Patient/Family education, Stair training, Vestibular training, Prosthetic training, DME instructions, and scar mobilization   PLAN FOR NEXT SESSION:  Do 10th visit note, continue work towards LTGs, balance and gait progressions as able.  Jamey Reas, PT, DPT 09/27/2021, 10:17 AM

## 2021-10-04 ENCOUNTER — Encounter: Payer: Medicare Other | Admitting: Physical Therapy

## 2021-10-06 ENCOUNTER — Encounter: Payer: Medicare Other | Admitting: Physical Therapy

## 2021-10-09 ENCOUNTER — Ambulatory Visit (INDEPENDENT_AMBULATORY_CARE_PROVIDER_SITE_OTHER): Payer: Medicare Other | Admitting: Physical Therapy

## 2021-10-09 ENCOUNTER — Encounter: Payer: Self-pay | Admitting: Physical Therapy

## 2021-10-09 DIAGNOSIS — R2689 Other abnormalities of gait and mobility: Secondary | ICD-10-CM

## 2021-10-09 DIAGNOSIS — R293 Abnormal posture: Secondary | ICD-10-CM

## 2021-10-09 DIAGNOSIS — M6281 Muscle weakness (generalized): Secondary | ICD-10-CM | POA: Diagnosis not present

## 2021-10-09 DIAGNOSIS — M25652 Stiffness of left hip, not elsewhere classified: Secondary | ICD-10-CM

## 2021-10-09 DIAGNOSIS — R2681 Unsteadiness on feet: Secondary | ICD-10-CM | POA: Diagnosis not present

## 2021-10-09 NOTE — Therapy (Signed)
OUTPATIENT PHYSICAL THERAPY PROSTHETIC TREATMENT NOTE & PROGRESS NOTE   Patient Name: Samuel Carney MRN: [id number] DOB:September 03, 1945, 76 y.o., male Today's Date: 10/09/2021  PCP: Juluis Pitch, MD REFERRING PROVIDER: Jamse Arn, MD  Progress Note Reporting Period 08/31/2021 to 10/09/2021  See note below for Objective Data and Assessment of Progress/Goals.       PT End of Session - 10/09/21 0930     Visit Number 20    Number of Visits 25    Date for PT Re-Evaluation 10/19/21    Authorization Type [employer] Medicare   $20 co-pay    Progress Note Due on Visit 20    PT Start Time 0929    PT Stop Time 1015    PT Time Calculation (min) 46 min    Equipment Utilized During Treatment Gait belt    Activity Tolerance Patient tolerated treatment well    Behavior During Therapy [facility] for tasks assessed/performed           Progress Note Reporting Period 08/31/2021 to 10/09/2021  See note below for Objective Data and Assessment of Progress/Goals.          Past Medical History:  Diagnosis Date   Alcohol abuse    Aortic atherosclerosis (HCC)    B12 deficiency    Cirrhosis (HCC)    COPD (chronic obstructive pulmonary disease) (HCC)    Coronary artery disease    GERD (gastroesophageal reflux disease)    Glaucoma    Grade II diastolic dysfunction    History of kidney stones    HLD (hyperlipidemia)    Hx of radiation therapy    Hypertension    Lung mass    Moderate mitral regurgitation    Pneumonia    PVD (peripheral vascular disease) (HCC)    Squamous cell carcinoma of lung, right ([place]) 2019   Past Surgical History:  Procedure Laterality Date   COLONOSCOPY WITH PROPOFOL     COLONOSCOPY WITH PROPOFOL N/A 11/25/2018   Procedure: COLONOSCOPY WITH PROPOFOL;  Surgeon: Lollie Sails, MD;  Location: [facility] ENDOSCOPY;  Service: Endoscopy;  Laterality: N/A;   ENDARTERECTOMY FEMORAL Left 07/13/2020   Procedure: ENDARTERECTOMY FEMORAL ( SFA STENT);  Surgeon: Katha Cabal, MD;  Location: [facility] ORS;  Service: Vascular;  Laterality: Left;   ESOPHAGOGASTRODUODENOSCOPY (EGD) WITH PROPOFOL N/A 11/25/2018   Procedure: ESOPHAGOGASTRODUODENOSCOPY (EGD) WITH PROPOFOL;  Surgeon: Lollie Sails, MD;  Location: [facility] ENDOSCOPY;  Service: Endoscopy;  Laterality: N/A;   LEG AMPUTATION ABOVE KNEE Left    LOWER EXTREMITY ANGIOGRAPHY Left 06/07/2020   Procedure: LOWER EXTREMITY ANGIOGRAPHY;  Surgeon: Katha Cabal, MD;  Location: [place] CV LAB;  Service: Cardiovascular;  Laterality: Left;   LOWER EXTREMITY ANGIOGRAPHY Left 01/10/2021   Procedure: LOWER EXTREMITY ANGIOGRAPHY;  Surgeon: Katha Cabal, MD;  Location: [place] CV LAB;  Service: Cardiovascular;  Laterality: Left;   Patient Active Problem List   Diagnosis Date Noted   Atherosclerosis of artery of extremity with ulceration ([place]) 07/13/2020   Atherosclerosis of native arteries of the extremities with ulceration ([place]) 66/09/3014   Alcoholic cirrhosis of liver without ascites ([place]) 04/09/2019   Hyperlipidemia, mixed 04/09/2019   Malignant 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 ([place]) 04/09/2019   Thrombocytopenia ([place]) 04/09/2019   CAD (coronary artery disease) 10/14/2017   Lung cancer ([place]) 10/14/2017   Nodule of upper lobe of right lung 09/15/2017   Tobacco abuse 08/14/2017   Benign essential hypertension 09/09/2013    REFERRING DIAG:  W10.932 S/P  AKA unilateral, left   I70.229 critical lower limg ischemia  THERAPY DIAG:  Unsteadiness on feet  Muscle weakness (generalized)  Other abnormalities of gait and mobility  Abnormal posture  Stiffness of left hip, not elsewhere classified  PERTINENT HISTORY: PAD, CAD, angina, malignant neoplasm of right lung, HLD, alcoholic cirrhosis of liver, HTN  PRECAUTIONS: Fall  SUBJECTIVE: He is reporting wrist and back pain in addition to the right leg. He wears the prosthesis 2x 3-4 hours daily.  PAIN:  Are you having pain?  NPRS scale:  6/10  when walking none when standing still or sitting.  Pain location: residual limb Pain description: ache, sore Aggravating factors: liner Relieving factors: removing liner.  Right hip 0/10   OBJECTIVE:    MUSCLE LENGTH: Hamstrings: 07/24/2021:  Right -19 deg; with hip 90* Thomas test: 07/24/2021:  Right -19 deg; Left -27 deg   POSTURE: 07/24/2021:  rounded shoulders, forward head, flexed trunk , and weight shift right   LE ROM:   Active ROM Right 07/24/21 Left 07/24/21  Hip flexion      Hip extension Standing -20* Standing  -30*  Hip abduction      Hip adduction      Hip internal rotation      Hip external rotation      Knee flexion      Knee extension      Ankle dorsiflexion      Ankle plantarflexion      Ankle inversion      Ankle eversion       (Blank rows = not tested)   MMT:   MMT Right 07/24/21 Left 07/24/21  Hip flexion 5/5 4/4  Hip extension 3/5 3-/5  Hip abduction 3/5 3-/5  Hip adduction      Hip internal rotation      Hip external rotation      Knee flexion 3/5 NA  Knee extension 4/5 NA  Ankle dorsiflexion 5/5 NA  Ankle plantarflexion   NA  Ankle inversion   NA  Ankle eversion   NA  (Blank rows = not tested)   TRANSFERS: 07/24/2021:  Sit to stand: Modified independence and requires armrests & use of BUEs 07/24/2021:  Stand to sit: Modified independence and requires armrests & use of BUEs   GAIT: 07/24/2021:  Gait pattern: hopping with good clearance RLE Distance walked: 30' Assistive device utilized: Environmental consultant - 2 wheeled Level of assistance: Modified independence Gait velocity:  Comments:    FUNCTIONAL TESTs:  Merrilee Jansky Balance Scale:  07/31/2021  11/56  [facility] PT Assessment - 07/31/21 0815                Standardized Balance Assessment    Standardized Balance Assessment Berg Balance Test          Berg Balance Test    Sit to Stand Needs minimal aid to stand or to stabilize     Standing Unsupported Needs several tries to stand 30 seconds unsupported      Sitting with Back Unsupported but Feet Supported on Floor or Stool Able to sit safely and securely 2 minutes     Stand to Sit Uses backs of legs against chair to control descent     Transfers Able to transfer safely, definite need of hands     Standing Unsupported with Eyes Closed Needs help to keep from falling     Standing Unsupported with Feet Together Needs help to attain position and unable to hold for 15 seconds  From Standing, Reach Forward with Outstretched Arm Loses balance while trying/requires external support     From Standing Position, Pick up Object from Floor Unable to try/needs assist to keep balance     From Standing Position, Turn to Look Behind Over each Shoulder Needs assist to keep from losing balance and falling     Turn 360 Degrees Needs assistance while turning     Standing Unsupported, Alternately Place Feet on Step/Stool Needs assistance to keep from falling or unable to try     Standing Unsupported, One Foot in [employer] balance while stepping or standing     Standing on One Leg Unable to try or needs assist to prevent fall     Total Score 11     Berg comment: BERG  < 36 high risk for falls (close to 100%) 46-51 moderate (>50%)   37-45 significant (>80%) 52-55 lower (> 25%)        CARDIOVASCULAR RESPONSE: 07/24/2021:  Functional activity: gait hopping with RW Pre-activity vitals: HR: 54 SpO2: 99 Post-activity vitals: HR: 57 SpO2: 99 Modified Borg scale for dyspnea: 1: very mild shortness of breath   CURRENT PROSTHETIC WEAR ASSESSMENT: 07/24/2021:  Patient is dependent with: skin check, residual limb care, prosthetic cleaning, ply sock cleaning, correct ply sock adjustment, proper wear schedule/adjustment, and proper weight-bearing schedule/adjustment Prosthetic wear tolerance: wearing liner 3-4 hours 2x/day, 7 days/week Edema: nonpitting Residual limb condition: cylindrical shape, no open areas, invaginated scar posteriorly, normal temperature &  color, slight dryness to skin, sensation intact. Prosthetic description: [facility] plans to deliver on Thursday, 4/27.  Prosthesis is silicon liner with velcro lanyard suspension, Single axis friction engaging knee with extension assist, flexible keel foot K code/activity level with prosthetic FOY:DXAJO 2 basic community with fixed cadence   Today's Treatment: 10/09/2021: Prosthetic Training with Transfemoral Prosthesis: PT discussed prosthetic wear time recommendations for most of waking hours with patient, pt verbalized understanding. PT recommended use of antiperspirant on residual limb for sweating and vaseline only with dry skin. Pt verbalized understanding.  Pt ambulated 30' & 120' with cane & HHA with verbal & tactile cues for upright posture & wt shift over prosthesis in stance. Pt amb inside //bars with intermittent touch with cane 8' turning 180* for 6 reps without rest. Cues for above. PT demo & verbal cues on negotiating stairs that do not have a rail available using HHA & cane. Pt neg 3 steps with minA HHA using cane with additional verbal cues. Pt & wife verbalized general understanding.  Pt reported able to neg stairs at dtr's and a friend's house with single rail & cane.    Neuromuscular Re-ed: Standing hip width apart on floor head turns 4 directions 5 reps EO with contact assist then 5 reps EC minA and on foam EO 5 reps ea with minA.  Intermittent touch on //bars.     09/27/2021 Therapeutic exercise: Hamstring stretch RLE strap DF supine 30 sec 2 reps Leg press RLE only 75# 20 reps with back 45* and 68# 20 reps with back flat  Neuromuscular Re-ed: Standing trunk ext eccentric leaning forward to lightly touch UE to chair bottom & concentric to upright 10 reps without UE support.  Stepping prosthesis forward, lateral & backward and stabilizing for 5 sec then return to bilateral stance. Pt performed with min guard & intermittent touch on bars.   Prosthetic Training with  Transfemoral Prosthesis: Due to soreness in residual limb pt arrived with w/c again.   Pt ambulated  27' with RW using UEs to limit weight on prosthesis. No balance issues noted.  Attempted to amb in //bars with cane & left bar but limb was too painful.  PT reviewed need to adjust ply socks based on limb volume for proper depth into socket.  PT explained pump action of weight on & off prosthesis can decrease limb volume during day. Pt & wife verbalized understanding. PT recommended to continue to wear prosthesis most of awake hours and use RW for next few days until tenderness subsides. PT explained that large fluctuations like no wear to long days can increase limb pain. Also standing activiities without prosthesis increases his fall risk & stress to RLE & BUEs. Pt verbalized understanding.    09/25/2021 Therapeutic exercise: Hamstring stretch RLE strap DF supine 30 sec 2 reps Leg press RLE only 75# 20 reps with back 45* and 68# 20 reps with back flat  Neuromuscular Re-ed: Standing bilateral stance with feet 2-3" apart head turns with EO on foam with minA /tactile cues and intermittent touch on bars and static on foam with EC 10 sec 3 reps. Stepping prosthesis forward, lateral & backward and stabilizing for 5 sec then return to bilateral stance. Pt performed with min guard & intermittent touch on bars.   Prosthetic Training with Transfemoral Prosthesis: Pt ambulated with cane 19' with HHA & 110' without HHA both with minA. Sit to / from stand from chair without armrests using UEs with supervision stabilizing with cane. Pt ambulated 10' X 2 including 180* turns [place] with cane inside //bars with min guard & intermittent touch //bar with slow cautious pace.  PT demo & verbal cues on picking up item from floor. Pt performed 2 reps with minA. Pt amb 10' with cane with minA / no HHA carrying open bottle of water with verbal cues on upright posture & prosthetic knee stability with initial  contact. Pt neg 4 steps with single rail (2 steps right rail & 2 steps left rail) and cane contralateral to rail with supervision / verbal cues on sequence.      08/08/21 1106  PT Education  Education Details HEP at [employer]) Educated Patient;Child(ren)  Methods Explanation;Demonstration;Tactile cues;Verbal cues;Handout  Comprehension Returned demonstration;Tactile cues required;Verbalized understanding;Verbal cues required;Need further instruction   HEP for upright posture Standing with back to door frame reaching single & double UEs overhead hold 2 deep breathes 2 reps ea with target >5 times per day as only takes 1-2 minutes. Standing with back to counter with hands resting on counter for upright posture.  Goal is 5-10 minutes 2x/day. Hooklying supine dropping prosthesis over edge to stretch left hip flexors 30 sec hold 2-3 reps 2x/day.   ASSESSMENT:   CLINICAL IMPRESSION: Patient verbalizes understanding for rationale for wearing prosthesis most of awake hours.  Pt. has attended 10 visits since last progress note.  See objective data for updated information.  Pt. has made gains towards improving his mobility with prosthesis for community accessibility without w/c.  Pt. may continue to benefit from skilled PT services to continue progression towards reaching established goals and reduced risk of falls due to presentation.  OBJECTIVE IMPAIRMENTS Abnormal gait, cardiopulmonary status limiting activity, decreased activity tolerance, decreased balance, decreased endurance, decreased knowledge of use of DME, decreased mobility, decreased ROM, decreased strength, impaired flexibility, postural dysfunction, and prosthetic dependency .    ACTIVITY LIMITATIONS community activity, driving, and church.    PERSONAL FACTORS Age, Fitness, Time since onset of injury/illness/exacerbation, and 3+  comorbidities: see PMH  are also affecting patient's functional outcome.    REHAB POTENTIAL: Good    CLINICAL DECISION MAKING: Evolving/moderate complexity   EVALUATION COMPLEXITY: Moderate     GOALS: Goals reviewed with patient? Yes   SHORT TERM GOALS: Target date: 09/21/2021   Patient verbalizes proper adjustment of ply socks.  Goal status: MET 09/20/2021 2.  Patient tolerates prosthesis >90% of awake hours daily without skin issues or limb pain <3/10 after standing. Goal status: partially met 6/21/203 as wearing prosthesis >90% awake hours with no skin issues but has limb pain up to 9/10 with standing.     3.  Patient able to pick up items from floor without UE support with supervision. Goal status NOT MET 09/18/21   4. Patient ambulates 100' with cane & prosthesis with modA / HHA.  Goal status: MET 6/19/23s    LONG TERM GOALS: Target date: 10/19/2021   Patient demonstrates & verbalized understanding of prosthetic care to enable safe utilization of prosthesis. Baseline: SEE OBJECTIVE DATA Goal status: Ongoing 10/04/2021   Patient tolerates prosthesis wear >90% of awake hours without skin or limb pain issues. Baseline: SEE OBJECTIVE DATA Goal status: Ongoing 10/04/2021   Berg Balance </= 36/56 to indicate lower fall risk Baseline: SEE OBJECTIVE DATA Goal status: Ongoing 10/04/2021   Patient ambulates >300' with LRAD & prosthesis safely independently for community mobility. Baseline: SEE OBJECTIVE DATA Goal status: Ongoing 10/04/2021   Patient negotiates ramps, curbs & stairs with single rail with LRAD & prosthesis independently for community mobility. Baseline: SEE OBJECTIVE DATA Goal status: Ongoing 10/04/2021   PLAN: PT FREQUENCY: 2x/week   PT DURATION: 12 weeks   PLANNED INTERVENTIONS: Therapeutic exercises, Therapeutic activity, Neuromuscular re-education, Balance training, Gait training, Patient/Family education, Stair training, Vestibular training, Prosthetic training, DME instructions, and scar mobilization   PLAN FOR NEXT SESSION:  continue work towards LTGs, balance  and gait progressions as able.  Jamey Reas, PT, DPT 10/09/2021, 12:56 PM

## 2021-10-11 ENCOUNTER — Encounter: Payer: Self-pay | Admitting: Physical Therapy

## 2021-10-11 ENCOUNTER — Ambulatory Visit (INDEPENDENT_AMBULATORY_CARE_PROVIDER_SITE_OTHER): Payer: Medicare Other | Admitting: Physical Therapy

## 2021-10-11 DIAGNOSIS — R2689 Other abnormalities of gait and mobility: Secondary | ICD-10-CM

## 2021-10-11 DIAGNOSIS — M6281 Muscle weakness (generalized): Secondary | ICD-10-CM | POA: Diagnosis not present

## 2021-10-11 DIAGNOSIS — M25652 Stiffness of left hip, not elsewhere classified: Secondary | ICD-10-CM

## 2021-10-11 DIAGNOSIS — R293 Abnormal posture: Secondary | ICD-10-CM

## 2021-10-11 DIAGNOSIS — R2681 Unsteadiness on feet: Secondary | ICD-10-CM

## 2021-10-11 NOTE — Therapy (Signed)
OUTPATIENT PHYSICAL THERAPY PROSTHETIC TREATMENT NOTE   Patient Name: Samuel Carney MRN: [id number] DOB:1946/01/09, 76 y.o., male Today's Date: 10/11/2021  PCP: Juluis Pitch, MD REFERRING PROVIDER: Jamse Arn, MD      PT End of Session - 10/11/21 0929     Visit Number 21    Number of Visits 25    Date for PT Re-Evaluation 10/19/21    Authorization Type [employer] Medicare   $20 co-pay    Progress Note Due on Visit 20    PT Start Time 0930    PT Stop Time 1009    PT Time Calculation (min) 39 min    Equipment Utilized During Treatment Gait belt    Activity Tolerance Patient tolerated treatment well    Behavior During Therapy WFL for tasks assessed/performed                      Past Medical History:  Diagnosis Date   Alcohol abuse    Aortic atherosclerosis ([place])    B12 deficiency    Cirrhosis ([place])    COPD (chronic obstructive pulmonary disease) ([place])    Coronary artery disease    GERD (gastroesophageal reflux disease)    Glaucoma    Grade II diastolic dysfunction    History of kidney stones    HLD (hyperlipidemia)    Hx of radiation therapy    Hypertension    Lung mass    Moderate mitral regurgitation    Pneumonia    PVD (peripheral vascular disease) ([place])    Squamous cell carcinoma of lung, right ([place]) 2019   Past Surgical History:  Procedure Laterality Date   COLONOSCOPY WITH PROPOFOL     COLONOSCOPY WITH PROPOFOL N/A 11/25/2018   Procedure: COLONOSCOPY WITH PROPOFOL;  Surgeon: Lollie Sails, MD;  Location: [facility] ENDOSCOPY;  Service: Endoscopy;  Laterality: N/A;   ENDARTERECTOMY FEMORAL Left 07/13/2020   Procedure: ENDARTERECTOMY FEMORAL ( SFA STENT);  Surgeon: Katha Cabal, MD;  Location: [facility] ORS;  Service: Vascular;  Laterality: Left;   ESOPHAGOGASTRODUODENOSCOPY (EGD) WITH PROPOFOL N/A 11/25/2018   Procedure: ESOPHAGOGASTRODUODENOSCOPY (EGD) WITH PROPOFOL;  Surgeon: Lollie Sails, MD;  Location: [facility] ENDOSCOPY;  Service:  Endoscopy;  Laterality: N/A;   LEG AMPUTATION ABOVE KNEE Left    LOWER EXTREMITY ANGIOGRAPHY Left 06/07/2020   Procedure: LOWER EXTREMITY ANGIOGRAPHY;  Surgeon: Katha Cabal, MD;  Location: [place] CV LAB;  Service: Cardiovascular;  Laterality: Left;   LOWER EXTREMITY ANGIOGRAPHY Left 01/10/2021   Procedure: LOWER EXTREMITY ANGIOGRAPHY;  Surgeon: Katha Cabal, MD;  Location: [place] CV LAB;  Service: Cardiovascular;  Laterality: Left;   Patient Active Problem List   Diagnosis Date Noted   Atherosclerosis of artery of extremity with ulceration ([place]) 07/13/2020   Atherosclerosis of native arteries of the extremities with ulceration ([place]) 42/59/5638   Alcoholic cirrhosis of liver without ascites ([place]) 04/09/2019   Hyperlipidemia, mixed 04/09/2019   Malignant 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 ([place]) 04/09/2019   Thrombocytopenia ([place]) 04/09/2019   CAD (coronary artery disease) 10/14/2017   Lung cancer ([place]) 10/14/2017   Nodule of upper lobe of right lung 09/15/2017   Tobacco abuse 08/14/2017   Benign essential hypertension 09/09/2013    REFERRING DIAG:  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  The doctor does not think that his wrist is gout.   PAIN:  Are you having pain?  NPRS scale: 1-2/10 when walking none when standing still or sitting.  Pain location: residual limb Pain description: ache, sore Aggravating factors: liner Relieving factors: removing liner.  Right wrist 1/10   OBJECTIVE:    MUSCLE LENGTH: Hamstrings: 07/24/2021:  Right -19 deg; with hip  90* Thomas test: 07/24/2021:  Right -19 deg; Left -27 deg   POSTURE: 07/24/2021:  rounded shoulders, forward head, flexed trunk , and weight shift right   LE ROM:   Active ROM Right 07/24/21 Left 07/24/21  Hip flexion      Hip extension Standing -20* Standing  -30*  Hip abduction      Hip adduction      Hip internal rotation      Hip external rotation      Knee flexion      Knee extension      Ankle dorsiflexion      Ankle plantarflexion      Ankle inversion      Ankle eversion       (Blank rows = not tested)   MMT:   MMT Right 07/24/21 Left 07/24/21  Hip flexion 5/5 4/4  Hip extension 3/5 3-/5  Hip abduction 3/5 3-/5  Hip adduction      Hip internal rotation      Hip external rotation      Knee flexion 3/5 NA  Knee extension 4/5 NA  Ankle dorsiflexion 5/5 NA  Ankle plantarflexion   NA  Ankle inversion   NA  Ankle eversion   NA  (Blank rows = not tested)   TRANSFERS: 07/24/2021:  Sit to stand: Modified independence and requires armrests & use of BUEs 07/24/2021:  Stand to sit: Modified independence and requires armrests & use of BUEs   GAIT: 07/24/2021:  Gait pattern: hopping with good clearance RLE Distance walked: 30' Assistive device utilized: Environmental consultant - 2 wheeled Level of assistance: Modified independence Gait velocity:  Comments:    FUNCTIONAL TESTs:  Merrilee Jansky Balance Scale:  07/31/2021  11/56  [facility] PT Assessment - 07/31/21 0815                Standardized Balance Assessment    Standardized Balance Assessment Berg Balance Test          Berg Balance Test    Sit to Stand Needs minimal aid to stand or to stabilize     Standing Unsupported Needs several tries to stand 30 seconds unsupported     Sitting with Back Unsupported but Feet Supported on Floor or Stool Able to sit safely and securely 2 minutes     Stand to Sit Uses backs of legs against chair to control descent     Transfers Able to transfer safely, definite need of hands     Standing Unsupported with  Eyes Closed Needs help to keep from falling     Standing Unsupported with Feet Together Needs help to attain position and unable to hold for 15 seconds     From Standing, Reach Forward with Outstretched Arm Loses balance while trying/requires external support     From Standing Position, Pick up Object from Floor Unable to try/needs assist to keep balance     From Standing Position, Turn to  Look Behind Over each Shoulder Needs assist to keep from losing balance and falling     Turn 360 Degrees Needs assistance while turning     Standing Unsupported, Alternately Place Feet on Step/Stool Needs assistance to keep from falling or unable to try     Standing Unsupported, One Foot in [employer] balance while stepping or standing     Standing on One Leg Unable to try or needs assist to prevent fall     Total Score 11     Berg comment: BERG  < 36 high risk for falls (close to 100%) 46-51 moderate (>50%)   37-45 significant (>80%) 52-55 lower (> 25%)        CARDIOVASCULAR RESPONSE: 07/24/2021:  Functional activity: gait hopping with RW Pre-activity vitals: HR: 54 SpO2: 99 Post-activity vitals: HR: 57 SpO2: 99 Modified Borg scale for dyspnea: 1: very mild shortness of breath   CURRENT PROSTHETIC WEAR ASSESSMENT: 07/24/2021:  Patient is dependent with: skin check, residual limb care, prosthetic cleaning, ply sock cleaning, correct ply sock adjustment, proper wear schedule/adjustment, and proper weight-bearing schedule/adjustment Prosthetic wear tolerance: wearing liner 3-4 hours 2x/day, 7 days/week Edema: nonpitting Residual limb condition: cylindrical shape, no open areas, invaginated scar posteriorly, normal temperature & color, slight dryness to skin, sensation intact. Prosthetic description: [facility] plans to deliver on Thursday, 4/27.  Prosthesis is silicon liner with velcro lanyard suspension, Single axis friction engaging knee with extension assist, flexible keel foot K code/activity  level with prosthetic QJJ:HERDE 2 basic community with fixed cadence   Today's Treatment: 10/11/2021 Prosthetic Training with Transfemoral Prosthesis: PT explained , residual limb pain, phantom sensation and phantom pain and demonstrated tapping technique over prosthesis to alleviate pain when wearing the prosthesis and rubbing residual limb when prosthesis off, pt verbalized understanding Pt ambulated 80' x 2 reps with cane and HHA with cues for upright posture, wt shift over prosthesis in stance & step length.  Pt sit to / from stand x 3 reps chair without armrests using UE support safely  Neuromuscular Re-ed: Pt standing trunk extension x 10 reps reaching to chair seat with controlled concentric & eccentric motion with supervision.  Pt 180* turn in //bars with no UE support and CGA assist, x 1 rep each direction Pt standing balance step forward and laterally alternating each leg 5 reps x 5 sec hold, no UE support stepping prosthesis and cane stepping sound limb with prosthetic stance with tactile, verbal, and visual cueing with mirror. PT demo and explained picking up item from floor, pt returned demo x 3 reps with supervision without UE support.  Pt ambulated 8' in //bars with cane forward and backward x2  reps with supervision and verbal and tactile cueing for foot placement and hip hiking with backwards walking, side stepping x2 reps with supervision and verbal cueing for weight bearing and foot placement.   10/09/2021: Prosthetic Training with Transfemoral Prosthesis: PT discussed prosthetic wear time recommendations for most of waking hours with patient, pt verbalized understanding. PT recommended use of antiperspirant on residual limb for sweating and vaseline only with dry skin. Pt verbalized understanding.  Pt ambulated 28' & 120' with cane & HHA with verbal & tactile cues for upright posture & wt shift over prosthesis in stance. Pt amb inside //bars with intermittent touch with cane 8'  turning 180* for 6 reps without rest. Cues for above. PT demo & verbal cues on negotiating stairs that do not have a rail available using HHA & cane. Pt  neg 3 steps with minA HHA using cane with additional verbal cues. Pt & wife verbalized general understanding.  Pt reported able to neg stairs at dtr's and a friend's house with single rail & cane.    Neuromuscular Re-ed: Standing hip width apart on floor head turns 4 directions 5 reps EO with contact assist then 5 reps EC minA and on foam EO 5 reps ea with minA.  Intermittent touch on //bars.     09/27/2021 Therapeutic exercise: Hamstring stretch RLE strap DF supine 30 sec 2 reps Leg press RLE only 75# 20 reps with back 45* and 68# 20 reps with back flat  Neuromuscular Re-ed: Standing trunk ext eccentric leaning forward to lightly touch UE to chair bottom & concentric to upright 10 reps without UE support.  Stepping prosthesis forward, lateral & backward and stabilizing for 5 sec then return to bilateral stance. Pt performed with min guard & intermittent touch on bars.   Prosthetic Training with Transfemoral Prosthesis: Due to soreness in residual limb pt arrived with w/c again.   Pt ambulated 47' with RW using UEs to limit weight on prosthesis. No balance issues noted.  Attempted to amb in //bars with cane & left bar but limb was too painful.  PT reviewed need to adjust ply socks based on limb volume for proper depth into socket.  PT explained pump action of weight on & off prosthesis can decrease limb volume during day. Pt & wife verbalized understanding. PT recommended to continue to wear prosthesis most of awake hours and use RW for next few days until tenderness subsides. PT explained that large fluctuations like no wear to long days can increase limb pain. Also standing activiities without prosthesis increases his fall risk & stress to RLE & BUEs. Pt verbalized understanding.         08/08/21 1106  PT Education  Education Details  HEP at [employer]) Educated Patient;Child(ren)  Methods Explanation;Demonstration;Tactile cues;Verbal cues;Handout  Comprehension Returned demonstration;Tactile cues required;Verbalized understanding;Verbal cues required;Need further instruction   HEP for upright posture Standing with back to door frame reaching single & double UEs overhead hold 2 deep breathes 2 reps ea with target >5 times per day as only takes 1-2 minutes. Standing with back to counter with hands resting on counter for upright posture.  Goal is 5-10 minutes 2x/day. Hooklying supine dropping prosthesis over edge to stretch left hip flexors 30 sec hold 2-3 reps 2x/day.   ASSESSMENT:   CLINICAL IMPRESSION: Patient improved his balance with neuromuscular re-education activities with skilled PT instruction & practice with repetition.  He continues to benefit from skilled PT.   OBJECTIVE IMPAIRMENTS Abnormal gait, cardiopulmonary status limiting activity, decreased activity tolerance, decreased balance, decreased endurance, decreased knowledge of use of DME, decreased mobility, decreased ROM, decreased strength, impaired flexibility, postural dysfunction, and prosthetic dependency .    ACTIVITY LIMITATIONS community activity, driving, and church.    PERSONAL FACTORS Age, Fitness, Time since onset of injury/illness/exacerbation, and 3+ comorbidities: see PMH  are also affecting patient's functional outcome.    REHAB POTENTIAL: Good   CLINICAL DECISION MAKING: Evolving/moderate complexity   EVALUATION COMPLEXITY: Moderate     GOALS: Goals reviewed with patient? Yes   SHORT TERM GOALS: Target date: 09/21/2021   Patient verbalizes proper adjustment of ply socks.  Goal status: MET 09/20/2021 2.  Patient tolerates prosthesis >90% of awake hours daily without skin issues or limb pain <3/10 after standing. Goal status: partially met 6/21/203 as wearing prosthesis >90% awake  hours with no skin issues but has limb pain up to  9/10 with standing.     3.  Patient able to pick up items from floor without UE support with supervision. Goal status NOT MET 09/18/21   4. Patient ambulates 100' with cane & prosthesis with modA / HHA.  Goal status: MET 6/19/23s    LONG TERM GOALS: Target date: 10/19/2021   Patient demonstrates & verbalized understanding of prosthetic care to enable safe utilization of prosthesis. Baseline: SEE OBJECTIVE DATA Goal status: Ongoing 10/04/2021   Patient tolerates prosthesis wear >90% of awake hours without skin or limb pain issues. Baseline: SEE OBJECTIVE DATA Goal status: Ongoing 10/04/2021   Berg Balance </= 36/56 to indicate lower fall risk Baseline: SEE OBJECTIVE DATA Goal status: Ongoing 10/04/2021   Patient ambulates >300' with LRAD & prosthesis safely independently for community mobility. Baseline: SEE OBJECTIVE DATA Goal status: Ongoing 10/04/2021   Patient negotiates ramps, curbs & stairs with single rail with LRAD & prosthesis independently for community mobility. Baseline: SEE OBJECTIVE DATA Goal status: Ongoing 10/04/2021   PLAN: PT FREQUENCY: 2x/week   PT DURATION: 12 weeks   PLANNED INTERVENTIONS: Therapeutic exercises, Therapeutic activity, Neuromuscular re-education, Balance training, Gait training, Patient/Family education, Stair training, Vestibular training, Prosthetic training, DME instructions, and scar mobilization   PLAN FOR NEXT SESSION:  begin to check LTGs with discussion d/c vs recert, balance and gait progressions as able.  Jamey Reas, PT, DPT 10/11/2021, 10:51 AM

## 2021-10-16 ENCOUNTER — Ambulatory Visit (INDEPENDENT_AMBULATORY_CARE_PROVIDER_SITE_OTHER): Payer: Medicare Other | Admitting: Physical Therapy

## 2021-10-16 ENCOUNTER — Encounter: Payer: Self-pay | Admitting: Physical Therapy

## 2021-10-16 DIAGNOSIS — M25652 Stiffness of left hip, not elsewhere classified: Secondary | ICD-10-CM

## 2021-10-16 DIAGNOSIS — R293 Abnormal posture: Secondary | ICD-10-CM | POA: Diagnosis not present

## 2021-10-16 DIAGNOSIS — R2689 Other abnormalities of gait and mobility: Secondary | ICD-10-CM

## 2021-10-16 DIAGNOSIS — R2681 Unsteadiness on feet: Secondary | ICD-10-CM | POA: Diagnosis not present

## 2021-10-16 DIAGNOSIS — M6281 Muscle weakness (generalized): Secondary | ICD-10-CM

## 2021-10-16 NOTE — Therapy (Signed)
OUTPATIENT PHYSICAL THERAPY PROSTHETIC TREATMENT NOTE   Patient Name: Samuel Carney MRN: [id number] DOB:09/29/1945, 76 y.o., male Today's Date: 10/16/2021  PCP: Juluis Pitch, MD REFERRING PROVIDER: Jamse Arn, MD      PT End of Session - 10/16/21 0933     Visit Number 22    Number of Visits 25    Date for PT Re-Evaluation 10/19/21    Authorization Type [employer] Medicare   $20 co-pay    Progress Note Due on Visit 25    PT Start Time 0931    PT Stop Time 1013    PT Time Calculation (min) 42 min    Equipment Utilized During Treatment Gait belt    Activity Tolerance Patient tolerated treatment well    Behavior During Therapy WFL for tasks assessed/performed                       Past Medical History:  Diagnosis Date   Alcohol abuse    Aortic atherosclerosis ([place])    B12 deficiency    Cirrhosis ([place])    COPD (chronic obstructive pulmonary disease) ([place])    Coronary artery disease    GERD (gastroesophageal reflux disease)    Glaucoma    Grade II diastolic dysfunction    History of kidney stones    HLD (hyperlipidemia)    Hx of radiation therapy    Hypertension    Lung mass    Moderate mitral regurgitation    Pneumonia    PVD (peripheral vascular disease) ([place])    Squamous cell carcinoma of lung, right ([place]) 2019   Past Surgical History:  Procedure Laterality Date   COLONOSCOPY WITH PROPOFOL     COLONOSCOPY WITH PROPOFOL N/A 11/25/2018   Procedure: COLONOSCOPY WITH PROPOFOL;  Surgeon: Lollie Sails, MD;  Location: [facility] ENDOSCOPY;  Service: Endoscopy;  Laterality: N/A;   ENDARTERECTOMY FEMORAL Left 07/13/2020   Procedure: ENDARTERECTOMY FEMORAL ( SFA STENT);  Surgeon: Katha Cabal, MD;  Location: [facility] ORS;  Service: Vascular;  Laterality: Left;   ESOPHAGOGASTRODUODENOSCOPY (EGD) WITH PROPOFOL N/A 11/25/2018   Procedure: ESOPHAGOGASTRODUODENOSCOPY (EGD) WITH PROPOFOL;  Surgeon: Lollie Sails, MD;  Location: [facility] ENDOSCOPY;  Service:  Endoscopy;  Laterality: N/A;   LEG AMPUTATION ABOVE KNEE Left    LOWER EXTREMITY ANGIOGRAPHY Left 06/07/2020   Procedure: LOWER EXTREMITY ANGIOGRAPHY;  Surgeon: Katha Cabal, MD;  Location: [place] [facility];  Service: Cardiovascular;  Laterality: Left;   LOWER EXTREMITY ANGIOGRAPHY Left 01/10/2021   Procedure: LOWER EXTREMITY ANGIOGRAPHY;  Surgeon: Katha Cabal, MD;  Location: [place] [facility];  Service: Cardiovascular;  Laterality: Left;   Patient Active Problem List   Diagnosis Date Noted   Atherosclerosis of artery of extremity with ulceration ([place]) 07/13/2020   Atherosclerosis of native arteries of the extremities with ulceration ([place]) 10/93/2355   Alcoholic cirrhosis of liver without ascites ([place]) 04/09/2019   Hyperlipidemia, mixed 04/09/2019   Malignant 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 ([place]) 04/09/2019   Thrombocytopenia ([place]) 04/09/2019   CAD (coronary artery disease) 10/14/2017   Lung cancer ([place]) 10/14/2017   Nodule of upper lobe of right lung 09/15/2017   Tobacco abuse 08/14/2017   Benign essential hypertension 09/09/2013    REFERRING DIAG:  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  He took a few steps without device in kitchen  PAIN:  Are you having pain?  NPRS scale:  0/10 when walking none when standing still or sitting.  Pain location: residual limb Pain description: ache, sore Aggravating factors: liner Relieving factors: removing liner.  Right wrist 1/10   OBJECTIVE:    MUSCLE LENGTH: Hamstrings: 07/24/2021:  Right -19 deg; with hip 90* Thomas  test: 07/24/2021:  Right -19 deg; Left -27 deg   POSTURE: 07/24/2021:  rounded shoulders, forward head, flexed trunk , and weight shift right   LE ROM:   Active ROM Right 07/24/21 Left 07/24/21  Hip flexion      Hip extension Standing -20* Standing  -30*  Hip abduction      Hip adduction      Hip internal rotation      Hip external rotation      Knee flexion      Knee extension      Ankle dorsiflexion      Ankle plantarflexion      Ankle inversion      Ankle eversion       (Blank rows = not tested)   MMT:   MMT Right 07/24/21 Left 07/24/21  Hip flexion 5/5 4/4  Hip extension 3/5 3-/5  Hip abduction 3/5 3-/5  Hip adduction      Hip internal rotation      Hip external rotation      Knee flexion 3/5 NA  Knee extension 4/5 NA  Ankle dorsiflexion 5/5 NA  Ankle plantarflexion   NA  Ankle inversion   NA  Ankle eversion   NA  (Blank rows = not tested)   TRANSFERS: 07/24/2021:  Sit to stand: Modified independence and requires armrests & use of BUEs 07/24/2021:  Stand to sit: Modified independence and requires armrests & use of BUEs   GAIT: 07/24/2021:  Gait pattern: hopping with good clearance RLE Distance walked: 30' Assistive device utilized: Environmental consultant - 2 wheeled Level of assistance: Modified independence Gait velocity:  Comments:    FUNCTIONAL TESTs:  10/16/2021  Merrilee Jansky Balance 30/56 10/16/2021 TUG with cane with supervision see below  [facility] PT Assessment - 10/16/21 0930       Standardized Balance Assessment   Standardized Balance Assessment Berg Balance Test;Timed Up and Go Test      Berg Balance Test   Sit to Stand Able to stand  independently using hands    Standing Unsupported Able to stand 2 minutes with supervision    Sitting with Back Unsupported but Feet Supported on Floor or Stool Able to sit safely and securely 2 minutes    Stand to Sit Controls descent by using hands    Transfers Able to transfer safely, definite need of hands    Standing Unsupported with  Eyes Closed Able to stand 3 seconds    Standing Unsupported with Feet Together Needs help to attain position but able to stand for 30 seconds with feet together    From Standing, Reach Forward with Outstretched Arm Can reach forward >12 cm safely (5")    From Standing Position, Pick up Object from Floor Unable to pick up shoe, but reaches 2-5 cm (1-2") from shoe and balances independently    From Standing Position, Turn to Look Behind Over each Shoulder Turn sideways  only but maintains balance    Turn 360 Degrees Needs assistance while turning    Standing Unsupported, Alternately Place Feet on Step/Stool Able to complete >2 steps/needs minimal assist    Standing Unsupported, One Foot in Front Able to take small step independently and hold 30 seconds    Standing on One Leg Tries to lift leg/unable to hold 3 seconds but remains standing independently    Total Score 30    Berg comment: on 07/31/21 was 11/56      Timed Up and Go Test   TUG Normal TUG;Manual TUG;Cognitive TUG   cane stand alone tip & TFA prosthesis   Normal TUG (seconds) 68.75   cane stand alone tip & TFA prosthesis   Manual TUG (seconds) 106.69    Cognitive TUG (seconds) 126.59   naming [employer] foods, stopped during turns           Berg Balance Scale:  07/31/2021  11/56 [facility] PT Assessment - 07/31/21 0815                Standardized Balance Assessment    Standardized Balance Assessment Berg Balance Test          Berg Balance Test    Sit to Stand Needs minimal aid to stand or to stabilize     Standing Unsupported Needs several tries to stand 30 seconds unsupported     Sitting with Back Unsupported but Feet Supported on Floor or Stool Able to sit safely and securely 2 minutes     Stand to Sit Uses backs of legs against chair to control descent     Transfers Able to transfer safely, definite need of hands     Standing Unsupported with Eyes Closed Needs help to keep from falling     Standing Unsupported with Feet Together Needs  help to attain position and unable to hold for 15 seconds     From Standing, Reach Forward with Outstretched Arm Loses balance while trying/requires external support     From Standing Position, Pick up Object from Floor Unable to try/needs assist to keep balance     From Standing Position, Turn to Look Behind Over each Shoulder Needs assist to keep from losing balance and falling     Turn 360 Degrees Needs assistance while turning     Standing Unsupported, Alternately Place Feet on Step/Stool Needs assistance to keep from falling or unable to try     Standing Unsupported, One Foot in [employer] balance while stepping or standing     Standing on One Leg Unable to try or needs assist to prevent fall     Total Score 11     Berg comment: BERG  < 36 high risk for falls (close to 100%) 46-51 moderate (>50%)   37-45 significant (>80%) 52-55 lower (> 25%)        CARDIOVASCULAR RESPONSE: 07/24/2021:  Functional activity: gait hopping with RW Pre-activity vitals: HR: 54 SpO2: 99 Post-activity vitals: HR: 57 SpO2: 99 Modified Borg scale for dyspnea: 1: very mild shortness of breath   CURRENT PROSTHETIC WEAR ASSESSMENT: 07/24/2021:  Patient is dependent with: skin check, residual limb care, prosthetic cleaning, ply sock cleaning, correct ply sock adjustment, proper wear schedule/adjustment, and proper weight-bearing schedule/adjustment Prosthetic wear tolerance: wearing liner 3-4 hours 2x/day, 7 days/week Edema: nonpitting Residual limb condition: cylindrical shape, no open areas, invaginated scar posteriorly, normal temperature & color, slight dryness to skin, sensation intact. Prosthetic description: [facility] plans to deliver on Thursday,  4/27.  Prosthesis is silicon liner with velcro lanyard suspension, Single axis friction engaging knee with extension assist, flexible keel foot K code/activity level with prosthetic TMH:[id number] 2 basic community with fixed cadence   Today's  Treatment: 10/16/2021 Pt verbalized proper prosthetic care including donning, cleaning & adjusting ply socks.  PT instructed in socket revision >/= 6 months from delivery of prosthesis (07/27/21). PT recommended upgrading suspension to suction ring.  He is wearing prosthesis most of awake hours without skin issues or limb pain.  See above objective for Berg & TUG.  10/11/2021 Prosthetic Training with Transfemoral Prosthesis: PT explained , residual limb pain, phantom sensation and phantom pain and demonstrated tapping technique over prosthesis to alleviate pain when wearing the prosthesis and rubbing residual limb when prosthesis off, pt verbalized understanding Pt ambulated 80' x 2 reps with cane and HHA with cues for upright posture, wt shift over prosthesis in stance & step length.  Pt sit to / from stand x 3 reps chair without armrests using UE support safely  Neuromuscular Re-ed: Pt standing trunk extension x 10 reps reaching to chair seat with controlled concentric & eccentric motion with supervision.  Pt 180* turn in //bars with no UE support and CGA assist, x 1 rep each direction Pt standing balance step forward and laterally alternating each leg 5 reps x 5 sec hold, no UE support stepping prosthesis and cane stepping sound limb with prosthetic stance with tactile, verbal, and visual cueing with mirror. PT demo and explained picking up item from floor, pt returned demo x 3 reps with supervision without UE support.  Pt ambulated 8' in //bars with cane forward and backward x2  reps with supervision and verbal and tactile cueing for foot placement and hip hiking with backwards walking, side stepping x2 reps with supervision and verbal cueing for weight bearing and foot placement.   10/09/2021: Prosthetic Training with Transfemoral Prosthesis: PT discussed prosthetic wear time recommendations for most of waking hours with patient, pt verbalized understanding. PT recommended use of  antiperspirant on residual limb for sweating and vaseline only with dry skin. Pt verbalized understanding.  Pt ambulated 46' & 120' with cane & HHA with verbal & tactile cues for upright posture & wt shift over prosthesis in stance. Pt amb inside //bars with intermittent touch with cane 8' turning 180* for 6 reps without rest. Cues for above. PT demo & verbal cues on negotiating stairs that do not have a rail available using HHA & cane. Pt neg 3 steps with minA HHA using cane with additional verbal cues. Pt & wife verbalized general understanding.  Pt reported able to neg stairs at dtr's and a friend's house with single rail & cane.    Neuromuscular Re-ed: Standing hip width apart on floor head turns 4 directions 5 reps EO with contact assist then 5 reps EC minA and on foam EO 5 reps ea with minA.  Intermittent touch on //bars.        08/08/21 1106  PT Education  Education Details HEP at [employer]) Educated Patient;Child(ren)  Methods Explanation;Demonstration;Tactile cues;Verbal cues;Handout  Comprehension Returned demonstration;Tactile cues required;Verbalized understanding;Verbal cues required;Need further instruction   HEP for upright posture Standing with back to door frame reaching single & double UEs overhead hold 2 deep breathes 2 reps ea with target >5 times per day as only takes 1-2 minutes. Standing with back to counter with hands resting on counter for upright posture.  Goal is 5-10 minutes 2x/day. Hooklying supine dropping  prosthesis over edge to stretch left hip flexors 30 sec hold 2-3 reps 2x/day.   ASSESSMENT:   CLINICAL IMPRESSION: Patient met or partially met 3 of 5 LTGs with PT anticipation to meet other LTGs next session.  He reports satisfied with current level for now and will probably want to discharge PT until he gets new socket at end of October.   OBJECTIVE IMPAIRMENTS Abnormal gait, cardiopulmonary status limiting activity, decreased activity tolerance,  decreased balance, decreased endurance, decreased knowledge of use of DME, decreased mobility, decreased ROM, decreased strength, impaired flexibility, postural dysfunction, and prosthetic dependency .    ACTIVITY LIMITATIONS community activity, driving, and church.    PERSONAL FACTORS Age, Fitness, Time since onset of injury/illness/exacerbation, and 3+ comorbidities: see PMH  are also affecting patient's functional outcome.    REHAB POTENTIAL: Good   CLINICAL DECISION MAKING: Evolving/moderate complexity   EVALUATION COMPLEXITY: Moderate     GOALS: Goals reviewed with patient? Yes   SHORT TERM GOALS: Target date: 09/21/2021   Patient verbalizes proper adjustment of ply socks.  Goal status: MET 09/20/2021 2.  Patient tolerates prosthesis >90% of awake hours daily without skin issues or limb pain <3/10 after standing. Goal status: partially met 6/21/203 as wearing prosthesis >90% awake hours with no skin issues but has limb pain up to 9/10 with standing.     3.  Patient able to pick up items from floor without UE support with supervision. Goal status NOT MET 09/18/21   4. Patient ambulates 100' with cane & prosthesis with modA / HHA.  Goal status: MET 6/19/23s    LONG TERM GOALS: Target date: 10/19/2021   Patient demonstrates & verbalized understanding of prosthetic care to enable safe utilization of prosthesis. Baseline: SEE OBJECTIVE DATA Goal status: MET 10/16/2021   Patient tolerates prosthesis wear >90% of awake hours without skin or limb pain issues. Baseline: SEE OBJECTIVE DATA Goal status: MET 10/16/2021   Berg Balance </= 36/56 to indicate lower fall risk Baseline: SEE OBJECTIVE DATA Goal status: Partially MET 10/16/2021   Patient ambulates >300' with LRAD & prosthesis safely independently for community mobility. Baseline: SEE OBJECTIVE DATA Goal status: Ongoing 10/04/2021   Patient negotiates ramps, curbs & stairs with single rail with LRAD & prosthesis independently  for community mobility. Baseline: SEE OBJECTIVE DATA Goal status: Ongoing 10/04/2021   PLAN: PT FREQUENCY: 2x/week   PT DURATION: 12 weeks   PLANNED INTERVENTIONS: Therapeutic exercises, Therapeutic activity, Neuromuscular re-education, Balance training, Gait training, Patient/Family education, Stair training, Vestibular training, Prosthetic training, DME instructions, and scar mobilization   PLAN FOR NEXT SESSION:  check remaining LTGs and discharge PT.   Jamey Reas, PT, DPT 10/16/2021, 10:14 AM

## 2021-10-18 ENCOUNTER — Encounter: Payer: Medicare Other | Admitting: Physical Therapy

## 2021-10-19 ENCOUNTER — Encounter: Payer: Self-pay | Admitting: Physical Therapy

## 2021-10-19 ENCOUNTER — Ambulatory Visit: Payer: Medicare Other | Admitting: Physical Therapy

## 2021-10-19 DIAGNOSIS — R2689 Other abnormalities of gait and mobility: Secondary | ICD-10-CM | POA: Diagnosis not present

## 2021-10-19 DIAGNOSIS — R2681 Unsteadiness on feet: Secondary | ICD-10-CM

## 2021-10-19 DIAGNOSIS — R293 Abnormal posture: Secondary | ICD-10-CM

## 2021-10-19 DIAGNOSIS — M6281 Muscle weakness (generalized): Secondary | ICD-10-CM

## 2021-10-19 DIAGNOSIS — M25652 Stiffness of left hip, not elsewhere classified: Secondary | ICD-10-CM

## 2021-10-19 NOTE — Therapy (Signed)
OUTPATIENT PHYSICAL THERAPY PROSTHETIC TREATMENT NOTE & DISCHARGE SUMMARY   Patient Name: Samuel Carney MRN: [id number] DOB:November 05, 1945, 76 y.o., male Today's Date: 10/19/2021  PCP: Juluis Pitch, MD REFERRING PROVIDER: Jamse Arn, MD  PHYSICAL THERAPY DISCHARGE SUMMARY  Visits from Start of Care: 23  Current functional level related to goals / functional outcomes: See below   Remaining deficits: See below   Education / Equipment: Pt was educated on prosthetic care & HEP and appears to understand both.    Patient agrees to discharge. Patient goals were met. Patient is being discharged due to meeting the stated rehab goals.     PT End of Session - 10/19/21 0935     Visit Number 23    Number of Visits 25    Date for PT Re-Evaluation 10/19/21    Authorization Type [employer] Medicare   $20 co-pay    PT Start Time 0931    Equipment Utilized During Treatment Gait belt    Activity Tolerance Patient tolerated treatment well    Behavior During Therapy WFL for tasks assessed/performed                        Past Medical History:  Diagnosis Date   Alcohol abuse    Aortic atherosclerosis ([place])    B12 deficiency    Cirrhosis (HCC)    COPD (chronic obstructive pulmonary disease) (HCC)    Coronary artery disease    GERD (gastroesophageal reflux disease)    Glaucoma    Grade II diastolic dysfunction    History of kidney stones    HLD (hyperlipidemia)    Hx of radiation therapy    Hypertension    Lung mass    Moderate mitral regurgitation    Pneumonia    PVD (peripheral vascular disease) (HCC)    Squamous cell carcinoma of lung, right ([place]) 2019   Past Surgical History:  Procedure Laterality Date   COLONOSCOPY WITH PROPOFOL     COLONOSCOPY WITH PROPOFOL N/A 11/25/2018   Procedure: COLONOSCOPY WITH PROPOFOL;  Surgeon: Lollie Sails, MD;  Location: [facility] ENDOSCOPY;  Service: Endoscopy;  Laterality: N/A;   ENDARTERECTOMY FEMORAL Left 07/13/2020    Procedure: ENDARTERECTOMY FEMORAL ( SFA STENT);  Surgeon: Katha Cabal, MD;  Location: [facility] ORS;  Service: Vascular;  Laterality: Left;   ESOPHAGOGASTRODUODENOSCOPY (EGD) WITH PROPOFOL N/A 11/25/2018   Procedure: ESOPHAGOGASTRODUODENOSCOPY (EGD) WITH PROPOFOL;  Surgeon: Lollie Sails, MD;  Location: [facility] ENDOSCOPY;  Service: Endoscopy;  Laterality: N/A;   LEG AMPUTATION ABOVE KNEE Left    LOWER EXTREMITY ANGIOGRAPHY Left 06/07/2020   Procedure: LOWER EXTREMITY ANGIOGRAPHY;  Surgeon: Katha Cabal, MD;  Location: [place] [facility] LAB;  Service: Cardiovascular;  Laterality: Left;   LOWER EXTREMITY ANGIOGRAPHY Left 01/10/2021   Procedure: LOWER EXTREMITY ANGIOGRAPHY;  Surgeon: Katha Cabal, MD;  Location: [place] [facility];  Service: Cardiovascular;  Laterality: Left;   Patient Active Problem List   Diagnosis Date Noted   Atherosclerosis of artery of extremity with ulceration ([place]) 07/13/2020   Atherosclerosis of native arteries of the extremities with ulceration ([place]) 70/62/3762   Alcoholic cirrhosis of liver without ascites ([place]) 04/09/2019   Hyperlipidemia, mixed 04/09/2019   Malignant 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 ([place]) 04/09/2019   Thrombocytopenia ([place]) 04/09/2019   CAD (coronary artery disease) 10/14/2017   Lung cancer ([place]) 10/14/2017   Nodule of upper lobe of right lung 09/15/2017   Tobacco abuse 08/14/2017   Benign essential  hypertension 09/09/2013    REFERRING DIAG:  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  NPRS scale:  0/10 when walking none when standing  still or sitting.  Pain location: residual limb Pain description: ache, sore Aggravating factors: liner Relieving factors: removing liner.  Right wrist 1/10   OBJECTIVE:    MUSCLE LENGTH: Hamstrings: 07/24/2021:  Right -19 deg; with hip 90* Thomas test: 07/24/2021:  Right -19 deg; Left -27 deg   POSTURE: 07/24/2021:  rounded shoulders, forward head, flexed trunk , and weight shift right   LE ROM:   Active ROM Right 07/24/21 Left 07/24/21 Right 10/19/21 Left  10/19/21  Hip flexion        Hip extension Standing -20* Standing  -30* Standing -15* Standing -15*  Hip abduction        Hip adduction        Hip internal rotation        Hip external rotation        Knee flexion        Knee extension        Ankle dorsiflexion        Ankle plantarflexion        Ankle inversion        Ankle eversion         (Blank rows = not tested)   MMT:   MMT Right 07/24/21 Left 07/24/21  Hip flexion 5/5 4/4  Hip extension 3/5 3-/5  Hip abduction 3/5 3-/5  Hip adduction      Hip internal rotation      Hip external rotation      Knee flexion 3/5 NA  Knee extension 4/5 NA  Ankle dorsiflexion 5/5 NA  Ankle plantarflexion   NA  Ankle inversion   NA  Ankle eversion   NA  (Blank rows = not tested)   TRANSFERS: 10/19/2021: sit to/from stand - modified independent from chairs without armrests using RUE to push and stabilizes without UE touch / support.   07/24/2021:  Sit to stand: Modified independence and requires armrests & use of BUEs 07/24/2021:  Stand to sit: Modified independence and requires armrests & use of BUEs   GAIT: 10/19/2021 Resting HR 53 SpO2 98% Long walk / max tolerable until RLE circulation pain required seated rest - 355' in 6 min 45sec. With RW modified independent.  HR 72 SpO2 98% Gait velocity: 1.18 ft/sec  Pt neg ramp & curb with RW & TFA prosthesis modified independent.  He reports neg stairs with single rail & cane without assistance. He can now go to family &  friends homes by himself.  07/24/2021:  Gait pattern: hopping with good clearance RLE Distance walked: 30' Assistive device utilized: Environmental consultant - 2 wheeled Level of assistance: Modified independence Gait velocity:  Comments:    FUNCTIONAL TESTs:  10/16/2021  Berg Balance 30/56 10/16/2021 TUG with cane with supervision see below   Berg Balance Scale:  07/31/2021  11/56 [facility] PT Assessment - 07/31/21 0815                Standardized Balance Assessment    Standardized Balance Assessment  Berg Balance Test          Berg Balance Test    Sit to Stand Needs minimal aid to stand or to stabilize     Standing Unsupported Needs several tries to stand 30 seconds unsupported     Sitting with Back Unsupported but Feet Supported on Floor or Stool Able to sit safely and securely 2 minutes     Stand to Sit Uses backs of legs against chair to control descent     Transfers Able to transfer safely, definite need of hands     Standing Unsupported with Eyes Closed Needs help to keep from falling     Standing Unsupported with Feet Together Needs help to attain position and unable to hold for 15 seconds     From Standing, Reach Forward with Outstretched Arm Loses balance while trying/requires external support     From Standing Position, Pick up Object from Floor Unable to try/needs assist to keep balance     From Standing Position, Turn to Look Behind Over each Shoulder Needs assist to keep from losing balance and falling     Turn 360 Degrees Needs assistance while turning     Standing Unsupported, Alternately Place Feet on Step/Stool Needs assistance to keep from falling or unable to try     Standing Unsupported, One Foot in [employer] balance while stepping or standing     Standing on One Leg Unable to try or needs assist to prevent fall     Total Score 11     Berg comment: BERG  < 36 high risk for falls (close to 100%) 46-51 moderate (>50%)   37-45 significant (>80%) 52-55 lower (> 25%)         CARDIOVASCULAR RESPONSE: 07/24/2021:  Functional activity: gait hopping with RW Pre-activity vitals: HR: 54 SpO2: 99 Post-activity vitals: HR: 57 SpO2: 99 Modified Borg scale for dyspnea: 1: very mild shortness of breath   CURRENT PROSTHETIC WEAR ASSESSMENT: 07/24/2021:  Patient is dependent with: skin check, residual limb care, prosthetic cleaning, ply sock cleaning, correct ply sock adjustment, proper wear schedule/adjustment, and proper weight-bearing schedule/adjustment Prosthetic wear tolerance: wearing liner 3-4 hours 2x/day, 7 days/week Edema: nonpitting Residual limb condition: cylindrical shape, no open areas, invaginated scar posteriorly, normal temperature & color, slight dryness to skin, sensation intact. Prosthetic description: [facility] plans to deliver on Thursday, 4/27.  Prosthesis is silicon liner with velcro lanyard suspension, Single axis friction engaging knee with extension assist, flexible keel foot K code/activity level with prosthetic TIW:PYKDX 2 basic community with fixed cadence   Today's Treatment: 10/19/2021 See objective section for gait.  PT reviewed recommendation to increase activity level for short walk in home increasing frequency during awake hours, long walk which is max tolerable 1-2x/day and medium walk bw short & long 4-6 times / day. Pt & wife verbalized understanding. PT educated pt on risk of RLE amputation with continued smoking after 1 amputation for vascular reasons.  Pt verbalized understanding.   10/16/2021 Pt verbalized proper prosthetic care including donning, cleaning & adjusting ply socks.  PT instructed in socket revision >/= 6 months from delivery of prosthesis (07/27/21). PT recommended upgrading suspension to suction ring.  He is wearing prosthesis most of awake hours without skin issues or limb pain.  See above objective for Berg & TUG.  10/11/2021 Prosthetic Training with Transfemoral Prosthesis: PT explained , residual limb  pain, phantom sensation and phantom pain and demonstrated tapping technique over prosthesis to alleviate pain when  wearing the prosthesis and rubbing residual limb when prosthesis off, pt verbalized understanding Pt ambulated 80' x 2 reps with cane and HHA with cues for upright posture, wt shift over prosthesis in stance & step length.  Pt sit to / from stand x 3 reps chair without armrests using UE support safely  Neuromuscular Re-ed: Pt standing trunk extension x 10 reps reaching to chair seat with controlled concentric & eccentric motion with supervision.  Pt 180* turn in //bars with no UE support and CGA assist, x 1 rep each direction Pt standing balance step forward and laterally alternating each leg 5 reps x 5 sec hold, no UE support stepping prosthesis and cane stepping sound limb with prosthetic stance with tactile, verbal, and visual cueing with mirror. PT demo and explained picking up item from floor, pt returned demo x 3 reps with supervision without UE support.  Pt ambulated 8' in //bars with cane forward and backward x2  reps with supervision and verbal and tactile cueing for foot placement and hip hiking with backwards walking, side stepping x2 reps with supervision and verbal cueing for weight bearing and foot placement.        08/08/21 1106  PT Education  Education Details HEP at [employer]) Educated Patient;Child(ren)  Methods Explanation;Demonstration;Tactile cues;Verbal cues;Handout  Comprehension Returned demonstration;Tactile cues required;Verbalized understanding;Verbal cues required;Need further instruction   HEP for upright posture Standing with back to door frame reaching single & double UEs overhead hold 2 deep breathes 2 reps ea with target >5 times per day as only takes 1-2 minutes. Standing with back to counter with hands resting on counter for upright posture.  Goal is 5-10 minutes 2x/day. Hooklying supine dropping prosthesis over edge to stretch left hip  flexors 30 sec hold 2-3 reps 2x/day.   ASSESSMENT:   CLINICAL IMPRESSION: Pt met all LTGs with RW.  He appears to be functioning at community level with RW & TFA prosthesis.  He would benefit from a socket revision with suction ring suspension when current prosthesis is 6 months old (01/26/22).  He would benefit from additional skilled PT when he gets the new socket.   OBJECTIVE IMPAIRMENTS Abnormal gait, cardiopulmonary status limiting activity, decreased activity tolerance, decreased balance, decreased endurance, decreased knowledge of use of DME, decreased mobility, decreased ROM, decreased strength, impaired flexibility, postural dysfunction, and prosthetic dependency .    ACTIVITY LIMITATIONS community activity, driving, and church.    PERSONAL FACTORS Age, Fitness, Time since onset of injury/illness/exacerbation, and 3+ comorbidities: see PMH  are also affecting patient's functional outcome.    REHAB POTENTIAL: Good   CLINICAL DECISION MAKING: Evolving/moderate complexity   EVALUATION COMPLEXITY: Moderate     GOALS: Goals reviewed with patient? Yes   SHORT TERM GOALS: Target date: 09/21/2021   Patient verbalizes proper adjustment of ply socks.  Goal status: MET 09/20/2021 2.  Patient tolerates prosthesis >90% of awake hours daily without skin issues or limb pain <3/10 after standing. Goal status: partially met 6/21/203 as wearing prosthesis >90% awake hours with no skin issues but has limb pain up to 9/10 with standing.     3.  Patient able to pick up items from floor without UE support with supervision. Goal status NOT MET 09/18/21   4. Patient ambulates 100' with cane & prosthesis with modA / HHA.  Goal status: MET 6/19/23s    LONG TERM GOALS: Target date: 10/19/2021   Patient demonstrates & verbalized understanding of prosthetic care to enable safe utilization of prosthesis.  Baseline: SEE OBJECTIVE DATA Goal status: MET 10/16/2021   Patient tolerates prosthesis wear  >90% of awake hours without skin or limb pain issues. Baseline: SEE OBJECTIVE DATA Goal status: MET 10/16/2021   Berg Balance </= 36/56 to indicate lower fall risk Baseline: SEE OBJECTIVE DATA Goal status: Partially MET 10/16/2021   Patient ambulates >300' with LRAD & prosthesis safely independently for community mobility. Baseline: SEE OBJECTIVE DATA Goal status: MET 10/19/2021   Patient negotiates ramps, curbs & stairs with single rail with LRAD & prosthesis independently for community mobility. Baseline: SEE OBJECTIVE DATA Goal status: MET 10/19/2021   PLAN: PT FREQUENCY: 2x/week   PT DURATION: 12 weeks   PLANNED INTERVENTIONS: Therapeutic exercises, Therapeutic activity, Neuromuscular re-education, Balance training, Gait training, Patient/Family education, Stair training, Vestibular training, Prosthetic training, DME instructions, and scar mobilization   PLAN FOR NEXT SESSION:  discharge PT.   Jamey Reas, PT, DPT 10/19/2021, 9:35 AM

## 2021-11-21 NOTE — Progress Notes (Unsigned)
Cardiology Office Note  Date:  11/22/2021   ID:  Benny, Deutschman 1945/09/15, MRN [id number]  PCP:  Juluis Pitch, MD   Chief Complaint  Patient presents with   6 month follow up     "Doing well." Medications reviewed by the patient verbally.     HPI:  Samuel Carney is a 76 y.o. male hypertension lifelong smoker, 50-pack-year smoking history, COPD Alcohol, with cirrhosis, followed by GI CT scan of the chest  right upper lobe mass.  Prior radiation to lung for mass Stress test July 2019 PAD L CFA Endartectomy and L SFA/Pop/PT stent 07/13/20 c/b complete occlusions of LLE stent graft with resultant nonhealing left foot wound and diminished perfusion c/w Chronic limb-threatening ischemia 12/22 S/p amputation He presents for follow-up of his shortness of breath and coronary atherosclerosis on CT scan  LOV with myself 9/22 Seen by one of our providers 2/23  Still smoking, was doing 1 ppd, now 3 days with one pack Finished some of his PT, still working with 1 PT Ambulates with prosthesis using a walker Denies chest pain concerning for angina, reports compliance with his medications Denies pain in right lower extremity, Moderate disease right lower extremity by ABIs as detailed below  EKG personally reviewed by myself on todays visit Sinus bradycardia rate 52 bpm nonspecific ST abnormality consistent with repolarization abnormality  Labs reviewed Total chol 105, LDL 47 A1C 5.4  Seen at [facility] 12/22 L CFA Endartectomy and L SFA/Pop/PT stent 07/13/20 c/b complete occlusions of LLE stent graft with resultant nonhealing left foot wound and diminished perfusion c/w 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 Left ventricular ejection fraction, by estimation, is 60 to 65%. The  left ventricle has normal function. The left ventricle has no regional  wall motion abnormalities. There is moderate left ventricular hypertrophy.  Left ventricular  diastolic  parameters are consistent with Grade II diastolic dysfunction  (pseudonormalization).   2. Right ventricular systolic function is normal. The right ventricular  size is normal. Tricuspid regurgitation signal is inadequate for assessing  PA pressure.   3. Left atrial size was mildly dilated.   4. The mitral valve is normal in structure. Mild mitral valve  regurgitation. No evidence of mitral stenosis.   5. The aortic valve is normal in structure. Aortic valve regurgitation is  not visualized. Aortic valve sclerosis/calcification is present, without  any evidence of aortic stenosis.   6. The inferior vena cava is normal in size with greater than 50%  respiratory variability, suggesting right atrial pressure of 3 mmHg.   Myoview 05/2020: Low risk  ABI 7/23 Right ankle brachial index (ABI) of 0.75 is consistent with moderate  arterial occlusive disease at rest. Continuous wave Doppler waveforms at  the ankle of the posterior tibial artery were monophasic and monophasic at  the dorsalis pedis artery. Digit pressure is consistent with borderline  healing.    Other past medical history reviewed CT 01/2019 Stable appearance of post radiation fibrosis and consolidation of the anterior right lung apex, which almost completely obscures a nodule more discretely appreciated on prior examinations, measuring approximately 1.3 x 1.1 cm (series 3, image 31). 2. Stable prominent pretracheal and mediastinal lymph nodes  3.  Emphysema (ICD10-J43.9). 4. Coronary artery disease. Severe mixed aortic atherosclerosis. Aortic Atherosclerosis (ICD10-I70.0).  Lab work reviewed  total chol 123, LDL 48  Stress test July 2019 no ischemia  Echocardiogram December 2020 normal ejection fraction mild LVH grade 2 diastolic  dysfunction moderate MR moderately dilated left atrium mild to moderately elevated right heart pressures  Followed by GI for his alcoholic cirrhosis without ascites Still  drinking 1 pint whiskey daily down from 1/5 daily  Echocardiogram in December 2020 normal ejection fraction Mild to moderately elevated right heart pressures  Seen by Dr. Faith Rogue for consultation of his possible lung cancer Felt to be stage I carcinoma the lung based on CT scan and PET scan Biopsy performed 10/13/2017  CT scan with extensive coronary atherosclerosis Mild diffuse carotid disease aortic disease Moderate to heavy coronary calcification all 3 vessels  Last chest CT scan October 2020, followed by oncology Stable appearance of post radiation fibrosis and consolidation anterior right lung apex, appears nodule  PMH:   has a past medical history of Alcohol abuse, Aortic atherosclerosis ([place]), B12 deficiency, Cirrhosis ([place]), COPD (chronic obstructive pulmonary disease) ([place]), Coronary artery disease, GERD (gastroesophageal reflux disease), Glaucoma, Grade II diastolic dysfunction, History of kidney stones, HLD (hyperlipidemia), radiation therapy, Hypertension, Lung mass, Moderate mitral regurgitation, Pneumonia, PVD (peripheral vascular disease) ([place]), and Squamous cell carcinoma of lung, right ([place]) (2019).smoker, cOPD, coronary artery disease  PSH:    Past Surgical History:  Procedure Laterality Date   COLONOSCOPY WITH PROPOFOL     COLONOSCOPY WITH PROPOFOL N/A 11/25/2018   Procedure: COLONOSCOPY WITH PROPOFOL;  Surgeon: Lollie Sails, MD;  Location: [facility] ENDOSCOPY;  Service: Endoscopy;  Laterality: N/A;   ENDARTERECTOMY FEMORAL Left 07/13/2020   Procedure: ENDARTERECTOMY FEMORAL ( SFA STENT);  Surgeon: Katha Cabal, MD;  Location: [facility] ORS;  Service: Vascular;  Laterality: Left;   ESOPHAGOGASTRODUODENOSCOPY (EGD) WITH PROPOFOL N/A 11/25/2018   Procedure: ESOPHAGOGASTRODUODENOSCOPY (EGD) WITH PROPOFOL;  Surgeon: Lollie Sails, MD;  Location: [facility] ENDOSCOPY;  Service: Endoscopy;  Laterality: N/A;   LEG AMPUTATION ABOVE KNEE Left    LOWER EXTREMITY ANGIOGRAPHY Left  06/07/2020   Procedure: LOWER EXTREMITY ANGIOGRAPHY;  Surgeon: Katha Cabal, MD;  Location: [place] CV LAB;  Service: Cardiovascular;  Laterality: Left;   LOWER EXTREMITY ANGIOGRAPHY Left 01/10/2021   Procedure: LOWER EXTREMITY ANGIOGRAPHY;  Surgeon: Katha Cabal, MD;  Location: [place] CV LAB;  Service: Cardiovascular;  Laterality: Left;    Current Outpatient Medications  Medication Sig Dispense Refill   acetaminophen (TYLENOL) 500 MG tablet Take 1,000 mg by mouth every 6 (six) hours as needed for moderate pain or mild pain.     amLODipine (NORVASC) 10 MG tablet Take 1 tablet (10 mg total) by mouth daily. 90 tablet 3   aspirin EC 81 MG EC tablet Take 1 tablet (81 mg total) by mouth daily at 6 (six) AM. Swallow whole. 90 tablet 3   clopidogrel (PLAVIX) 75 MG tablet Take 1 tablet (75 mg total) by mouth daily. 30 tablet 11   cyanocobalamin (,VITAMIN B-12,) 1000 MCG/ML injection Inject 1,000 mcg into the muscle every 30 (thirty) days.     dorzolamide-timolol (COSOPT) 22.3-6.8 MG/ML ophthalmic solution Place 1 drop into both eyes 2 (two) times daily.  6   doxazosin (CARDURA) 2 MG tablet Take 1 tablet by mouth once daily 90 tablet 0   gabapentin (NEURONTIN) 300 MG capsule Take 1 capsule (300 mg total) by mouth at bedtime. 30 capsule 3   metoprolol tartrate (LOPRESSOR) 100 MG tablet Take 1 tablet (100 mg total) by mouth 2 (two) times daily. 180 tablet 3   pantoprazole (PROTONIX) 40 MG tablet Take 40 mg by mouth daily.     rosuvastatin (CRESTOR) 5 MG tablet Take 1  tablet (5 mg total) by mouth daily. 90 tablet 3   telmisartan (MICARDIS) 80 MG tablet Take 1 tablet (80 mg total) by mouth daily. 90 tablet 3   Travoprost, BAK Free, (TRAVATAN) 0.004 % SOLN ophthalmic solution Place 1 drop into both eyes at bedtime.     varenicline (CHANTIX) 1 MG tablet Take 1 mg by mouth daily.     No current facility-administered medications for this visit.     Allergies:   Patient has no known  allergies.   Social History:  The patient  reports that he has been smoking cigarettes. He has a 50.00 pack-year smoking history. He has never used smokeless tobacco. He reports that he does not currently use alcohol. He reports that he does not use drugs.   Family History:   family history includes Diabetes Mellitus II in his brother, maternal uncle, and mother; Stroke in his brother.    Review of Systems: Review of Systems  Constitutional: Negative.   HENT: Negative.    Respiratory:  Positive for shortness of breath.   Cardiovascular: Negative.   Gastrointestinal: Negative.   Musculoskeletal: Negative.   Neurological: Negative.   Psychiatric/Behavioral: Negative.    All other systems reviewed and are negative.    PHYSICAL EXAM: VS:  BP 110/60 (BP Location: Left Arm, Patient Position: Sitting, Cuff Size: Normal)   Pulse (!) 52   Ht 5\' 6"  (1.676 m)   Wt 147 lb (66.7 kg)   SpO2 99%   BMI 23.73 kg/m  , BMI Body mass index is 23.73 kg/m. Constitutional:  oriented to person, place, and time. No distress.  HENT:  Head: Grossly normal Eyes:  no discharge. No scleral icterus.  Neck: No JVD, no carotid bruits  Cardiovascular: Regular rate and rhythm, no murmurs appreciated Pulmonary/Chest: Moderately decreased breath sounds throughout, scattered Rales Abdominal: Soft.  no distension.  no tenderness.  Musculoskeletal: Normal range of motion, above-knee amputation left lower extremity Neurological:  normal muscle tone. Coordination normal. No atrophy Skin: Skin warm and dry Psychiatric: normal affect, pleasant   Recent Labs: 12/13/2020: BUN 20; Creatinine, Ser 1.32; Hemoglobin 11.4; Platelets 132; Potassium 4.3; Sodium 141    Lipid Panel No results found for: "CHOL", "HDL", "LDLCALC", "TRIG"    Wt Readings from Last 3 Encounters:  11/22/21 147 lb (66.7 kg)  05/22/21 145 lb (65.8 kg)  01/23/21 156 lb (70.8 kg)      ASSESSMENT AND PLAN:  Malignant neoplasm of upper  lobe of right lung [facility]) Completed radiation, followed by oncology Last CT scan end of 2020 Previously followed by oncology, Dr. Leron Croak with stable angina Heavy coronary calcification all 3 vessels as well as diffuse aortic atherosclerosis Completed Myoview March 2020 to low risk study Denies anginal symptoms Smoking cessation again discussed on today's visit  Tobacco abuse Cessation strongly recommended,   no desire to quit Reports smoking 1 pack every 3 days Discussed profound implications for his circulatory disease  Benign essential hypertension Blood pressure is well controlled on today's visit. No changes made to the medications.  Hyperlipidemia Continue Crestor 5 milligrams daily Cholesterol at goal  Alcohol abuse Alcohol cessation recommended    Total encounter time more than 30 minutes  Greater than 50% was spent in counseling and coordination of care with the patient    Orders Placed This Encounter  Procedures   EKG 12-Lead      Signed, Esmond Plants, M.D., Ph.D. 11/22/2021  [place], [place]

## 2021-11-22 ENCOUNTER — Encounter: Payer: Self-pay | Admitting: Cardiovascular Disease

## 2021-11-22 ENCOUNTER — Ambulatory Visit (INDEPENDENT_AMBULATORY_CARE_PROVIDER_SITE_OTHER): Payer: Medicare Other | Admitting: Cardiovascular Disease

## 2021-11-22 VITALS — BP 110/60 | HR 52 | Ht 66.0 in | Wt 147.0 lb

## 2021-11-22 DIAGNOSIS — E782 Mixed hyperlipidemia: Secondary | ICD-10-CM

## 2021-11-22 DIAGNOSIS — J449 Chronic obstructive pulmonary disease, unspecified: Secondary | ICD-10-CM | POA: Diagnosis not present

## 2021-11-22 DIAGNOSIS — I25118 Atherosclerotic heart disease of native coronary artery with other forms of angina pectoris: Secondary | ICD-10-CM

## 2021-11-22 DIAGNOSIS — R0600 Dyspnea, unspecified: Secondary | ICD-10-CM

## 2021-11-22 DIAGNOSIS — I2510 Atherosclerotic heart disease of native coronary artery without angina pectoris: Secondary | ICD-10-CM

## 2021-11-22 DIAGNOSIS — I10 Essential (primary) hypertension: Secondary | ICD-10-CM

## 2021-11-22 DIAGNOSIS — Z720 Tobacco use: Secondary | ICD-10-CM

## 2021-11-22 NOTE — Patient Instructions (Signed)
Medication Instructions:  No changes  If you need a refill on your cardiac medications before your next appointment, please call your pharmacy.   Lab work: No new labs needed  Testing/Procedures: No new testing needed  Follow-Up: At [facility], you and your health needs are our priority.  As part of our continuing mission to provide you with exceptional heart care, we have created designated Provider Care Teams.  These Care Teams include your primary Cardiologist (physician) and Advanced Practice Providers (APPs -  Physician Assistants and Nurse Practitioners) who all work together to provide you with the care you need, when you need it.  You will need a follow up appointment in 12 months  Providers on your designated Care Team:   Christopher Berge, NP Ryan Dunn, PA-C Cadence Furth, PA-C  COVID-19 Vaccine Information can be found at: [email or web address] For questions related to vaccine distribution or appointments, please email [email or web address] or call [phone].   

## 2021-11-29 ENCOUNTER — Ambulatory Visit: Payer: Medicare Other | Admitting: Anesthesiology

## 2021-11-29 ENCOUNTER — Encounter: Payer: Self-pay | Admitting: Internal Medicine

## 2021-11-29 ENCOUNTER — Encounter
Admission: RE | Disposition: A | Discharge status: Home / Self Care | Payer: Self-pay | Source: Home / Self Care | Attending: Internal Medicine

## 2021-11-29 ENCOUNTER — Ambulatory Visit
Admission: RE | Admit: 2021-11-29 | Discharge: 2021-11-29 | Disposition: A | Discharge status: Home / Self Care | Payer: Medicare Other | Attending: Internal Medicine | Admitting: Internal Medicine

## 2021-11-29 DIAGNOSIS — K7030 Alcoholic cirrhosis of liver without ascites: Secondary | ICD-10-CM | POA: Diagnosis not present

## 2021-11-29 DIAGNOSIS — I868 Varicose veins of other specified sites: Secondary | ICD-10-CM | POA: Diagnosis not present

## 2021-11-29 DIAGNOSIS — K648 Other hemorrhoids: Secondary | ICD-10-CM | POA: Insufficient documentation

## 2021-11-29 DIAGNOSIS — I2510 Atherosclerotic heart disease of native coronary artery without angina pectoris: Secondary | ICD-10-CM | POA: Diagnosis not present

## 2021-11-29 DIAGNOSIS — Z85118 Personal history of other malignant neoplasm of bronchus and lung: Secondary | ICD-10-CM | POA: Diagnosis not present

## 2021-11-29 DIAGNOSIS — K219 Gastro-esophageal reflux disease without esophagitis: Secondary | ICD-10-CM | POA: Insufficient documentation

## 2021-11-29 DIAGNOSIS — F17200 Nicotine dependence, unspecified, uncomplicated: Secondary | ICD-10-CM | POA: Diagnosis not present

## 2021-11-29 DIAGNOSIS — K3189 Other diseases of stomach and duodenum: Secondary | ICD-10-CM | POA: Insufficient documentation

## 2021-11-29 DIAGNOSIS — Z923 Personal history of irradiation: Secondary | ICD-10-CM | POA: Diagnosis not present

## 2021-11-29 DIAGNOSIS — J449 Chronic obstructive pulmonary disease, unspecified: Secondary | ICD-10-CM | POA: Insufficient documentation

## 2021-11-29 DIAGNOSIS — K2980 Duodenitis without bleeding: Secondary | ICD-10-CM | POA: Insufficient documentation

## 2021-11-29 DIAGNOSIS — K222 Esophageal obstruction: Secondary | ICD-10-CM | POA: Insufficient documentation

## 2021-11-29 DIAGNOSIS — I10 Essential (primary) hypertension: Secondary | ICD-10-CM | POA: Diagnosis not present

## 2021-11-29 DIAGNOSIS — K31819 Angiodysplasia of stomach and duodenum without bleeding: Secondary | ICD-10-CM | POA: Diagnosis not present

## 2021-11-29 DIAGNOSIS — K5730 Diverticulosis of large intestine without perforation or abscess without bleeding: Secondary | ICD-10-CM | POA: Insufficient documentation

## 2021-11-29 DIAGNOSIS — K2270 Barrett's esophagus without dysplasia: Secondary | ICD-10-CM | POA: Diagnosis not present

## 2021-11-29 DIAGNOSIS — Z79899 Other long term (current) drug therapy: Secondary | ICD-10-CM | POA: Diagnosis not present

## 2021-11-29 DIAGNOSIS — K766 Portal hypertension: Secondary | ICD-10-CM | POA: Insufficient documentation

## 2021-11-29 DIAGNOSIS — Z7902 Long term (current) use of antithrombotics/antiplatelets: Secondary | ICD-10-CM | POA: Diagnosis not present

## 2021-11-29 DIAGNOSIS — Z86010 Personal history of colonic polyps: Secondary | ICD-10-CM | POA: Insufficient documentation

## 2021-11-29 DIAGNOSIS — I739 Peripheral vascular disease, unspecified: Secondary | ICD-10-CM | POA: Insufficient documentation

## 2021-11-29 DIAGNOSIS — Z1211 Encounter for screening for malignant neoplasm of colon: Secondary | ICD-10-CM | POA: Diagnosis present

## 2021-11-29 HISTORY — PX: COLONOSCOPY WITH PROPOFOL: SHX5780

## 2021-11-29 HISTORY — PX: ESOPHAGOGASTRODUODENOSCOPY (EGD) WITH PROPOFOL: SHX5813

## 2021-11-29 SURGERY — COLONOSCOPY WITH PROPOFOL
Anesthesia: General

## 2021-11-29 MED ORDER — PROPOFOL 10 MG/ML IV BOLUS
INTRAVENOUS | Status: DC | PRN
  Administered 2021-11-29: 30 mg via INTRAVENOUS
  Administered 2021-11-29: 10 mg via INTRAVENOUS
  Administered 2021-11-29: 70 mg via INTRAVENOUS
  Administered 2021-11-29 (×2): 10 mg via INTRAVENOUS

## 2021-11-29 MED ORDER — PROPOFOL 500 MG/50ML IV EMUL
INTRAVENOUS | Status: DC | PRN
  Administered 2021-11-29: 155 ug/kg/min via INTRAVENOUS

## 2021-11-29 MED ORDER — DEXMEDETOMIDINE HCL IN NACL 200 MCG/50ML IV SOLN
INTRAVENOUS | Status: DC | PRN
  Administered 2021-11-29 (×2): 4 ug via INTRAVENOUS

## 2021-11-29 MED ORDER — GLYCOPYRROLATE 0.2 MG/ML IJ SOLN
INTRAMUSCULAR | Status: DC | PRN
  Administered 2021-11-29: .2 mg via INTRAVENOUS

## 2021-11-29 MED ORDER — LIDOCAINE HCL (CARDIAC) PF 100 MG/5ML IV SOSY
PREFILLED_SYRINGE | INTRAVENOUS | Status: DC | PRN
  Administered 2021-11-29: 100 mg via INTRAVENOUS

## 2021-11-29 MED ORDER — SODIUM CHLORIDE 0.9 % IV SOLN: INTRAVENOUS | Status: DC

## 2021-11-29 NOTE — Interval H&P Note (Signed)
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  The various methods of treatment have been discussed with the patient and family. After consideration of risks, benefits and other options for treatment, the patient has consented to  Procedure(s): COLONOSCOPY WITH PROPOFOL (N/A) ESOPHAGOGASTRODUODENOSCOPY (EGD) WITH PROPOFOL (N/A) as a surgical intervention.  The patient's history has been reviewed, patient examined, no change in status, stable for surgery.  I have reviewed the patient's chart and labs.  Questions were answered to the patient's satisfaction.     [place], [place]

## 2021-11-29 NOTE — Transfer of Care (Signed)
Immediate Anesthesia Transfer of Care Note  Patient: Samuel Carney  Procedure(s) Performed: COLONOSCOPY WITH PROPOFOL ESOPHAGOGASTRODUODENOSCOPY (EGD) WITH PROPOFOL  Patient Location: Endoscopy Unit  Anesthesia Type:General  Level of Consciousness: awake, drowsy and patient cooperative  Airway & Oxygen Therapy: Patient Spontanous Breathing and Patient connected to face mask oxygen  Post-op Assessment: Report given to RN and Post -op Vital signs reviewed and stable  Post vital signs: Reviewed and stable  Last Vitals:  Vitals Value Taken Time  BP 98/49 11/29/21 1037  Temp 36.7 C 11/29/21 1036  Pulse 62 11/29/21 1039  Resp 0 11/29/21 1039  SpO2 100 % 11/29/21 1039  Vitals shown include unvalidated device data.  Last Pain:  Vitals:   11/29/21 1036  TempSrc: Tympanic  PainSc: Asleep         Complications: No notable events documented.

## 2021-11-29 NOTE — Op Note (Signed)
[facility]astroenterology Patient Name: Samuel Carney Procedure Date: 11/29/2021 9:40 AM MRN: [id number] Account #: [id number] Date of Birth: 1945/06/23 Admit Type: Outpatient Age: 76 Room: [facility] ENDO ROOM 2 Gender: Male Note Status: Finalized Instrument Name: Jasper Riling [id number] Procedure:             Colonoscopy Indications:           High risk colon cancer surveillance: Personal history                         of colonic polyps, Personal history of rectal varices Providers:             Benay Pike. Alice Reichert MD, MD Referring MD:          Youlanda Roys. Lovie Macadamia, MD (Referring MD) Medicines:             Monitored Anesthesia Care, Propofol per Anesthesia Complications:         No immediate complications. Procedure:             Pre-Anesthesia Assessment:                        - The risks and benefits of the procedure and the                         sedation options and risks were discussed with the                         patient. All questions were answered and informed                         consent was obtained.                        - Patient identification and proposed procedure were                         verified prior to the procedure by the nurse. The                         procedure was verified in the procedure room.                        - The risks and benefits of the procedure and the                         sedation options and risks were discussed with the                         patient. All questions were answered and informed                         consent was obtained.                        - Patient identification and proposed procedure were                         verified prior to the procedure by the nurse. The  procedure was verified in the procedure room.                        - ASA Grade Assessment: III - A patient with severe                         systemic disease.                        - After reviewing the  risks and benefits, the patient                         was deemed in satisfactory condition to undergo the                         procedure.                        After obtaining informed consent, the colonoscope was                         passed under direct vision. Throughout the procedure,                         the patient's blood pressure, pulse, and oxygen                         saturations were monitored continuously. The                         Colonoscope was introduced through the anus and                         advanced to the the cecum, identified by appendiceal                         orifice and ileocecal valve. The colonoscopy was                         performed without difficulty. The patient tolerated                         the procedure well. The quality of the bowel                         preparation was good. The ileocecal valve, appendiceal                         orifice, and rectum were photographed. Findings:      The perianal and digital rectal examinations were normal. Pertinent       negatives include normal sphincter tone and no palpable rectal lesions.      Small, non-bleeding rectal varices were found.      Many small-mouthed diverticula were found in the sigmoid colon.      The exam was otherwise without abnormality. Impression:            - Rectal varices.                        - Diverticulosis  in the sigmoid colon.                        - The examination was otherwise normal.                        - No specimens collected. Recommendation:        - Patient has a contact number available for                         emergencies. The signs and symptoms of potential                         delayed complications were discussed with the patient.                         Return to normal activities tomorrow. Written                         discharge instructions were provided to the patient.                        - Resume Plavix (clopidogrel) at  prior dose today.                         Refer to managing physician for further adjustment of                         therapy.                        - Patient has a contact number available for                         emergencies. The signs and symptoms of potential                         delayed complications were discussed with the patient.                         Return to normal activities tomorrow. Written                         discharge instructions were provided to the patient.                        - Resume previous diet.                        - Continue present medications.                        - No repeat colonoscopy due to current age (38 years                         or older) and the absence of colonic polyps.                        - Return to GI office in 3 months.                        -  Follow up with Octavia Bruckner, PA-C in the GI office.                         [phone]                        - Telephone GI office to schedule appointment.                        - The findings and recommendations were discussed with                         the patient. Procedure Code(s):     --- Professional ---                        F6812, Colorectal cancer screening; colonoscopy on                         individual at high risk Diagnosis Code(s):     --- Professional ---                        K57.30, Diverticulosis of large intestine without                         perforation or abscess without bleeding                        K64.8, Other hemorrhoids                        Z86.010, Personal history of colonic polyps CPT copyright 2019 American Medical Association. All rights reserved. The codes documented in this report are preliminary and upon coder review may  be revised to meet current compliance requirements. Efrain Sella MD, MD 11/29/2021 10:34:54 AM This report has been signed electronically. Number of Addenda: 0 Note Initiated On: 11/29/2021 9:40  AM Scope Withdrawal Time: 0 hours 6 minutes 12 seconds  Total Procedure Duration: 0 hours 12 minutes 24 seconds  Estimated Blood Loss:  Estimated blood loss: none.      [facility]

## 2021-11-29 NOTE — Anesthesia Preprocedure Evaluation (Addendum)
Anesthesia Evaluation  Patient identified by MRN, date of birth, ID band Patient awake    Reviewed: Allergy & Precautions, NPO status , Patient's Chart, lab work & pertinent test results, reviewed documented beta blocker date and time   Airway Mallampati: III  TM Distance: >3 FB Neck ROM: full    Dental  (+) Edentulous Upper, Poor Dentition, Missing   Pulmonary COPD, Current Smoker and Patient abstained from smoking.,  Lung mass   Pulmonary exam normal        Cardiovascular Exercise Tolerance: Poor hypertension, Pt. on medications and Pt. on home beta blockers + CAD and + Peripheral Vascular Disease  Normal cardiovascular exam+ Valvular Problems/Murmurs (Moderate mitral regurgitation)   Grade II DD  S/p LLE amputation   Neuro/Psych negative neurological ROS  negative psych ROS   GI/Hepatic (+) Cirrhosis       , Alcoholic cirrhosis    AVM (arteriovenous malformation) of stomach, acquired with hemorrhage   Gastric varices   Rectal varices   Peptic duodenitis   Barrett's esophagus without dysplasia     Endo/Other  negative endocrine ROS  Renal/GU negative Renal ROS  negative genitourinary   Musculoskeletal   Abdominal Normal abdominal exam  (+)   Peds  Hematology  (+) Blood dyscrasia, anemia , Thrombocytopenia    Anesthesia Other Findings Past Medical History: No date: Alcohol abuse No date: Aortic atherosclerosis (HCC) No date: B12 deficiency No date: Cirrhosis ([place]) No date: COPD (chronic obstructive pulmonary disease) (HCC) No date: Coronary artery disease No date: GERD (gastroesophageal reflux disease) No date: Glaucoma No date: Grade II diastolic dysfunction No date: History of kidney stones No date: HLD (hyperlipidemia) No date: Hx of radiation therapy No date: Hypertension No date: Lung mass No date: Moderate mitral regurgitation No date: Pneumonia No date: PVD (peripheral vascular  disease) ([place]) 2019: Squamous cell carcinoma of lung, right (HCC)  Past Surgical History: No date: COLONOSCOPY WITH PROPOFOL 11/25/2018: COLONOSCOPY WITH PROPOFOL; N/A     Comment:  Procedure: COLONOSCOPY WITH PROPOFOL;  Surgeon:               Lollie Sails, MD;  Location: [facility] ENDOSCOPY;                Service: Endoscopy;  Laterality: N/A; 07/13/2020: ENDARTERECTOMY FEMORAL; Left     Comment:  Procedure: ENDARTERECTOMY FEMORAL ( SFA STENT);                Surgeon: Katha Cabal, MD;  Location: [facility] ORS;                Service: Vascular;  Laterality: Left; 11/25/2018: ESOPHAGOGASTRODUODENOSCOPY (EGD) WITH PROPOFOL; N/A     Comment:  Procedure: ESOPHAGOGASTRODUODENOSCOPY (EGD) WITH               PROPOFOL;  Surgeon: Lollie Sails, MD;  Location:               [facility] ENDOSCOPY;  Service: Endoscopy;  Laterality: N/A; No date: LEG AMPUTATION ABOVE KNEE; Left 06/07/2020: LOWER EXTREMITY ANGIOGRAPHY; Left     Comment:  Procedure: LOWER EXTREMITY ANGIOGRAPHY;  Surgeon:               Katha Cabal, MD;  Location: [place] CV LAB;               Service: Cardiovascular;  Laterality: Left; 01/10/2021: LOWER EXTREMITY ANGIOGRAPHY; Left     Comment:  Procedure: LOWER EXTREMITY ANGIOGRAPHY;  Surgeon:  Schnier, Dolores Lory, MD;  Location: [place] CV LAB;               Service: Cardiovascular;  Laterality: Left;     Reproductive/Obstetrics negative OB ROS                           Anesthesia Physical Anesthesia Plan  ASA: 3  Anesthesia Plan: General   Post-op Pain Management: Minimal or no pain anticipated   Induction: Intravenous  PONV Risk Score and Plan: Propofol infusion and TIVA  Airway Management Planned: Natural Airway  Additional Equipment:   Intra-op Plan:   Post-operative Plan:   Informed Consent: I have reviewed the patients History and Physical, chart, labs and discussed the procedure including the risks,  benefits and alternatives for the proposed anesthesia with the patient or authorized representative who has indicated his/her understanding and acceptance.     Dental advisory given  Plan Discussed with: Anesthesiologist, CRNA and Surgeon  Anesthesia Plan Comments:        Anesthesia Quick Evaluation

## 2021-11-29 NOTE — Anesthesia Procedure Notes (Signed)
Procedure Name: General with mask airway Date/Time: 11/29/2021 10:02 AM  Performed by: Kelton Pillar, CRNAPre-anesthesia Checklist: Patient identified, Emergency Drugs available, Suction available and Patient being monitored Patient Re-evaluated:Patient Re-evaluated prior to induction Oxygen Delivery Method: Simple face mask Induction Type: IV induction Placement Confirmation: positive ETCO2, CO2 detector and breath sounds checked- equal and bilateral Dental Injury: Teeth and Oropharynx as per pre-operative assessment

## 2021-11-29 NOTE — Anesthesia Postprocedure Evaluation (Signed)
Anesthesia Post Note  Patient: Samuel Carney  Procedure(s) Performed: COLONOSCOPY WITH PROPOFOL ESOPHAGOGASTRODUODENOSCOPY (EGD) WITH PROPOFOL  Patient location during evaluation: Endoscopy Anesthesia Type: General Level of consciousness: awake and alert Pain management: pain level controlled Vital Signs Assessment: post-procedure vital signs reviewed and stable Respiratory status: spontaneous breathing, nonlabored ventilation and respiratory function stable Cardiovascular status: blood pressure returned to baseline and stable Postop Assessment: no apparent nausea or vomiting Anesthetic complications: no   No notable events documented.   Last Vitals:  Vitals:   11/29/21 1056 11/29/21 1106  BP: (!) 118/58 128/68  Pulse: 65 (!) 56  Resp: 14 18  Temp:    SpO2: 99% 100%    Last Pain:  Vitals:   11/29/21 1106  TempSrc:   PainSc: 0-No pain                 [place] Ouch

## 2021-11-29 NOTE — H&P (Signed)
Outpatient short stay form Pre-procedure 11/29/2021 9:46 AM Lerin Jech K. Alice Reichert, M.D.  Primary Physician: Juluis Pitch, M.D.  Reason for visit:    1. 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 Peptic duodenitis 5. Rectal varices 6. Barrett's esophagus wo dysplasia - due for repeat 10/2020 7. GERD 8. Thrombocytopenia 9. Hx of adenomatous colon polyps  History of present illness:  He is overdue for repeat EGD for indications of esophageal variceal screening and hx of gastric AVMs with hemorrhage s/p APC, gastric varices, and BE.  - Will also arrange for surveillance colonoscopy 2/2 #9 and hx of poor prep    Current Facility-Administered Medications:    0.9 %  sodium chloride infusion, , Intravenous, Continuous, Shaila Gilchrest, Benay Pike, MD, Last Rate: 20 mL/hr at 11/29/21 0944, New Bag at 11/29/21 0944  Medications Prior to Admission  Medication Sig Dispense Refill Last Dose   amLODipine (NORVASC) 10 MG tablet Take 1 tablet (10 mg total) by mouth daily. 90 tablet 3 11/29/2021   doxazosin (CARDURA) 2 MG tablet Take 1 tablet by mouth once daily 90 tablet 0 11/28/2021   metoprolol tartrate (LOPRESSOR) 100 MG tablet Take 1 tablet (100 mg total) by mouth 2 (two) times daily. 180 tablet 3 11/29/2021   acetaminophen (TYLENOL) 500 MG tablet Take 1,000 mg by mouth every 6 (six) hours as needed for moderate pain or mild pain.      aspirin EC 81 MG EC tablet Take 1 tablet (81 mg total) by mouth daily at 6 (six) AM. Swallow whole. 90 tablet 3 11/24/2021   clopidogrel (PLAVIX) 75 MG tablet Take 1 tablet (75 mg total) by mouth daily. 30 tablet 11 11/24/2021   cyanocobalamin (,VITAMIN B-12,) 1000 MCG/ML injection Inject 1,000 mcg into the muscle every 30 (thirty) days.      dorzolamide-timolol (COSOPT) 22.3-6.8 MG/ML ophthalmic solution Place 1 drop into both eyes 2 (two) times daily.  6    gabapentin (NEURONTIN) 300 MG capsule Take 1  capsule (300 mg total) by mouth at bedtime. 30 capsule 3    pantoprazole (PROTONIX) 40 MG tablet Take 40 mg by mouth daily.      rosuvastatin (CRESTOR) 5 MG tablet Take 1 tablet (5 mg total) by mouth daily. 90 tablet 3    telmisartan (MICARDIS) 80 MG tablet Take 1 tablet (80 mg total) by mouth daily. 90 tablet 3    Travoprost, BAK Free, (TRAVATAN) 0.004 % SOLN ophthalmic solution Place 1 drop into both eyes at bedtime.      varenicline (CHANTIX) 1 MG tablet Take 1 mg by mouth daily.        No Known Allergies   Past Medical History:  Diagnosis Date   Alcohol abuse    Aortic atherosclerosis (HCC)    B12 deficiency    Cirrhosis (HCC)    COPD (chronic obstructive pulmonary disease) (HCC)    Coronary artery disease    GERD (gastroesophageal reflux disease)    Glaucoma    Grade II diastolic dysfunction    History of kidney stones    HLD (hyperlipidemia)    Hx of radiation therapy    Hypertension    Lung mass    Moderate mitral regurgitation    Pneumonia    PVD (peripheral vascular disease) (HCC)    Squamous cell carcinoma of lung, right ([place]) 2019    Review of systems:  Otherwise negative.    Physical Exam  Gen: Alert, oriented. Appears stated  age.  HEENT: [place]/AT. PERRLA. Lungs: CTA, no wheezes. CV: RR nl S1, S2. Abd: soft, benign, no masses. BS+ Ext: No edema. Pulses 2+    Planned procedures: Proceed with EGD and colonoscopy. The patient understands the nature of the planned procedure, indications, risks, alternatives and potential complications including but not limited to bleeding, infection, perforation, damage to internal organs and possible oversedation/side effects from anesthesia. The patient agrees and gives consent to proceed.  Please refer to procedure notes for findings, recommendations and patient disposition/instructions.     Lucero Ide K. Alice Reichert, M.D. Gastroenterology 11/29/2021  9:46 AM

## 2021-11-29 NOTE — Op Note (Signed)
[facility] Gastroenterology Patient Name: Samuel Carney Procedure Date: 11/29/2021 9:42 AM MRN: [id number] Account #: [id number] Date of Birth: Feb 13, 1946 Admit Type: Outpatient Age: 76 Room: [facility] ENDO ROOM 2 Gender: Male Note Status: Finalized Instrument Name: Upper Endoscope [id number] Procedure:             Upper GI endoscopy Indications:           Surveillance for malignancy due to personal history of                         Barrett's esophagus, Suspected esophageal reflux,                         Angioectasia of the stomach, Portal venous hypertension Providers:             Benay Pike. Alice Reichert MD, MD Referring MD:          Youlanda Roys. Lovie Macadamia, MD (Referring MD) Medicines:             Propofol per Anesthesia Complications:         No immediate complications. Procedure:             Pre-Anesthesia Assessment:                        - The risks and benefits of the procedure and the                         sedation options and risks were discussed with the                         patient. All questions were answered and informed                         consent was obtained.                        - Patient identification and proposed procedure were                         verified prior to the procedure by the nurse. The                         procedure was verified in the procedure room.                        - ASA Grade Assessment: III - A patient with severe                         systemic disease.                        - After reviewing the risks and benefits, the patient                         was deemed in satisfactory condition to undergo the                         procedure.  After obtaining informed consent, the endoscope was                         passed under direct vision. Throughout the procedure,                         the patient's blood pressure, pulse, and oxygen                         saturations were monitored  continuously. The Endoscope                         was introduced through the mouth, and advanced to the                         third part of duodenum. The upper GI endoscopy was                         accomplished without difficulty. The patient tolerated                         the procedure well. Findings:      A mild Schatzki ring was found in the lower third of the esophagus.      The esophagus and gastroesophageal junction were examined with white       light. There was no visual evidence of Barrett's esophagus.      Mild portal hypertensive gastropathy was found in the entire examined       stomach.      Five small angioectasias with Two lesions in the body had stigmata of       recent oozing ,three were non-bleeding a were found in the gastric body       and in the gastric antrum. Coagulation for hemostasis using argon plasma       at 0.8 liters/minute and 20 watts was successful. Estimated blood loss       was minimal.      There is no endoscopic evidence of varices in the cardia.      The examined duodenum was normal.      The exam was otherwise without abnormality. Impression:            - Mild Schatzki ring.                        - There is no endoscopic evidence of Barrett's                         esophagus.                        - Portal hypertensive gastropathy.                        - Five Two lesions in the body had stigmata of recent                         oozing ,three were non-bleeding a angioectasias in the                         stomach. Treated with argon plasma  coagulation (APC).                        - Normal examined duodenum.                        - The examination was otherwise normal.                        - No specimens collected. Recommendation:        - Repeat upper endoscopy in 2 years for surveillance.                        - Proceed with colonoscopy Procedure Code(s):     --- Professional ---                        [phone],  Esophagogastroduodenoscopy, flexible,                         transoral; with control of bleeding, any method Diagnosis Code(s):     --- Professional ---                        K31.819, Angiodysplasia of stomach and duodenum                         without bleeding                        K31.89, Other diseases of stomach and duodenum                        K76.6, Portal hypertension                        K22.70, Barrett's esophagus without dysplasia                        K22.2, Esophageal obstruction CPT copyright 2019 American Medical Association. All rights reserved. The codes documented in this report are preliminary and upon coder review may  be revised to meet current compliance requirements. Efrain Sella MD, MD 11/29/2021 10:14:18 AM This report has been signed electronically. Number of Addenda: 0 Note Initiated On: 11/29/2021 9:42 AM Estimated Blood Loss:  Estimated blood loss was minimal.      [facility]

## 2021-11-30 ENCOUNTER — Encounter: Payer: Self-pay | Admitting: Internal Medicine

## 2021-12-05 ENCOUNTER — Inpatient Hospital Stay
Admission: EM | Admit: 2021-12-05 | Discharge: 2021-12-09 | DRG: 378 | Disposition: A | Discharge status: Home / Self Care | Payer: Medicare Other | Attending: Internal Medicine | Admitting: Internal Medicine

## 2021-12-05 ENCOUNTER — Other Ambulatory Visit: Payer: Self-pay

## 2021-12-05 DIAGNOSIS — Z7982 Long term (current) use of aspirin: Secondary | ICD-10-CM | POA: Diagnosis not present

## 2021-12-05 DIAGNOSIS — I2510 Atherosclerotic heart disease of native coronary artery without angina pectoris: Secondary | ICD-10-CM | POA: Diagnosis present

## 2021-12-05 DIAGNOSIS — I700 Atherosclerosis of aorta: Secondary | ICD-10-CM | POA: Diagnosis present

## 2021-12-05 DIAGNOSIS — K921 Melena: Secondary | ICD-10-CM | POA: Diagnosis present

## 2021-12-05 DIAGNOSIS — K219 Gastro-esophageal reflux disease without esophagitis: Secondary | ICD-10-CM | POA: Diagnosis present

## 2021-12-05 DIAGNOSIS — Z923 Personal history of irradiation: Secondary | ICD-10-CM | POA: Diagnosis not present

## 2021-12-05 DIAGNOSIS — F1010 Alcohol abuse, uncomplicated: Secondary | ICD-10-CM | POA: Diagnosis present

## 2021-12-05 DIAGNOSIS — K31811 Angiodysplasia of stomach and duodenum with bleeding: Secondary | ICD-10-CM | POA: Diagnosis present

## 2021-12-05 DIAGNOSIS — K922 Gastrointestinal hemorrhage, unspecified: Principal | ICD-10-CM

## 2021-12-05 DIAGNOSIS — E872 Acidosis, unspecified: Secondary | ICD-10-CM | POA: Diagnosis present

## 2021-12-05 DIAGNOSIS — K7030 Alcoholic cirrhosis of liver without ascites: Secondary | ICD-10-CM | POA: Diagnosis present

## 2021-12-05 DIAGNOSIS — N179 Acute kidney failure, unspecified: Secondary | ICD-10-CM | POA: Diagnosis present

## 2021-12-05 DIAGNOSIS — E440 Moderate protein-calorie malnutrition: Secondary | ICD-10-CM | POA: Diagnosis present

## 2021-12-05 DIAGNOSIS — Z79899 Other long term (current) drug therapy: Secondary | ICD-10-CM | POA: Diagnosis not present

## 2021-12-05 DIAGNOSIS — I864 Gastric varices: Secondary | ICD-10-CM | POA: Diagnosis present

## 2021-12-05 DIAGNOSIS — I10 Essential (primary) hypertension: Secondary | ICD-10-CM | POA: Diagnosis present

## 2021-12-05 DIAGNOSIS — I340 Nonrheumatic mitral (valve) insufficiency: Secondary | ICD-10-CM | POA: Diagnosis present

## 2021-12-05 DIAGNOSIS — Z89612 Acquired absence of left leg above knee: Secondary | ICD-10-CM

## 2021-12-05 DIAGNOSIS — I739 Peripheral vascular disease, unspecified: Secondary | ICD-10-CM | POA: Diagnosis present

## 2021-12-05 DIAGNOSIS — E782 Mixed hyperlipidemia: Secondary | ICD-10-CM | POA: Diagnosis present

## 2021-12-05 DIAGNOSIS — Z6821 Body mass index (BMI) 21.0-21.9, adult: Secondary | ICD-10-CM

## 2021-12-05 DIAGNOSIS — Z7902 Long term (current) use of antithrombotics/antiplatelets: Secondary | ICD-10-CM | POA: Diagnosis not present

## 2021-12-05 DIAGNOSIS — H409 Unspecified glaucoma: Secondary | ICD-10-CM | POA: Diagnosis present

## 2021-12-05 DIAGNOSIS — Z85118 Personal history of other malignant neoplasm of bronchus and lung: Secondary | ICD-10-CM | POA: Diagnosis not present

## 2021-12-05 DIAGNOSIS — D696 Thrombocytopenia, unspecified: Secondary | ICD-10-CM | POA: Diagnosis present

## 2021-12-05 DIAGNOSIS — J449 Chronic obstructive pulmonary disease, unspecified: Secondary | ICD-10-CM | POA: Diagnosis present

## 2021-12-05 DIAGNOSIS — I5189 Other ill-defined heart diseases: Secondary | ICD-10-CM

## 2021-12-05 DIAGNOSIS — E861 Hypovolemia: Secondary | ICD-10-CM | POA: Diagnosis present

## 2021-12-05 DIAGNOSIS — F17210 Nicotine dependence, cigarettes, uncomplicated: Secondary | ICD-10-CM | POA: Diagnosis present

## 2021-12-05 DIAGNOSIS — C3490 Malignant neoplasm of unspecified part of unspecified bronchus or lung: Secondary | ICD-10-CM | POA: Diagnosis present

## 2021-12-05 DIAGNOSIS — D62 Acute posthemorrhagic anemia: Secondary | ICD-10-CM

## 2021-12-05 LAB — URINALYSIS, ROUTINE W REFLEX MICROSCOPIC
Bilirubin Urine: NEGATIVE
Glucose, UA: NEGATIVE mg/dL
Hgb urine dipstick: NEGATIVE
Ketones, ur: NEGATIVE mg/dL
Leukocytes,Ua: NEGATIVE
Nitrite: NEGATIVE
Protein, ur: NEGATIVE mg/dL
Specific Gravity, Urine: 1.016 (ref 1.005–1.030)
pH: 5 (ref 5.0–8.0)

## 2021-12-05 LAB — CBC
HCT: 26.9 % — ABNORMAL LOW (ref 39.0–52.0)
Hemoglobin: 8.1 g/dL — ABNORMAL LOW (ref 13.0–17.0)
MCH: 25.7 pg — ABNORMAL LOW (ref 26.0–34.0)
MCHC: 30.1 g/dL (ref 30.0–36.0)
MCV: 85.4 fL (ref 80.0–100.0)
Platelets: 139 10*3/uL — ABNORMAL LOW (ref 150–400)
RBC: 3.15 MIL/uL — ABNORMAL LOW (ref 4.22–5.81)
RDW: 15.2 % (ref 11.5–15.5)
WBC: 7.5 10*3/uL (ref 4.0–10.5)
nRBC: 0 % (ref 0.0–0.2)

## 2021-12-05 LAB — COMPREHENSIVE METABOLIC PANEL
ALT: 7 U/L (ref 0–44)
AST: 9 U/L — ABNORMAL LOW (ref 15–41)
Albumin: 3.5 g/dL (ref 3.5–5.0)
Alkaline Phosphatase: 57 U/L (ref 38–126)
Anion gap: 4 — ABNORMAL LOW (ref 5–15)
BUN: 76 mg/dL — ABNORMAL HIGH (ref 8–23)
CO2: 20 mmol/L — ABNORMAL LOW (ref 22–32)
Calcium: 9.5 mg/dL (ref 8.9–10.3)
Chloride: 118 mmol/L — ABNORMAL HIGH (ref 98–111)
Creatinine, Ser: 1.47 mg/dL — ABNORMAL HIGH (ref 0.61–1.24)
GFR, Estimated: 50 mL/min — ABNORMAL LOW (ref >60)
Glucose, Bld: 106 mg/dL — ABNORMAL HIGH (ref 70–99)
Potassium: 4.3 mmol/L (ref 3.5–5.1)
Sodium: 142 mmol/L (ref 135–145)
Total Bilirubin: 0.5 mg/dL (ref 0.3–1.2)
Total Protein: 7 g/dL (ref 6.5–8.1)

## 2021-12-05 LAB — HEMOGLOBIN AND HEMATOCRIT, BLOOD
HCT: 24.9 % — ABNORMAL LOW (ref 39.0–52.0)
Hemoglobin: 7.5 g/dL — ABNORMAL LOW (ref 13.0–17.0)

## 2021-12-05 LAB — LIPASE, BLOOD: Lipase: 50 U/L (ref 11–51)

## 2021-12-05 MED ORDER — OCTREOTIDE LOAD VIA INFUSION
50.0000 ug | Freq: Once | INTRAVENOUS | Status: AC
  Administered 2021-12-05: 50 ug via INTRAVENOUS
  Filled 2021-12-05: qty 25

## 2021-12-05 MED ORDER — PANTOPRAZOLE SODIUM 40 MG IV SOLR: 40.0000 mg | Freq: Two times a day (BID) | INTRAVENOUS | Status: DC

## 2021-12-05 MED ORDER — SODIUM CHLORIDE 0.9 % IV SOLN
2.0000 g | Freq: Once | INTRAVENOUS | Status: AC
  Administered 2021-12-05: 2 g via INTRAVENOUS
  Filled 2021-12-05: qty 20

## 2021-12-05 MED ORDER — SODIUM CHLORIDE 0.9 % IV SOLN
50.0000 ug/h | INTRAVENOUS | Status: DC
  Administered 2021-12-05 – 2021-12-07 (×5): 50 ug/h via INTRAVENOUS
  Filled 2021-12-05 (×8): qty 1

## 2021-12-05 MED ORDER — SODIUM CHLORIDE 0.9 % IV SOLN
2.0000 g | INTRAVENOUS | Status: DC
  Administered 2021-12-06: 2 g via INTRAVENOUS
  Filled 2021-12-05: qty 20

## 2021-12-05 MED ORDER — PANTOPRAZOLE 80MG IVPB - SIMPLE MED
80.0000 mg | Freq: Once | INTRAVENOUS | Status: AC
  Administered 2021-12-05: 80 mg via INTRAVENOUS
  Filled 2021-12-05: qty 100

## 2021-12-05 MED ORDER — PANTOPRAZOLE INFUSION (NEW) - SIMPLE MED
8.0000 mg/h | INTRAVENOUS | Status: DC
  Administered 2021-12-05 – 2021-12-08 (×6): 8 mg/h via INTRAVENOUS
  Filled 2021-12-05 (×7): qty 100

## 2021-12-05 NOTE — Assessment & Plan Note (Signed)
No complaints of chest pain.  -Keep holding aspirin and Plavix -Can resume metoprolol and Crestor

## 2021-12-05 NOTE — ED Notes (Signed)
Agreeable with previous RN assessment, pt alert and oriented in NAD. Denies stomach pain at this time.

## 2021-12-05 NOTE — ED Notes (Addendum)
Note added in error

## 2021-12-05 NOTE — Assessment & Plan Note (Signed)
Normotensive.  - Hold antihypertensives of amlodipine and metoprolol and telmisartan

## 2021-12-05 NOTE — Assessment & Plan Note (Signed)
Creatinine 1.47, up from baseline of 1.16 a year ago, likely secondary to hypovolemia from blood loss from multiple melanotic stools IV hydration and monitor, avoid nephrotoxins

## 2021-12-05 NOTE — ED Provider Notes (Addendum)
[facility] Provider Note    Event Date/Time   First MD Initiated Contact with Patient 12/05/21 1808     (approximate)   History   dark stools   HPI  Samuel Carney is a 76 y.o. male  with pmh alcoholic cirrhosis, gastric AVM and gastric varices, hypertension, hyperlipidemia, COPD who presents with black stool.  Last night patient had several episodes of black stool and has also had several episodes today.  Has sensation like he needs to go the bathroom and is not.  He endorses some mild abdominal discomfort but not severe denies lightheadedness dizziness no chest pain or difficulty breathing.  Patient denies a history of black stools.  She had surveillance endoscopy and colonoscopy just 6 days ago with Dr. Alice Reichert.  He had gastric varices that were nonbleeding and several angioectasias with stigmata of recent oozing that were treated with argon coagulation.      Past Medical History:  Diagnosis Date   Alcohol abuse    Aortic atherosclerosis (HCC)    B12 deficiency    Cirrhosis (HCC)    COPD (chronic obstructive pulmonary disease) (HCC)    Coronary artery disease    GERD (gastroesophageal reflux disease)    Glaucoma    Grade II diastolic dysfunction    History of kidney stones    HLD (hyperlipidemia)    Hx of radiation therapy    Hypertension    Lung mass    Moderate mitral regurgitation    Pneumonia    PVD (peripheral vascular disease) (HCC)    Squamous cell carcinoma of lung, right ([place]) 2019    Patient Active Problem List   Diagnosis Date Noted   Atherosclerosis of artery of extremity with ulceration ([place]) 07/13/2020   Atherosclerosis of native arteries of the extremities with ulceration ([place]) 37/16/9678   Alcoholic cirrhosis of liver without ascites ([place]) 04/09/2019   Hyperlipidemia, mixed 04/09/2019   Malignant 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 ([place]) 04/09/2019   Thrombocytopenia ([place]) 04/09/2019   CAD (coronary artery disease)  10/14/2017   Lung cancer ([place]) 10/14/2017   Nodule of upper lobe of right lung 09/15/2017   Tobacco abuse 08/14/2017   Benign essential hypertension 09/09/2013     Physical Exam  Triage Vital Signs: ED Triage Vitals  Enc Vitals Group     BP 12/05/21 1319 (!) 107/57     Pulse Rate 12/05/21 1319 65     Resp 12/05/21 1319 19     Temp 12/05/21 1319 99 F (37.2 C)     Temp src --      SpO2 12/05/21 1319 98 %     Weight --      Height --      Head Circumference --      Peak Flow --      Pain Score 12/05/21 1317 4     Pain Loc --      Pain Edu? --      Excl. in [place]? --     Most recent vital signs: Vitals:   12/05/21 1830 12/05/21 1900  BP: (!) 116/56 111/63  Pulse: 60 71  Resp:    Temp:    SpO2: 100% 100%     General: Awake, no distress.  CV:  Good peripheral perfusion.  Resp:  Normal effort.  Abd:  No distention.  Abdomen is soft and nontender Neuro:             Awake, Alert, Oriented x 3  Other:  Scant amount of dark stool on rectal exam   ED Results / Procedures / Treatments  Labs (all labs ordered are listed, but only abnormal results are displayed) Labs Reviewed  COMPREHENSIVE METABOLIC PANEL - Abnormal; Notable for the following components:      Result Value   Chloride 118 (*)    CO2 20 (*)    Glucose, Bld 106 (*)    BUN 76 (*)    Creatinine, Ser 1.47 (*)    AST 9 (*)    GFR, Estimated 50 (*)    Anion gap 4 (*)    All other components within normal limits  CBC - Abnormal; Notable for the following components:   RBC 3.15 (*)    Hemoglobin 8.1 (*)    HCT 26.9 (*)    MCH 25.7 (*)    Platelets 139 (*)    All other components within normal limits  LIPASE, BLOOD  URINALYSIS, ROUTINE W REFLEX MICROSCOPIC  TYPE AND SCREEN     EKG  EKG reviewed and interpreted by myself shows normal sinus rhythm with normal axis T wave inversion aVL, ST elevation with early repole pattern in 2 3 and aVF similar to prior   RADIOLOGY    PROCEDURES:  Critical  Care performed: No  Procedures  The patient is on the cardiac monitor to evaluate for evidence of arrhythmia and/or significant heart rate changes.   MEDICATIONS ORDERED IN ED: Medications  pantoprazole (PROTONIX) 80 mg /NS 100 mL IVPB (has no administration in time range)  pantoprozole (PROTONIX) 80 mg /NS 100 mL infusion (has no administration in time range)  pantoprazole (PROTONIX) injection 40 mg (has no administration in time range)  cefTRIAXone (ROCEPHIN) 2 g in sodium chloride 0.9 % 100 mL IVPB (has no administration in time range)  octreotide (SANDOSTATIN) 2 mcg/mL load via infusion 50 mcg (has no administration in time range)    And  octreotide (SANDOSTATIN) 500 mcg in sodium chloride 0.9 % 250 mL (2 mcg/mL) infusion (has no administration in time range)     IMPRESSION / MDM / ASSESSMENT AND PLAN / ED COURSE  I reviewed the triage vital signs and the nursing notes.                              Patient's presentation is most consistent with acute presentation with potential threat to life or bodily function.  Differential diagnosis includes, but is not limited to, upper GI bleed from gastric varices, gastric AVM, gastric angiectasia, ulcer  The patient is a 75 year old male with history of alcoholic cirrhosis who presents with black stools x1 day.  He has minimal associated abdominal discomfort.  He is hemodynamically stable.  Hemoglobin has dropped 3 g 8.1 from 11.1 just 2 months ago.  Patient denies history of black stools but I do see he has had gastric AVMs that required treatment endoscopically.  He is not having hematemesis.  On rectal exam he does have very scant amount of stool in the rectal vault but does appear dark barely enough to do stool guaiac.  Patient's BUN is also elevated.  Suspect upper GI bleed.  Given he does have a history of portal hypertensive gastropathy will cover with ceftriaxone and start Protonix.  Think this is more likely to be the angioectasias as  opposed to variceal bleeding we know he does not have esophageal varices.  Will admit to the hospital service.  FINAL CLINICAL IMPRESSION(S) / ED DIAGNOSES   Final diagnoses:  UGIB (upper gastrointestinal bleed)     Rx / DC Orders   ED Discharge Orders     None        Note:  This document was prepared using Dragon voice recognition software and may include unintentional dictation errors.   Rada Hay, MD 12/05/21 1849    Rada Hay, MD 12/05/21 [phone]

## 2021-12-05 NOTE — Assessment & Plan Note (Signed)
Increased assistance with transfers

## 2021-12-05 NOTE — Assessment & Plan Note (Signed)
Euvolemic.  Followed by cardiology Monitor for overload if being transfused.  Lasix between units

## 2021-12-05 NOTE — Assessment & Plan Note (Signed)
Mild thrombocytopenia of 139,000, likely secondary to cirrhosis Continue to monitor

## 2021-12-05 NOTE — H&P (Signed)
History and Physical    Patient: Samuel Carney BJY:[id number] DOB: September 29, 1945 DOA: 12/05/2021 DOS: the patient was seen and examined on 12/05/2021 PCP: Juluis Pitch, MD  Patient coming from: Home  Chief Complaint:  Chief Complaint  Patient presents with   dark stools   HPI: Samuel Carney is a 76 y.o. 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  He denies lightheadedness, chest pain, palpitations or shortness of breath.  Denies prior history of GI bleed. ED course and data review: On arrival BP 107/57 with pulse of 65, temp 99 and otherwise normal vitals.  Hemoglobin 8.1-7.5, down from 11.4 a year ago.  Creatinine 1.47 up from baseline of 1.16.  Urinalysis unremarkable..  EKG, personally reviewed and interpreted showing sinus rhythm at 60 with no acute ST-T wave changes. Patient typed and crossed, started on octreotide, pantoprazole bolus and  Past Medical History:  Diagnosis Date   Alcohol abuse    Aortic atherosclerosis (HCC)    B12 deficiency    Cirrhosis (HCC)    COPD (chronic obstructive pulmonary disease) (HCC)    Coronary artery disease    GERD (gastroesophageal reflux disease)    Glaucoma    Grade II diastolic dysfunction    History of kidney stones    HLD (hyperlipidemia)    Hx of radiation therapy    Hypertension    Lung mass    Moderate mitral regurgitation    Pneumonia    PVD (peripheral vascular disease) (HCC)    Squamous cell carcinoma of lung, right ([place]) 2019   Past Surgical History:   Procedure Laterality Date   COLONOSCOPY WITH PROPOFOL     COLONOSCOPY WITH PROPOFOL N/A 11/25/2018   Procedure: COLONOSCOPY WITH PROPOFOL;  Surgeon: Lollie Sails, MD;  Location: [facility] ENDOSCOPY;  Service: Endoscopy;  Laterality: N/A;   COLONOSCOPY WITH PROPOFOL N/A 11/29/2021   Procedure: COLONOSCOPY WITH PROPOFOL;  Surgeon: Toledo, Benay Pike, MD;  Location: [facility] ENDOSCOPY;  Service: Gastroenterology;  Laterality: N/A;   ENDARTERECTOMY FEMORAL Left 07/13/2020   Procedure: ENDARTERECTOMY FEMORAL ( SFA STENT);  Surgeon: Katha Cabal, MD;  Location: [facility] ORS;  Service: Vascular;  Laterality: Left;   ESOPHAGOGASTRODUODENOSCOPY (EGD) WITH PROPOFOL N/A 11/25/2018   Procedure: ESOPHAGOGASTRODUODENOSCOPY (EGD) WITH PROPOFOL;  Surgeon: Lollie Sails, MD;  Location: [facility] ENDOSCOPY;  Service: Endoscopy;  Laterality: N/A;   ESOPHAGOGASTRODUODENOSCOPY (EGD) WITH PROPOFOL N/A 11/29/2021   Procedure: ESOPHAGOGASTRODUODENOSCOPY (EGD) WITH PROPOFOL;  Surgeon: Toledo, Benay Pike, MD;  Location: [facility] ENDOSCOPY;  Service: Gastroenterology;  Laterality: N/A;   LEG AMPUTATION ABOVE KNEE Left    LOWER EXTREMITY ANGIOGRAPHY Left 06/07/2020   Procedure: LOWER EXTREMITY ANGIOGRAPHY;  Surgeon: Katha Cabal, MD;  Location: [facility];  Service: Cardiovascular;  Laterality: Left;   LOWER EXTREMITY ANGIOGRAPHY Left 01/10/2021   Procedure: LOWER EXTREMITY ANGIOGRAPHY;  Surgeon: Katha Cabal, MD;  Location: [facility];  Service: Cardiovascular;  Laterality: Left;   Social History:  reports that he has been smoking cigarettes. He has a 50.00 pack-year smoking history. He has never used smokeless tobacco. He reports  that he does not currently use alcohol. He reports that he does not use drugs.  No Known Allergies  Family History  Problem Relation Age of Onset   Diabetes Mellitus II Mother    Stroke Brother    Diabetes Mellitus II Brother    Diabetes Mellitus II Maternal Uncle      Prior to Admission medications   Medication Sig Start Date End Date Taking? Authorizing Provider  acetaminophen (TYLENOL) 500 MG tablet Take 1,000 mg by mouth every 6 (six) hours as needed for moderate pain or mild pain.    [provider]  amLODipine (NORVASC) 10 MG tablet Take 1 tablet (10 mg total) by mouth daily. 12/13/20   Minna Merritts, MD  aspirin EC 81 MG EC tablet Take 1 tablet (81 mg total) by mouth daily at 6 (six) AM. Swallow whole. 07/15/20   Stegmayer, Janalyn Harder, PA-C  clopidogrel (PLAVIX) 75 MG tablet Take 1 tablet (75 mg total) by mouth daily. 12/13/20   Minna Merritts, MD  cyanocobalamin (,VITAMIN B-12,) 1000 MCG/ML injection Inject 1,000 mcg into the muscle every 30 (thirty) days. 01/12/19   [provider]  dorzolamide-timolol (COSOPT) 22.3-6.8 MG/ML ophthalmic solution Place 1 drop into both eyes 2 (two) times daily. 09/10/17   [provider]  doxazosin (CARDURA) 2 MG tablet Take 1 tablet by mouth once daily 09/12/21   Minna Merritts, MD  gabapentin (NEURONTIN) 300 MG capsule Take 1 capsule (300 mg total) by mouth at bedtime. 01/18/21   Kris Hartmann, NP  metoprolol tartrate (LOPRESSOR) 100 MG tablet Take 1 tablet (100 mg total) by mouth 2 (two) times daily. 12/13/20   Minna Merritts, MD  pantoprazole (PROTONIX) 40 MG tablet Take 40 mg by mouth daily.    [provider]  rosuvastatin (CRESTOR) 5 MG tablet Take 1 tablet (5 mg total) by mouth daily. 12/13/20   Minna Merritts, MD  telmisartan (MICARDIS) 80 MG tablet Take 1 tablet (80 mg total) by mouth daily. 12/13/20   Minna Merritts, MD  Travoprost, BAK Free, (TRAVATAN) 0.004 % SOLN ophthalmic solution Place 1 drop into both eyes at bedtime. 05/22/16   [provider]  varenicline (CHANTIX) 1 MG tablet Take 1 mg by mouth daily. 05/19/21   [provider]    Physical Exam: Vitals:   12/05/21 2100 12/05/21 2130 12/05/21 2200 12/05/21 2238  BP: (!) 122/57  (!) 118/54 (!) 102/51 126/61  Pulse: 62 65 70 66  Resp: 19 16 18 18   Temp:    (!) 97.5 F (36.4 C)  TempSrc:      SpO2: 100% 100% 99% 97%   Physical Exam Vitals and nursing note reviewed.  Constitutional:      General: He is not in acute distress. HENT:     Head: Normocephalic and atraumatic.  Cardiovascular:     Rate and Rhythm: Normal rate and regular rhythm.     Heart sounds: Normal heart sounds.  Pulmonary:     Effort: Pulmonary effort is normal.     Breath sounds: Normal breath sounds.  Abdominal:     Palpations: Abdomen is soft.     Tenderness: There is no abdominal tenderness.  Neurological:     Mental Status: Mental status is at baseline.     Data Reviewed: Relevant notes from primary care and specialist visits, past discharge summaries as available in EHR, including Care Everywhere. Prior diagnostic testing as pertinent to current admission diagnoses Updated medications and problem  lists for reconciliation ED course, including vitals, labs, imaging, treatment and response to treatment Triage notes, nursing and pharmacy notes and ED provider's notes Notable results as noted in HPI   Assessment and Plan: * Melena Acute blood loss anemia History of gastric varices and gastric AVM s/p argon plasma coagulation on 8/30 Patient presents with melena with hemoglobin 8.1-7.5 down from 11 a year ago Serial H&H and transfuse as necessary to keep hemoglobin above 7 Continue octreotide infusion, Protonix infusion and continue Rocephin Keep n.p.o. 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  Followed by cardiology Monitor  for overload if being transfused.  Lasix between units  COPD (chronic obstructive pulmonary disease) (HCC) Not exacerbated DuoNebs as needed  Thrombocytopenia (HCC) Mild thrombocytopenia of 139,000, likely secondary to cirrhosis Continue to monitor  Alcoholic cirrhosis of liver without ascites ([place]) Not decompensated at this time  Lung cancer [facility]) S/p XRT follows with oncology  CAD (coronary artery disease) No complaints of chest pain.  Holding aspirin and metoprolol and rosuvastatin while NPO.  Benign essential hypertension Normotensive.  Hold antihypertensives of amlodipine and metoprolol and telmisartan tonight in the setting of GI bleed      Advance Care Planning:   Code Status: Prior   Consults: Gi Dr Vicente Males  Family Communication:   Severity of Illness: The appropriate patient status for this patient is INPATIENT. Inpatient status is judged to be reasonable and necessary in order to provide the required intensity of service to ensure the patient's safety. The patient's presenting symptoms, physical exam findings, and initial radiographic and laboratory data in the context of their chronic comorbidities is felt to place them at high risk for further clinical deterioration. Furthermore, it is not anticipated that the patient will be medically stable for discharge from the hospital within 2 midnights of admission.   * I certify that at the point of admission it is my clinical judgment that the patient will require inpatient hospital care spanning beyond 2 midnights from the point of admission due to high intensity of service, high risk for further deterioration and high frequency of surveillance required.*  Author: Athena Masse, MD 12/05/2021 10:41 PM  For on call review [email or web address].

## 2021-12-05 NOTE — ED Triage Notes (Addendum)
Pt comes with c/o dark stool since last night. Pt has had several episodes of dark stools. Pt states belly pain. Pt states he did have colonscopy at end of August.  Pt is on thinners.

## 2021-12-05 NOTE — Assessment & Plan Note (Signed)
S/p XRT follows with oncology

## 2021-12-05 NOTE — ED Provider Triage Note (Signed)
Emergency Medicine Provider Triage Evaluation Note  Samuel Carney , a 76 y.o. male  was evaluated in triage.  Pt complains of dark tarry stools, questionable GI bleed.  Review of Systems  Positive: See above Negative:   Physical Exam  BP (!) 107/57   Pulse 65   Temp 99 F (37.2 C)   Resp 19   SpO2 98%  Gen:   Awake, no distress   Resp:  Normal effort  MSK:   Moves extremities without difficulty  Other:    Medical Decision Making  Medically screening exam initiated at 1:21 PM.  Appropriate orders placed.  Samuel Carney was informed that the remainder of the evaluation will be completed by another provider, this initial triage assessment does not replace that evaluation, and the importance of remaining in the ED until their evaluation is complete.  Appears stable.  GI bleed protocols initiated by nursing staff   Versie Starks, PA-C 12/05/21 1321

## 2021-12-05 NOTE — Assessment & Plan Note (Addendum)
Acute blood loss anemia History of gastric varices and gastric AVM s/p argon plasma coagulation on 8/30 Patient presents with melena with hemoglobin 8.1-7.5 down from 11 a year ago Continue octreotide infusion, Protonix infusion and continue Rocephin S/p 1 unit of PRBC, hemoglobin at 8.4 this morning. GI to do EGD after 3 days of stopping Plavix. -Continue to monitor

## 2021-12-05 NOTE — Assessment & Plan Note (Signed)
Not exacerbated DuoNebs as needed

## 2021-12-05 NOTE — Assessment & Plan Note (Signed)
Not decompensated at this time

## 2021-12-06 DIAGNOSIS — E440 Moderate protein-calorie malnutrition: Secondary | ICD-10-CM | POA: Insufficient documentation

## 2021-12-06 DIAGNOSIS — K921 Melena: Secondary | ICD-10-CM | POA: Diagnosis not present

## 2021-12-06 LAB — HEMOGLOBIN AND HEMATOCRIT, BLOOD
HCT: 22 % — ABNORMAL LOW (ref 39.0–52.0)
HCT: 27.1 % — ABNORMAL LOW (ref 39.0–52.0)
HCT: 26.8 % — ABNORMAL LOW (ref 39.0–52.0)
HCT: 28.9 % — ABNORMAL LOW (ref 39.0–52.0)
HCT: 27.3 % — ABNORMAL LOW (ref 39.0–52.0)
Hemoglobin: 6.7 g/dL — ABNORMAL LOW (ref 13.0–17.0)
Hemoglobin: 8.4 g/dL — ABNORMAL LOW (ref 13.0–17.0)
Hemoglobin: 8.4 g/dL — ABNORMAL LOW (ref 13.0–17.0)
Hemoglobin: 8.9 g/dL — ABNORMAL LOW (ref 13.0–17.0)
Hemoglobin: 8.5 g/dL — ABNORMAL LOW (ref 13.0–17.0)

## 2021-12-06 MED ORDER — ADULT MULTIVITAMIN W/MINERALS CH
1.0000 | ORAL_TABLET | Freq: Every day | ORAL | Status: DC
  Administered 2021-12-06 – 2021-12-09 (×4): 1 via ORAL
  Filled 2021-12-06 (×4): qty 1

## 2021-12-06 MED ORDER — ONDANSETRON HCL 4 MG/2ML IJ SOLN: 4.0000 mg | Freq: Four times a day (QID) | INTRAMUSCULAR | Status: DC | PRN

## 2021-12-06 MED ORDER — ROSUVASTATIN CALCIUM 5 MG PO TABS
5.0000 mg | ORAL_TABLET | Freq: Every day | ORAL | Status: DC
  Administered 2021-12-07 – 2021-12-09 (×3): 5 mg via ORAL
  Filled 2021-12-06 (×3): qty 1

## 2021-12-06 MED ORDER — CYANOCOBALAMIN 1000 MCG/ML IJ SOLN
1000.0000 ug | INTRAMUSCULAR | Status: DC
Start: 2021-12-06 — End: 2021-12-09
  Administered 2021-12-06: 1000 ug via INTRAMUSCULAR
  Filled 2021-12-06: qty 1

## 2021-12-06 MED ORDER — DORZOLAMIDE HCL-TIMOLOL MAL 2-0.5 % OP SOLN
1.0000 [drp] | Freq: Two times a day (BID) | OPHTHALMIC | Status: DC
  Administered 2021-12-06 – 2021-12-09 (×6): 1 [drp] via OPHTHALMIC
  Filled 2021-12-06: qty 10

## 2021-12-06 MED ORDER — ACETAMINOPHEN 650 MG RE SUPP: 650.0000 mg | Freq: Four times a day (QID) | RECTAL | Status: DC | PRN

## 2021-12-06 MED ORDER — ACETAMINOPHEN 325 MG PO TABS: 650.0000 mg | ORAL_TABLET | Freq: Four times a day (QID) | ORAL | Status: DC | PRN

## 2021-12-06 MED ORDER — BOOST / RESOURCE BREEZE PO LIQD CUSTOM
1.0000 | Freq: Three times a day (TID) | ORAL | Status: DC
  Administered 2021-12-06 – 2021-12-09 (×7): 1 via ORAL

## 2021-12-06 MED ORDER — ONDANSETRON HCL 4 MG PO TABS: 4.0000 mg | ORAL_TABLET | Freq: Four times a day (QID) | ORAL | Status: DC | PRN

## 2021-12-06 MED ORDER — SODIUM CHLORIDE 0.9% IV SOLUTION: Freq: Once | INTRAVENOUS | Status: DC

## 2021-12-06 MED ORDER — PROSOURCE PLUS PO LIQD
30.0000 mL | Freq: Three times a day (TID) | ORAL | Status: DC
  Administered 2021-12-06 – 2021-12-09 (×6): 30 mL via ORAL
  Filled 2021-12-06 (×10): qty 30

## 2021-12-06 MED ORDER — SODIUM CHLORIDE 0.9 % IV BOLUS
500.0000 mL | Freq: Once | INTRAVENOUS | Status: AC
  Administered 2021-12-06: 500 mL via INTRAVENOUS

## 2021-12-06 MED ORDER — MORPHINE SULFATE (PF) 2 MG/ML IV SOLN: 2.0000 mg | INTRAVENOUS | Status: DC | PRN

## 2021-12-06 MED ORDER — SODIUM CHLORIDE 0.9% IV SOLUTION: Freq: Once | INTRAVENOUS | Status: AC

## 2021-12-06 NOTE — Assessment & Plan Note (Signed)
Continue statin. 

## 2021-12-06 NOTE — Progress Notes (Signed)
Initial Nutrition Assessment  DOCUMENTATION CODES:   Non-severe (moderate) malnutrition in context of chronic illness  INTERVENTION:   -Boost Breeze po TID, each supplement provides 250 kcal and 9 grams of protein  -30 ml Prosource Plus TID, each supplement provides 100 kcals and 15 grams protein -MVI with minerals daily -RD will follow for diet advancement and adjust supplement regimen as appropriate  NUTRITION DIAGNOSIS:   Moderate Malnutrition related to chronic illness (COPD) as evidenced by mild fat depletion, moderate fat depletion, mild muscle depletion, moderate muscle depletion.  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  Pt admitted with melena.   Reviewed I/O's: -69 ml x 24 hours  UOP: 625 ml x 24 hours  Pt is currently NPO (advanced to clear liquids after visit). Pt is awaiting GI evaluation. Plan for push enteroscopy once off plavix.   Spoke with pt and significant other at bedside. Both report that pt has decreased appetite over the past 3-4 months and having limited desire to eat. Per significant other, pt is served 3 meals per day, but often only eats bites at sips at meals. Per pt, he has multiple missing teeth, which also limits what he is able to eat. He is unable to eat tough meats, such as chicken and steak due to increased energy expenditure when eating as well as coughing when trying to swallow.   Per pt, his UBW is around 170# approximately 9 months ago and reports progressive wt loss  since then. Reviewed wt hx; pt has experienced a 4.6% wt loss over the past 6 months, which is not significant for time frame.   Discussed importance of good meal and supplement intake to promote healing.   Medications reviewed.   Labs reviewed: CBGS: 136 (inpatient orders for glycemic control are none).    NUTRITION - FOCUSED PHYSICAL EXAM:  Flowsheet Row Most Recent Value  Orbital Region Mild depletion  Upper Arm Region Mild depletion  Thoracic and Lumbar Region Mild depletion  Buccal Region Mild depletion  Temple Region Moderate depletion  Clavicle Bone Region Moderate depletion  Clavicle and Acromion Bone Region Moderate depletion  Scapular Bone Region Moderate depletion  Dorsal Hand Moderate depletion  Patellar Region Moderate depletion  Anterior Thigh Region Moderate depletion  Posterior Calf Region Moderate depletion  Edema (RD Assessment) None  Hair Reviewed  Eyes Reviewed  Mouth Reviewed  Skin Reviewed  Nails Reviewed       Diet Order:   Diet Order             Diet clear liquid Room service appropriate? Yes; Fluid consistency: Thin  Diet effective now                   EDUCATION NEEDS:   Education needs have been addressed  Skin:  Skin Assessment: Reviewed RN Assessment  Last BM:  12/05/21  Height:   Ht Readings from Last 1 Encounters:  12/06/21 5\' 7"  (1.702 m)    Weight:   Wt Readings from Last 1 Encounters:  12/05/21 62.3 kg    Ideal Body Weight:  61.9 kg (adjusted for lt AKA)  BMI:  Body mass index is 21.51 kg/m.  Estimated Nutritional Needs:   Kcal:  1850-2050  Protein:  90-105 grams  Fluid:  > 1.8 L    Loistine Chance, RD, LDN, [place] Registered Dietitian II Certified Diabetes Care and Education Specialist Please refer to [facility] for RD and/or RD on-call/weekend/after hours pager

## 2021-12-06 NOTE — Hospital Course (Addendum)
Taken from H&P.  Samuel Carney is a 76 y.o. 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  He denies lightheadedness, chest pain, palpitations or shortness of breath.  Denies prior history of GI bleed. ED course and data review: On arrival BP 107/57 with pulse of 65, temp 99 and otherwise normal vitals.  Hemoglobin 8.1-7.5, down from 11.4 a year ago.  Creatinine 1.47 up from baseline of 1.16.  Urinalysis unremarkable..  EKG, personally reviewed and interpreted showing sinus rhythm at 60 with no acute ST-T wave changes. Patient typed and crossed, started on octreotide, pantoprazole. GI was consulted.  9/6: Hemoglobin improved to 8.4 after getting 1 unit of PRBC. GI will repeat EGD 3 days after stopping Plavix.  If no obvious source found then they might do capsule endoscopy. Did not had any bowel movement since this morning.  9/7: Hemodynamically stable with mild worsening of metabolic acidosis with bicarb of 19, sodium at 146.  Renal function seems stable but still elevated above baseline.  Giving gentle IV fluid for the next 20 hours.  Hemoglobin seems stable.  Did not had any more bowel movements.  Per patient and fianc last Plavix dose was on Tuesday.  Going for EGD tomorrow.  9/8: Patient had his EGD today, found to have 5 nonbleeding angiectasia's in stomach and 3 in duodenum which were treated with argon plasma.  Octreotide was discontinued and GI is recommending p.o. twice daily Protonix and outpatient follow-up. GI  would like to keep for another night for observation and if hemoglobin remains stable discharge tomorrow.  9/9: Patient remained stable.  Did not had any more melena.  He was advised to resume his aspirin from tomorrow and keep holding Plavix for another 3 days.  Patient need to have a discussion with his vascular surgeon regarding the need of continuation of DAPT to decrease the risk of bleeding. We also held his home metoprolol due to heart rate remaining 50s without it during hospitalization.  His PCP or cardiologist can decide when to restart at appropriate doses.  Patient will continue with the rest of his home medications and follow-up with his providers for further recommendations.Marland Kitchen

## 2021-12-06 NOTE — Consult Note (Signed)
[name] , MD [address], [place], [place], [place], [address]40 [address] [place], [place], [place], [address] Phone: [phone]  Fax: [phone]  Consultation  Referring Provider:     Dr Reesa Chew Primary Care Physician:  Juluis Pitch, MD Primary Gastroenterologist:  Dr. Alice Reichert         Reason for Consultation:     GI bleed  Date of Admission:  12/05/2021 Date of Consultation:  12/06/2021         HPI:   Samuel Carney is a 76 y.o. male is a patient who follows with Dr Alice Reichert at [facility]. 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     Had an outpatient colonoscopy with Dr. Alice Reichert on 11/29/2021 for small rectal varices otherwise no abnormality.  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  He presented emergency room with dark stools for 2 -3 days , no abdominal pain , no vomiting, on plavix, last dose was yesterday.  In the ER found to have a hemoglobin of 8 g down from  in July was 11.1 g on admission creatinine was elevated at 1.  4 7.  This morning hemoglobin 8.4 g.  No complaints today   Past Medical History:  Diagnosis Date   Alcohol abuse    Aortic atherosclerosis (HCC)    B12 deficiency    Cirrhosis (HCC)    COPD (chronic obstructive pulmonary disease) (HCC)    Coronary artery disease    GERD (gastroesophageal reflux disease)    Glaucoma    Grade II diastolic dysfunction    History of kidney stones    HLD (hyperlipidemia)    Hx of radiation therapy    Hypertension    Lung mass    Moderate mitral regurgitation    Pneumonia    PVD (peripheral vascular disease) (HCC)    Squamous cell carcinoma of lung, right ([place]) 2019    Past Surgical History:  Procedure Laterality Date   COLONOSCOPY WITH PROPOFOL     COLONOSCOPY WITH PROPOFOL N/A 11/25/2018    Procedure: COLONOSCOPY WITH PROPOFOL;  Surgeon: Lollie Sails, MD;  Location: [facility] ENDOSCOPY;  Service: Endoscopy;  Laterality: N/A;   COLONOSCOPY WITH PROPOFOL N/A 11/29/2021   Procedure: COLONOSCOPY WITH PROPOFOL;  Surgeon: Toledo, Benay Pike, MD;  Location: [facility] ENDOSCOPY;  Service: Gastroenterology;  Laterality: N/A;   ENDARTERECTOMY FEMORAL Left 07/13/2020   Procedure: ENDARTERECTOMY FEMORAL ( SFA STENT);  Surgeon: Katha Cabal, MD;  Location: [facility] ORS;  Service: Vascular;  Laterality: Left;   ESOPHAGOGASTRODUODENOSCOPY (EGD) WITH PROPOFOL N/A 11/25/2018   Procedure: ESOPHAGOGASTRODUODENOSCOPY (EGD) WITH PROPOFOL;  Surgeon: Lollie Sails, MD;  Location: [facility] ENDOSCOPY;  Service: Endoscopy;  Laterality: N/A;   ESOPHAGOGASTRODUODENOSCOPY (EGD) WITH PROPOFOL N/A 11/29/2021   Procedure: ESOPHAGOGASTRODUODENOSCOPY (EGD) WITH PROPOFOL;  Surgeon: Toledo, Benay Pike, MD;  Location: [facility] ENDOSCOPY;  Service: Gastroenterology;  Laterality: N/A;   LEG AMPUTATION ABOVE KNEE Left    LOWER EXTREMITY ANGIOGRAPHY Left 06/07/2020   Procedure: LOWER EXTREMITY ANGIOGRAPHY;  Surgeon: Katha Cabal, MD;  Location: [place] CV LAB;  Service: Cardiovascular;  Laterality: Left;   LOWER EXTREMITY ANGIOGRAPHY Left 01/10/2021   Procedure: LOWER EXTREMITY ANGIOGRAPHY;  Surgeon: Katha Cabal, MD;  Location: [place] CV LAB;  Service: Cardiovascular;  Laterality: Left;  Prior to Admission medications   Medication Sig Start Date End Date Taking? Authorizing Provider  acetaminophen (TYLENOL) 500 MG tablet Take 1,000 mg by mouth every 6 (six) hours as needed for moderate pain or mild pain.    [provider]  amLODipine (NORVASC) 10 MG tablet Take 1 tablet (10 mg total) by mouth daily. 12/13/20   Minna Merritts, MD  aspirin EC 81 MG EC tablet Take 1 tablet (81 mg total) by mouth daily at 6 (six) AM. Swallow whole. 07/15/20   Stegmayer, Janalyn Harder, PA-C  clopidogrel (PLAVIX) 75  MG tablet Take 1 tablet (75 mg total) by mouth daily. 12/13/20   Minna Merritts, MD  cyanocobalamin (,VITAMIN B-12,) 1000 MCG/ML injection Inject 1,000 mcg into the muscle every 30 (thirty) days. 01/12/19   [provider]  dorzolamide-timolol (COSOPT) 22.3-6.8 MG/ML ophthalmic solution Place 1 drop into both eyes 2 (two) times daily. 09/10/17   [provider]  doxazosin (CARDURA) 2 MG tablet Take 1 tablet by mouth once daily 09/12/21   Minna Merritts, MD  gabapentin (NEURONTIN) 300 MG capsule Take 1 capsule (300 mg total) by mouth at bedtime. 01/18/21   Kris Hartmann, NP  metoprolol tartrate (LOPRESSOR) 100 MG tablet Take 1 tablet (100 mg total) by mouth 2 (two) times daily. 12/13/20   Minna Merritts, MD  pantoprazole (PROTONIX) 40 MG tablet Take 40 mg by mouth daily.    [provider]  rosuvastatin (CRESTOR) 5 MG tablet Take 1 tablet (5 mg total) by mouth daily. 12/13/20   Minna Merritts, MD  telmisartan (MICARDIS) 80 MG tablet Take 1 tablet (80 mg total) by mouth daily. 12/13/20   Minna Merritts, MD  Travoprost, BAK Free, (TRAVATAN) 0.004 % SOLN ophthalmic solution Place 1 drop into both eyes at bedtime. 05/22/16   [provider]  varenicline (CHANTIX) 1 MG tablet Take 1 mg by mouth daily. 05/19/21   [provider]    Family History  Problem Relation Age of Onset   Diabetes Mellitus II Mother    Stroke Brother    Diabetes Mellitus II Brother    Diabetes Mellitus II Maternal Uncle      Social History   Tobacco Use   Smoking status: Every Day    Packs/day: 1.00    Years: 50.00    Total pack years: 50.00    Types: Cigarettes   Smokeless tobacco: Never   Tobacco comments:    01/23/21 cut back to about 3/4 of a pack per day per patient     05/22/2021 half pack a day  Vaping Use   Vaping Use: Never used  Substance Use Topics   Alcohol use: Not Currently    Comment: "1 pint of liquor per day"; 01/23/21 about 6oz per day   Drug  use: Never    Allergies as of 12/05/2021   (No Known Allergies)    Review of Systems:    All systems reviewed and negative except where noted in HPI.   Physical Exam:  Vital signs in last 24 hours: Temp:  [97.5 F (36.4 C)-99 F (37.2 C)] 98.2 F (36.8 C) (09/06 0440) Pulse Rate:  [60-98] 71 (09/06 0440) Resp:  [14-20] 20 (09/06 0440) BP: (102-139)/(51-75) 119/67 (09/06 0440) SpO2:  [97 %-100 %] 100 % (09/06 0440) Weight:  [62.3 kg] 62.3 kg (09/05 2300) Last BM Date : 12/05/21 General:   Pleasant, cooperative in NAD Head:  Normocephalic and atraumatic. Eyes:   No  icterus.   Conjunctiva pink. PERRLA. Ears:  Normal auditory acuity. Neck:  Supple; no masses or thyroidomegalLungs: Respirations even and unlabored. Lungs clear to auscultation bilaterally.   No wheezes, crackles, or rhonchi.  Heart:  Regular rate and rhythm;  Without murmur, clicks, rubs or gallops Abdomen:  Soft, nondistended, nontender. Normal bowel sounds. No appreciable masses or hepatomegaly.  No rebound or guarding.  Neurologic:  Alert and oriented x3;  grossly normal neurologically. Psych:  Alert and cooperative. Normal affect.  LAB RESULTS: Recent Labs    12/05/21 1318 12/05/21 2035 12/06/21 0109 12/06/21 0637 12/06/21 0906  WBC 7.5  --   --   --   --   HGB 8.1*   < > 6.7* 8.4* 8.4*  HCT 26.9*   < > 22.0* 27.1* 26.8*  PLT 139*  --   --   --   --    < > = values in this interval not displayed.   BMET Recent Labs    12/05/21 1318  NA 142  K 4.3  CL 118*  CO2 20*  GLUCOSE 106*  BUN 76*  CREATININE 1.47*  CALCIUM 9.5   LFT Recent Labs    12/05/21 1318  PROT 7.0  ALBUMIN 3.5  AST 9*  ALT 7  ALKPHOS 57  BILITOT 0.5   PT/INR No results for input(s): "LABPROT", "INR" in the last 72 hours.  STUDIES: No results found.    Impression / Plan:   LEELYND MALDONADO is a 76 y.o. y/o male with history of alcoholic liver cirrhosis who follows with Dr. Alice Reichert at [facility] underwent an  EGD and colonoscopy few weeks back and was found to have AVMs of the stomach which were treated with APC.  Colonoscopy showed no abnormalities.  Presented with melena.  Drop in hemoglobin of over 3 g from baseline.  In addition had developed AKI.  Differentials include AVMs of the stomach small bowel.Last dose of plaviox was on Tuesday   Plan 1.  Monitor CBC and transfuse as needed IV PPI 2.  Push enteroscopy once off Plavix for atleast 3 days ,and if negative will require capsule study of the small bowel. 3. IF Hb stable later today can commence on clears.   Thank you for involving me in the care of this patient.      LOS: 1 day   [name], MD  12/06/2021, 9:37 AM

## 2021-12-06 NOTE — Plan of Care (Signed)

## 2021-12-06 NOTE — Progress Notes (Signed)
Progress Note   Patient: Samuel Carney ASN:[id number] DOB: 1945-08-24 DOA: 12/05/2021     1 DOS: the patient was seen and examined on 12/06/2021   Brief hospital course: Taken from H&P.  Samuel Carney is a 76 y.o. 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  He denies lightheadedness, chest pain, palpitations or shortness of breath.  Denies prior history of GI bleed. ED course and data review: On arrival BP 107/57 with pulse of 65, temp 99 and otherwise normal vitals.  Hemoglobin 8.1-7.5, down from 11.4 a year ago.  Creatinine 1.47 up from baseline of 1.16.  Urinalysis unremarkable..  EKG, personally reviewed and interpreted showing sinus rhythm at 60 with no acute ST-T wave changes. Patient typed and crossed, started on octreotide, pantoprazole. GI was consulted.  9/6: Hemoglobin improved to 8.4 after getting 1 unit of PRBC. GI will repeat EGD 3 days after stopping Plavix.  If no obvious source found then they might do capsule endoscopy. Did not had any bowel movement since this morning.   Assessment and Plan: * Melena Acute blood loss anemia History of gastric varices and gastric AVM s/p argon plasma coagulation on 8/30 Patient presents with melena with hemoglobin 8.1-7.5 down from 11 a year ago Continue octreotide infusion, Protonix infusion and continue Rocephin S/p 1 unit of PRBC, hemoglobin at 8.4 this morning. GI to do EGD after 3 days of stopping Plavix. -Continue to monitor  AKI (acute kidney injury)  ([place]) Creatinine 1.47, up from baseline of 1.16 a year ago, likely secondary to hypovolemia from blood loss from multiple melanotic stools -IV hydration and monitor, avoid nephrotoxins  Benign essential hypertension Normotensive.  - Hold antihypertensives of amlodipine and metoprolol and telmisartan   CAD (coronary artery disease) No complaints of chest pain.  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  Followed by cardiology -Continue to monitor  Alcoholic cirrhosis of liver without ascites ([place]) Not decompensated at this time  Thrombocytopenia (HCC) Mild thrombocytopenia of 139,000, likely secondary to cirrhosis Continue to monitor  COPD (chronic obstructive pulmonary disease) ([place]) Not exacerbated DuoNebs as needed  Malnutrition of moderate degree Estimated body mass index is 21.51 kg/m as calculated from the following:   Height as of this encounter: 5\' 7"  (1.702 m).   Weight as of this encounter: 62.3 kg.   -Dietitian consult  Hyperlipidemia, mixed - Continue statin     Subjective: Patient was seen and examined today.  Did not had any bowel movement since this morning.  [name] at bedside.  Physical Exam: Vitals:   12/06/21 0359 12/06/21 0440 12/06/21 1549 12/06/21 1645  BP: 123/67 119/67 128/69 134/62  Pulse: 70 71 64 61  Resp: 20 20 17 17   Temp: 98.2 F (36.8 C) 98.2 F (36.8 C) 98.6 F (37 C) 97.9 F (36.6 C)  TempSrc:  Oral    SpO2: 100% 100% 100% 100%  Weight:  Height:       General.  Malnourished gentleman, in no acute distress. Pulmonary.  Lungs clear bilaterally, normal respiratory effort. CV.  Regular rate and rhythm, no JVD, rub or murmur. Abdomen.  Soft, nontender, nondistended, BS positive. CNS.  Alert and oriented .  No  focal neurologic deficit. Extremities.  Left AKA, no edema on right Psychiatry.  Judgment and insight appears normal.  Data Reviewed: Prior data reviewed  Family Communication: Discussed with fianc at bedside  Disposition: Status is: Inpatient Remains inpatient appropriate because: Severity of illness.   Planned Discharge Destination: Home  Time spent: 45 minutes  This record has been created using Systems analyst. Errors have been sought and corrected,but may not always be located. Such creation errors do not reflect on the standard of care.  Author: Lorella Nimrod, MD 12/06/2021 5:43 PM  For on call review [email or web address].

## 2021-12-06 NOTE — Assessment & Plan Note (Signed)
Estimated body mass index is 21.51 kg/m as calculated from the following:   Height as of this encounter: 5\' 7"  (1.702 m).   Weight as of this encounter: 62.3 kg.   -Dietitian consult

## 2021-12-07 DIAGNOSIS — K921 Melena: Secondary | ICD-10-CM | POA: Diagnosis not present

## 2021-12-07 LAB — BASIC METABOLIC PANEL
Anion gap: 6 (ref 5–15)
BUN: 51 mg/dL — ABNORMAL HIGH (ref 8–23)
CO2: 19 mmol/L — ABNORMAL LOW (ref 22–32)
Calcium: 8.8 mg/dL — ABNORMAL LOW (ref 8.9–10.3)
Chloride: 121 mmol/L — ABNORMAL HIGH (ref 98–111)
Creatinine, Ser: 1.46 mg/dL — ABNORMAL HIGH (ref 0.61–1.24)
GFR, Estimated: 50 mL/min — ABNORMAL LOW (ref >60)
Glucose, Bld: 115 mg/dL — ABNORMAL HIGH (ref 70–99)
Potassium: 3.8 mmol/L (ref 3.5–5.1)
Sodium: 146 mmol/L — ABNORMAL HIGH (ref 135–145)

## 2021-12-07 LAB — CBC
HCT: 27.1 % — ABNORMAL LOW (ref 39.0–52.0)
Hemoglobin: 8.4 g/dL — ABNORMAL LOW (ref 13.0–17.0)
MCH: 26.2 pg (ref 26.0–34.0)
MCHC: 31 g/dL (ref 30.0–36.0)
MCV: 84.4 fL (ref 80.0–100.0)
Platelets: 102 10*3/uL — ABNORMAL LOW (ref 150–400)
RBC: 3.21 MIL/uL — ABNORMAL LOW (ref 4.22–5.81)
RDW: 15.3 % (ref 11.5–15.5)
WBC: 6.6 10*3/uL (ref 4.0–10.5)
nRBC: 0 % (ref 0.0–0.2)

## 2021-12-07 LAB — GLUCOSE, CAPILLARY: Glucose-Capillary: 94 mg/dL (ref 70–99)

## 2021-12-07 MED ORDER — LACTATED RINGERS IV SOLN: INTRAVENOUS | Status: AC

## 2021-12-07 MED ORDER — SODIUM CHLORIDE 0.9 % IV SOLN
2.0000 g | INTRAVENOUS | Status: DC
  Administered 2021-12-07: 2 g via INTRAVENOUS
  Filled 2021-12-07: qty 20
  Filled 2021-12-07: qty 2

## 2021-12-07 MED ORDER — POLYETHYLENE GLYCOL 3350 17 G PO PACK
17.0000 g | PACK | Freq: Every day | ORAL | Status: DC
  Administered 2021-12-07 – 2021-12-09 (×3): 17 g via ORAL
  Filled 2021-12-07 (×3): qty 1

## 2021-12-07 NOTE — Progress Notes (Signed)
Patient's blood sugar is 94.  Patient's informed consent for upper endoscopy signed and placed in patient's chart.

## 2021-12-07 NOTE — Progress Notes (Signed)
Samuel Lame, MD [facility]   [address], [place], [place] [address] Phone: [phone] Fax : [phone]   Subjective: Patient consulted for GI bleed.  The patient was seen by Dr. Vicente Males yesterday and had a previous EGD and colonoscopy by Dr. Alice Reichert last week.  There was some small rectal varices with an EGD showing AVMs that were treated.  The patient started having dark stools for 2 to 3 days and had a drop in his hemoglobin.  The patient's fianc today tells me that the patient had his Plavix on Tuesday.  His hemoglobin today is 8.4 with it being 8.5 yesterday.   Objective: Vital signs in last 24 hours: Vitals:   12/06/21 1935 12/06/21 2334 12/07/21 0400 12/07/21 0753  BP: 133/65 (!) 136/58 129/64 (!) 144/72  Pulse: 62 (!) 58 60 65  Resp: 19 18 20 16   Temp: 98.1 F (36.7 C) 98.2 F (36.8 C) 98.2 F (36.8 C) 97.7 F (36.5 C)  TempSrc: Oral Oral Oral   SpO2: 100% 99% 99% 100%  Weight:      Height:       Weight change:   Intake/Output Summary (Last 24 hours) at 12/07/2021 1117 Last data filed at 12/07/2021 0452 Gross per 24 hour  Intake 1657.93 ml  Output 950 ml  Net 707.93 ml     Exam: General: Patient laying in bed in no apparent distress with his lunch tray in front of him.   Lab Results: @LABTEST2 @ Micro Results: No results found for this or any previous visit (from the past 240 hour(s)). Studies/Results: No results found. Medications: I have reviewed the patient's current medications. Scheduled Meds:  (feeding supplement) PROSource Plus  30 mL Oral TID BM   sodium chloride   Intravenous Once   cyanocobalamin  1,000 mcg Intramuscular Q30 days   dorzolamide-timolol  1 drop Both Eyes BID   feeding supplement  1 Container Oral TID BM   multivitamin with minerals  1 tablet Oral Daily   [START ON 12/09/2021] pantoprazole  40 mg Intravenous Q12H   rosuvastatin  5 mg Oral Daily   Continuous Infusions:  cefTRIAXone (ROCEPHIN)  IV     lactated ringers      octreotide (SANDOSTATIN) 500 mcg in sodium chloride 0.9 % 250 mL (2 mcg/mL) infusion 50 mcg/hr (12/07/21 0315)   pantoprazole 8 mg/hr (12/07/21 0315)   PRN Meds:.acetaminophen **OR** acetaminophen, morphine injection, ondansetron **OR** ondansetron (ZOFRAN) IV   Assessment: Principal Problem:   Melena Active Problems:   Benign essential hypertension   CAD (coronary artery disease)   Lung cancer (HCC)   Alcoholic cirrhosis of 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)   Hyperlipidemia, mixed   Thrombocytopenia (HCC)   COPD (chronic obstructive pulmonary disease) (HCC)   Grade II diastolic dysfunction   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  The patient has a history of cirrhosis from alcohol.  The patient has been off Plavix for only 2 days and is a high risk of rebleeding with any endoscopic intervention.  The patient will be set up for a upper endoscopy tomorrow.  Patient has been explained the plan agrees with it.   LOS: 2 days   Samuel Carney, Marval Regal 12/07/2021, 11:17 AM Pager [phone] 7am-5pm  Check  AMION for 5pm -7am coverage and on weekends

## 2021-12-07 NOTE — Progress Notes (Signed)
Progress Note   Patient: Samuel Carney WER:[id number] DOB: Aug 16, 1945 DOA: 12/05/2021     2 DOS: the patient was seen and examined on 12/07/2021   Brief hospital course: Taken from H&P.  Samuel Carney is a 76 y.o. 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  He denies lightheadedness, chest pain, palpitations or shortness of breath.  Denies prior history of GI bleed. ED course and data review: On arrival BP 107/57 with pulse of 65, temp 99 and otherwise normal vitals.  Hemoglobin 8.1-7.5, down from 11.4 a year ago.  Creatinine 1.47 up from baseline of 1.16.  Urinalysis unremarkable..  EKG, personally reviewed and interpreted showing sinus rhythm at 60 with no acute ST-T wave changes. Patient typed and crossed, started on octreotide, pantoprazole. GI was consulted.  9/6: Hemoglobin improved to 8.4 after getting 1 unit of PRBC. GI will repeat EGD 3 days after stopping Plavix.  If no obvious source found then they might do capsule endoscopy. Did not had any bowel movement since this morning.  9/7: Hemodynamically stable with mild worsening of metabolic acidosis with bicarb of 19, sodium at 146.  Renal function seems stable but still elevated above baseline.  Giving gentle IV fluid for the next 20 hours.  Hemoglobin seems stable.  Did not had any more bowel movements.  Per patient and fianc last Plavix dose was on Tuesday.  Going for EGD tomorrow   Assessment and Plan: * Melena Acute blood loss anemia History of gastric  varices and gastric AVM s/p argon plasma coagulation on 8/30 Patient presents with melena with hemoglobin 8.1-7.5 down from 11 a year ago Continue octreotide infusion, Protonix infusion and continue Rocephin S/p 1 unit of PRBC, hemoglobin at 8.4 this morning.  No more active bleeding Going for EGD tomorrow -Continue to monitor  AKI (acute kidney injury) ([place]) Creatinine 1.47, up from baseline of 1.16 a year ago, likely secondary to hypovolemia from blood loss from multiple melanotic stools -IV hydration and monitor, avoid nephrotoxins  Benign essential hypertension Normotensive.  - Hold antihypertensives of amlodipine and metoprolol and telmisartan   CAD (coronary artery disease) No complaints of chest pain.  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  Followed by cardiology -Continue to monitor  Alcoholic cirrhosis of liver without ascites ([place]) Not decompensated at this time  Thrombocytopenia (HCC) Mild thrombocytopenia of 139,000, likely secondary to cirrhosis Continue to monitor  COPD (chronic obstructive pulmonary disease) ([place]) Not exacerbated DuoNebs as needed  Malnutrition of moderate degree Estimated body mass index is 21.51 kg/m as calculated from the following:   Height as of this encounter: 5\' 7"  (1.702 m).   Weight as of this encounter: 62.3 kg.   -Dietitian consult  Hyperlipidemia, mixed - Continue statin        Subjective: Patient denies any pain or bowel movement.  Denies any hematemesis.  Physical Exam: Vitals:   12/07/21  0400 12/07/21 0753 12/07/21 1138 12/07/21 1542  BP: 129/64 (!) 144/72 (!) 152/76 (!) 153/67  Pulse: 60 65 64 (!) 58  Resp: 20 16 17 16   Temp: 98.2 F (36.8 C) 97.7 F (36.5 C) 98.3 F (36.8 C) 98.1 F (36.7  C)  TempSrc: Oral  Oral   SpO2: 99% 100% 100% 100%  Weight:      Height:       General.  Malnourished gentleman, in no acute distress. Pulmonary.  Lungs clear bilaterally, normal respiratory effort. CV.  Regular rate and rhythm, no JVD, rub or murmur. Abdomen.  Soft, nontender, nondistended, BS positive. CNS.  Alert and oriented .  No focal neurologic deficit. Extremities.  Left AKA.  No edema Psychiatry.  Judgment and insight appears normal.  Data Reviewed: Prior data reviewed  Family Communication: [name] at bedside  Disposition: Status is: Inpatient Remains inpatient appropriate because: Severity of illness, going for EGD tomorrow   Planned Discharge Destination: Home  Time spent: 40 minutes  This record has been created using Systems analyst. Errors have been sought and corrected,but may not always be located. Such creation errors do not reflect on the standard of care.  Author: Lorella Nimrod, MD 12/07/2021 4:34 PM  For on call review [email or web address].

## 2021-12-07 NOTE — TOC CM/SW Note (Signed)
  Transition of Care [facility]) Screening Note   Patient Details  Name: Samuel Carney Date of Birth: 11-21-1945   Transition of Care [facility]) CM/SW Contact:    Candie Chroman, LCSW Phone Number: 12/07/2021, 9:56 AM    Transition of Care Department [facility]) has reviewed patient and no TOC needs have been identified at this time. We will continue to monitor patient advancement through interdisciplinary progression rounds. If new patient transition needs arise, please place a TOC consult.

## 2021-12-07 NOTE — Assessment & Plan Note (Signed)
Creatinine 1.47, up from baseline of 1.16 a year ago, likely secondary to hypovolemia from blood loss from multiple melanotic stools -IV hydration and monitor, avoid nephrotoxins

## 2021-12-07 NOTE — Assessment & Plan Note (Addendum)
Acute blood loss anemia History of gastric varices and gastric AVM s/p argon plasma coagulation on 8/30 Patient presents with melena with hemoglobin 8.1-7.5 down from 11 a year ago Continue octreotide infusion, Protonix infusion and continue Rocephin S/p 1 unit of PRBC, hemoglobin at 8.4 this morning.  No more active bleeding Going for EGD tomorrow -Continue to monitor

## 2021-12-08 ENCOUNTER — Inpatient Hospital Stay: Payer: Medicare Other | Admitting: Anesthesiology

## 2021-12-08 ENCOUNTER — Encounter: Payer: Self-pay | Admitting: Gastroenterology

## 2021-12-08 ENCOUNTER — Encounter
Admission: EM | Disposition: A | Discharge status: Home / Self Care | Payer: Self-pay | Source: Home / Self Care | Attending: Internal Medicine

## 2021-12-08 DIAGNOSIS — K921 Melena: Secondary | ICD-10-CM | POA: Diagnosis not present

## 2021-12-08 DIAGNOSIS — K922 Gastrointestinal hemorrhage, unspecified: Principal | ICD-10-CM

## 2021-12-08 HISTORY — PX: ESOPHAGOGASTRODUODENOSCOPY (EGD) WITH PROPOFOL: SHX5813

## 2021-12-08 LAB — CBC
HCT: 24.3 % — ABNORMAL LOW (ref 39.0–52.0)
Hemoglobin: 7.7 g/dL — ABNORMAL LOW (ref 13.0–17.0)
MCH: 26.7 pg (ref 26.0–34.0)
MCHC: 31.7 g/dL (ref 30.0–36.0)
MCV: 84.4 fL (ref 80.0–100.0)
Platelets: 108 10*3/uL — ABNORMAL LOW (ref 150–400)
RBC: 2.88 MIL/uL — ABNORMAL LOW (ref 4.22–5.81)
RDW: 15.1 % (ref 11.5–15.5)
WBC: 7.1 10*3/uL (ref 4.0–10.5)
nRBC: 0 % (ref 0.0–0.2)

## 2021-12-08 LAB — BPAM RBC
Blood Product Expiration Date: 202310112359
ISSUE DATE / TIME: 202309060128
Unit Type and Rh: 5100

## 2021-12-08 LAB — TYPE AND SCREEN
ABO/RH(D): O POS
Antibody Screen: NEGATIVE
Unit division: 0

## 2021-12-08 LAB — BASIC METABOLIC PANEL
Anion gap: 5 (ref 5–15)
BUN: 30 mg/dL — ABNORMAL HIGH (ref 8–23)
CO2: 19 mmol/L — ABNORMAL LOW (ref 22–32)
Calcium: 8.7 mg/dL — ABNORMAL LOW (ref 8.9–10.3)
Chloride: 118 mmol/L — ABNORMAL HIGH (ref 98–111)
Creatinine, Ser: 1.18 mg/dL (ref 0.61–1.24)
GFR, Estimated: >60 mL/min (ref >60)
Glucose, Bld: 110 mg/dL — ABNORMAL HIGH (ref 70–99)
Potassium: 3.9 mmol/L (ref 3.5–5.1)
Sodium: 142 mmol/L (ref 135–145)

## 2021-12-08 SURGERY — ESOPHAGOGASTRODUODENOSCOPY (EGD) WITH PROPOFOL
Anesthesia: General

## 2021-12-08 MED ORDER — PROPOFOL 10 MG/ML IV BOLUS
INTRAVENOUS | Status: DC | PRN
  Administered 2021-12-08: 10 mg via INTRAVENOUS
  Administered 2021-12-08: 20 mg via INTRAVENOUS
  Administered 2021-12-08: 10 mg via INTRAVENOUS
  Administered 2021-12-08: 60 mg via INTRAVENOUS
  Administered 2021-12-08 (×2): 20 mg via INTRAVENOUS

## 2021-12-08 MED ORDER — GLYCOPYRROLATE 0.2 MG/ML IJ SOLN
INTRAMUSCULAR | Status: DC | PRN
  Administered 2021-12-08: .2 mg via INTRAVENOUS

## 2021-12-08 MED ORDER — DEXMEDETOMIDINE (PRECEDEX) IN NS 20 MCG/5ML (4 MCG/ML) IV SYRINGE
PREFILLED_SYRINGE | INTRAVENOUS | Status: DC | PRN
  Administered 2021-12-08: 8 ug via INTRAVENOUS

## 2021-12-08 MED ORDER — LIDOCAINE HCL (CARDIAC) PF 100 MG/5ML IV SOSY
PREFILLED_SYRINGE | INTRAVENOUS | Status: DC | PRN
  Administered 2021-12-08: 60 mg via INTRAVENOUS

## 2021-12-08 MED ORDER — PANTOPRAZOLE SODIUM 40 MG PO TBEC
40.0000 mg | DELAYED_RELEASE_TABLET | Freq: Two times a day (BID) | ORAL | Status: DC
Start: 2021-12-08 — End: 2021-12-08

## 2021-12-08 MED ORDER — PANTOPRAZOLE SODIUM 40 MG PO TBEC
40.0000 mg | DELAYED_RELEASE_TABLET | Freq: Every day | ORAL | Status: DC
  Administered 2021-12-08 – 2021-12-09 (×2): 40 mg via ORAL
  Filled 2021-12-08 (×2): qty 1

## 2021-12-08 MED ORDER — CIPROFLOXACIN HCL 500 MG PO TABS
500.0000 mg | ORAL_TABLET | Freq: Every day | ORAL | Status: DC
  Administered 2021-12-08 – 2021-12-09 (×2): 500 mg via ORAL
  Filled 2021-12-08 (×3): qty 1

## 2021-12-08 NOTE — H&P (Signed)
Samuel Bellows, MD [address], [place], [place], [place], [address] [address], [place], [place], [place], [address] Phone: [phone]  Fax: [phone]  Primary Care Physician:  Juluis Pitch, MD   Pre-Procedure History & Physical: HPI:  Samuel Carney is a 76 y.o. male is here for an endoscopy    Past Medical History:  Diagnosis Date   Alcohol abuse    Aortic atherosclerosis ([place])    B12 deficiency    Cirrhosis ([place])    COPD (chronic obstructive pulmonary disease) ([place])    Coronary artery disease    GERD (gastroesophageal reflux disease)    Glaucoma    Grade II diastolic dysfunction    History of kidney stones    HLD (hyperlipidemia)    Hx of radiation therapy    Hypertension    Lung mass    Moderate mitral regurgitation    Pneumonia    PVD (peripheral vascular disease) ([place])    Squamous cell carcinoma of lung, right ([place]) 2019    Past Surgical History:  Procedure Laterality Date   COLONOSCOPY WITH PROPOFOL     COLONOSCOPY WITH PROPOFOL N/A 11/25/2018   Procedure: COLONOSCOPY WITH PROPOFOL;  Surgeon: Lollie Sails, MD;  Location: [facility] ENDOSCOPY;  Service: Endoscopy;  Laterality: N/A;   COLONOSCOPY WITH PROPOFOL N/A 11/29/2021   Procedure: COLONOSCOPY WITH PROPOFOL;  Surgeon: Toledo, Benay Pike, MD;  Location: [facility] ENDOSCOPY;  Service: Gastroenterology;  Laterality: N/A;   ENDARTERECTOMY FEMORAL Left 07/13/2020   Procedure: ENDARTERECTOMY FEMORAL ( SFA STENT);  Surgeon: Katha Cabal, MD;  Location: [facility] ORS;  Service: Vascular;  Laterality: Left;   ESOPHAGOGASTRODUODENOSCOPY (EGD) WITH PROPOFOL N/A 11/25/2018   Procedure: ESOPHAGOGASTRODUODENOSCOPY (EGD) WITH PROPOFOL;  Surgeon: Lollie Sails, MD;  Location: [facility] ENDOSCOPY;  Service: Endoscopy;  Laterality: N/A;   ESOPHAGOGASTRODUODENOSCOPY (EGD) WITH PROPOFOL N/A 11/29/2021   Procedure: ESOPHAGOGASTRODUODENOSCOPY (EGD) WITH PROPOFOL;  Surgeon: Toledo, Benay Pike, MD;  Location: [facility]  ENDOSCOPY;  Service: Gastroenterology;  Laterality: N/A;   LEG AMPUTATION ABOVE KNEE Left    LOWER EXTREMITY ANGIOGRAPHY Left 06/07/2020   Procedure: LOWER EXTREMITY ANGIOGRAPHY;  Surgeon: Katha Cabal, MD;  Location: [place] CV LAB;  Service: Cardiovascular;  Laterality: Left;   LOWER EXTREMITY ANGIOGRAPHY Left 01/10/2021   Procedure: LOWER EXTREMITY ANGIOGRAPHY;  Surgeon: Katha Cabal, MD;  Location: [place] CV LAB;  Service: Cardiovascular;  Laterality: Left;    Prior to Admission medications   Medication Sig Start Date End Date Taking? Authorizing Provider  acetaminophen (TYLENOL) 500 MG tablet Take 1,000 mg by mouth every 6 (six) hours as needed for moderate pain or mild pain.    [provider]  amLODipine (NORVASC) 10 MG tablet Take 1 tablet (10 mg total) by mouth daily. 12/13/20   Minna Merritts, MD  aspirin EC 81 MG EC tablet Take 1 tablet (81 mg total) by mouth daily at 6 (six) AM. Swallow whole. 07/15/20   Stegmayer, Janalyn Harder, PA-C  clopidogrel (PLAVIX) 75 MG tablet Take 1 tablet (75 mg total) by mouth daily. 12/13/20   Minna Merritts, MD  cyanocobalamin (,VITAMIN B-12,) 1000 MCG/ML injection Inject 1,000 mcg into the muscle every 30 (thirty) days. Patient not taking: Reported on 12/07/2021 01/12/19   [provider]  dorzolamide-timolol (COSOPT) 22.3-6.8 MG/ML ophthalmic solution Place 1 drop into both eyes 2 (two) times daily. 09/10/17   [provider]  doxazosin (CARDURA) 2 MG tablet Take 1 tablet by mouth once daily 09/12/21  Minna Merritts, MD  gabapentin (NEURONTIN) 300 MG capsule Take 1 capsule (300 mg total) by mouth at bedtime. 01/18/21   Kris Hartmann, NP  metoprolol tartrate (LOPRESSOR) 100 MG tablet Take 1 tablet (100 mg total) by mouth 2 (two) times daily. 12/13/20   Minna Merritts, MD  pantoprazole (PROTONIX) 40 MG tablet Take 40 mg by mouth daily.    [provider]  rosuvastatin (CRESTOR) 5 MG tablet  Take 1 tablet (5 mg total) by mouth daily. 12/13/20   Minna Merritts, MD  telmisartan (MICARDIS) 80 MG tablet Take 1 tablet (80 mg total) by mouth daily. 12/13/20   Minna Merritts, MD  Travoprost, BAK Free, (TRAVATAN) 0.004 % SOLN ophthalmic solution Place 1 drop into both eyes at bedtime. 05/22/16   [provider]  varenicline (CHANTIX) 1 MG tablet Take 1 mg by mouth daily. 05/19/21   [provider]    Allergies as of 12/05/2021   (No Known Allergies)    Family History  Problem Relation Age of Onset   Diabetes Mellitus II Mother    Stroke Brother    Diabetes Mellitus II Brother    Diabetes Mellitus II Maternal Uncle     Social History   Socioeconomic History   Marital status: Divorced    Spouse name: Not on file   Number of children: Not on file   Years of education: Not on file   Highest education level: Not on file  Occupational History   Not on file  Tobacco Use   Smoking status: Every Day    Packs/day: 1.00    Years: 50.00    Total pack years: 50.00    Types: Cigarettes   Smokeless tobacco: Never   Tobacco comments:    01/23/21 cut back to about 3/4 of a pack per day per patient     05/22/2021 half pack a day  Vaping Use   Vaping Use: Never used  Substance and Sexual Activity   Alcohol use: Not Currently    Comment: "1 pint of liquor per day"; 01/23/21 about 6oz per day   Drug use: Never   Sexual activity: Yes  Other Topics Concern   Not on file  Social History Narrative   Live in girl friend   Social Determinants of Health   Financial Resource Strain: Not on file  Food Insecurity: Unknown (12/06/2021)   Hunger Vital Sign    Worried About Charity fundraiser in the Last Year: Patient refused    Ran Out of Food in the Last Year: Patient refused  Transportation Needs: Not on file  Physical Activity: Not on file  Stress: Not on file  Social Connections: Not on file  Intimate Partner Violence: Not on file    Review of Systems: See  HPI, otherwise negative ROS  Physical Exam: BP (!) 159/76   Pulse (!) 59   Temp (!) 97.1 F (36.2 C) (Temporal)   Resp 20   Ht 5\' 7"  (1.702 m)   Wt 62.3 kg   SpO2 100%   BMI 21.51 kg/m  General:   Alert,  pleasant and cooperative in NAD Head:  Normocephalic and atraumatic. Neck:  Supple; no masses or thyromegaly. Lungs:  Clear throughout to auscultation, normal respiratory effort.    Heart:  +S1, +S2, Regular rate and rhythm, No edema. Abdomen:  Soft, nontender and nondistended. Normal bowel sounds, without guarding, and without rebound.   Neurologic:  Alert and  oriented x4;  grossly  normal neurologically.  Impression/Plan: Samuel Carney is here for a push enteroscopy to be performed for  evaluation of gi bleed    Risks, benefits, limitations, and alternatives regarding endoscopy have been reviewed with the patient.  Questions have been answered.  All parties agreeable.   Samuel Bellows, MD  12/08/2021, 10:32 AM

## 2021-12-08 NOTE — Assessment & Plan Note (Signed)
Acute blood loss anemia History of gastric varices and gastric AVM s/p argon plasma coagulation on 8/30 Patient presents with melena with hemoglobin 8.1-7.5 down from 11 a year ago Patient was started on octreotide and Protonix EGD with nonbleeding angiectasia's in stomach and duodenum s/p argon plasma for hemostasis. Hemoglobin decreased to 7.7 this morning. -Continue to monitor-if remains stable then can be discharged tomorrow.

## 2021-12-08 NOTE — Op Note (Signed)
[facility] Patient Name: Samuel Carney Procedure Date: 12/08/2021 10:32 AM MRN: [id number] Account #: [id number] Date of Birth: 03-Jun-1945 Admit Type: Inpatient Age: 76 Room: [facility] ENDO ROOM 4 Gender: Male Note Status: Finalized Instrument Name: Peds Colonoscope [id number] Procedure:             Small bowel enteroscopy Indications:           Melena Providers:             [name] MD, MD Referring MD:          Youlanda Roys. Lovie Macadamia, MD (Referring MD) Medicines:             Monitored Anesthesia Care Complications:         No immediate complications. Procedure:             Pre-Anesthesia Assessment:                        - Prior to the procedure, a History and Physical was                         performed, and patient medications, allergies and                         sensitivities were reviewed. The patient's tolerance                         of previous anesthesia was reviewed.                        - The risks and benefits of the procedure and the                         sedation options and risks were discussed with the                         patient. All questions were answered and informed                         consent was obtained.                        - ASA Grade Assessment: III - A patient with severe                         systemic disease.                        After obtaining informed consent, the endoscope was                         passed under direct vision. Throughout the procedure,                         the patient's blood pressure, pulse, and oxygen                         saturations were monitored continuously. The                         Colonoscope was introduced  through the mouth, and                         advanced to the proximal jejunum. The small bowel                         enteroscopy was accomplished with ease. The patient                         tolerated the procedure well. Findings:      The esophagus was  normal.      Five small angioectasias with no bleeding were found on the greater       curvature of the stomach. Coagulation for bleeding prevention using       argon plasma at 1 liter/minute and 45 watts was successful.      Three angioectasias with no bleeding were found in the third portion of       the duodenum. Coagulation for bleeding prevention using argon plasma at       1 liter/minute and 45 watts was successful.      There was no evidence of significant pathology in the entire examined       portion of jejunum. Impression:            - Normal esophagus.                        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                         Treated with argon plasma coagulation (APC).                        - Three non-bleeding angioectasias in the duodenum.                         Treated with argon plasma coagulation (APC).                        - The examined portion of the jejunum was normal.                        - No specimens collected. Recommendation:        - Return patient to hospital ward for ongoing care.                        - Commence on clears                        Stop octreotide                        Continue oral ppi 40 mg omeprazole once daily long term                        Follow up with Dr Alice Reichert as an outpatient to decide                         about a capsule study of small bowel  Home tomorrow if has no bleedingt and hb stable Procedure Code(s):     --- Professional ---                        [phone], Small intestinal endoscopy, enteroscopy beyond                         second portion of duodenum, not including ileum; with                         control of bleeding (eg, injection, bipolar cautery,                         unipolar cautery, laser, heater probe, stapler, plasma                         coagulator) Diagnosis Code(s):     --- Professional ---                        K31.819, Angiodysplasia of stomach and  duodenum                         without bleeding                        K92.1, Melena (includes Hematochezia) CPT copyright 2019 American Medical Association. All rights reserved. The codes documented in this report are preliminary and upon coder review may  be revised to meet current compliance requirements. [name], MD [name] MD, MD 12/08/2021 11:00:42 AM This report has been signed electronically. Number of Addenda: 0 Note Initiated On: 12/08/2021 10:32 AM Estimated Blood Loss:  Estimated blood loss: none.      [facility]

## 2021-12-08 NOTE — Progress Notes (Signed)
Progress Note   Patient: Samuel Carney ZOX:[id number] DOB: 1946/03/29 DOA: 12/05/2021     3 DOS: the patient was seen and examined on 12/08/2021   Brief hospital course: Taken from H&P.  Samuel Carney is a 76 y.o. 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  He denies lightheadedness, chest pain, palpitations or shortness of breath.  Denies prior history of GI bleed. ED course and data review: On arrival BP 107/57 with pulse of 65, temp 99 and otherwise normal vitals.  Hemoglobin 8.1-7.5, down from 11.4 a year ago.  Creatinine 1.47 up from baseline of 1.16.  Urinalysis unremarkable..  EKG, personally reviewed and interpreted showing sinus rhythm at 60 with no acute ST-T wave changes. Patient typed and crossed, started on octreotide, pantoprazole. GI was consulted.  9/6: Hemoglobin improved to 8.4 after getting 1 unit of PRBC. GI will repeat EGD 3 days after stopping Plavix.  If no obvious source found then they might do capsule endoscopy. Did not had any bowel movement since this morning.  9/7: Hemodynamically stable with mild worsening of metabolic acidosis with bicarb of 19, sodium at 146.  Renal function seems stable but still elevated above baseline.  Giving gentle IV fluid for the next 20 hours.  Hemoglobin seems stable.  Did not had any more bowel movements.  Per patient and fianc last Plavix dose was on Tuesday.  Going for EGD tomorrow.  9/8: Patient had his EGD today, found to have 5 nonbleeding angiectasia's in  stomach and 3 in duodenum which were treated with argon plasma.  Octreotide was discontinued and GI is recommending p.o. twice daily Protonix and outpatient follow-up. GI would like to keep for another night for observation and if hemoglobin remains stable discharge tomorrow.   Assessment and Plan: * Melena Acute blood loss anemia History of gastric varices and gastric AVM s/p argon plasma coagulation on 8/30 Patient presents with melena with hemoglobin 8.1-7.5 down from 11 a year ago Patient was started on octreotide and Protonix EGD with nonbleeding angiectasia's in stomach and duodenum s/p argon plasma for hemostasis. Hemoglobin decreased to 7.7 this morning. -Continue to monitor-if remains stable then can be discharged tomorrow.  AKI (acute kidney injury) ([place]) Improved with IV hydration.  Creatinine at 1.18 today. -Continue to monitor -Avoid nephrotoxins  Benign essential hypertension Normotensive.  - Hold antihypertensives of amlodipine and metoprolol and telmisartan   CAD (coronary artery disease) No complaints of chest pain.  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  Followed by cardiology -Continue to monitor  Alcoholic cirrhosis of liver without ascites ([place]) Not decompensated at this time  Thrombocytopenia (HCC) Mild thrombocytopenia of 139,000, likely secondary to cirrhosis Continue to monitor  COPD (chronic obstructive pulmonary disease) ([place]) Not exacerbated DuoNebs as needed  Malnutrition of moderate degree Estimated body mass index is 21.51 kg/m as calculated from the following:   Height as of this  encounter: 5\' 7"  (1.702 m).   Weight as of this encounter: 62.3 kg.   -Dietitian consult  Hyperlipidemia, mixed - Continue statin      Subjective: Patient was seen and examined after the EGD today.  No more melena.  Physical Exam: Vitals:   12/08/21 0747 12/08/21 0948 12/08/21 1059 12/08/21 1147  BP: (!) 144/72 (!) 159/76 (!) 103/50 122/77  Pulse: (!) 59 (!) 59  72  Resp: 18 20  19   Temp: 98 F (36.7 C) (!) 97.1 F (36.2 C) (!) 96.5 F (35.8 C) (!) 97.5 F (36.4 C)  TempSrc:  Temporal Temporal Oral  SpO2: 100% 100% 98% 100%  Weight:      Height:       General.  Malnourished gentleman, in no acute distress. Pulmonary.  Lungs clear bilaterally, normal respiratory effort. CV.  Regular rate and rhythm, no JVD, rub or murmur. Abdomen.  Soft, nontender, nondistended, BS positive. CNS.  Alert and oriented .  No focal neurologic deficit. Extremities.  Left AKA Psychiatry.  Judgment and insight appears normal.  Data Reviewed: Prior data reviewed  Family Communication:   Disposition: Status is: Inpatient Remains inpatient appropriate because: Severity of illness   Planned Discharge Destination: Home  Time spent: 43 minutes  This record has been created using Systems analyst. Errors have been sought and corrected,but may not always be located. Such creation errors do not reflect on the standard of care.  Author: Lorella Nimrod, MD 12/08/2021 1:06 PM  For on call review [email or web address].

## 2021-12-08 NOTE — Assessment & Plan Note (Signed)
Normotensive.  - Hold antihypertensives of amlodipine and metoprolol and telmisartan

## 2021-12-08 NOTE — Assessment & Plan Note (Signed)
Improved with IV hydration.  Creatinine at 1.18 today. -Continue to monitor -Avoid nephrotoxins

## 2021-12-08 NOTE — Anesthesia Preprocedure Evaluation (Signed)
Anesthesia Evaluation  Patient identified by MRN, date of birth, ID band Patient awake    Reviewed: Allergy & Precautions, NPO status , Patient's Chart, lab work & pertinent test results  History of Anesthesia Complications Negative for: history of anesthetic complications  Airway Mallampati: III  TM Distance: >3 FB Neck ROM: full    Dental  (+) Chipped, Poor Dentition, Missing   Pulmonary COPD, Current Smoker and Patient abstained from smoking.,    Pulmonary exam normal        Cardiovascular hypertension, (-) angina+ CAD  Normal cardiovascular exam     Neuro/Psych negative neurological ROS  negative psych ROS   GI/Hepatic Neg liver ROS, GERD  Controlled,  Endo/Other  negative endocrine ROS  Renal/GU Renal disease  negative genitourinary   Musculoskeletal   Abdominal   Peds  Hematology negative hematology ROS (+)   Anesthesia Other Findings Past Medical History: No date: Alcohol abuse No date: Aortic atherosclerosis (HCC) No date: B12 deficiency No date: Cirrhosis ([place]) No date: COPD (chronic obstructive pulmonary disease) (HCC) No date: Coronary artery disease No date: GERD (gastroesophageal reflux disease) No date: Glaucoma No date: Grade II diastolic dysfunction No date: History of kidney stones No date: HLD (hyperlipidemia) No date: Hx of radiation therapy No date: Hypertension No date: Lung mass No date: Moderate mitral regurgitation No date: Pneumonia No date: PVD (peripheral vascular disease) ([place]) 2019: Squamous cell carcinoma of lung, right (HCC)  Past Surgical History: No date: COLONOSCOPY WITH PROPOFOL 11/25/2018: COLONOSCOPY WITH PROPOFOL; N/A     Comment:  Procedure: COLONOSCOPY WITH PROPOFOL;  Surgeon:               Lollie Sails, MD;  Location: [facility] ENDOSCOPY;                Service: Endoscopy;  Laterality: N/A; 11/29/2021: COLONOSCOPY WITH PROPOFOL; N/A     Comment:  Procedure:  COLONOSCOPY WITH PROPOFOL;  Surgeon: Toledo,               Benay Pike, MD;  Location: [facility] ENDOSCOPY;  Service:               Gastroenterology;  Laterality: N/A; 07/13/2020: ENDARTERECTOMY FEMORAL; Left     Comment:  Procedure: ENDARTERECTOMY FEMORAL ( SFA STENT);                Surgeon: Katha Cabal, MD;  Location: [facility] ORS;                Service: Vascular;  Laterality: Left; 11/25/2018: ESOPHAGOGASTRODUODENOSCOPY (EGD) WITH PROPOFOL; N/A     Comment:  Procedure: ESOPHAGOGASTRODUODENOSCOPY (EGD) WITH               PROPOFOL;  Surgeon: Lollie Sails, MD;  Location:               [facility] ENDOSCOPY;  Service: Endoscopy;  Laterality: N/A; 11/29/2021: ESOPHAGOGASTRODUODENOSCOPY (EGD) WITH PROPOFOL; N/A     Comment:  Procedure: ESOPHAGOGASTRODUODENOSCOPY (EGD) WITH               PROPOFOL;  Surgeon: Toledo, Benay Pike, MD;  Location:               [facility] ENDOSCOPY;  Service: Gastroenterology;  Laterality:               N/A; No date: LEG AMPUTATION ABOVE KNEE; Left 06/07/2020: LOWER EXTREMITY ANGIOGRAPHY; Left     Comment:  Procedure: LOWER EXTREMITY ANGIOGRAPHY;  Surgeon:  Schnier, Dolores Lory, MD;  Location: [place] CV LAB;               Service: Cardiovascular;  Laterality: Left; 01/10/2021: LOWER EXTREMITY ANGIOGRAPHY; Left     Comment:  Procedure: LOWER EXTREMITY ANGIOGRAPHY;  Surgeon:               Katha Cabal, MD;  Location: [place] CV LAB;               Service: Cardiovascular;  Laterality: Left;  BMI    Body Mass Index: 21.51 kg/m      Reproductive/Obstetrics negative OB ROS                             Anesthesia Physical Anesthesia Plan  ASA: 3  Anesthesia Plan: General   Post-op Pain Management:    Induction: Intravenous  PONV Risk Score and Plan: Propofol infusion and TIVA  Airway Management Planned: Natural Airway and Nasal Cannula  Additional Equipment:   Intra-op Plan:   Post-operative Plan:    Informed Consent: I have reviewed the patients History and Physical, chart, labs and discussed the procedure including the risks, benefits and alternatives for the proposed anesthesia with the patient or authorized representative who has indicated his/her understanding and acceptance.     Dental Advisory Given  Plan Discussed with: Anesthesiologist, CRNA and Surgeon  Anesthesia Plan Comments: (Patient consented for risks of anesthesia including but not limited to:  - adverse reactions to medications - risk of airway placement if required - damage to eyes, teeth, lips or other oral mucosa - nerve damage due to positioning  - sore throat or hoarseness - Damage to heart, brain, nerves, lungs, other parts of body or loss of life  Patient voiced understanding.)        Anesthesia Quick Evaluation

## 2021-12-08 NOTE — Transfer of Care (Signed)
Immediate Anesthesia Transfer of Care Note  Patient: Samuel Carney  Procedure(s) Performed: ESOPHAGOGASTRODUODENOSCOPY (EGD) WITH PROPOFOL  Patient Location: PACU  Anesthesia Type:General  Level of Consciousness: awake, alert  and oriented  Airway & Oxygen Therapy: Patient Spontanous Breathing  Post-op Assessment: Report given to RN and Post -op Vital signs reviewed and stable  Post vital signs: Reviewed and stable  Last Vitals:  Vitals Value Taken Time  BP 103/50 12/08/21 1100  Temp 35.8 C 12/08/21 1059  Pulse 72 12/08/21 1100  Resp 19 12/08/21 1100  SpO2 97 % 12/08/21 1100  Vitals shown include unvalidated device data.  Last Pain:  Vitals:   12/08/21 1059  TempSrc: Temporal  PainSc: Asleep         Complications: No notable events documented.

## 2021-12-08 NOTE — Anesthesia Postprocedure Evaluation (Signed)
Anesthesia Post Note  Patient: Samuel Carney  Procedure(s) Performed: ESOPHAGOGASTRODUODENOSCOPY (EGD) WITH PROPOFOL  Patient location during evaluation: Endoscopy Anesthesia Type: General Level of consciousness: awake and alert Pain management: pain level controlled Vital Signs Assessment: post-procedure vital signs reviewed and stable Respiratory status: spontaneous breathing, nonlabored ventilation, respiratory function stable and patient connected to nasal cannula oxygen Cardiovascular status: blood pressure returned to baseline and stable Postop Assessment: no apparent nausea or vomiting Anesthetic complications: no   No notable events documented.   Last Vitals:  Vitals:   12/08/21 1059 12/08/21 1147  BP: (!) 103/50 122/77  Pulse:  72  Resp:  19  Temp: (!) 35.8 C (!) 36.4 C  SpO2: 98% 100%    Last Pain:  Vitals:   12/08/21 1147  TempSrc: Oral  PainSc:                  Precious Haws Khadija Thier

## 2021-12-08 NOTE — Care Management Important Message (Signed)
Important Message  Patient Details  Name: Samuel Carney MRN: [id number] Date of Birth: 1945/08/31   Medicare Important Message Given:  Yes     Dannette Barbara 12/08/2021, 2:49 PM

## 2021-12-08 NOTE — Progress Notes (Signed)
Patient is on a clear liquid diet but is currently NPO for pending EGD in the morning tentatively scheduled around 11 am. Patient has signed his consent form and copy placed in chart

## 2021-12-09 DIAGNOSIS — K921 Melena: Secondary | ICD-10-CM | POA: Diagnosis not present

## 2021-12-09 LAB — CBC
HCT: 24.3 % — ABNORMAL LOW (ref 39.0–52.0)
Hemoglobin: 7.7 g/dL — ABNORMAL LOW (ref 13.0–17.0)
MCH: 26.6 pg (ref 26.0–34.0)
MCHC: 31.7 g/dL (ref 30.0–36.0)
MCV: 84.1 fL (ref 80.0–100.0)
Platelets: 111 10*3/uL — ABNORMAL LOW (ref 150–400)
RBC: 2.89 MIL/uL — ABNORMAL LOW (ref 4.22–5.81)
RDW: 15.1 % (ref 11.5–15.5)
WBC: 7.5 10*3/uL (ref 4.0–10.5)
nRBC: 0 % (ref 0.0–0.2)

## 2021-12-09 MED ORDER — METOPROLOL TARTRATE 100 MG PO TABS: 100.0000 mg | ORAL_TABLET | Freq: Two times a day (BID) | ORAL | 3 refills | Status: AC

## 2021-12-09 MED ORDER — CLOPIDOGREL BISULFATE 75 MG PO TABS: 75.0000 mg | ORAL_TABLET | Freq: Every day | ORAL | 11 refills | Status: DC

## 2021-12-09 MED ORDER — FE FUMARATE-B12-VIT C-FA-IFC PO CAPS: 1.0000 | ORAL_CAPSULE | Freq: Two times a day (BID) | ORAL | 2 refills | Status: AC

## 2021-12-09 MED ORDER — FE FUMARATE-B12-VIT C-FA-IFC PO CAPS
1.0000 | ORAL_CAPSULE | Freq: Two times a day (BID) | ORAL | Status: DC
  Administered 2021-12-09: 1 via ORAL
  Filled 2021-12-09 (×2): qty 1

## 2021-12-09 MED ORDER — PANTOPRAZOLE SODIUM 40 MG PO TBEC: 40.0000 mg | DELAYED_RELEASE_TABLET | Freq: Two times a day (BID) | ORAL | 1 refills | Status: AC

## 2021-12-09 MED ORDER — POLYETHYLENE GLYCOL 3350 17 G PO PACK: 17.0000 g | PACK | Freq: Every day | ORAL | 0 refills | Status: AC

## 2021-12-09 NOTE — Discharge Summary (Signed)
Physician Discharge Summary   Patient: Samuel Carney: [id number] DOB: 06-14-1945  Admit date:     12/05/2021  Discharge date: 12/09/21  Discharge Physician: Samuel Carney   PCP: Samuel Pitch, MD   Recommendations at discharge:  Please obtain CBC and BMP in 1 week Please consider whether he need DAPT or can be just managed with 1 antiplatelet. We also held his metoprolol as his heart rate remained in 50s without it-please evaluate before restarting. Protonix dose was increased to twice daily. 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  Samuel Carney is a 76 y.o. 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  He denies lightheadedness, chest pain, palpitations or shortness of breath.  Denies prior history of GI bleed. ED course and data review: On arrival BP 107/57 with pulse of 65, temp 99 and  otherwise normal vitals.  Hemoglobin 8.1-7.5, down from 11.4 a year ago.  Creatinine 1.47 up from baseline of 1.16.  Urinalysis unremarkable..  EKG, personally reviewed and interpreted showing sinus rhythm at 60 with no acute ST-T wave changes. Patient typed and crossed, started on octreotide, pantoprazole. GI was consulted.  9/6: Hemoglobin improved to 8.4 after getting 1 unit of PRBC. GI will repeat EGD 3 days after stopping Plavix.  If no obvious source found then they might do capsule endoscopy. Did not had any bowel movement since this morning.  9/7: Hemodynamically stable with mild worsening of metabolic acidosis with bicarb of 19, sodium at 146.  Renal function seems stable but still elevated above baseline.  Giving gentle IV fluid for the next 20 hours.  Hemoglobin seems stable.  Did not had any more bowel movements.  Per patient and fianc last Plavix dose was on Tuesday.  Going for EGD tomorrow.  9/8: Patient had his EGD today, found to have 5 nonbleeding angiectasia's in stomach and 3 in duodenum which were treated with argon plasma.  Octreotide was discontinued and GI is recommending p.o. twice daily Protonix and outpatient follow-up. GI would like to keep for another night for observation and if hemoglobin remains stable discharge tomorrow.  9/9: Patient remained stable.  Did not had any more melena.  He was advised to resume his aspirin from tomorrow and keep holding Plavix for another 3 days.  Patient need to have a discussion with his vascular surgeon regarding the need of continuation of DAPT to decrease the risk of bleeding. We also held his home metoprolol due to heart rate  remaining 50s without it during hospitalization.  His PCP or cardiologist can decide when to restart at appropriate doses.  Patient will continue with the rest of his home medications and follow-up with his providers for further recommendations..  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 Hemoglobin decreased to 7.7 this morning. -Continue to monitor-if remains stable then can be discharged tomorrow.  AKI (acute kidney injury) ([place]) Improved with IV hydration.  Creatinine at 1.18 today. -Continue to monitor -Avoid nephrotoxins  Benign essential hypertension Normotensive.  - Hold antihypertensives of amlodipine and metoprolol and telmisartan   CAD (coronary artery disease) No complaints of chest pain.  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  Followed by cardiology -Continue to monitor  Alcoholic cirrhosis of liver without ascites ([place]) Not decompensated at this time  Thrombocytopenia (HCC) Mild thrombocytopenia of 139,000, likely secondary to cirrhosis Continue to monitor  COPD (chronic obstructive pulmonary disease) ([place]) Not exacerbated DuoNebs as needed  Malnutrition of moderate degree Estimated body mass index is 21.51 kg/m as calculated from the following:   Height as of this encounter: 5\' 7"  (1.702 m).   Weight as of this encounter: 62.3 kg.   -Dietitian consult  Hyperlipidemia, mixed - Continue statin         Consultants: Gastroenterology Procedures performed: EGD Disposition: Home Diet recommendation:  Discharge Diet Orders (From admission, onward)     Start     Ordered   12/09/21 0000  Diet - low sodium heart healthy        12/09/21 1125           Cardiac diet DISCHARGE MEDICATION: Allergies as of 12/09/2021    No Known Allergies      Medication List     STOP taking these medications    cyanocobalamin 1000 MCG/ML injection Commonly known as: VITAMIN B12       TAKE these medications    acetaminophen 500 MG tablet Commonly known as: TYLENOL Take 1,000 mg by mouth every 6 (six) hours as needed for moderate pain or mild pain.   amLODipine 10 MG tablet Commonly known as: NORVASC Take 1 tablet (10 mg total) by mouth daily.   aspirin EC 81 MG tablet Take 1 tablet (81 mg total) by mouth daily at 6 (six) AM. Swallow whole.   clopidogrel 75 MG tablet Commonly known as: Plavix Take 1 tablet (75 mg total) by mouth daily. Please hold for 3 days What changed: additional instructions   dorzolamide-timolol 22.3-6.8 MG/ML ophthalmic solution Commonly known as: COSOPT Place 1 drop into both eyes 2 (two) times daily.   doxazosin 2 MG tablet Commonly known as: CARDURA Take 1 tablet by mouth once daily   ferrous [id number] C-folic acid capsule Commonly known as: TRINSICON / FOLTRIN Take 1 capsule by mouth 2 (two) times daily after a meal.   gabapentin 300 MG capsule Commonly known as: NEURONTIN Take 1 capsule (300 mg total) by mouth at bedtime.   metoprolol tartrate 100 MG tablet Commonly known as: LOPRESSOR Take 1 tablet (100  mg total) by mouth 2 (two) times daily. Please hold metoprolol until you see your doctor What changed: additional instructions   pantoprazole 40 MG tablet Commonly known as: PROTONIX Take 1 tablet (40 mg total) by mouth 2 (two) times daily. What changed: when to take this   polyethylene glycol 17 g packet Commonly known as: MIRALAX / GLYCOLAX Take 17 g by mouth daily. Start taking on: December 10, 2021   rosuvastatin 5 MG tablet Commonly known as: CRESTOR Take 1 tablet (5 mg total) by mouth daily.   telmisartan 80 MG tablet Commonly known as: MICARDIS Take 1 tablet (80 mg total) by mouth daily.   Travoprost (BAK Free) 0.004 % Soln  ophthalmic solution Commonly known as: TRAVATAN Place 1 drop into both eyes at bedtime.   varenicline 1 MG tablet Commonly known as: CHANTIX Take 1 mg by mouth daily.        Follow-up Information     Samuel Pitch, MD. Schedule an appointment as soon as possible for a visit in 1 week(s).   Specialty: Family Medicine Contact information: [address] [address] [phone]         Minna Merritts, MD .   Specialty: Cardiology Contact information: [place] [address] [phone]                Discharge Exam: Danley Danker Weights   12/05/21 2300  Weight: 62.3 kg   General.  Malnourished gentleman, in no acute distress. Pulmonary.  Lungs clear bilaterally, normal respiratory effort. CV.  Regular rate and rhythm, no JVD, rub or murmur. Abdomen.  Soft, nontender, nondistended, BS positive. CNS.  Alert and oriented .  No focal neurologic deficit. Extremities.  Left AKA Psychiatry.  Judgment and insight appears normal.   Condition at discharge: stable  The results of significant diagnostics from this hospitalization (including imaging, microbiology, ancillary and laboratory) are listed below for reference.   Imaging Studies: No results found.  Microbiology: Results for orders placed or performed during the hospital encounter of 07/13/20  MRSA PCR Screening     Status: None   Collection Time: 07/13/20  2:41 PM   Specimen: Nasopharyngeal  Result Value Ref Range Status   MRSA by PCR NEGATIVE NEGATIVE Final    Comment:        The GeneXpert MRSA Assay (FDA approved for NASAL specimens only), is one component of a comprehensive MRSA colonization surveillance program. It is not intended to diagnose MRSA infection nor to guide or monitor treatment for MRSA infections. Performed at [facility], [address] [place], [place] [address]     Labs: CBC: Recent Labs  Lab 12/05/21 1318 12/05/21 2035  12/06/21 1255 12/06/21 1705 12/07/21 0323 12/08/21 0558 12/09/21 0419  WBC 7.5  --   --   --  6.6 7.1 7.5  HGB 8.1*   < > 8.9* 8.5* 8.4* 7.7* 7.7*  HCT 26.9*   < > 28.9* 27.3* 27.1* 24.3* 24.3*  MCV 85.4  --   --   --  84.4 84.4 84.1  PLT 139*  --   --   --  102* 108* 111*   < > = values in this interval not displayed.   Basic Metabolic Panel: Recent Labs  Lab 12/05/21 1318 12/07/21 0323 12/08/21 0558  NA 142 146* 142  K 4.3 3.8 3.9  CL 118* 121* 118*  CO2 20* 19* 19*  GLUCOSE 106* 115* 110*  BUN 76* 51*  30*  CREATININE 1.47* 1.46* 1.18  CALCIUM 9.5 8.8* 8.7*   Liver Function Tests: Recent Labs  Lab 12/05/21 1318  AST 9*  ALT 7  ALKPHOS 57  BILITOT 0.5  PROT 7.0  ALBUMIN 3.5   CBG: Recent Labs  Lab 12/07/21 2031  GLUCAP 94    Discharge time spent: greater than 30 minutes.  This record has been created using Systems analyst. Errors have been sought and corrected,but may not always be located. Such creation errors do not reflect on the standard of care.   Signed: Lorella Nimrod, MD [facility] 12/09/2021

## 2021-12-13 LAB — PREPARE RBC (CROSSMATCH)

## 2022-01-04 ENCOUNTER — Other Ambulatory Visit: Payer: Self-pay | Admitting: Cardiovascular Disease

## 2022-01-05 ENCOUNTER — Other Ambulatory Visit: Payer: Self-pay | Admitting: Cardiovascular Disease

## 2022-01-23 ENCOUNTER — Telehealth: Payer: Self-pay | Admitting: Cardiovascular Disease

## 2022-01-23 ENCOUNTER — Other Ambulatory Visit: Payer: Self-pay | Admitting: Gastroenterology

## 2022-01-23 ENCOUNTER — Other Ambulatory Visit (HOSPITAL_COMMUNITY): Payer: Self-pay | Admitting: Gastroenterology

## 2022-01-23 DIAGNOSIS — K7030 Alcoholic cirrhosis of liver without ascites: Secondary | ICD-10-CM

## 2022-01-23 NOTE — Telephone Encounter (Signed)
Pt c/o medication issue:  1. Name of Medication: Plavix-   2. How are you currently taking this medication (dosage and times per day)? 1 time a day  3. Are you having a reaction (difficulty breathing--STAT)?   4. What is your medication issue? Patient saw his GI doctor today, patient had been in the hospital for bleeding f The GI doctor told him to find out, if he was going to be on Plavix for a long time or would he be on it for a short term?

## 2022-01-23 NOTE — Telephone Encounter (Signed)
Patient was in hospital recently for GI bleed GI told him to follow up with MD who started Plavix Was off Plavix until PCP resumed  GI said everything good with bleed back on Plavix but having the cirrhosis could happen again  Needs to know if Plavix long term or short term   She called [facility] and nurse told her Dr Jacqualyn Posey didn't start Plavix   Will forward to Dr Rockey Situ for review

## 2022-01-25 ENCOUNTER — Other Ambulatory Visit: Payer: Self-pay | Admitting: Cardiovascular Disease

## 2022-01-25 NOTE — Telephone Encounter (Signed)
Rx refill sent to pharmacy. 

## 2022-01-26 ENCOUNTER — Other Ambulatory Visit (HOSPITAL_BASED_OUTPATIENT_CLINIC_OR_DEPARTMENT_OTHER): Payer: Self-pay

## 2022-01-26 NOTE — Telephone Encounter (Signed)
Call to patient who passed the phone to a person named "Massie Maroon."  Instructions provided as per Dr. Donivan Scull note to hold Plavix and ok to take aspirin.  Also, patient should schedule an appt to see Dr Delana Meyer for follow up since his procedure on his leg in 2022.  Massie Maroon provided readback of instructions as given.  Georgana Curio MHA RN CCM

## 2022-01-26 NOTE — Progress Notes (Signed)
See note 01/26/2022

## 2022-01-29 ENCOUNTER — Ambulatory Visit
Admission: RE | Admit: 2022-01-29 | Discharge: 2022-01-29 | Disposition: A | Discharge status: Home / Self Care | Payer: Medicare Other | Source: Ambulatory Visit | Attending: Gastroenterology | Admitting: Gastroenterology

## 2022-01-29 DIAGNOSIS — K7030 Alcoholic cirrhosis of liver without ascites: Secondary | ICD-10-CM

## 2022-02-13 ENCOUNTER — Other Ambulatory Visit: Payer: Self-pay | Admitting: Cardiovascular Disease

## 2022-02-28 ENCOUNTER — Other Ambulatory Visit: Payer: Self-pay | Admitting: Cardiovascular Disease

## 2022-03-05 NOTE — Therapy (Unsigned)
OUTPATIENT PHYSICAL THERAPY PROSTHETICS EVALUATION   Patient Name: Samuel Carney MRN: [id number] DOB:1945/04/16, 76 y.o., male Today's Date: 03/06/2022  PCP: Juluis Pitch, MD REFERRING PROVIDER: Jamse Arn, MD at [place]:  PT End of Session - 03/06/22 1623     Visit Number 1    Number of Visits 13    Date for PT Re-Evaluation 05/31/22    Authorization Type [employer] Medicare    Authorization Time Period 20% co-insurance    Progress Note Due on Visit 10    PT Start Time 1100    PT Stop Time 1144    PT Time Calculation (min) 44 min    Equipment Utilized During Treatment Gait belt    Activity Tolerance Patient tolerated treatment well;Patient limited by fatigue    Behavior During Therapy WFL for tasks assessed/performed             Past Medical History:  Diagnosis Date   Alcohol abuse    Aortic atherosclerosis ([place])    B12 deficiency    Cirrhosis (HCC)    COPD (chronic obstructive pulmonary disease) ([place])    Coronary artery disease    GERD (gastroesophageal reflux disease)    Glaucoma    Grade II diastolic dysfunction    History of kidney stones    HLD (hyperlipidemia)    Hx of radiation therapy    Hypertension    Lung mass    Moderate mitral regurgitation    Pneumonia    PVD (peripheral vascular disease) ([place])    Squamous cell carcinoma of lung, right ([place]) 2019   Past Surgical History:  Procedure Laterality Date   COLONOSCOPY WITH PROPOFOL     COLONOSCOPY WITH PROPOFOL N/A 11/25/2018   Procedure: COLONOSCOPY WITH PROPOFOL;  Surgeon: Lollie Sails, MD;  Location[facility] ENDOSCOPY;  Service: Endoscopy;  Laterality: N/A;   COLONOSCOPY WITH PROPOFOL N/A 11/29/2021   Procedure: COLONOSCOPY WITH PROPOFOL;  Surgeon: Toledo, Benay Pike, MD;  Location: [facility] ENDOSCOPY;  Service: Gastroenterology;  Laterality: N/A;   ENDARTERECTOMY FEMORAL Left 07/13/2020   Procedure: ENDARTERECTOMY FEMORAL ( SFA STENT);  Surgeon: Katha Cabal, MD;   Location: [facility] ORS;  Service: Vascular;  Laterality: Left;   ESOPHAGOGASTRODUODENOSCOPY (EGD) WITH PROPOFOL N/A 11/25/2018   Procedure: ESOPHAGOGASTRODUODENOSCOPY (EGD) WITH PROPOFOL;  Surgeon: Lollie Sails, MD;  Location: [facility] ENDOSCOPY;  Service: Endoscopy;  Laterality: N/A;   ESOPHAGOGASTRODUODENOSCOPY (EGD) WITH PROPOFOL N/A 11/29/2021   Procedure: ESOPHAGOGASTRODUODENOSCOPY (EGD) WITH PROPOFOL;  Surgeon: Toledo, Benay Pike, MD;  Location: [facility] ENDOSCOPY;  Service: Gastroenterology;  Laterality: N/A;   ESOPHAGOGASTRODUODENOSCOPY (EGD) WITH PROPOFOL N/A 12/08/2021   Procedure: ESOPHAGOGASTRODUODENOSCOPY (EGD) WITH PROPOFOL;  Surgeon: [name], MD;  Location: [facility] ENDOSCOPY;  Service: Gastroenterology;  Laterality: N/A;   LEG AMPUTATION ABOVE KNEE Left    LOWER EXTREMITY ANGIOGRAPHY Left 06/07/2020   Procedure: LOWER EXTREMITY ANGIOGRAPHY;  Surgeon: Katha Cabal, MD;  Location: [place] CV LAB;  Service: Cardiovascular;  Laterality: Left;   LOWER EXTREMITY ANGIOGRAPHY Left 01/10/2021   Procedure: LOWER EXTREMITY ANGIOGRAPHY;  Surgeon: Katha Cabal, MD;  Location: [place] CV LAB;  Service: Cardiovascular;  Laterality: Left;   Patient Active Problem List   Diagnosis Date Noted   UGIB (upper gastrointestinal bleed)    Malnutrition of moderate degree 12/06/2021   Melena 12/05/2021   COPD (chronic obstructive pulmonary disease) (HCC)    Grade II diastolic dysfunction    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), left ([place])  AKI (acute kidney injury) ([place])    Atherosclerosis of artery of extremity with ulceration ([place]) 07/13/2020   Atherosclerosis of native arteries of the extremities with ulceration ([place]) 96/29/5284   Alcoholic cirrhosis of liver without ascites ([place]) 04/09/2019   Hyperlipidemia, mixed 04/09/2019   Malignant 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 ([place]) 04/09/2019   Thrombocytopenia ([place]) 04/09/2019   CAD (coronary artery disease) 10/14/2017    Lung cancer ([place]) 10/14/2017   Tobacco abuse 08/14/2017   Benign essential hypertension 09/09/2013    ONSET DATE: 02/16/2022 new prosthesis delivery  REFERRING DIAG: X32.440 Acquired absence of left leg above knee  THERAPY DIAG:  Unsteadiness on feet  Other abnormalities of gait and mobility  Muscle weakness (generalized)  Abnormal posture  Stiffness of left hip, not elsewhere classified  Rationale for Evaluation and Treatment: Rehabilitation  SUBJECTIVE:   SUBJECTIVE STATEMENT: This 76yo male underwent a 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 He got his first prosthesis on 05/29/2021 and socket revision with new suspension type on prosthesis on 02/16/2022. He had OPPT for initial prosthetic training. He has worn new prosthesis 15 of 19 days since delivery 10am - 3pm.   Pt accompanied by: family member  PERTINENT HISTORY: PAD, CAD, angina, malignant neoplasm of right lung, HLD, alcoholic cirrhosis of liver, HTN, COPD  PAIN:  Are you having pain? Yes: NPRS scale: 7-8/10 Pain location: lumbar right & left sides Pain description: sore with muscles spasm Aggravating factors: standing & walking Relieving factors: sitting  PRECAUTIONS: Fall  WEIGHT BEARING RESTRICTIONS: No  FALLS: Has patient fallen in last 6 months? No  LIVING ENVIRONMENT: Lives with: lives with their spouse Lives in: House Home Access: Stairs to enter and Ramped entrance Home layout: One level and single step into washer [place] Stairs: Yes: External: 2 steps; none Has following equipment at home: Single point cane, Walker - 2 wheeled, Crutches, Wheelchair (manual), and shower chair  PLOF: Independent, Independent with household mobility with device, and Independent with community mobility with device  PATIENT GOALS: walk more including community.   OBJECTIVE:  COGNITION: Overall cognitive status: Within functional limits for tasks assessed  POSTURE: rounded  shoulders, forward head, flexed trunk , and weight shift right  LOWER EXTREMITY ROM:  ROM P:passive  A:active Right eval Left eval  Hip flexion    Hip extension  Standing A: -17*  Hip abduction    Hip adduction    Hip internal rotation    Hip external rotation    Knee flexion    Knee extension    Ankle dorsiflexion    Ankle plantarflexion    Ankle inversion    Ankle eversion     (Blank rows = not tested)  LOWER EXTREMITY MMT:  MMT Right eval Left eval  Hip flexion    Hip extension  4-/5  Hip abduction  4-/5  Hip adduction    Hip internal rotation    Hip external rotation  NA  Knee flexion 4/5 NA  Knee extension 4/5 NA  Ankle dorsiflexion  NA  Ankle plantarflexion  NA  Ankle inversion  NA  Ankle eversion  NA  (Blank rows = not tested)  TRANSFERS: Sit to stand: SBA requires use of UEs from chair without armrests to RW for stabilization.  Stand to sit: SBA requires use of UEs to chair without armrests from RW for stability.   GAIT: Gait pattern: step to pattern, decreased step length- Right, decreased stance time- Left, circumduction- Left, Left hip hike,  knee flexed in stance- Right, and trunk flexed Distance walked: 125' Assistive device utilized: Environmental consultant - 2 wheeled & TFA prosthesis Level of assistance: SBA Gait velocity: 1.29 ft/sec Comments: pt amb 20' with cane stand alone tip with modA.   RAMP pt neg 12* ramp with RW & TFA prosthesis with SBA  CURB: pt neg 6" curb with RW & TFA prosthesis with SBA  FUNCTIONAL TESTs:  Berg Balance Scale: 14/56  [facility] PT Assessment - 03/06/22 1100       Standardized Balance Assessment   Standardized Balance Assessment Berg Balance Test      Berg Balance Test   Sit to Stand Needs minimal aid to stand or to stabilize   needs minA to stabilize or RW support   Standing Unsupported Needs several tries to stand 30 seconds unsupported    Sitting with Back Unsupported but Feet Supported on Floor or Stool Able to sit safely  and securely 2 minutes    Stand to Sit Controls descent by using hands    Transfers Able to transfer safely, definite need of hands    Standing Unsupported with Eyes Closed Needs help to keep from falling    Standing Unsupported with Feet Together Needs help to attain position and unable to hold for 15 seconds    From Standing, Reach Forward with Outstretched Arm Reaches forward but needs supervision    From Standing Position, Pick up Object from Floor Unable to try/needs assist to keep balance    From Standing Position, Turn to Look Behind Over each Shoulder Needs supervision when turning    Turn 360 Degrees Needs assistance while turning    Standing Unsupported, Alternately Place Feet on Step/Stool Needs assistance to keep from falling or unable to try    Standing Unsupported, One Foot in [employer] balance while stepping or standing    Standing on One Leg Unable to try or needs assist to prevent fall    Total Score 14              CURRENT PROSTHETIC WEAR ASSESSMENT: Patient is independent with: skin check, residual limb care, and prosthetic cleaning Patient is dependent with: correct ply sock adjustment, proper wear schedule/adjustment, and proper weight-bearing schedule/adjustment Donning prosthesis: SBA with new suspension Doffing prosthesis: Modified independence Prosthetic wear tolerance: up to 5 hours, 1x/day, 14 of 19 days since delivery Prosthetic weight bearing tolerance: 5 minutes Edema: none Residual limb condition: pt & wife report no issues Prosthetic description: silicon liner with suction ring suspension, total contact socket with flexible inner liner, SAFETY knee, single axis foot K code/activity level with prosthetic use: Level 2    TODAY'S TREATMENT:                                                                                                                             DATE:  03/06/2022  PATIENT EDUCATION: PATIENT EDUCATED ON FOLLOWING PROSTHETIC  CARE: Education details:   Correct ply sock  adjustment, Propper donning, and Proper wear schedule/adjustment Prosthetic wear tolerance: 4 hours 2x/day, 7 days/week Person educated: Patient and Spouse Education method: Explanation, Tactile cues, and Verbal cues Education comprehension: verbalized understanding, verbal cues required, and needs further education  HOME EXERCISE PROGRAM:  ASSESSMENT:  CLINICAL IMPRESSION: Patient is a 76 y.o. male who was seen today for physical therapy evaluation and treatment for prosthetic training with left Transfemoral Amputation.  Patient received his socket revision for his transfemoral prosthesis 19 days prior to this physical therapy evaluation.  He has limited wear of prosthesis which increases risk of falls, overuse of right lower extremity and pain in low back.  Patient is dependent and proper donning and adjusting ply socks with new socket design.  Berg balance score of 14/56 indicates high risk of falls.  Patient would benefit from skilled physical therapy for instruction in balance and fitness activities to improve his activity tolerance and reduce his fall risk.  OBJECTIVE IMPAIRMENTS: Abnormal gait, cardiopulmonary status limiting activity, decreased activity tolerance, decreased balance, decreased endurance, decreased knowledge of use of DME, decreased mobility, decreased ROM, decreased strength, postural dysfunction, prosthetic dependency , and pain.   ACTIVITY LIMITATIONS: standing, stairs, transfers, locomotion level, and balance  PARTICIPATION LIMITATIONS: community activity and church  PERSONAL FACTORS: Fitness, Time since onset of injury/illness/exacerbation, and 3+ comorbidities: see PMH  are also affecting patient's functional outcome.   REHAB POTENTIAL: Good  CLINICAL DECISION MAKING: Evolving/moderate complexity  EVALUATION COMPLEXITY: Moderate   GOALS: Goals reviewed with patient? Yes  SHORT TERM GOALS: Target date:  04/06/2022  Patient donnes new socket / prosthesis modified independent. Baseline: SEE OBJECTIVE DATA Goal status: INITIAL 2.  Patient tolerates prosthesis >10 hrs total /day without skin issues or limb pain. Baseline: SEE OBJECTIVE DATA Goal status: INITIAL  3.  Patient demonstrates understanding of initial HEP.  Baseline: SEE OBJECTIVE DATA Goal status: INITIAL  LONG TERM GOALS: Target date: 05/31/2022  Patient demonstrates & verbalized understanding of prosthetic care to enable safe utilization of prosthesis. Baseline: SEE OBJECTIVE DATA Goal status: INITIAL  Patient tolerates daily prosthesis wear >50% of awake hours without skin or limb pain issues. Baseline: SEE OBJECTIVE DATA Goal status: INITIAL  Berg Balance >/= 18/56 indicate lower fall risk Baseline: SEE OBJECTIVE DATA Goal status: INITIAL  Patient ambulates >400' with prosthesis & RW modified independent Baseline: SEE OBJECTIVE DATA Goal status: INITIAL  Patient negotiates ramps, curbs with RW & stairs with single rail / cane with prosthesis modified independent. Baseline: SEE OBJECTIVE DATA Goal status: INITIAL  Patient verbalizes & demonstrates understanding of ongoing HEP. Baseline: SEE OBJECTIVE DATA Goal status: INITIAL  PLAN:  PT FREQUENCY: 1x/week  PT DURATION: 12 weeks  PLANNED INTERVENTIONS: Therapeutic exercises, Therapeutic activity, Neuromuscular re-education, Balance training, Gait training, Patient/Family education, Self Care, Stair training, Vestibular training, Prosthetic training, and DME instructions  PLAN FOR NEXT SESSION: HEP for balance & stretch hip flexors.  Jamey Reas, PT, DPT 03/06/2022, 4:52 PM

## 2022-03-06 ENCOUNTER — Ambulatory Visit (INDEPENDENT_AMBULATORY_CARE_PROVIDER_SITE_OTHER): Payer: Medicare Other | Admitting: Physical Therapy

## 2022-03-06 ENCOUNTER — Other Ambulatory Visit: Payer: Self-pay

## 2022-03-06 ENCOUNTER — Encounter: Payer: Self-pay | Admitting: Physical Therapy

## 2022-03-06 DIAGNOSIS — M6281 Muscle weakness (generalized): Secondary | ICD-10-CM | POA: Diagnosis not present

## 2022-03-06 DIAGNOSIS — R2689 Other abnormalities of gait and mobility: Secondary | ICD-10-CM | POA: Diagnosis not present

## 2022-03-06 DIAGNOSIS — M25652 Stiffness of left hip, not elsewhere classified: Secondary | ICD-10-CM

## 2022-03-06 DIAGNOSIS — R2681 Unsteadiness on feet: Secondary | ICD-10-CM | POA: Diagnosis not present

## 2022-03-06 DIAGNOSIS — R293 Abnormal posture: Secondary | ICD-10-CM

## 2022-03-21 ENCOUNTER — Ambulatory Visit (INDEPENDENT_AMBULATORY_CARE_PROVIDER_SITE_OTHER): Payer: Medicare Other | Admitting: Physical Therapy

## 2022-03-21 ENCOUNTER — Encounter: Payer: Self-pay | Admitting: Physical Therapy

## 2022-03-21 DIAGNOSIS — R2689 Other abnormalities of gait and mobility: Secondary | ICD-10-CM

## 2022-03-21 DIAGNOSIS — R293 Abnormal posture: Secondary | ICD-10-CM | POA: Diagnosis not present

## 2022-03-21 DIAGNOSIS — M6281 Muscle weakness (generalized): Secondary | ICD-10-CM | POA: Diagnosis not present

## 2022-03-21 DIAGNOSIS — M25652 Stiffness of left hip, not elsewhere classified: Secondary | ICD-10-CM

## 2022-03-21 DIAGNOSIS — R2681 Unsteadiness on feet: Secondary | ICD-10-CM | POA: Diagnosis not present

## 2022-03-21 NOTE — Therapy (Signed)
OUTPATIENT PHYSICAL THERAPY TREATMENT NOTE   Patient Name: Samuel Carney MRN: [id number] DOB:Jul 08, 1945, 76 y.o., male Today's Date: 03/21/2022  PCP: Juluis Pitch, MD  REFERRING PROVIDER: Jamse Arn, MD at [place]:   PT End of Session - 03/21/22 1150     Visit Number 2    Number of Visits 13    Date for PT Re-Evaluation 05/31/22    Authorization Type [employer] Medicare    Authorization Time Period 20% co-insurance    Progress Note Due on Visit 10    PT Start Time 1148    PT Stop Time 1230    PT Time Calculation (min) 42 min    Equipment Utilized During Treatment Gait belt    Activity Tolerance Patient tolerated treatment well;Patient limited by fatigue    Behavior During Therapy WFL for tasks assessed/performed             Past Medical History:  Diagnosis Date   Alcohol abuse    Aortic atherosclerosis ([place])    B12 deficiency    Cirrhosis ([place])    COPD (chronic obstructive pulmonary disease) ([place])    Coronary artery disease    GERD (gastroesophageal reflux disease)    Glaucoma    Grade II diastolic dysfunction    History of kidney stones    HLD (hyperlipidemia)    Hx of radiation therapy    Hypertension    Lung mass    Moderate mitral regurgitation    Pneumonia    PVD (peripheral vascular disease) ([place])    Squamous cell carcinoma of lung, right ([place]) 2019   Past Surgical History:  Procedure Laterality Date   COLONOSCOPY WITH PROPOFOL     COLONOSCOPY WITH PROPOFOL N/A 11/25/2018   Procedure: COLONOSCOPY WITH PROPOFOL;  Surgeon: Lollie Sails, MD;  Location: [facility] ENDOSCOPY;  Service: Endoscopy;  Laterality: N/A;   COLONOSCOPY WITH PROPOFOL N/A 11/29/2021   Procedure: COLONOSCOPY WITH PROPOFOL;  Surgeon: Toledo, Benay Pike, MD;  Location: [facility] ENDOSCOPY;  Service: Gastroenterology;  Laterality: N/A;   ENDARTERECTOMY FEMORAL Left 07/13/2020   Procedure: ENDARTERECTOMY FEMORAL ( SFA STENT);  Surgeon: Katha Cabal, MD;   Location: [facility] ORS;  Service: Vascular;  Laterality: Left;   ESOPHAGOGASTRODUODENOSCOPY (EGD) WITH PROPOFOL N/A 11/25/2018   Procedure: ESOPHAGOGASTRODUODENOSCOPY (EGD) WITH PROPOFOL;  Surgeon: Lollie Sails, MD;  Location: [facility] ENDOSCOPY;  Service: Endoscopy;  Laterality: N/A;   ESOPHAGOGASTRODUODENOSCOPY (EGD) WITH PROPOFOL N/A 11/29/2021   Procedure: ESOPHAGOGASTRODUODENOSCOPY (EGD) WITH PROPOFOL;  Surgeon: Toledo, Benay Pike, MD;  Location: [facility] ENDOSCOPY;  Service: Gastroenterology;  Laterality: N/A;   ESOPHAGOGASTRODUODENOSCOPY (EGD) WITH PROPOFOL N/A 12/08/2021   Procedure: ESOPHAGOGASTRODUODENOSCOPY (EGD) WITH PROPOFOL;  Surgeon: [name], MD;  Location[facility] END[facility]COPY;  Service: Gastroenterology;  Laterality: N/A;   LEG AMPUTATION ABOVE KNEE Left    LOWER EXTREMITY ANGIOGRAPHY Left 06/07/2020   Procedure: LOWER EXTREMITY ANGIOGRAPHY;  Surgeon: Katha Cabal, MD;  Location: [facility];  Service: Cardiovascular;  Laterality: Left;   LOWER EXTREMITY ANGIOGRAPHY Left 01/10/2021   Procedure: LOWER EXTREMITY ANGIOGRAPHY;  Surgeon: Katha Cabal, MD;  Location: [facility] [place] [facility];  Service: Cardiovascular;  Laterality: Left;   Patient Active Problem List   Diagnosis Date Noted   UGIB (upper gastrointestinal bleed)    Malnutrition of moderate degree 12/06/2021   Melena 12/05/2021   COPD (chronic obstructive pulmonary disease) ([facility])    Grade II diastolic dysfunction    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),  left [facility])    AKI (acute kidney injury) ([place])    Atherosclerosis of artery of extremity with ulceration ([place]) 07/13/2020   Atherosclerosis of native arteries of the extremities with ulceration ([place]) 37/85/8850   Alcoholic cirrhosis of liver without ascites ([place]) 04/09/2019   Hyperlipidemia, mixed 04/09/2019   Malignant 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 ([place]) 04/09/2019   Thrombocytopenia ([place]) 04/09/2019   CAD (coronary artery disease) 10/14/2017    Lung cancer ([place]) 10/14/2017   Tobacco abuse 08/14/2017   Benign essential hypertension 09/09/2013    REFERRING DIAG: Y77.412 Acquired absence of left leg above knee  ONSET DATE: 02/16/2022 new prosthesis delivery    THERAPY DIAG:  Unsteadiness on feet  Other abnormalities of gait and mobility  Muscle weakness (generalized)  Abnormal posture  Stiffness of left hip, not elsewhere classified  Rationale for Evaluation and Treatment Rehabilitation  PERTINENT HISTORY: PAD, CAD, angina, malignant neoplasm of right lung, HLD, alcoholic cirrhosis of liver, HTN, COPD   PRECAUTIONS: Fall  SUBJECTIVE:                                                                                                                                                                                      SUBJECTIVE STATEMENT:  He feels like he is going too deep into socket. He does not know how to use the socks with new suspension.   PAIN:  Are you having pain? Yes: NPRS scale: standing up to 7-8/10 & sitting up to 4-5/10 Pain location: lumbar right & left sides Pain description: sore with muscles spasm Aggravating factors: standing & walking Relieving factors: sitting  OBJECTIVE: (objective measures completed at initial evaluation unless otherwise dated)  COGNITION: Overall cognitive status: Within functional limits for tasks assessed   POSTURE: rounded shoulders, forward head, flexed trunk , and weight shift right   LOWER EXTREMITY ROM:   ROM P:passive  A:active Right eval Left eval  Hip flexion      Hip extension   Standing A: -17*  Hip abduction      Hip adduction      Hip internal rotation      Hip external rotation      Knee flexion      Knee extension      Ankle dorsiflexion      Ankle plantarflexion      Ankle inversion      Ankle eversion       (Blank rows = not tested)   LOWER EXTREMITY MMT:   MMT Right eval Left eval  Hip flexion      Hip extension   4-/5  Hip abduction    4-/5  Hip  adduction      Hip internal rotation      Hip external rotation   NA  Knee flexion 4/5 NA  Knee extension 4/5 NA  Ankle dorsiflexion   NA  Ankle plantarflexion   NA  Ankle inversion   NA  Ankle eversion   NA  (Blank rows = not tested)   TRANSFERS: Sit to stand: SBA requires use of UEs from chair without armrests to RW for stabilization.  Stand to sit: SBA requires use of UEs to chair without armrests from RW for stability.    GAIT: Gait pattern: step to pattern, decreased step length- Right, decreased stance time- Left, circumduction- Left, Left hip hike, knee flexed in stance- Right, and trunk flexed Distance walked: 125' Assistive device utilized: Environmental consultant - 2 wheeled & TFA prosthesis Level of assistance: SBA Gait velocity: 1.29 ft/sec Comments: pt amb 20' with cane stand alone tip with modA.    RAMP pt neg 12* ramp with RW & TFA prosthesis with SBA   CURB: pt neg 6" curb with RW & TFA prosthesis with SBA   FUNCTIONAL TESTs:  Berg Balance Scale: 14/56   [facility] PT Assessment - 03/06/22 1100                Standardized Balance Assessment    Standardized Balance Assessment Berg Balance Test          Berg Balance Test    Sit to Stand Needs minimal aid to stand or to stabilize   needs minA to stabilize or RW support    Standing Unsupported Needs several tries to stand 30 seconds unsupported     Sitting with Back Unsupported but Feet Supported on Floor or Stool Able to sit safely and securely 2 minutes     Stand to Sit Controls descent by using hands     Transfers Able to transfer safely, definite need of hands     Standing Unsupported with Eyes Closed Needs help to keep from falling     Standing Unsupported with Feet Together Needs help to attain position and unable to hold for 15 seconds     From Standing, Reach Forward with Outstretched Arm Reaches forward but needs supervision     From Standing Position, Pick up Object from Floor Unable to try/needs assist to keep  balance     From Standing Position, Turn to Look Behind Over each Shoulder Needs supervision when turning     Turn 360 Degrees Needs assistance while turning     Standing Unsupported, Alternately Place Feet on Step/Stool Needs assistance to keep from falling or unable to try     Standing Unsupported, One Foot in [employer] balance while stepping or standing     Standing on One Leg Unable to try or needs assist to prevent fall     Total Score 14                     CURRENT PROSTHETIC WEAR ASSESSMENT: Patient is independent with: skin check, residual limb care, and prosthetic cleaning Patient is dependent with: correct ply sock adjustment, proper wear schedule/adjustment, and proper weight-bearing schedule/adjustment Donning prosthesis: SBA with new suspension Doffing prosthesis: Modified independence Prosthetic wear tolerance: up to 5 hours, 1x/day, 14 of 19 days since delivery Prosthetic weight bearing tolerance: 5 minutes Edema: none Residual limb condition: pt & wife report no issues Prosthetic description: silicon liner with suction ring suspension, total contact socket with flexible inner liner, SAFETY knee, single axis  foot K code/activity level with prosthetic use: Level 2       TODAY'S TREATMENT:                                                                                                                             DATE:  03/21/2022: Prosthetic Training with TFA prosthesis: PT demo & verbal cues on donning socks with suction ring suspension.  Pt & wife verbalized understanding. Pt ambulated 2' & 79' with RW safely.  Neuromuscular Re-ed: HEP (verbal, demo & HO cues to pt & wife) noted below working on balance with head turns eyes open & eyes closed. Pt return demo understanding and pt/wife verbalized understanding.   Therapeutic Exercise: HEP (verbal, demo & HO cues to pt & wife) noted below working on hip flexor stretch. Pt return demo understanding and pt/wife  verbalized understanding.   03/06/2022   PATIENT EDUCATION: PATIENT EDUCATED ON FOLLOWING PROSTHETIC CARE: Education details:   Correct ply sock adjustment, Propper donning, and Proper wear schedule/adjustment Prosthetic wear tolerance: 4 hours 2x/day, 7 days/week Person educated: Patient and Spouse Education method: Explanation, Corporate treasurer cues, and Verbal cues Education comprehension: verbalized understanding, verbal cues required, and needs further education   HOME EXERCISE PROGRAM: Access Code: [email or web address] URL: [email or web address] Date: 03/21/2022 Prepared by: Jamey Reas  Exercises - Modified [place] (Mirrored)  - 1-2 x daily - 7 x weekly - 1 sets - 2-3 reps - 20-30 seconds hold - wide stance head motions eyes open  - 1-2 x daily - 7 x weekly - 1 sets - 10 reps - 2 seconds hold - Feet Apart with Eyes Closed with Head Motions  - 1-2 x daily - 7 x weekly - 1 sets - 10 reps - 2 seconds hold    ASSESSMENT:   CLINICAL IMPRESSION: Pt appears to understand initial HEP working on some balance & hip flexor stretch.  He also appears to understand how to donne socks with suction ring suspension.     OBJECTIVE IMPAIRMENTS: Abnormal gait, cardiopulmonary status limiting activity, decreased activity tolerance, decreased balance, decreased endurance, decreased knowledge of use of DME, decreased mobility, decreased ROM, decreased strength, postural dysfunction, prosthetic dependency , and pain.    ACTIVITY LIMITATIONS: standing, stairs, transfers, locomotion level, and balance   PARTICIPATION LIMITATIONS: community activity and church   PERSONAL FACTORS: Fitness, Time since onset of injury/illness/exacerbation, and 3+ comorbidities: see PMH  are also affecting patient's functional outcome.    REHAB POTENTIAL: Good   CLINICAL DECISION MAKING: Evolving/moderate complexity   EVALUATION COMPLEXITY: Moderate     GOALS: Goals reviewed with patient? Yes   SHORT TERM  GOALS: Target date: 04/06/2022   Patient donnes new socket / prosthesis modified independent. Baseline: SEE OBJECTIVE DATA Goal status: INITIAL 2.  Patient tolerates prosthesis >10 hrs total /day without skin issues or limb pain. Baseline: SEE OBJECTIVE DATA Goal status: INITIAL   3.  Patient demonstrates understanding of initial HEP.  Baseline: SEE OBJECTIVE  DATA Goal status: INITIAL   LONG TERM GOALS: Target date: 05/31/2022   Patient demonstrates & verbalized understanding of prosthetic care to enable safe utilization of prosthesis. Baseline: SEE OBJECTIVE DATA Goal status: INITIAL   Patient tolerates daily prosthesis wear >50% of awake hours without skin or limb pain issues. Baseline: SEE OBJECTIVE DATA Goal status: INITIAL   Berg Balance >/= 18/56 indicate lower fall risk Baseline: SEE OBJECTIVE DATA Goal status: INITIAL   Patient ambulates >400' with prosthesis & RW modified independent Baseline: SEE OBJECTIVE DATA Goal status: INITIAL   Patient negotiates ramps, curbs with RW & stairs with single rail / cane with prosthesis modified independent. Baseline: SEE OBJECTIVE DATA Goal status: INITIAL   Patient verbalizes & demonstrates understanding of ongoing HEP. Baseline: SEE OBJECTIVE DATA Goal status: INITIAL   PLAN:   PT FREQUENCY: 1x/week   PT DURATION: 12 weeks   PLANNED INTERVENTIONS: Therapeutic exercises, Therapeutic activity, Neuromuscular re-education, Balance training, Gait training, Patient/Family education, Self Care, Stair training, Vestibular training, Prosthetic training, and DME instructions   PLAN FOR NEXT SESSION: verbally check HEP, work on additional balance activities including using theraband.    Jamey Reas, PT, DPT 03/21/2022, 4:37 PM

## 2022-03-28 ENCOUNTER — Telehealth: Payer: Self-pay | Admitting: Physical Therapy

## 2022-03-28 NOTE — Telephone Encounter (Signed)
Patient states he missed a call from P/T.Marland Kitchen please advise..[phone].Marland Kitchen

## 2022-03-29 ENCOUNTER — Encounter: Payer: Medicare Other | Admitting: Physical Therapy

## 2022-03-29 NOTE — Therapy (Incomplete)
OUTPATIENT PHYSICAL THERAPY TREATMENT NOTE   Patient Name: Samuel Carney MRN: [id number] DOB:January 15, 1946, 76 y.o., male Today's Date: 03/21/2022  PCP: Juluis Pitch, MD  REFERRING PROVIDER: Jamse Arn, MD at [facility]:   PT End of Session - 03/21/22 1150     Visit Number 2    Number of Visits 13    Date for PT Re-Evaluation 05/31/22    Authorization Type [employer] Medicare    Authorization Time Period 20% co-insurance    Progress Note Due on Visit 10    PT Start Time 1148    PT Stop Time 1230    PT Time Calculation (min) 42 min    Equipment Utilized During Treatment Gait belt    Activity Tolerance Patient tolerated treatment well;Patient limited by fatigue    Behavior During Therapy WFL for tasks assessed/performed             Past Medical History:  Diagnosis Date   Alcohol abuse    Aortic atherosclerosis ([place])    B12 deficiency    Cirrhosis ([place])    COPD (chronic obstructive pulmonary disease) ([place])    Coronary artery disease    GERD (gastroesophageal reflux disease)    Glaucoma    Grade II diastolic dysfunction    History of kidney stones    HLD (hyperlipidemia)    Hx of radiation therapy    Hypertension    Lung mass    Moderate mitral regurgitation    Pneumonia    PVD (peripheral vascular disease) ([place])    Squamous cell carcinoma of lung, right ([place]) 2019   Past Surgical History:  Procedure Laterality Date   COLONOSCOPY WITH PROPOFOL     COLONOSCOPY WITH PROPOFOL N/A 11/25/2018   Procedure: COLONOSCOPY WITH PROPOFOL;  Surgeon: Lollie Sails, MD;  Location: [facility] ENDOSCOPY;  Service: Endoscopy;  Laterality: N/A;   COLONOSCOPY WITH PROPOFOL N/A 11/29/2021   Procedure: COLONOSCOPY WITH PROPOFOL;  Surgeon: Toledo, Benay Pike, MD;  Location: [facility] ENDOSCOPY;  Service: Gastroenterology;  Laterality: N/A;   ENDARTERECTOMY FEMORAL Left 07/13/2020   Procedure: ENDARTERECTOMY FEMORAL ( SFA STENT);  Surgeon: Katha Cabal, MD;   Location: [facility] ORS;  Service: Vascular;  Laterality: Left;   ESOPHAGOGASTRODUODENOSCOPY (EGD) WITH PROPOFOL N/A 11/25/2018   Procedure: ESOPHAGOGASTRODUODENOSCOPY (EGD) WITH PROPOFOL;  Surgeon: Lollie Sails, MD;  Location: [facility] ENDOSCOPY;  Service: Endoscopy;  Laterality: N/A;   ESOPHAGOGASTRODUODENOSCOPY (EGD) WITH PROPOFOL N/A 11/29/2021   Procedure: ESOPHAGOGASTRODUODENOSCOPY (EGD) WITH PROPOFOL;  Surgeon: Toledo, Benay Pike, MD;  Location: [facility] ENDOSCOPY;  Service: Gastroenterology;  Laterality: N/A;   ESOPHAGOGASTRODUODENOSCOPY (EGD) WITH PROPOFOL N/A 12/08/2021   Procedure: ESOPHAGOGASTRODUODENOSCOPY (EGD) WITH PROPOFOL;  Surgeon: [name], MD;  Location: [facility] ENDOSCOPY;  Service: Gastroenterology;  Laterality: N/A;   LEG AMPUTATION ABOVE KNEE Left    LOWER EXTREMITY ANGIOGRAPHY Left 06/07/2020   Procedure: LOWER EXTREMITY ANGIOGRAPHY;  Surgeon: Katha Cabal, MD;  Location: [place] CV LAB;  Service: Cardiovascular;  Laterality: Left;   LOWER EXTREMITY ANGIOGRAPHY Left 01/10/2021   Procedure: LOWER EXTREMITY ANGIOGRAPHY;  Surgeon: Katha Cabal, MD;  Location: [place] CV LAB;  Service: Cardiovascular;  Laterality: Left;   Patient Active Problem List   Diagnosis Date Noted   UGIB (upper gastrointestinal bleed)    Malnutrition of moderate degree 12/06/2021   Melena 12/05/2021   COPD (chronic obstructive pulmonary disease) (HCC)    Grade II diastolic dysfunction    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),  left [facility])    AKI (acute kidney injury) ([place])    Atherosclerosis of artery of extremity with ulceration ([place]) 07/13/2020   Atherosclerosis of native arteries of the extremities with ulceration ([place]) 50/93/2671   Alcoholic cirrhosis of liver without ascites ([place]) 04/09/2019   Hyperlipidemia, mixed 04/09/2019   Malignant 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 ([place]) 04/09/2019   Thrombocytopenia ([place]) 04/09/2019   CAD (coronary artery disease) 10/14/2017    Lung cancer ([place]) 10/14/2017   Tobacco abuse 08/14/2017   Benign essential hypertension 09/09/2013    REFERRING DIAG: 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.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  ONSET DATE: 02/16/2022 new prosthesis delivery    THERAPY DIAG:  Unsteadiness on feet  Other abnormalities of gait and mobility  Muscle weakness (generalized)  Abnormal posture  Stiffness of left hip, not elsewhere classified  Rationale for Evaluation and Treatment Rehabilitation  PERTINENT HISTORY: PAD, CAD, angina, malignant neoplasm of right lung, HLD, alcoholic cirrhosis of liver, HTN, COPD   PRECAUTIONS: Fall  SUBJECTIVE:                                                                                                                                                                                      SUBJECTIVE STATEMENT:  *** He feels like he is going too deep into socket. He does not know how to use the socks with new suspension.   PAIN:  Are you having pain? Yes: NPRS scale: standing up to *** 7-8/10 & sitting up to 4-5/10 Pain location: lumbar right & left sides Pain description: sore with muscles spasm Aggravating factors: standing & walking Relieving factors: sitting  OBJECTIVE: (objective measures completed at initial evaluation unless otherwise dated)  COGNITION: Overall cognitive status: Within functional limits for tasks assessed   POSTURE: rounded shoulders, forward head, flexed trunk , and weight shift right   LOWER EXTREMITY ROM:   ROM P:passive  A:active Right eval Left eval  Hip flexion      Hip extension   Standing A: -17*  Hip abduction      Hip adduction      Hip internal rotation      Hip external rotation      Knee flexion      Knee extension      Ankle dorsiflexion      Ankle plantarflexion      Ankle inversion      Ankle eversion       (Blank rows = not tested)   LOWER EXTREMITY MMT:   MMT Right eval Left eval  Hip flexion      Hip extension   4-/5  Hip  abduction   4-/5  Hip adduction      Hip internal rotation      Hip external rotation   NA  Knee flexion 4/5 NA  Knee extension 4/5 NA  Ankle dorsiflexion   NA  Ankle plantarflexion   NA  Ankle inversion   NA  Ankle eversion   NA  (Blank rows = not tested)   TRANSFERS: Sit to stand: SBA requires use of UEs from chair without armrests to RW for stabilization.  Stand to sit: SBA requires use of UEs to chair without armrests from RW for stability.    GAIT: Gait pattern: step to pattern, decreased step length- Right, decreased stance time- Left, circumduction- Left, Left hip hike, knee flexed in stance- Right, and trunk flexed Distance walked: 125' Assistive device utilized: Environmental consultant - 2 wheeled & TFA prosthesis Level of assistance: SBA Gait velocity: 1.29 ft/sec Comments: pt amb 20' with cane stand alone tip with modA.    RAMP pt neg 12* ramp with RW & TFA prosthesis with SBA   CURB: pt neg 6" curb with RW & TFA prosthesis with SBA   FUNCTIONAL TESTs:  Berg Balance Scale: 14/56   [facility] PT Assessment - 03/06/22 1100                Standardized Balance Assessment    Standardized Balance Assessment Berg Balance Test          Berg Balance Test    Sit to Stand Needs minimal aid to stand or to stabilize   needs minA to stabilize or RW support    Standing Unsupported Needs several tries to stand 30 seconds unsupported     Sitting with Back Unsupported but Feet Supported on Floor or Stool Able to sit safely and securely 2 minutes     Stand to Sit Controls descent by using hands     Transfers Able to transfer safely, definite need of hands     Standing Unsupported with Eyes Closed Needs help to keep from falling     Standing Unsupported with Feet Together Needs help to attain position and unable to hold for 15 seconds     From Standing, Reach Forward with Outstretched Arm Reaches forward but needs supervision     From Standing Position, Pick up Object from Floor Unable to try/needs  assist to keep balance     From Standing Position, Turn to Look Behind Over each Shoulder Needs supervision when turning     Turn 360 Degrees Needs assistance while turning     Standing Unsupported, Alternately Place Feet on Step/Stool Needs assistance to keep from falling or unable to try     Standing Unsupported, One Foot in [employer] balance while stepping or standing     Standing on One Leg Unable to try or needs assist to prevent fall     Total Score 14                     CURRENT PROSTHETIC WEAR ASSESSMENT: Patient is independent with: skin check, residual limb care, and prosthetic cleaning Patient is dependent with: correct ply sock adjustment, proper wear schedule/adjustment, and proper weight-bearing schedule/adjustment Donning prosthesis: SBA with new suspension Doffing prosthesis: Modified independence Prosthetic wear tolerance: up to 5 hours, 1x/day, 14 of 19 days since delivery Prosthetic weight bearing tolerance: 5 minutes Edema: none Residual limb condition: pt & wife report no issues Prosthetic description: silicon liner with suction ring suspension, total contact socket with flexible inner liner, SAFETY knee, single  axis foot K code/activity level with prosthetic use: Level 2       TODAY'S TREATMENT:                                                                                                                             DATE:  03/29/2022: ***  03/21/2022: Prosthetic Training with TFA prosthesis: PT demo & verbal cues on donning socks with suction ring suspension.  Pt & wife verbalized understanding. Pt ambulated 63' & 53' with RW safely.  Neuromuscular Re-ed: HEP (verbal, demo & HO cues to pt & wife) noted below working on balance with head turns eyes open & eyes closed. Pt return demo understanding and pt/wife verbalized understanding.   Therapeutic Exercise: HEP (verbal, demo & HO cues to pt & wife) noted below working on hip flexor stretch. Pt return  demo understanding and pt/wife verbalized understanding.   03/06/2022   PATIENT EDUCATION: PATIENT EDUCATED ON FOLLOWING PROSTHETIC CARE: Education details:   Correct ply sock adjustment, Propper donning, and Proper wear schedule/adjustment Prosthetic wear tolerance: 4 hours 2x/day, 7 days/week Person educated: Patient and Spouse Education method: Explanation, Corporate treasurer cues, and Verbal cues Education comprehension: verbalized understanding, verbal cues required, and needs further education   HOME EXERCISE PROGRAM: Access Code: [email or web address] URL: [email or web address] Date: 03/21/2022 Prepared by: Jamey Reas  Exercises - Modified [place] (Mirrored)  - 1-2 x daily - 7 x weekly - 1 sets - 2-3 reps - 20-30 seconds hold - wide stance head motions eyes open  - 1-2 x daily - 7 x weekly - 1 sets - 10 reps - 2 seconds hold - Feet Apart with Eyes Closed with Head Motions  - 1-2 x daily - 7 x weekly - 1 sets - 10 reps - 2 seconds hold    ASSESSMENT:   CLINICAL IMPRESSION: *** Pt appears to understand initial HEP working on some balance & hip flexor stretch.  He also appears to understand how to donne socks with suction ring suspension.     OBJECTIVE IMPAIRMENTS: Abnormal gait, cardiopulmonary status limiting activity, decreased activity tolerance, decreased balance, decreased endurance, decreased knowledge of use of DME, decreased mobility, decreased ROM, decreased strength, postural dysfunction, prosthetic dependency , and pain.    ACTIVITY LIMITATIONS: standing, stairs, transfers, locomotion level, and balance   PARTICIPATION LIMITATIONS: community activity and church   PERSONAL FACTORS: Fitness, Time since onset of injury/illness/exacerbation, and 3+ comorbidities: see PMH  are also affecting patient's functional outcome.    REHAB POTENTIAL: Good   CLINICAL DECISION MAKING: Evolving/moderate complexity   EVALUATION COMPLEXITY: Moderate     GOALS: Goals reviewed  with patient? Yes   SHORT TERM GOALS: Target date: 04/06/2022   Patient donnes new socket / prosthesis modified independent. Baseline: SEE OBJECTIVE DATA Goal status: INITIAL 2.  Patient tolerates prosthesis >10 hrs total /day without skin issues or limb pain. Baseline: SEE OBJECTIVE DATA Goal status: INITIAL   3.  Patient demonstrates understanding of initial  HEP.  Baseline: SEE OBJECTIVE DATA Goal status: INITIAL   LONG TERM GOALS: Target date: 05/31/2022   Patient demonstrates & verbalized understanding of prosthetic care to enable safe utilization of prosthesis. Baseline: SEE OBJECTIVE DATA Goal status: INITIAL   Patient tolerates daily prosthesis wear >50% of awake hours without skin or limb pain issues. Baseline: SEE OBJECTIVE DATA Goal status: INITIAL   Berg Balance >/= 18/56 indicate lower fall risk Baseline: SEE OBJECTIVE DATA Goal status: INITIAL   Patient ambulates >400' with prosthesis & RW modified independent Baseline: SEE OBJECTIVE DATA Goal status: INITIAL   Patient negotiates ramps, curbs with RW & stairs with single rail / cane with prosthesis modified independent. Baseline: SEE OBJECTIVE DATA Goal status: INITIAL   Patient verbalizes & demonstrates understanding of ongoing HEP. Baseline: SEE OBJECTIVE DATA Goal status: INITIAL   PLAN:   PT FREQUENCY: 1x/week   PT DURATION: 12 weeks   PLANNED INTERVENTIONS: Therapeutic exercises, Therapeutic activity, Neuromuscular re-education, Balance training, Gait training, Patient/Family education, Self Care, Stair training, Vestibular training, Prosthetic training, and DME instructions   PLAN FOR NEXT SESSION: *** verbally check HEP, work on additional balance activities including using theraband.    Jamey Reas, PT, DPT 03/21/2022, 4:37 PM

## 2022-04-04 ENCOUNTER — Ambulatory Visit (INDEPENDENT_AMBULATORY_CARE_PROVIDER_SITE_OTHER): Payer: Medicare Other | Admitting: Physical Therapy

## 2022-04-04 ENCOUNTER — Encounter: Payer: Self-pay | Admitting: Physical Therapy

## 2022-04-04 DIAGNOSIS — R2681 Unsteadiness on feet: Secondary | ICD-10-CM | POA: Diagnosis not present

## 2022-04-04 DIAGNOSIS — R2689 Other abnormalities of gait and mobility: Secondary | ICD-10-CM

## 2022-04-04 DIAGNOSIS — M6281 Muscle weakness (generalized): Secondary | ICD-10-CM

## 2022-04-04 DIAGNOSIS — R293 Abnormal posture: Secondary | ICD-10-CM

## 2022-04-04 DIAGNOSIS — M25652 Stiffness of left hip, not elsewhere classified: Secondary | ICD-10-CM

## 2022-04-04 NOTE — Therapy (Signed)
OUTPATIENT PHYSICAL THERAPY TREATMENT NOTE   Patient Name: Samuel Carney MRN: [id number] DOB:Jul 11, 1945, 77 y.o., male Today's Date: 04/04/2022  PCP: Juluis Pitch, MD  REFERRING PROVIDER: Jamse Arn, MD at [facility]:   PT End of Session - 04/04/22 0929     Visit Number 3    Number of Visits 13    Date for PT Re-Evaluation 05/31/22    Authorization Type [employer] Medicare    Authorization Time Period 20% co-insurance    Progress Note Due on Visit 10    PT Start Time 0930    PT Stop Time 1014    PT Time Calculation (min) 44 min    Equipment Utilized During Treatment Gait belt    Activity Tolerance Patient tolerated treatment well;Patient limited by fatigue    Behavior During Therapy WFL for tasks assessed/performed              Past Medical History:  Diagnosis Date   Alcohol abuse    Aortic atherosclerosis ([place])    B12 deficiency    Cirrhosis (HCC)    COPD (chronic obstructive pulmonary disease) ([place])    Coronary artery disease    GERD (gastroesophageal reflux disease)    Glaucoma    Grade II diastolic dysfunction    History of kidney stones    HLD (hyperlipidemia)    Hx of radiation therapy    Hypertension    Lung mass    Moderate mitral regurgitation    Pneumonia    PVD (peripheral vascular disease) ([place])    Squamous cell carcinoma of lung, right ([place]) 2019   Past Surgical History:  Procedure Laterality Date   COLONOSCOPY WITH PROPOFOL     COLONOSCOPY WITH PROPOFOL N/A 11/25/2018   Procedure: COLONOSCOPY WITH PROPOFOL;  Surgeon: Lollie Sails, MD;  Location: [facility] ENDOSCOPY;  Service: Endoscopy;  Laterality: N/A;   COLONOSCOPY WITH PROPOFOL N/A 11/29/2021   Procedure: COLONOSCOPY WITH PROPOFOL;  Surgeon: Toledo, Benay Pike, MD;  Location: [facility] ENDOSCOPY;  Service: Gastroenterology;  Laterality: N/A;   ENDARTERECTOMY FEMORAL Left 07/13/2020   Procedure: ENDARTERECTOMY FEMORAL ( SFA STENT);  Surgeon: Katha Cabal, MD;   Location: [facility] ORS;  Service: Vascular;  Laterality: Left;   ESOPHAGOGASTRODUODENOSCOPY (EGD) WITH PROPOFOL N/A 11/25/2018   Procedure: ESOPHAGOGASTRODUODENOSCOPY (EGD) WITH PROPOFOL;  Surgeon: Lollie Sails, MD;  Location: [facility] ENDOSCOPY;  Service: Endoscopy;  Laterality: N/A;   ESOPHAGOGASTRODUODENOSCOPY (EGD) WITH PROPOFOL N/A 11/29/2021   Procedure: ESOPHAGOGASTRODUODENOSCOPY (EGD) WITH PROPOFOL;  Surgeon: Toledo, Benay Pike, MD;  Location: [facility] ENDOSCOPY;  Service: Gastroenterology;  Laterality: N/A;   ESOPHAGOGASTRODUODENOSCOPY (EGD) WITH PROPOFOL N/A 12/08/2021   Procedure: ESOPHAGOGASTRODUODENOSCOPY (EGD) WITH PROPOFOL;  Surgeon: [name], MD;  Location[facility]COPY;  Service: Gastroenterology;  Laterality: N/A;   LEG AMPUTATION ABOVE KNEE Left    LOWER EXTREMITY ANGIOGRAPHY Left 06/07/2020   Procedure: LOWER EXTREMITY ANGIOGRAPHY;  Surgeon: Katha Cabal, MD;  Location: [place] CV LAB;  Service: Cardiovascular;  Laterality: Left;   LOWER EXTREMITY ANGIOGRAPHY Left 01/10/2021   Procedure: LOWER EXTREMITY ANGIOGRAPHY;  Surgeon: Katha Cabal, MD;  Location: [place] CV LAB;  Service: Cardiovascular;  Laterality: Left;   Patient Active Problem List   Diagnosis Date Noted   UGIB (upper gastrointestinal bleed)    Malnutrition of moderate degree 12/06/2021   Melena 12/05/2021   COPD (chronic obstructive pulmonary disease) (HCC)    Grade II diastolic dysfunction    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), left ([place])    AKI (acute kidney injury) ([place])    Atherosclerosis of artery of extremity with ulceration ([place]) 07/13/2020   Atherosclerosis of native arteries of the extremities with ulceration ([place]) 62/22/9798   Alcoholic cirrhosis of liver without ascites ([place]) 04/09/2019   Hyperlipidemia, mixed 04/09/2019   Malignant 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 ([place]) 04/09/2019   Thrombocytopenia ([place]) 04/09/2019   CAD (coronary artery disease) 10/14/2017    Lung cancer ([place]) 10/14/2017   Tobacco abuse 08/14/2017   Benign essential hypertension 09/09/2013    REFERRING DIAG: X21.194 Acquired absence of left leg above knee  ONSET DATE: 02/16/2022 new prosthesis delivery    THERAPY DIAG:  Unsteadiness on feet  Other abnormalities of gait and mobility  Muscle weakness (generalized)  Abnormal posture  Stiffness of left hip, not elsewhere classified  Rationale for Evaluation and Treatment Rehabilitation  PERTINENT HISTORY: PAD, CAD, angina, malignant neoplasm of right lung, HLD, alcoholic cirrhosis of liver, HTN, COPD   PRECAUTIONS: Fall  SUBJECTIVE:                                                                                                                                                                                      SUBJECTIVE STATEMENT:  He has been trying to do his exercises. The balance exercises are challenging.   PAIN:  Are you having pain? Yes: NPRS scale: standing 0/10 Pain location: lumbar right & left sides Pain description: sore with muscles spasm Aggravating factors: standing & walking Relieving factors: sitting  OBJECTIVE: (objective measures completed at initial evaluation unless otherwise dated)  COGNITION: Overall cognitive status: Within functional limits for tasks assessed   POSTURE: rounded shoulders, forward head, flexed trunk , and weight shift right   LOWER EXTREMITY ROM:   ROM P:passive  A:active Right eval Left eval  Hip flexion      Hip extension   Standing A: -17*  Hip abduction      Hip adduction      Hip internal rotation      Hip external rotation      Knee flexion      Knee extension      Ankle dorsiflexion      Ankle plantarflexion      Ankle inversion      Ankle eversion       (Blank rows = not tested)   LOWER EXTREMITY MMT:   MMT Right eval Left eval  Hip flexion      Hip extension   4-/5  Hip abduction   4-/5  Hip adduction      Hip internal rotation       Hip  external rotation   NA  Knee flexion 4/5 NA  Knee extension 4/5 NA  Ankle dorsiflexion   NA  Ankle plantarflexion   NA  Ankle inversion   NA  Ankle eversion   NA  (Blank rows = not tested)   TRANSFERS: Sit to stand: SBA requires use of UEs from chair without armrests to RW for stabilization.  Stand to sit: SBA requires use of UEs to chair without armrests from RW for stability.    GAIT: Gait pattern: step to pattern, decreased step length- Right, decreased stance time- Left, circumduction- Left, Left hip hike, knee flexed in stance- Right, and trunk flexed Distance walked: 125' Assistive device utilized: Environmental consultant - 2 wheeled & TFA prosthesis Level of assistance: SBA Gait velocity: 1.29 ft/sec Comments: pt amb 20' with cane stand alone tip with modA.    RAMP pt neg 12* ramp with RW & TFA prosthesis with SBA   CURB: pt neg 6" curb with RW & TFA prosthesis with SBA   FUNCTIONAL TESTs:  Berg Balance Scale: 14/56   [facility] PT Assessment - 03/06/22 1100                Standardized Balance Assessment    Standardized Balance Assessment Berg Balance Test          Berg Balance Test    Sit to Stand Needs minimal aid to stand or to stabilize   needs minA to stabilize or RW support    Standing Unsupported Needs several tries to stand 30 seconds unsupported     Sitting with Back Unsupported but Feet Supported on Floor or Stool Able to sit safely and securely 2 minutes     Stand to Sit Controls descent by using hands     Transfers Able to transfer safely, definite need of hands     Standing Unsupported with Eyes Closed Needs help to keep from falling     Standing Unsupported with Feet Together Needs help to attain position and unable to hold for 15 seconds     From Standing, Reach Forward with Outstretched Arm Reaches forward but needs supervision     From Standing Position, Pick up Object from Floor Unable to try/needs assist to keep balance     From Standing Position, Turn to Look  Behind Over each Shoulder Needs supervision when turning     Turn 360 Degrees Needs assistance while turning     Standing Unsupported, Alternately Place Feet on Step/Stool Needs assistance to keep from falling or unable to try     Standing Unsupported, One Foot in [employer] balance while stepping or standing     Standing on One Leg Unable to try or needs assist to prevent fall     Total Score 14                     CURRENT PROSTHETIC WEAR ASSESSMENT: Patient is independent with: skin check, residual limb care, and prosthetic cleaning Patient is dependent with: correct ply sock adjustment, proper wear schedule/adjustment, and proper weight-bearing schedule/adjustment Donning prosthesis: SBA with new suspension Doffing prosthesis: Modified independence Prosthetic wear tolerance: up to 5 hours, 1x/day, 14 of 19 days since delivery Prosthetic weight bearing tolerance: 5 minutes Edema: none Residual limb condition: pt & wife report no issues Prosthetic description: silicon liner with suction ring suspension, total contact socket with flexible inner liner, SAFETY knee, single axis foot K code/activity level with prosthetic use: Level 2  TODAY'S TREATMENT:                                                                                                                             DATE:  04/04/2022: Prosthetic Training with TFA prosthesis: He reports using socks correctly. Sit to/from stand working on not needing RW for support to stabilize.  From 22" w/c with cushion & armrests, from 20" w/c without cushion and 18" chair without armrests using UEs. Pt able to transfer sit <>stand with verbal cues. PT demo & verbal cues on rationale for rollator walker use and safe use including sit<>stand.  Pt ambulated 100' with rollator walker negotiating around obstacles with CGA.  Pt able to sit / stand on rollator seat 1st time walker stabilized against wall & 2nd time in open area with  supervision.   PT demo & verbal cues on neg ramp & curb with rollator walker.  Pt neg ramp & curb with CGA.    03/21/2022: Prosthetic Training with TFA prosthesis: PT demo & verbal cues on donning socks with suction ring suspension.  Pt & wife verbalized understanding. Pt ambulated 29' & 97' with RW safely.  Neuromuscular Re-ed: HEP (verbal, demo & HO cues to pt & wife) noted below working on balance with head turns eyes open & eyes closed. Pt return demo understanding and pt/wife verbalized understanding.   Therapeutic Exercise: HEP (verbal, demo & HO cues to pt & wife) noted below working on hip flexor stretch. Pt return demo understanding and pt/wife verbalized understanding.   03/06/2022   PATIENT EDUCATION: PATIENT EDUCATED ON FOLLOWING PROSTHETIC CARE: Education details:   Correct ply sock adjustment, Propper donning, and Proper wear schedule/adjustment Prosthetic wear tolerance: 4 hours 2x/day, 7 days/week Person educated: Patient and Spouse Education method: Explanation, Corporate treasurer cues, and Verbal cues Education comprehension: verbalized understanding, verbal cues required, and needs further education   HOME EXERCISE PROGRAM: Access Code: [email or web address] URL: [email or web address] Date: 03/21/2022 Prepared by: Jamey Reas  Exercises - Modified [place] [employer] (Mirrored)  - 1-2 x daily - 7 x weekly - 1 sets - 2-3 reps - 20-30 seconds hold - wide stance head motions eyes open  - 1-2 x daily - 7 x weekly - 1 sets - 10 reps - 2 seconds hold - Feet Apart with Eyes Closed with Head Motions  - 1-2 x daily - 7 x weekly - 1 sets - 10 reps - 2 seconds hold    ASSESSMENT:   CLINICAL IMPRESSION: Pt did well with rollator walker and appears could use with additional training with PT.  A rollator walker would enable ability to transport items like plate of food from one area to another and go into community ambulating more as he would have a place to rest as needed.  Pt & his  wife feel this walker would help him.      OBJECTIVE IMPAIRMENTS: Abnormal gait, cardiopulmonary status limiting activity, decreased activity tolerance, decreased balance, decreased endurance, decreased  knowledge of use of DME, decreased mobility, decreased ROM, decreased strength, postural dysfunction, prosthetic dependency , and pain.    ACTIVITY LIMITATIONS: standing, stairs, transfers, locomotion level, and balance   PARTICIPATION LIMITATIONS: community activity and church   PERSONAL FACTORS: Fitness, Time since onset of injury/illness/exacerbation, and 3+ comorbidities: see PMH  are also affecting patient's functional outcome.    REHAB POTENTIAL: Good   CLINICAL DECISION MAKING: Evolving/moderate complexity   EVALUATION COMPLEXITY: Moderate     GOALS: Goals reviewed with patient? Yes   SHORT TERM GOALS: Target date: 04/06/2022   Patient donnes new socket / prosthesis modified independent. Baseline: SEE OBJECTIVE DATA Goal status: INITIAL 2.  Patient tolerates prosthesis >10 hrs total /day without skin issues or limb pain. Baseline: SEE OBJECTIVE DATA Goal status: INITIAL   3.  Patient demonstrates understanding of initial HEP.  Baseline: SEE OBJECTIVE DATA Goal status: INITIAL   LONG TERM GOALS: Target date: 05/31/2022   Patient demonstrates & verbalized understanding of prosthetic care to enable safe utilization of prosthesis. Baseline: SEE OBJECTIVE DATA Goal status: INITIAL   Patient tolerates daily prosthesis wear >50% of awake hours without skin or limb pain issues. Baseline: SEE OBJECTIVE DATA Goal status: INITIAL   Berg Balance >/= 18/56 indicate lower fall risk Baseline: SEE OBJECTIVE DATA Goal status: INITIAL   Patient ambulates >400' with prosthesis & RW modified independent Baseline: SEE OBJECTIVE DATA Goal status: INITIAL   Patient negotiates ramps, curbs with RW & stairs with single rail / cane with prosthesis modified independent. Baseline: SEE  OBJECTIVE DATA Goal status: INITIAL   Patient verbalizes & demonstrates understanding of ongoing HEP. Baseline: SEE OBJECTIVE DATA Goal status: INITIAL   PLAN:   PT FREQUENCY: 1x/week   PT DURATION: 12 weeks   PLANNED INTERVENTIONS: Therapeutic exercises, Therapeutic activity, Neuromuscular re-education, Balance training, Gait training, Patient/Family education, Self Care, Stair training, Vestibular training, Prosthetic training, and DME instructions   PLAN FOR NEXT SESSION:  work on gait with rollator walker, balance activities    Jamey Reas, PT, DPT 04/04/2022, 3:45 PM

## 2022-04-11 ENCOUNTER — Encounter: Payer: Self-pay | Admitting: Physical Therapy

## 2022-04-11 ENCOUNTER — Ambulatory Visit (INDEPENDENT_AMBULATORY_CARE_PROVIDER_SITE_OTHER): Payer: Medicare Other | Admitting: Physical Therapy

## 2022-04-11 DIAGNOSIS — M6281 Muscle weakness (generalized): Secondary | ICD-10-CM | POA: Diagnosis not present

## 2022-04-11 DIAGNOSIS — R2681 Unsteadiness on feet: Secondary | ICD-10-CM

## 2022-04-11 DIAGNOSIS — R293 Abnormal posture: Secondary | ICD-10-CM

## 2022-04-11 DIAGNOSIS — R2689 Other abnormalities of gait and mobility: Secondary | ICD-10-CM

## 2022-04-11 DIAGNOSIS — M25652 Stiffness of left hip, not elsewhere classified: Secondary | ICD-10-CM

## 2022-04-11 NOTE — Therapy (Signed)
OUTPATIENT PHYSICAL THERAPY TREATMENT NOTE   Patient Name: Samuel Carney MRN: [id number] DOB:05-17-1945, 77 y.o., male Today's Date: 04/11/2022  PCP: Juluis Pitch, MD  REFERRING PROVIDER: Jamse Arn, MD at [place]:   PT End of Session - 04/11/22 0957     Visit Number 4    Number of Visits 13    Date for PT Re-Evaluation 05/31/22    Authorization Type [employer] Medicare    Authorization Time Period 20% co-insurance    Progress Note Due on Visit 10    PT Start Time 0930    PT Stop Time 1013    PT Time Calculation (min) 43 min    Equipment Utilized During Treatment Gait belt    Activity Tolerance Patient tolerated treatment well;Patient limited by fatigue    Behavior During Therapy WFL for tasks assessed/performed               Past Medical History:  Diagnosis Date   Alcohol abuse    Aortic atherosclerosis ([place])    B12 deficiency    Cirrhosis ([place])    COPD (chronic obstructive pulmonary disease) ([place])    Coronary artery disease    GERD (gastroesophageal reflux disease)    Glaucoma    Grade II diastolic dysfunction    History of kidney stones    HLD (hyperlipidemia)    Hx of radiation therapy    Hypertension    Lung mass    Moderate mitral regurgitation    Pneumonia    PVD (peripheral vascular disease) ([place])    Squamous cell carcinoma of lung, right ([place]) 2019   Past Surgical History:  Procedure Laterality Date   COLONOSCOPY WITH PROPOFOL     COLONOSCOPY WITH PROPOFOL N/A 11/25/2018   Procedure: COLONOSCOPY WITH PROPOFOL;  Surgeon: Lollie Sails, MD;  Location: [facility] ENDOSCOPY;  Service: Endoscopy;  Laterality: N/A;   COLONOSCOPY WITH PROPOFOL N/A 11/29/2021   Procedure: COLONOSCOPY WITH PROPOFOL;  Surgeon: Toledo, Benay Pike, MD;  Location: [facility] ENDOSCOPY;  Service: Gastroenterology;  Laterality: N/A;   ENDARTERECTOMY FEMORAL Left 07/13/2020   Procedure: ENDARTERECTOMY FEMORAL ( SFA STENT);  Surgeon: Katha Cabal, MD;   Location: [facility] ORS;  Service: Vascular;  Laterality: Left;   ESOPHAGOGASTRODUODENOSCOPY (EGD) WITH PROPOFOL N/A 11/25/2018   Procedure: ESOPHAGOGASTRODUODENOSCOPY (EGD) WITH PROPOFOL;  Surgeon: Lollie Sails, MD;  Location: [facility] ENDOSCOPY;  Service: Endoscopy;  Laterality: N/A;   ESOPHAGOGASTRODUODENOSCOPY (EGD) WITH PROPOFOL N/A 11/29/2021   Procedure: ESOPHAGOGASTRODUODENOSCOPY (EGD) WITH PROPOFOL;  Surgeon: Toledo, Benay Pike, MD;  Location: [facility] ENDOSCOPY;  Service: Gastroenterology;  Laterality: N/A;   ESOPHAGOGASTRODUODENOSCOPY (EGD) WITH PROPOFOL N/A 12/08/2021   Procedure: ESOPHAGOGASTRODUODENOSCOPY (EGD) WITH PROPOFOL;  Surgeon: [name], MD;  Location: [facility] ENDOSCOPY;  Service: Gastroenterology;  Laterality: N/A;   LEG AMPUTATION ABOVE KNEE Left    LOWER EXTREMITY ANGIOGRAPHY Left 06/07/2020   Procedure: LOWER EXTREMITY ANGIOGRAPHY;  Surgeon: Katha Cabal, MD;  Location: [place] CV LAB;  Service: Cardiovascular;  Laterality: Left;   LOWER EXTREMITY ANGIOGRAPHY Left 01/10/2021   Procedure: LOWER EXTREMITY ANGIOGRAPHY;  Surgeon: Katha Cabal, MD;  Location: [place] CV LAB;  Service: Cardiovascular;  Laterality: Left;   Patient Active Problem List   Diagnosis Date Noted   UGIB (upper gastrointestinal bleed)    Malnutrition of moderate degree 12/06/2021   Melena 12/05/2021   COPD (chronic obstructive pulmonary disease) ([facility])    Grade II diastolic dysfunction    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), left ([facility])    AKI (acute kidney injury) ([place])    Atherosclerosis of artery of extremity with ulceration ([place]) 07/13/2020   Atherosclerosis of native arteries of the extremities with ulceration ([place]) 94/17/4081   Alcoholic cirrhosis of liver without ascites ([place]) 04/09/2019   Hyperlipidemia, mixed 04/09/2019   Malignant 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) 04/09/2019   Thrombocytopenia ([place]) 04/09/2019   CAD (coronary artery disease) 10/14/2017    Lung cancer ([place]) 10/14/2017   Tobacco abuse 08/14/2017   Benign essential hypertension 09/09/2013    REFERRING DIAG: 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  ONSET DATE: 02/16/2022 new prosthesis delivery    THERAPY DIAG:  Unsteadiness on feet  Muscle weakness (generalized)  Abnormal posture  Other abnormalities of gait and mobility  Stiffness of left hip, not elsewhere classified  Rationale for Evaluation and Treatment Rehabilitation  PERTINENT HISTORY: PAD, CAD, angina, malignant neoplasm of right lung, HLD, alcoholic cirrhosis of liver, HTN, COPD   PRECAUTIONS: Fall  SUBJECTIVE:                                                                                                                                                                                      SUBJECTIVE STATEMENT:  He saw Dr. Posey Pronto who ordered rollator walker. They plan to get one with 10" wheels as his driveway is gravel. He said RLE looks good.   PAIN:  Are you having pain? Yes: NPRS scale: standing 0/10 Pain location: lumbar right & left sides Pain description: sore with muscles spasm Aggravating factors: standing & walking Relieving factors: sitting  OBJECTIVE: (objective measures completed at initial evaluation unless otherwise dated)  COGNITION: Overall cognitive status: Within functional limits for tasks assessed   POSTURE: rounded shoulders, forward head, flexed trunk , and weight shift right   LOWER EXTREMITY ROM:   ROM P:passive  A:active Right eval Left eval Left 04/11/22  Hip flexion       Hip extension   Standing A: -17* Standing A: -13*  Hip abduction       Hip adduction       Hip internal rotation       Hip external rotation       Knee flexion       Knee extension       Ankle dorsiflexion       Ankle plantarflexion       Ankle inversion       Ankle eversion        (Blank rows = not tested)   LOWER EXTREMITY MMT:   MMT Right eval Left eval  Hip flexion  Hip extension   4-/5  Hip abduction   4-/5  Hip adduction      Hip internal rotation      Hip external rotation   NA  Knee flexion 4/5 NA  Knee extension 4/5 NA  Ankle dorsiflexion   NA  Ankle plantarflexion   NA  Ankle inversion   NA  Ankle eversion   NA  (Blank rows = not tested)   TRANSFERS: Sit to stand: SBA requires use of UEs from chair without armrests to RW for stabilization.  Stand to sit: SBA requires use of UEs to chair without armrests from RW for stability.    GAIT: Gait pattern: step to pattern, decreased step length- Right, decreased stance time- Left, circumduction- Left, Left hip hike, knee flexed in stance- Right, and trunk flexed Distance walked: 125' Assistive device utilized: Environmental consultant - 2 wheeled & TFA prosthesis Level of assistance: SBA Gait velocity: 1.29 ft/sec Comments: pt amb 20' with cane stand alone tip with modA.    RAMP pt neg 12* ramp with RW & TFA prosthesis with SBA   CURB: pt neg 6" curb with RW & TFA prosthesis with SBA   FUNCTIONAL TESTs:  Berg Balance Scale: 14/56   [facility] PT Assessment - 03/06/22 1100                Standardized Balance Assessment    Standardized Balance Assessment Berg Balance Test          Berg Balance Test    Sit to Stand Needs minimal aid to stand or to stabilize   needs minA to stabilize or RW support    Standing Unsupported Needs several tries to stand 30 seconds unsupported     Sitting with Back Unsupported but Feet Supported on Floor or Stool Able to sit safely and securely 2 minutes     Stand to Sit Controls descent by using hands     Transfers Able to transfer safely, definite need of hands     Standing Unsupported with Eyes Closed Needs help to keep from falling     Standing Unsupported with Feet Together Needs help to attain position and unable to hold for 15 seconds     From Standing, Reach Forward with Outstretched Arm Reaches forward but needs supervision     From Standing Position, Pick up Object from  Floor Unable to try/needs assist to keep balance     From Standing Position, Turn to Look Behind Over each Shoulder Needs supervision when turning     Turn 360 Degrees Needs assistance while turning     Standing Unsupported, Alternately Place Feet on Step/Stool Needs assistance to keep from falling or unable to try     Standing Unsupported, One Foot in [employer] balance while stepping or standing     Standing on One Leg Unable to try or needs assist to prevent fall     Total Score 14                     CURRENT PROSTHETIC WEAR ASSESSMENT: Patient is independent with: skin check, residual limb care, and prosthetic cleaning Patient is dependent with: correct ply sock adjustment, proper wear schedule/adjustment, and proper weight-bearing schedule/adjustment Donning prosthesis: SBA with new suspension Doffing prosthesis: Modified independence Prosthetic wear tolerance: up to 5 hours, 1x/day, 14 of 19 days since delivery Prosthetic weight bearing tolerance: 5 minutes Edema: none Residual limb condition: pt & wife report no issues Prosthetic description: silicon liner with suction  ring suspension, total contact socket with flexible inner liner, SAFETY knee, single axis foot K code/activity level with prosthetic use: Level 2       TODAY'S TREATMENT:                                                                                                                             DATE:  04/11/2022: Prosthetic Training with TFA prosthesis: PT instructed wife & pt in differences in rollator walker designs to consider when purchasing one. They both verbalized understanding.  Pt ambulated 220' with rollator walker negotiating around obstacles with SBA. One balance loss with knee instability that he was able to correct himself.  Pt able to sit / stand on rollator seat in open area with supervision.   PT demo & verbal cues on neg ramp & curb with rollator walker.  Pt neg ramp & curb with  SBA.  Neuromuscular Re-ed: Working on upright posture.   Supine hip flexor stretch hooklying with prosthesis over edge of bed 30 sec hold.   PT progressed to glut set / bridge with LLE/prosthetic foot on floor & RLE low mat table hooklying. 10 reps 2 sets.  Standing upright with posterior pelvis to counter with UEs on counter so ears over shoulders over hips over feet.  PT recommended 1-2X/day building up how long he can stand upright. Standing with back to door frame reaching overhead for stretch single UE & BUEs 2 reps ea.  Pt verbalized understanding of above as HEP to work on his posture.    04/04/2022: Prosthetic Training with TFA prosthesis: He reports using socks correctly. Sit to/from stand working on not needing RW for support to stabilize.  From 22" w/c with cushion & armrests, from 20" w/c without cushion and 18" chair without armrests using UEs. Pt able to transfer sit <>stand with verbal cues. PT demo & verbal cues on rationale for rollator walker use and safe use including sit<>stand.  Pt ambulated 100' with rollator walker negotiating around obstacles with CGA.  Pt able to sit / stand on rollator seat 1st time walker stabilized against wall & 2nd time in open area with supervision.   PT demo & verbal cues on neg ramp & curb with rollator walker.  Pt neg ramp & curb with CGA.    03/21/2022: Prosthetic Training with TFA prosthesis: PT demo & verbal cues on donning socks with suction ring suspension.  Pt & wife verbalized understanding. Pt ambulated 48' & 27' with RW safely.  Neuromuscular Re-ed: HEP (verbal, demo & HO cues to pt & wife) noted below working on balance with head turns eyes open & eyes closed. Pt return demo understanding and pt/wife verbalized understanding.   Therapeutic Exercise: HEP (verbal, demo & HO cues to pt & wife) noted below working on hip flexor stretch. Pt return demo understanding and pt/wife verbalized understanding.     PATIENT  EDUCATION: PATIENT EDUCATED ON FOLLOWING PROSTHETIC CARE: Education details:  Correct ply sock adjustment, Propper donning, and Proper wear schedule/adjustment Prosthetic wear tolerance: 4 hours 2x/day, 7 days/week Person educated: Patient and Spouse Education method: Explanation, Tactile cues, and Verbal cues Education comprehension: verbalized understanding, verbal cues required, and needs further education   HOME EXERCISE PROGRAM: Access Code: [email or web address] URL: [email or web address] Date: 03/21/2022 Prepared by: Jamey Reas  Exercises - Modified [place] [employer] (Mirrored)  - 1-2 x daily - 7 x weekly - 1 sets - 2-3 reps - 20-30 seconds hold - wide stance head motions eyes open  - 1-2 x daily - 7 x weekly - 1 sets - 10 reps - 2 seconds hold - Feet Apart with Eyes Closed with Head Motions  - 1-2 x daily - 7 x weekly - 1 sets - 10 reps - 2 seconds hold    ASSESSMENT:   CLINICAL IMPRESSION: Pt appears safe with rollator walker for level surfaces.  He would benefit from another review for ramps & curbs.  He seems to understand HEP designed to improve upright posture.     OBJECTIVE IMPAIRMENTS: Abnormal gait, cardiopulmonary status limiting activity, decreased activity tolerance, decreased balance, decreased endurance, decreased knowledge of use of DME, decreased mobility, decreased ROM, decreased strength, postural dysfunction, prosthetic dependency , and pain.    ACTIVITY LIMITATIONS: standing, stairs, transfers, locomotion level, and balance   PARTICIPATION LIMITATIONS: community activity and church   PERSONAL FACTORS: Fitness, Time since onset of injury/illness/exacerbation, and 3+ comorbidities: see PMH  are also affecting patient's functional outcome.    REHAB POTENTIAL: Good   CLINICAL DECISION MAKING: Evolving/moderate complexity   EVALUATION COMPLEXITY: Moderate     GOALS: Goals reviewed with patient? Yes   SHORT TERM GOALS: Target date: 04/06/2022   Patient  donnes new socket / prosthesis modified independent. Baseline: SEE OBJECTIVE DATA Goal status: MET 04/11/22 2.  Patient tolerates prosthesis >10 hrs total /day without skin issues or limb pain. Baseline: SEE OBJECTIVE DATA Goal status: MET 04/11/22   3.  Patient demonstrates understanding of initial HEP.  Baseline: SEE OBJECTIVE DATA Goal status: MET 04/11/22   LONG TERM GOALS: Target date: 05/31/2022   Patient demonstrates & verbalized understanding of prosthetic care to enable safe utilization of prosthesis. Baseline: SEE OBJECTIVE DATA Goal status: INITIAL   Patient tolerates daily prosthesis wear >50% of awake hours without skin or limb pain issues. Baseline: SEE OBJECTIVE DATA Goal status: INITIAL   Berg Balance >/= 18/56 indicate lower fall risk Baseline: SEE OBJECTIVE DATA Goal status: INITIAL   Patient ambulates >400' with prosthesis & RW modified independent Baseline: SEE OBJECTIVE DATA Goal status: INITIAL   Patient negotiates ramps, curbs with RW & stairs with single rail / cane with prosthesis modified independent. Baseline: SEE OBJECTIVE DATA Goal status: INITIAL   Patient verbalizes & demonstrates understanding of ongoing HEP. Baseline: SEE OBJECTIVE DATA Goal status: INITIAL   PLAN:   PT FREQUENCY: 1x/week   PT DURATION: 12 weeks   PLANNED INTERVENTIONS: Therapeutic exercises, Therapeutic activity, Neuromuscular re-education, Balance training, Gait training, Patient/Family education, Self Care, Stair training, Vestibular training, Prosthetic training, and DME instructions   PLAN FOR NEXT SESSION:  work towards LTGs, balance activities    Jamey Reas, PT, DPT 04/11/2022, 12:08 PM

## 2022-04-18 ENCOUNTER — Ambulatory Visit (INDEPENDENT_AMBULATORY_CARE_PROVIDER_SITE_OTHER): Payer: 59 | Admitting: Physical Therapy

## 2022-04-18 ENCOUNTER — Encounter: Payer: Self-pay | Admitting: Physical Therapy

## 2022-04-18 DIAGNOSIS — R2689 Other abnormalities of gait and mobility: Secondary | ICD-10-CM | POA: Diagnosis not present

## 2022-04-18 DIAGNOSIS — R293 Abnormal posture: Secondary | ICD-10-CM

## 2022-04-18 DIAGNOSIS — M25652 Stiffness of left hip, not elsewhere classified: Secondary | ICD-10-CM

## 2022-04-18 DIAGNOSIS — M6281 Muscle weakness (generalized): Secondary | ICD-10-CM | POA: Diagnosis not present

## 2022-04-18 DIAGNOSIS — R2681 Unsteadiness on feet: Secondary | ICD-10-CM

## 2022-04-18 NOTE — Therapy (Signed)
OUTPATIENT PHYSICAL THERAPY TREATMENT NOTE   Patient Name: Samuel Carney MRN: [id number] DOB:Aug 19, 1945, 77 y.o., male Today's Date: 04/18/2022  PCP: Juluis Pitch, MD  REFERRING PROVIDER: Jamse Arn, MD at [place]:   PT End of Session - 04/18/22 0932     Visit Number 5    Number of Visits 13    Date for PT Re-Evaluation 05/31/22    Authorization Type [employer] Medicare    Authorization Time Period 20% co-insurance    Progress Note Due on Visit 10    PT Start Time 0930    PT Stop Time 1016    PT Time Calculation (min) 46 min    Equipment Utilized During Treatment Gait belt    Activity Tolerance Patient tolerated treatment well;Patient limited by fatigue    Behavior During Therapy WFL for tasks assessed/performed                Past Medical History:  Diagnosis Date   Alcohol abuse    Aortic atherosclerosis ([place])    B12 deficiency    Cirrhosis (HCC)    COPD (chronic obstructive pulmonary disease) ([place])    Coronary artery disease    GERD (gastroesophageal reflux disease)    Glaucoma    Grade II diastolic dysfunction    History of kidney stones    HLD (hyperlipidemia)    Hx of radiation therapy    Hypertension    Lung mass    Moderate mitral regurgitation    Pneumonia    PVD (peripheral vascular disease) ([place])    Squamous cell carcinoma of lung, right ([place]) 2019   Past Surgical History:  Procedure Laterality Date   COLONOSCOPY WITH PROPOFOL     COLONOSCOPY WITH PROPOFOL N/A 11/25/2018   Procedure: COLONOSCOPY WITH PROPOFOL;  Surgeon: Lollie Sails, MD;  Location: [facility] ENDOSCOPY;  Service: Endoscopy;  Laterality: N/A;   COLONOSCOPY WITH PROPOFOL N/A 11/29/2021   Procedure: COLONOSCOPY WITH PROPOFOL;  Surgeon: Toledo, Benay Pike, MD;  Location: [facility] ENDOSCOPY;  Service: Gastroenterology;  Laterality: N/A;   ENDARTERECTOMY FEMORAL Left 07/13/2020   Procedure: ENDARTERECTOMY FEMORAL ( SFA STENT);  Surgeon: Katha Cabal, MD;   Location: [facility] ORS;  Service: Vascular;  Laterality: Left;   ESOPHAGOGASTRODUODENOSCOPY (EGD) WITH PROPOFOL N/A 11/25/2018   Procedure: ESOPHAGOGASTRODUODENOSCOPY (EGD) WITH PROPOFOL;  Surgeon: Lollie Sails, MD;  Location[facility] ENDOSCOPY;  Service: Endoscopy;  Laterality: N/A;   ESOPHAGOGASTRODUODENOSCOPY (EGD) WITH PROPOFOL N/A 11/29/2021   Procedure: ESOPHAGOGASTRODUODENOSCOPY (EGD) WITH PROPOFOL;  Surgeon: Toledo, Benay Pike, MD;  Location: [facility] ENDOSCOPY;  Service: Gastroenterology;  Laterality: N/A;   ESOPHAGOGASTRODUODENOSCOPY (EGD) WITH PROPOFOL N/A 12/08/2021   Procedure: ESOPHAGOGASTRODUODENOSCOPY (EGD) WITH PROPOFOL;  Surgeon: [name], MD;  Location: [facility] ENDOSCOPY;  Service: Gastroenterology;  Laterality: N/A;   LEG AMPUTATION ABOVE KNEE Left    LOWER EXTREMITY ANGIOGRAPHY Left 06/07/2020   Procedure: LOWER EXTREMITY ANGIOGRAPHY;  Surgeon: Katha Cabal, MD;  Location: [place] CV LAB;  Service: Cardiovascular;  Laterality: Left;   LOWER EXTREMITY ANGIOGRAPHY Left 01/10/2021   Procedure: LOWER EXTREMITY ANGIOGRAPHY;  Surgeon: Katha Cabal, MD;  Location: [place] [facility] LAB;  Service: Cardiovascular;  Laterality: Left;   Patient Active Problem List   Diagnosis Date Noted   UGIB (upper gastrointestinal bleed)    Malnutrition of moderate degree 12/06/2021   Melena 12/05/2021   COPD (chronic obstructive pulmonary disease) (HCC)    Grade II diastolic dysfunction    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), left (HCC)    AKI (acute kidney injury) (HCC)    Atherosclerosis of artery of extremity with ulceration (HCC) 07/13/2020   Atherosclerosis of native arteries of the extremities with ulceration (HCC) 05/19/2020   Alcoholic cirrhosis of 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) 04/09/2019   Hyperlipidemia, mixed 04/09/2019   Malignant 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) 04/09/2019   Thrombocytopenia (HCC) 04/09/2019   CAD (coronary artery disease) 10/14/2017    Lung cancer (HCC) 10/14/2017   Tobacco abuse 08/14/2017   Benign essential hypertension 09/09/2013    REFERRING DIAG: K80.034 Acquired absence of left leg above knee  ONSET DATE: 02/16/2022 new prosthesis delivery    THERAPY DIAG:  Unsteadiness on feet  Muscle weakness (generalized)  Abnormal posture  Other abnormalities of gait and mobility  Stiffness of left hip, not elsewhere classified  Rationale for Evaluation and Treatment Rehabilitation  PERTINENT HISTORY: PAD, CAD, angina, malignant neoplasm of right lung, HLD, alcoholic cirrhosis of liver, HTN, COPD   PRECAUTIONS: Fall  SUBJECTIVE:                                                                                                                                                                                      SUBJECTIVE STATEMENT:   He has not ordered a rollator yet.     PAIN:  Are you having pain? Yes: NPRS scale: standing 0/10 Pain location: lumbar right & left sides Pain description: sore with muscles spasm Aggravating factors: standing & walking Relieving factors: sitting  OBJECTIVE: (objective measures completed at initial evaluation unless otherwise dated)  COGNITION: Overall cognitive status: Within functional limits for tasks assessed   POSTURE: rounded shoulders, forward head, flexed trunk , and weight shift right   LOWER EXTREMITY ROM:   ROM P:passive  A:active Right eval Left eval Left 04/11/22  Hip flexion       Hip extension   Standing A: -17* Standing A: -13*  Hip abduction       Hip adduction       Hip internal rotation       Hip external rotation       Knee flexion       Knee extension       Ankle dorsiflexion       Ankle plantarflexion       Ankle inversion       Ankle eversion        (Blank rows = not tested)   LOWER EXTREMITY MMT:   MMT Right eval Left eval  Hip flexion      Hip extension   4-/5  Hip abduction   4-/5  Hip adduction  Hip internal rotation       Hip external rotation   NA  Knee flexion 4/5 NA  Knee extension 4/5 NA  Ankle dorsiflexion   NA  Ankle plantarflexion   NA  Ankle inversion   NA  Ankle eversion   NA  (Blank rows = not tested)   TRANSFERS: Sit to stand: SBA requires use of UEs from chair without armrests to RW for stabilization.  Stand to sit: SBA requires use of UEs to chair without armrests from RW for stability.    GAIT: Gait pattern: step to pattern, decreased step length- Right, decreased stance time- Left, circumduction- Left, Left hip hike, knee flexed in stance- Right, and trunk flexed Distance walked: 125' Assistive device utilized: Environmental consultant - 2 wheeled & TFA prosthesis Level of assistance: SBA Gait velocity: 1.29 ft/sec Comments: pt amb 20' with cane stand alone tip with modA.    RAMP pt neg 12* ramp with RW & TFA prosthesis with SBA   CURB: pt neg 6" curb with RW & TFA prosthesis with SBA   FUNCTIONAL TESTs:  Berg Balance Scale: 14/56   [facility] PT Assessment - 03/06/22 1100                Standardized Balance Assessment    Standardized Balance Assessment Berg Balance Test          Berg Balance Test    Sit to Stand Needs minimal aid to stand or to stabilize   needs minA to stabilize or RW support    Standing Unsupported Needs several tries to stand 30 seconds unsupported     Sitting with Back Unsupported but Feet Supported on Floor or Stool Able to sit safely and securely 2 minutes     Stand to Sit Controls descent by using hands     Transfers Able to transfer safely, definite need of hands     Standing Unsupported with Eyes Closed Needs help to keep from falling     Standing Unsupported with Feet Together Needs help to attain position and unable to hold for 15 seconds     From Standing, Reach Forward with Outstretched Arm Reaches forward but needs supervision     From Standing Position, Pick up Object from Floor Unable to try/needs assist to keep balance     From Standing Position, Turn to Look  Behind Over each Shoulder Needs supervision when turning     Turn 360 Degrees Needs assistance while turning     Standing Unsupported, Alternately Place Feet on Step/Stool Needs assistance to keep from falling or unable to try     Standing Unsupported, One Foot in [employer] balance while stepping or standing     Standing on One Leg Unable to try or needs assist to prevent fall     Total Score 14                     CURRENT PROSTHETIC WEAR ASSESSMENT: Patient is independent with: skin check, residual limb care, and prosthetic cleaning Patient is dependent with: correct ply sock adjustment, proper wear schedule/adjustment, and proper weight-bearing schedule/adjustment Donning prosthesis: SBA with new suspension Doffing prosthesis: Modified independence Prosthetic wear tolerance: up to 5 hours, 1x/day, 14 of 19 days since delivery Prosthetic weight bearing tolerance: 5 minutes Edema: none Residual limb condition: pt & wife report no issues Prosthetic description: silicon liner with suction ring suspension, total contact socket with flexible inner liner, SAFETY knee, single axis foot K code/activity level with  prosthetic use: Level 2       TODAY'S TREATMENT:                                                                                                                             DATE:  04/18/2022: Prosthetic Training with TFA prosthesis: Pt amb 100' including sit / stand with rollator walker and neg ramp/curb safely without PT cueing needed. PT demo & verbal cues on moving chair forward, backward, turning right & left like moving to/from dining room table.  Pt verbalized & return demo understanding.   PT recommending not sitting in w/c inside home so he walks to change locations.  Pt & wife verbalized understanding.    Neuromuscular Re-ed: Standing activities noted below. Pt performed under PT supervision & instruction. PT issued HO for HEP. Pt & wife verbalized understanding.    Stand with chair behind you and counter / walker in front of you Use both hands to move a light non-breakable item over head. Watch with eyes and back leans backward slight amount. 10 reps Use both hands to move a light non-breakable item to your right, back to front (stand up straight), to your left and back to front.  10 reps Use both hands to move a light non-breakable item to chair bottom in front of you then pick it up standing up straight. Use right hand to move a light non-breakable item to chair bottom on your right side then pick it up standing up straight. Use left hands to move a light non-breakable item to chair bottom on your left side then pick it up standing up straight.    04/11/2022: Prosthetic Training with TFA prosthesis: PT instructed wife & pt in differences in rollator walker designs to consider when purchasing one. They both verbalized understanding.  Pt ambulated 220' with rollator walker negotiating around obstacles with SBA. One balance loss with knee instability that he was able to correct himself.  Pt able to sit / stand on rollator seat in open area with supervision.   PT demo & verbal cues on neg ramp & curb with rollator walker.  Pt neg ramp & curb with SBA.  Neuromuscular Re-ed: Working on upright posture.   Supine hip flexor stretch hooklying with prosthesis over edge of bed 30 sec hold.   PT progressed to glut set / bridge with LLE/prosthetic foot on floor & RLE low mat table hooklying. 10 reps 2 sets.  Standing upright with posterior pelvis to counter with UEs on counter so ears over shoulders over hips over feet.  PT recommended 1-2X/day building up how long he can stand upright. Standing with back to door frame reaching overhead for stretch single UE & BUEs 2 reps ea.  Pt verbalized understanding of above as HEP to work on his posture.    04/04/2022: Prosthetic Training with TFA prosthesis: He reports using socks correctly. Sit to/from stand working  on not needing RW for support to stabilize.  From 22" w/c with cushion & armrests, from 20" w/c without cushion and 18" chair without armrests using UEs. Pt able to transfer sit <>stand with verbal cues. PT demo & verbal cues on rationale for rollator walker use and safe use including sit<>stand.  Pt ambulated 100' with rollator walker negotiating around obstacles with CGA.  Pt able to sit / stand on rollator seat 1st time walker stabilized against wall & 2nd time in open area with supervision.   PT demo & verbal cues on neg ramp & curb with rollator walker.  Pt neg ramp & curb with CGA.     PATIENT EDUCATION: PATIENT EDUCATED ON FOLLOWING PROSTHETIC CARE: Education details:   Correct ply sock adjustment, Propper donning, and Proper wear schedule/adjustment Prosthetic wear tolerance: 4 hours 2x/day, 7 days/week Person educated: Patient and Spouse Education method: Explanation, Actor cues, and Verbal cues Education comprehension: verbalized understanding, verbal cues required, and needs further education   HOME EXERCISE PROGRAM: Access Code: [email or web address] URL: [email or web address] Date: 03/21/2022 Prepared by: Vladimir Faster  Exercises - Modified [place] Pian (Mirrored)  - 1-2 x daily - 7 x weekly - 1 sets - 2-3 reps - 20-30 seconds hold - wide stance head motions eyes open  - 1-2 x daily - 7 x weekly - 1 sets - 10 reps - 2 seconds hold - Feet Apart with Eyes Closed with Head Motions  - 1-2 x daily - 7 x weekly - 1 sets - 10 reps - 2 seconds hold    ASSESSMENT:   CLINICAL IMPRESSION: Pt appears safe for prosthetic gait with rollator walker.  Pt appears to understand PT recommendation to decrease w/c use in home.  Pt seems to understand HEP working on trunk motions 6 directions.    OBJECTIVE IMPAIRMENTS: Abnormal gait, cardiopulmonary status limiting activity, decreased activity tolerance, decreased balance, decreased endurance, decreased knowledge of use of DME, decreased  mobility, decreased ROM, decreased strength, postural dysfunction, prosthetic dependency , and pain.    ACTIVITY LIMITATIONS: standing, stairs, transfers, locomotion level, and balance   PARTICIPATION LIMITATIONS: community activity and church   PERSONAL FACTORS: Fitness, Time since onset of injury/illness/exacerbation, and 3+ comorbidities: see PMH  are also affecting patient's functional outcome.    REHAB POTENTIAL: Good   CLINICAL DECISION MAKING: Evolving/moderate complexity   EVALUATION COMPLEXITY: Moderate     GOALS: Goals reviewed with patient? Yes   SHORT TERM GOALS: Target date: 04/06/2022   Patient donnes new socket / prosthesis modified independent. Baseline: SEE OBJECTIVE DATA Goal status: MET 04/11/22 2.  Patient tolerates prosthesis >10 hrs total /day without skin issues or limb pain. Baseline: SEE OBJECTIVE DATA Goal status: MET 04/11/22   3.  Patient demonstrates understanding of initial HEP.  Baseline: SEE OBJECTIVE DATA Goal status: MET 04/11/22   LONG TERM GOALS: Target date: 05/31/2022   Patient demonstrates & verbalized understanding of prosthetic care to enable safe utilization of prosthesis. Baseline: SEE OBJECTIVE DATA Goal status: INITIAL   Patient tolerates daily prosthesis wear >50% of awake hours without skin or limb pain issues. Baseline: SEE OBJECTIVE DATA Goal status: INITIAL   Berg Balance >/= 18/56 indicate lower fall risk Baseline: SEE OBJECTIVE DATA Goal status: INITIAL   Patient ambulates >400' with prosthesis & RW modified independent Baseline: SEE OBJECTIVE DATA Goal status: INITIAL   Patient negotiates ramps, curbs with RW & stairs with single rail / cane with prosthesis modified independent. Baseline: SEE OBJECTIVE DATA Goal status: INITIAL   Patient verbalizes & demonstrates understanding of ongoing  HEP. Baseline: SEE OBJECTIVE DATA Goal status: INITIAL   PLAN:   PT FREQUENCY: 1x/week   PT DURATION: 12 weeks   PLANNED  INTERVENTIONS: Therapeutic exercises, Therapeutic activity, Neuromuscular re-education, Balance training, Gait training, Patient/Family education, Self Care, Stair training, Vestibular training, Prosthetic training, and DME instructions   PLAN FOR NEXT SESSION:  set updated STGs, check on standing trunk exercises.  work towards [employer], balance activities    Vladimir Faster, PT, DPT 04/18/2022, 10:51 AM

## 2022-04-18 NOTE — Patient Instructions (Addendum)
04/18/2022 exercises  Stand with chair behind you and counter / walker in front of you Use both hands to move a light non-breakable item over head. Watch with eyes and back leans backward slight amount. 10 reps Use both hands to move a light non-breakable item to your right, back to front (stand up straight), to your left and back to front.  10 reps Use both hands to move a light non-breakable item to chair bottom in front of you then pick it up standing up straight. Use right hand to move a light non-breakable item to chair bottom on your right side then pick it up standing up straight. Use left hands to move a light non-breakable item to chair bottom on your left side then pick it up standing up straight.

## 2022-04-25 ENCOUNTER — Encounter: Payer: Self-pay | Admitting: Physical Therapy

## 2022-04-25 ENCOUNTER — Ambulatory Visit (INDEPENDENT_AMBULATORY_CARE_PROVIDER_SITE_OTHER): Payer: 59 | Admitting: Physical Therapy

## 2022-04-25 DIAGNOSIS — R293 Abnormal posture: Secondary | ICD-10-CM | POA: Diagnosis not present

## 2022-04-25 DIAGNOSIS — M6281 Muscle weakness (generalized): Secondary | ICD-10-CM | POA: Diagnosis not present

## 2022-04-25 DIAGNOSIS — R2681 Unsteadiness on feet: Secondary | ICD-10-CM | POA: Diagnosis not present

## 2022-04-25 DIAGNOSIS — R2689 Other abnormalities of gait and mobility: Secondary | ICD-10-CM | POA: Diagnosis not present

## 2022-04-25 DIAGNOSIS — M25652 Stiffness of left hip, not elsewhere classified: Secondary | ICD-10-CM

## 2022-04-25 NOTE — Patient Instructions (Signed)
Standing at sink with chair backs to right & left and wheelchair behind you. With both hands place on object to upper shelf. With both hands turn to place object on back of each chair. With one hand lean to side place object in bottom of each chair on your sides. Turn around so sink is ~2" behind your bottom, place object in seat of w/c. Face sink, move prosthesis across in front of right leg. And back beside right leg. Move prosthesis forward try to hold position for 2-3 seconds without touching. And back beside right leg. Move prosthesis out to side try to hold position for 2-3 seconds without touching. And back beside right leg. Move prosthesis back behind you try to hold position for 2-3 seconds without touching. And back beside right leg. Move right leg across in front of prosthesis. try to hold position for 2-3 seconds without touching. And back beside left leg. Move right leg forward try to hold position for 2-3 seconds without touching. And back beside left leg. Move right leg to side try to hold position for 2-3 seconds without touching. And back beside left leg. Move right leg back behind you try to hold position for 2-3 seconds without touching. And back beside left leg. Walk forward & backward with cane in right hand. 1st lap touching counter on top, 2nd lap touching counter with back of hand, 3rd lap not touching counter.  Walk to right & left side stepping (make sure you put weight on prosthesis prior to stepping right leg) with cane in right hand. 1st lap touching counter on top, 2nd lap touching counter with back of hand, 3rd lap not touching counter.

## 2022-04-25 NOTE — Therapy (Signed)
OUTPATIENT PHYSICAL THERAPY TREATMENT NOTE   Patient Name: Samuel Carney MRN: [id number] DOB:June 19, 1945, 77 y.o., male Today's Date: 04/25/2022  PCP: Juluis Pitch, MD  REFERRING PROVIDER: Jamse Arn, MD at [place]:   PT End of Session - 04/25/22 0922     Visit Number 6    Number of Visits 13    Date for PT Re-Evaluation 05/31/22    Authorization Type [employer] Medicare    Authorization Time Period 20% co-insurance    Progress Note Due on Visit 10    PT Start Time 0922    PT Stop Time 1018    PT Time Calculation (min) 56 min    Equipment Utilized During Treatment Gait belt    Activity Tolerance Patient tolerated treatment well;Patient limited by fatigue    Behavior During Therapy WFL for tasks assessed/performed                 Past Medical History:  Diagnosis Date   Alcohol abuse    Aortic atherosclerosis ([place])    B12 deficiency    Cirrhosis (HCC)    COPD (chronic obstructive pulmonary disease) ([place])    Coronary artery disease    GERD (gastroesophageal reflux disease)    Glaucoma    Grade II diastolic dysfunction    History of kidney stones    HLD (hyperlipidemia)    Hx of radiation therapy    Hypertension    Lung mass    Moderate mitral regurgitation    Pneumonia    PVD (peripheral vascular disease) ([place])    Squamous cell carcinoma of lung, right ([place]) 2019   Past Surgical History:  Procedure Laterality Date   COLONOSCOPY WITH PROPOFOL     COLONOSCOPY WITH PROPOFOL N/A 11/25/2018   Procedure: COLONOSCOPY WITH PROPOFOL;  Surgeon: Lollie Sails, MD;  Location: [facility] ENDOSCOPY;  Service: Endoscopy;  Laterality: N/A;   COLONOSCOPY WITH PROPOFOL N/A 11/29/2021   Procedure: COLONOSCOPY WITH PROPOFOL;  Surgeon: Toledo, Benay Pike, MD;  Location: [facility] ENDOSCOPY;  Service: Gastroenterology;  Laterality: N/A;   ENDARTERECTOMY FEMORAL Left 07/13/2020   Procedure: ENDARTERECTOMY FEMORAL ( SFA STENT);  Surgeon: Katha Cabal, MD;   Location: [facility] ORS;  Service: Vascular;  Laterality: Left;   ESOPHAGOGASTRODUODENOSCOPY (EGD) WITH PROPOFOL N/A 11/25/2018   Procedure: ESOPHAGOGASTRODUODENOSCOPY (EGD) WITH PROPOFOL;  Surgeon: Lollie Sails, MD;  Location: [facility] ENDOSCOPY;  Service: Endoscopy;  Laterality: N/A;   ESOPHAGOGASTRODUODENOSCOPY (EGD) WITH PROPOFOL N/A 11/29/2021   Procedure: ESOPHAGOGASTRODUODENOSCOPY (EGD) WITH PROPOFOL;  Surgeon: Toledo, Benay Pike, MD;  Location: [facility] ENDOSCOPY;  Service: Gastroenterology;  Laterality: N/A;   ESOPHAGOGASTRODUODENOSCOPY (EGD) WITH PROPOFOL N/A 12/08/2021   Procedure: ESOPHAGOGASTRODUODENOSCOPY (EGD) WITH PROPOFOL;  Surgeon: [name], MD;  Location: [facility] ENDOSCOPY;  Service: Gastroenterology;  Laterality: N/A;   LEG AMPUTATION ABOVE KNEE Left    LOWER EXTREMITY ANGIOGRAPHY Left 06/07/2020   Procedure: LOWER EXTREMITY ANGIOGRAPHY;  Surgeon: Katha Cabal, MD;  Location: [facility] LAB;  Service: Cardiovascular;  Laterality: Left;   LOWER EXTREMITY ANGIOGRAPHY Left 01/10/2021   Procedure: LOWER EXTREMITY ANGIOGRAPHY;  Surgeon: Katha Cabal, MD;  Location: [facility];  Service: Cardiovascular;  Laterality: Left;   Patient Active Problem List   Diagnosis Date Noted   UGIB (upper gastrointestinal bleed)    Malnutrition of moderate degree 12/06/2021   Melena 12/05/2021   COPD (chronic obstructive pulmonary disease) (HCC)    Grade II diastolic dysfunction    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), left (HCC)    AKI (acute kidney injury) ([place])    Atherosclerosis of artery of extremity with ulceration ([place]) 07/13/2020   Atherosclerosis of native arteries of the extremities with ulceration ([place]) 15/17/6160   Alcoholic cirrhosis of liver without ascites ([place]) 04/09/2019   Hyperlipidemia, mixed 04/09/2019   Malignant 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) 04/09/2019   Thrombocytopenia ([place]) 04/09/2019   CAD (coronary artery disease) 10/14/2017    Lung cancer ([place]) 10/14/2017   Tobacco abuse 08/14/2017   Benign essential hypertension 09/09/2013    REFERRING DIAG: V37.106 Acquired absence of left leg above knee  ONSET DATE: 02/16/2022 new prosthesis delivery    THERAPY DIAG:  Unsteadiness on feet  Muscle weakness (generalized)  Abnormal posture  Other abnormalities of gait and mobility  Stiffness of left hip, not elsewhere classified  Rationale for Evaluation and Treatment Rehabilitation  PERTINENT HISTORY: PAD, CAD, angina, malignant neoplasm of right lung, HLD, alcoholic cirrhosis of liver, HTN, COPD   PRECAUTIONS: Fall  SUBJECTIVE:                                                                                                                                                                                      SUBJECTIVE STATEMENT:   They are still checking on rollator walker with 10" wheels trying to find less expensive option.  He donnes prosthesis ~10 but arises 7-7:30ish. He doffes with evening bath ~7-8 going to bed 9-10.  He has been trying to sit in regular chair more using PT recommendation. Pt & wife report he is walking more now in house. He has been doing the new trunk exercises but continues to need to touch.     PAIN:  Are you having pain? Yes: NPRS scale: standing 0/10 Pain location: lumbar right & left sides Pain description: sore with muscles spasm Aggravating factors: standing & walking Relieving factors: sitting  OBJECTIVE: (objective measures completed at initial evaluation unless otherwise dated)  COGNITION: Overall cognitive status: Within functional limits for tasks assessed   POSTURE: rounded shoulders, forward head, flexed trunk , and weight shift right   LOWER EXTREMITY ROM:   ROM P:passive  A:active Right eval Left eval Left 04/11/22  Hip flexion       Hip extension   Standing A: -17* Standing A: -13*  Hip abduction       Hip adduction       Hip internal rotation       Hip  external rotation       Knee flexion       Knee extension       Ankle dorsiflexion  Ankle plantarflexion       Ankle inversion       Ankle eversion        (Blank rows = not tested)   LOWER EXTREMITY MMT:   MMT Right eval Left eval  Hip flexion      Hip extension   4-/5  Hip abduction   4-/5  Hip adduction      Hip internal rotation      Hip external rotation   NA  Knee flexion 4/5 NA  Knee extension 4/5 NA  Ankle dorsiflexion   NA  Ankle plantarflexion   NA  Ankle inversion   NA  Ankle eversion   NA  (Blank rows = not tested)   TRANSFERS: Sit to stand: SBA requires use of UEs from chair without armrests to RW for stabilization.  Stand to sit: SBA requires use of UEs to chair without armrests from RW for stability.    GAIT: Gait pattern: step to pattern, decreased step length- Right, decreased stance time- Left, circumduction- Left, Left hip hike, knee flexed in stance- Right, and trunk flexed Distance walked: 125' Assistive device utilized: Environmental consultant - 2 wheeled & TFA prosthesis Level of assistance: SBA Gait velocity: 1.29 ft/sec Comments: pt amb 20' with cane stand alone tip with modA.    RAMP pt neg 12* ramp with RW & TFA prosthesis with SBA   CURB: pt neg 6" curb with RW & TFA prosthesis with SBA   FUNCTIONAL TESTs:  Berg Balance Scale: 14/56   [facility] PT Assessment - 03/06/22 1100                Standardized Balance Assessment    Standardized Balance Assessment Berg Balance Test          Berg Balance Test    Sit to Stand Needs minimal aid to stand or to stabilize   needs minA to stabilize or RW support    Standing Unsupported Needs several tries to stand 30 seconds unsupported     Sitting with Back Unsupported but Feet Supported on Floor or Stool Able to sit safely and securely 2 minutes     Stand to Sit Controls descent by using hands     Transfers Able to transfer safely, definite need of hands     Standing Unsupported with Eyes Closed Needs help to  keep from falling     Standing Unsupported with Feet Together Needs help to attain position and unable to hold for 15 seconds     From Standing, Reach Forward with Outstretched Arm Reaches forward but needs supervision     From Standing Position, Pick up Object from Floor Unable to try/needs assist to keep balance     From Standing Position, Turn to Look Behind Over each Shoulder Needs supervision when turning     Turn 360 Degrees Needs assistance while turning     Standing Unsupported, Alternately Place Feet on Step/Stool Needs assistance to keep from falling or unable to try     Standing Unsupported, One Foot in [employer] balance while stepping or standing     Standing on One Leg Unable to try or needs assist to prevent fall     Total Score 14                     CURRENT PROSTHETIC WEAR ASSESSMENT: Patient is independent with: skin check, residual limb care, and prosthetic cleaning Patient is dependent with: correct ply sock adjustment, proper wear schedule/adjustment, and  proper weight-bearing schedule/adjustment Donning prosthesis: SBA with new suspension Doffing prosthesis: Modified independence Prosthetic wear tolerance: up to 5 hours, 1x/day, 14 of 19 days since delivery Prosthetic weight bearing tolerance: 5 minutes Edema: none Residual limb condition: pt & wife report no issues Prosthetic description: silicon liner with suction ring suspension, total contact socket with flexible inner liner, SAFETY knee, single axis foot K code/activity level with prosthetic use: Level 2       TODAY'S TREATMENT:                                                                                                                             DATE:  04/25/2022: PT instructed with verbal, tactile  & HO (see pt instructions) in updated HEP. Pt return demo & pt/wife verbalized understanding.    Standing at sink with chair backs to right & left and wheelchair behind you. With both hands place on  object to upper shelf. With both hands turn to place object on back of each chair. With one hand lean to side place object in bottom of each chair on your sides. Turn around so sink is ~2" behind your bottom, place object in seat of w/c. Face sink, move prosthesis across in front of right leg. And back beside right leg. Move prosthesis forward try to hold position for 2-3 seconds without touching. And back beside right leg. Move prosthesis out to side try to hold position for 2-3 seconds without touching. And back beside right leg. Move prosthesis back behind you try to hold position for 2-3 seconds without touching. And back beside right leg. Move right leg across in front of prosthesis. try to hold position for 2-3 seconds without touching. And back beside left leg. Move right leg forward try to hold position for 2-3 seconds without touching. And back beside left leg. Move right leg to side try to hold position for 2-3 seconds without touching. And back beside left leg. Move right leg back behind you try to hold position for 2-3 seconds without touching. And back beside left leg. Walk forward & backward with cane in right hand. 1st lap touching counter on top, 2nd lap touching counter with back of hand, 3rd lap not touching counter.  Walk to right & left side stepping (make sure you put weight on prosthesis prior to stepping right leg) with cane in right hand. 1st lap touching counter on top, 2nd lap touching counter with back of hand, 3rd lap not touching counter.   04/18/2022: Prosthetic Training with TFA prosthesis: Pt amb 100' including sit / stand with rollator walker and neg ramp/curb safely without PT cueing needed. PT demo & verbal cues on moving chair forward, backward, turning right & left like moving to/from dining room table.  Pt verbalized & return demo understanding.   PT recommending not sitting in w/c inside home so he walks to change locations.  Pt & wife verbalized  understanding.    Neuromuscular Re-ed:  Standing activities noted below. Pt performed under PT supervision & instruction. PT issued HO for HEP. Pt & wife verbalized understanding.   Stand with chair behind you and counter / walker in front of you Use both hands to move a light non-breakable item over head. Watch with eyes and back leans backward slight amount. 10 reps Use both hands to move a light non-breakable item to your right, back to front (stand up straight), to your left and back to front.  10 reps Use both hands to move a light non-breakable item to chair bottom in front of you then pick it up standing up straight. Use right hand to move a light non-breakable item to chair bottom on your right side then pick it up standing up straight. Use left hands to move a light non-breakable item to chair bottom on your left side then pick it up standing up straight.    04/11/2022: Prosthetic Training with TFA prosthesis: PT instructed wife & pt in differences in rollator walker designs to consider when purchasing one. They both verbalized understanding.  Pt ambulated 220' with rollator walker negotiating around obstacles with SBA. One balance loss with knee instability that he was able to correct himself.  Pt able to sit / stand on rollator seat in open area with supervision.   PT demo & verbal cues on neg ramp & curb with rollator walker.  Pt neg ramp & curb with SBA.  Neuromuscular Re-ed: Working on upright posture.   Supine hip flexor stretch hooklying with prosthesis over edge of bed 30 sec hold.   PT progressed to glut set / bridge with LLE/prosthetic foot on floor & RLE low mat table hooklying. 10 reps 2 sets.  Standing upright with posterior pelvis to counter with UEs on counter so ears over shoulders over hips over feet.  PT recommended 1-2X/day building up how long he can stand upright. Standing with back to door frame reaching overhead for stretch single UE & BUEs 2 reps ea.  Pt  verbalized understanding of above as HEP to work on his posture.     PATIENT EDUCATION: PATIENT EDUCATED ON FOLLOWING PROSTHETIC CARE: Education details:   Correct ply sock adjustment, Propper donning, and Proper wear schedule/adjustment Prosthetic wear tolerance: 4 hours 2x/day, 7 days/week Person educated: Patient and Spouse Education method: Explanation, Corporate treasurer cues, and Verbal cues Education comprehension: verbalized understanding, verbal cues required, and needs further education   HOME EXERCISE PROGRAM: Standing at sink with chair backs to right & left and wheelchair behind you. With both hands place on object to upper shelf. With both hands turn to place object on back of each chair. With one hand lean to side place object in bottom of each chair on your sides. Turn around so sink is ~2" behind your bottom, place object in seat of w/c. Face sink, move prosthesis across in front of right leg. And back beside right leg. Move prosthesis forward try to hold position for 2-3 seconds without touching. And back beside right leg. Move prosthesis out to side try to hold position for 2-3 seconds without touching. And back beside right leg. Move prosthesis back behind you try to hold position for 2-3 seconds without touching. And back beside right leg. Move right leg across in front of prosthesis. try to hold position for 2-3 seconds without touching. And back beside left leg. Move right leg forward try to hold position for 2-3 seconds without touching. And back beside left leg. Move right leg to  side try to hold position for 2-3 seconds without touching. And back beside left leg. Move right leg back behind you try to hold position for 2-3 seconds without touching. And back beside left leg. Walk forward & backward with cane in right hand. 1st lap touching counter on top, 2nd lap touching counter with back of hand, 3rd lap not touching counter.  Walk to right & left side stepping (make sure you  put weight on prosthesis prior to stepping right leg) with cane in right hand. 1st lap touching counter on top, 2nd lap touching counter with back of hand, 3rd lap not touching counter.   Access Code: [email or web address] URL: [email or web address] Date: 03/21/2022 Prepared by: Jamey Reas  Exercises - Modified [place] [employer] (Mirrored)  - 1-2 x daily - 7 x weekly - 1 sets - 2-3 reps - 20-30 seconds hold - wide stance head motions eyes open  - 1-2 x daily - 7 x weekly - 1 sets - 10 reps - 2 seconds hold - Feet Apart with Eyes Closed with Head Motions  - 1-2 x daily - 7 x weekly - 1 sets - 10 reps - 2 seconds hold    ASSESSMENT:   CLINICAL IMPRESSION: Pt appears to understand updated HEP as noted.  It near sink for safety.   Pt is on target to meet STGs next week.     OBJECTIVE IMPAIRMENTS: Abnormal gait, cardiopulmonary status limiting activity, decreased activity tolerance, decreased balance, decreased endurance, decreased knowledge of use of DME, decreased mobility, decreased ROM, decreased strength, postural dysfunction, prosthetic dependency , and pain.    ACTIVITY LIMITATIONS: standing, stairs, transfers, locomotion level, and balance   PARTICIPATION LIMITATIONS: community activity and church   PERSONAL FACTORS: Fitness, Time since onset of injury/illness/exacerbation, and 3+ comorbidities: see PMH  are also affecting patient's functional outcome.    REHAB POTENTIAL: Good   CLINICAL DECISION MAKING: Evolving/moderate complexity   EVALUATION COMPLEXITY: Moderate     GOALS: Goals reviewed with patient? Yes   SHORT TERM GOALS: Target date: 05/04/2022   Patient reports walking in home >50% of time to change locations.  Baseline: SEE OBJECTIVE DATA Goal status: ongoing 04/25/2022 2.  Patient amb >200' with rollator walker on level surfaces & negotiates ramps / curbs safely. Baseline: SEE OBJECTIVE DATA Goal status: ongoing 04/25/2022   3.  Patient demonstrates  understanding of updated HEP.  Baseline: SEE OBJECTIVE DATA Goal status: ongoing 04/25/2022   LONG TERM GOALS: Target date: 05/31/2022   Patient demonstrates & verbalized understanding of prosthetic care to enable safe utilization of prosthesis. Baseline: SEE OBJECTIVE DATA Goal status: INITIAL   Patient tolerates daily prosthesis wear >50% of awake hours without skin or limb pain issues. Baseline: SEE OBJECTIVE DATA Goal status: INITIAL   Berg Balance >/= 18/56 indicate lower fall risk Baseline: SEE OBJECTIVE DATA Goal status: INITIAL   Patient ambulates >400' with prosthesis & RW modified independent Baseline: SEE OBJECTIVE DATA Goal status: INITIAL   Patient negotiates ramps, curbs with RW & stairs with single rail / cane with prosthesis modified independent. Baseline: SEE OBJECTIVE DATA Goal status: INITIAL   Patient verbalizes & demonstrates understanding of ongoing HEP. Baseline: SEE OBJECTIVE DATA Goal status: INITIAL   PLAN:   PT FREQUENCY: 1x/week   PT DURATION: 12 weeks   PLANNED INTERVENTIONS: Therapeutic exercises, Therapeutic activity, Neuromuscular re-education, Balance training, Gait training, Patient/Family education, Self Care, Stair training, Vestibular training, Prosthetic training, and DME instructions   PLAN FOR NEXT SESSION: check updated STGs,  work towards [employer], balance activities    Jamey Reas, PT, DPT 04/25/2022, 11:08 AM

## 2022-05-02 ENCOUNTER — Encounter: Payer: Self-pay | Admitting: Physical Therapy

## 2022-05-02 ENCOUNTER — Ambulatory Visit (INDEPENDENT_AMBULATORY_CARE_PROVIDER_SITE_OTHER): Payer: 59 | Admitting: Physical Therapy

## 2022-05-02 DIAGNOSIS — R2681 Unsteadiness on feet: Secondary | ICD-10-CM | POA: Diagnosis not present

## 2022-05-02 DIAGNOSIS — M6281 Muscle weakness (generalized): Secondary | ICD-10-CM

## 2022-05-02 DIAGNOSIS — R293 Abnormal posture: Secondary | ICD-10-CM | POA: Diagnosis not present

## 2022-05-02 DIAGNOSIS — R2689 Other abnormalities of gait and mobility: Secondary | ICD-10-CM

## 2022-05-02 DIAGNOSIS — M25652 Stiffness of left hip, not elsewhere classified: Secondary | ICD-10-CM

## 2022-05-02 NOTE — Therapy (Signed)
OUTPATIENT PHYSICAL THERAPY TREATMENT NOTE   Patient Name: Samuel Carney MRN: [id number] DOB:1945-04-14, 77 y.o., male Today's Date: 05/02/2022  PCP: Juluis Pitch, MD  REFERRING PROVIDER: Jamse Arn, MD at [facility]:   PT End of Session - 05/02/22 0853     Visit Number 7    Number of Visits 13    Date for PT Re-Evaluation 05/31/22    Authorization Type [employer] Medicare    Authorization Time Period 20% co-insurance    Progress Note Due on Visit 10    PT Start Time 0850    PT Stop Time 0929    PT Time Calculation (min) 39 min    Equipment Utilized During Treatment Gait belt    Activity Tolerance Patient tolerated treatment well;Patient limited by fatigue    Behavior During Therapy WFL for tasks assessed/performed                  Past Medical History:  Diagnosis Date   Alcohol abuse    Aortic atherosclerosis ([place])    B12 deficiency    Cirrhosis ([place])    COPD (chronic obstructive pulmonary disease) ([place])    Coronary artery disease    GERD (gastroesophageal reflux disease)    Glaucoma    Grade II diastolic dysfunction    History of kidney stones    HLD (hyperlipidemia)    Hx of radiation therapy    Hypertension    Lung mass    Moderate mitral regurgitation    Pneumonia    PVD (peripheral vascular disease) ([place])    Squamous cell carcinoma of lung, right ([place]) 2019   Past Surgical History:  Procedure Laterality Date   COLONOSCOPY WITH PROPOFOL     COLONOSCOPY WITH PROPOFOL N/A 11/25/2018   Procedure: COLONOSCOPY WITH PROPOFOL;  Surgeon: Lollie Sails, MD;  Location[facility] ENDOSCOPY;  Service: Endoscopy;  Laterality: N/A;   COLONOSCOPY WITH PROPOFOL N/A 11/29/2021   Procedure: COLONOSCOPY WITH PROPOFOL;  Surgeon: Toledo, Benay Pike, MD;  Location: [facility] ENDOSCOPY;  Service: Gastroenterology;  Laterality: N/A;   ENDARTERECTOMY FEMORAL Left 07/13/2020   Procedure: ENDARTERECTOMY FEMORAL ( SFA STENT);  Surgeon: Katha Cabal, MD;   Location: [facility] ORS;  Service: Vascular;  Laterality: Left;   ESOPHAGOGASTRODUODENOSCOPY (EGD) WITH PROPOFOL N/A 11/25/2018   Procedure: ESOPHAGOGASTRODUODENOSCOPY (EGD) WITH PROPOFOL;  Surgeon: Lollie Sails, MD;  Location: [facility] ENDOSCOPY;  Service: Endoscopy;  Laterality: N/A;   ESOPHAGOGASTRODUODENOSCOPY (EGD) WITH PROPOFOL N/A 11/29/2021   Procedure: ESOPHAGOGASTRODUODENOSCOPY (EGD) WITH PROPOFOL;  Surgeon: Toledo, Benay Pike, MD;  Location: [facility] ENDOSCOPY;  Service: Gastroenterology;  Laterality: N/A;   ESOPHAGOGASTRODUODENOSCOPY (EGD) WITH PROPOFOL N/A 12/08/2021   Procedure: ESOPHAGOGASTRODUODENOSCOPY (EGD) WITH PROPOFOL;  Surgeon: [name], MD;  Location: [facility] ENDOSCOPY;  Service: Gastroenterology;  Laterality: N/A;   LEG AMPUTATION ABOVE KNEE Left    LOWER EXTREMITY ANGIOGRAPHY Left 06/07/2020   Procedure: LOWER EXTREMITY ANGIOGRAPHY;  Surgeon: Katha Cabal, MD;  Location: [facility] CV LAB;  Service: Cardiovascular;  Laterality: Left;   LOWER EXTREMITY ANGIOGRAPHY Left 01/10/2021   Procedure: LOWER EXTREMITY ANGIOGRAPHY;  Surgeon: Katha Cabal, MD;  Location: [facility] CV LAB;  Service: Cardiovascular;  Laterality: Left;   Patient Active Problem List   Diagnosis Date Noted   UGIB (upper gastrointestinal bleed)    Malnutrition of moderate degree 12/06/2021   Melena 12/05/2021   COPD (chronic obstructive pulmonary disease) (HCC)    Grade II diastolic dysfunction    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), left ([facility])    AKI (acute kidney injury) ([place])    Atherosclerosis of artery of extremity with ulceration ([place]) 07/13/2020   Atherosclerosis of native arteries of the extremities with ulceration ([place]) 96/28/3662   Alcoholic cirrhosis of liver without ascites ([place]) 04/09/2019   Hyperlipidemia, mixed 04/09/2019   Malignant 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 ([place]) 04/09/2019   Thrombocytopenia ([place]) 04/09/2019   CAD (coronary artery disease) 10/14/2017    Lung cancer ([place]) 10/14/2017   Tobacco abuse 08/14/2017   Benign essential hypertension 09/09/2013    REFERRING DIAG: H47.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  ONSET DATE: 02/16/2022 new prosthesis delivery    THERAPY DIAG:  Unsteadiness on feet  Muscle weakness (generalized)  Abnormal posture  Other abnormalities of gait and mobility  Stiffness of left hip, not elsewhere classified  Rationale for Evaluation and Treatment Rehabilitation  PERTINENT HISTORY: PAD, CAD, angina, malignant neoplasm of right lung, HLD, alcoholic cirrhosis of liver, HTN, COPD   PRECAUTIONS: Fall  SUBJECTIVE:                                                                                                                                                                                      SUBJECTIVE STATEMENT:   He has been doing exercises which is challenging.  He continues to look for rollator walker.  Denies falls.    PAIN:  Are you having pain? Yes: NPRS scale: standing 0/10 Pain location: lumbar right & left sides Pain description: sore with muscles spasm Aggravating factors: standing & walking Relieving factors: sitting  OBJECTIVE: (objective measures completed at initial evaluation unless otherwise dated)  COGNITION: Overall cognitive status: Within functional limits for tasks assessed   POSTURE: rounded shoulders, forward head, flexed trunk , and weight shift right   LOWER EXTREMITY ROM:   ROM P:passive  A:active Right eval Left eval Left 04/11/22  Hip flexion       Hip extension   Standing A: -17* Standing A: -13*  Hip abduction       Hip adduction       Hip internal rotation       Hip external rotation       Knee flexion       Knee extension       Ankle dorsiflexion       Ankle plantarflexion       Ankle inversion       Ankle eversion        (Blank rows = not tested)   LOWER EXTREMITY MMT:   MMT Right eval Left eval  Hip flexion      Hip  extension   4-/5   Hip abduction   4-/5  Hip adduction      Hip internal rotation      Hip external rotation   NA  Knee flexion 4/5 NA  Knee extension 4/5 NA  Ankle dorsiflexion   NA  Ankle plantarflexion   NA  Ankle inversion   NA  Ankle eversion   NA  (Blank rows = not tested)   TRANSFERS: Sit to stand: SBA requires use of UEs from chair without armrests to RW for stabilization.  Stand to sit: SBA requires use of UEs to chair without armrests from RW for stability.    GAIT: 05/02/2022: 6 Minute Walk Test with rollator walker & TFA prosthesis 415'  Resting HR 63  post HR 86  Gait velocity: 1.50 ft/sec   18ft /min  Physiological Cost Index  0.25    03/07/2023: Gait pattern: step to pattern, decreased step length- Right, decreased stance time- Left, circumduction- Left, Left hip hike, knee flexed in stance- Right, and trunk flexed Distance walked: 125' Assistive device utilized: Environmental consultant - 2 wheeled & TFA prosthesis Level of assistance: SBA Gait velocity: 1.29 ft/sec Comments: pt amb 20' with cane stand alone tip with modA.    RAMP pt neg 12* ramp with RW & TFA prosthesis with SBA   CURB: pt neg 6" curb with RW & TFA prosthesis with SBA   FUNCTIONAL TESTs:  Berg Balance Scale: 14/56   [facility] PT Assessment - 03/06/22 1100                Standardized Balance Assessment    Standardized Balance Assessment Berg Balance Test          Berg Balance Test    Sit to Stand Needs minimal aid to stand or to stabilize   needs minA to stabilize or RW support    Standing Unsupported Needs several tries to stand 30 seconds unsupported     Sitting with Back Unsupported but Feet Supported on Floor or Stool Able to sit safely and securely 2 minutes     Stand to Sit Controls descent by using hands     Transfers Able to transfer safely, definite need of hands     Standing Unsupported with Eyes Closed Needs help to keep from falling     Standing Unsupported with Feet Together Needs help to attain position and  unable to hold for 15 seconds     From Standing, Reach Forward with Outstretched Arm Reaches forward but needs supervision     From Standing Position, Pick up Object from Floor Unable to try/needs assist to keep balance     From Standing Position, Turn to Look Behind Over each Shoulder Needs supervision when turning     Turn 360 Degrees Needs assistance while turning     Standing Unsupported, Alternately Place Feet on Step/Stool Needs assistance to keep from falling or unable to try     Standing Unsupported, One Foot in [employer] balance while stepping or standing     Standing on One Leg Unable to try or needs assist to prevent fall     Total Score 14                     CURRENT PROSTHETIC WEAR ASSESSMENT: Patient is independent with: skin check, residual limb care, and prosthetic cleaning Patient is dependent with: correct ply sock adjustment, proper wear schedule/adjustment, and proper weight-bearing schedule/adjustment Donning prosthesis: SBA with new suspension Doffing prosthesis:  Modified independence Prosthetic wear tolerance: up to 5 hours, 1x/day, 14 of 19 days since delivery Prosthetic weight bearing tolerance: 5 minutes Edema: none Residual limb condition: pt & wife report no issues Prosthetic description: silicon liner with suction ring suspension, total contact socket with flexible inner liner, SAFETY knee, single axis foot K code/activity level with prosthetic use: Level 2       TODAY'S TREATMENT:                                                                                                                             DATE:  05/02/2022: Prosthetic Training with TFA prosthesis: He reports walking with RW vs w/c >50% of time in home and some to go into restaurants.  PT instructed pt & wife in walking program of short (room to room) increasing frequency within day;  long (max tolerable distance) 1x/day;  medium (bw short & long - maybe to/from car or to mailbox)  >4x/day. Pt & wife verbalized understanding. See objective data for gait assessment. Pt neg ramp & curb with rollator walker with one minor correction for lifting rollator up curb.     04/25/2022: PT instructed with verbal, tactile  & HO (see pt instructions) in updated HEP. Pt return demo & pt/wife verbalized understanding.    Standing at sink with chair backs to right & left and wheelchair behind you. With both hands place on object to upper shelf. With both hands turn to place object on back of each chair. With one hand lean to side place object in bottom of each chair on your sides. Turn around so sink is ~2" behind your bottom, place object in seat of w/c. Face sink, move prosthesis across in front of right leg. And back beside right leg. Move prosthesis forward try to hold position for 2-3 seconds without touching. And back beside right leg. Move prosthesis out to side try to hold position for 2-3 seconds without touching. And back beside right leg. Move prosthesis back behind you try to hold position for 2-3 seconds without touching. And back beside right leg. Move right leg across in front of prosthesis. try to hold position for 2-3 seconds without touching. And back beside left leg. Move right leg forward try to hold position for 2-3 seconds without touching. And back beside left leg. Move right leg to side try to hold position for 2-3 seconds without touching. And back beside left leg. Move right leg back behind you try to hold position for 2-3 seconds without touching. And back beside left leg. Walk forward & backward with cane in right hand. 1st lap touching counter on top, 2nd lap touching counter with back of hand, 3rd lap not touching counter.  Walk to right & left side stepping (make sure you put weight on prosthesis prior to stepping right leg) with cane in right hand. 1st lap touching counter on top, 2nd lap touching counter with back of hand, 3rd lap not  touching counter.    04/18/2022: Prosthetic Training with TFA prosthesis: Pt amb 100' including sit / stand with rollator walker and neg ramp/curb safely without PT cueing needed. PT demo & verbal cues on moving chair forward, backward, turning right & left like moving to/from dining room table.  Pt verbalized & return demo understanding.   PT recommending not sitting in w/c inside home so he walks to change locations.  Pt & wife verbalized understanding.    Neuromuscular Re-ed: Standing activities noted below. Pt performed under PT supervision & instruction. PT issued HO for HEP. Pt & wife verbalized understanding.   Stand with chair behind you and counter / walker in front of you Use both hands to move a light non-breakable item over head. Watch with eyes and back leans backward slight amount. 10 reps Use both hands to move a light non-breakable item to your right, back to front (stand up straight), to your left and back to front.  10 reps Use both hands to move a light non-breakable item to chair bottom in front of you then pick it up standing up straight. Use right hand to move a light non-breakable item to chair bottom on your right side then pick it up standing up straight. Use left hands to move a light non-breakable item to chair bottom on your left side then pick it up standing up straight.     PATIENT EDUCATION: PATIENT EDUCATED ON FOLLOWING PROSTHETIC CARE: Education details:   Correct ply sock adjustment, Propper donning, and Proper wear schedule/adjustment Prosthetic wear tolerance: 4 hours 2x/day, 7 days/week Person educated: Patient and Spouse Education method: Explanation, Corporate treasurer cues, and Verbal cues Education comprehension: verbalized understanding, verbal cues required, and needs further education   HOME EXERCISE PROGRAM: Standing at sink with chair backs to right & left and wheelchair behind you. With both hands place on object to upper shelf. With both hands turn to place object on  back of each chair. With one hand lean to side place object in bottom of each chair on your sides. Turn around so sink is ~2" behind your bottom, place object in seat of w/c. Face sink, move prosthesis across in front of right leg. And back beside right leg. Move prosthesis forward try to hold position for 2-3 seconds without touching. And back beside right leg. Move prosthesis out to side try to hold position for 2-3 seconds without touching. And back beside right leg. Move prosthesis back behind you try to hold position for 2-3 seconds without touching. And back beside right leg. Move right leg across in front of prosthesis. try to hold position for 2-3 seconds without touching. And back beside left leg. Move right leg forward try to hold position for 2-3 seconds without touching. And back beside left leg. Move right leg to side try to hold position for 2-3 seconds without touching. And back beside left leg. Move right leg back behind you try to hold position for 2-3 seconds without touching. And back beside left leg. Walk forward & backward with cane in right hand. 1st lap touching counter on top, 2nd lap touching counter with back of hand, 3rd lap not touching counter.  Walk to right & left side stepping (make sure you put weight on prosthesis prior to stepping right leg) with cane in right hand. 1st lap touching counter on top, 2nd lap touching counter with back of hand, 3rd lap not touching counter.   Access Code: [email or web address] URL: [email or web address] Date: 03/21/2022 Prepared by:  Jamey Reas  Exercises - Modified Thomas Stretch (Mirrored)  - 1-2 x daily - 7 x weekly - 1 sets - 2-3 reps - 20-30 seconds hold - wide stance head motions eyes open  - 1-2 x daily - 7 x weekly - 1 sets - 10 reps - 2 seconds hold - Feet Apart with Eyes Closed with Head Motions  - 1-2 x daily - 7 x weekly - 1 sets - 10 reps - 2 seconds hold    ASSESSMENT:  CLINICAL IMPRESSION: Pt met all STGs.   He is able to ambulate further prior to muscle fatigue.  Pt continues to benefit from skilled PT.      OBJECTIVE IMPAIRMENTS: Abnormal gait, cardiopulmonary status limiting activity, decreased activity tolerance, decreased balance, decreased endurance, decreased knowledge of use of DME, decreased mobility, decreased ROM, decreased strength, postural dysfunction, prosthetic dependency , and pain.    ACTIVITY LIMITATIONS: standing, stairs, transfers, locomotion level, and balance   PARTICIPATION LIMITATIONS: community activity and church   PERSONAL FACTORS: Fitness, Time since onset of injury/illness/exacerbation, and 3+ comorbidities: see PMH  are also affecting patient's functional outcome.    REHAB POTENTIAL: Good   CLINICAL DECISION MAKING: Evolving/moderate complexity   EVALUATION COMPLEXITY: Moderate     GOALS: Goals reviewed with patient? Yes   SHORT TERM GOALS: Target date: 05/04/2022   Patient reports walking in home >50% of time to change locations.  Baseline: SEE OBJECTIVE DATA Goal status: MET 05/02/2022 2.  Patient amb >200' with rollator walker on level surfaces & negotiates ramps / curbs safely. Baseline: SEE OBJECTIVE DATA Goal status: MET 05/02/2022   3.  Patient demonstrates understanding of updated HEP.  Baseline: SEE OBJECTIVE DATA Goal status:  MET 05/02/2022   LONG TERM GOALS: Target date: 05/31/2022   Patient demonstrates & verbalized understanding of prosthetic care to enable safe utilization of prosthesis. Baseline: SEE OBJECTIVE DATA Goal status: INITIAL   Patient tolerates daily prosthesis wear >50% of awake hours without skin or limb pain issues. Baseline: SEE OBJECTIVE DATA Goal status: INITIAL   Berg Balance >/= 18/56 indicate lower fall risk Baseline: SEE OBJECTIVE DATA Goal status: INITIAL   Patient ambulates >400' with prosthesis & RW modified independent Baseline: SEE OBJECTIVE DATA Goal status: INITIAL   Patient negotiates ramps, curbs  with RW & stairs with single rail / cane with prosthesis modified independent. Baseline: SEE OBJECTIVE DATA Goal status: INITIAL   Patient verbalizes & demonstrates understanding of ongoing HEP. Baseline: SEE OBJECTIVE DATA Goal status: INITIAL   PLAN:   PT FREQUENCY: 1x/week   PT DURATION: 12 weeks   PLANNED INTERVENTIONS: Therapeutic exercises, Therapeutic activity, Neuromuscular re-education, Balance training, Gait training, Patient/Family education, Self Care, Stair training, Vestibular training, Prosthetic training, and DME instructions   PLAN FOR NEXT SESSION:  work towards LTGs, balance activities    Jamey Reas, PT, DPT 05/02/2022, 12:17 PM

## 2022-05-09 ENCOUNTER — Ambulatory Visit (INDEPENDENT_AMBULATORY_CARE_PROVIDER_SITE_OTHER): Payer: 59 | Admitting: Physical Therapy

## 2022-05-09 ENCOUNTER — Encounter: Payer: Self-pay | Admitting: Physical Therapy

## 2022-05-09 DIAGNOSIS — R2681 Unsteadiness on feet: Secondary | ICD-10-CM

## 2022-05-09 DIAGNOSIS — M6281 Muscle weakness (generalized): Secondary | ICD-10-CM

## 2022-05-09 DIAGNOSIS — R2689 Other abnormalities of gait and mobility: Secondary | ICD-10-CM | POA: Diagnosis not present

## 2022-05-09 DIAGNOSIS — R293 Abnormal posture: Secondary | ICD-10-CM | POA: Diagnosis not present

## 2022-05-09 DIAGNOSIS — M25652 Stiffness of left hip, not elsewhere classified: Secondary | ICD-10-CM

## 2022-05-09 NOTE — Therapy (Signed)
OUTPATIENT PHYSICAL THERAPY TREATMENT NOTE   Patient Name: Samuel Carney MRN: [id number] DOB:1945-09-20, 77 y.o., male Today's Date: 05/09/2022  PCP: Juluis Pitch, MD  REFERRING PROVIDER: Jamse Arn, MD at [place]:   PT End of Session - 05/09/22 0903     Visit Number 8    Number of Visits 13    Date for PT Re-Evaluation 05/31/22    Authorization Type [employer] Medicare    Authorization Time Period 20% co-insurance    Progress Note Due on Visit 10    PT Start Time 0901    PT Stop Time 0940    PT Time Calculation (min) 39 min    Equipment Utilized During Treatment Gait belt    Activity Tolerance Patient tolerated treatment well;Patient limited by fatigue    Behavior During Therapy WFL for tasks assessed/performed                   Past Medical History:  Diagnosis Date   Alcohol abuse    Aortic atherosclerosis ([place])    B12 deficiency    Cirrhosis ([place])    COPD (chronic obstructive pulmonary disease) ([place])    Coronary artery disease    GERD (gastroesophageal reflux disease)    Glaucoma    Grade II diastolic dysfunction    History of kidney stones    HLD (hyperlipidemia)    Hx of radiation therapy    Hypertension    Lung mass    Moderate mitral regurgitation    Pneumonia    PVD (peripheral vascular disease) ([place])    Squamous cell carcinoma of lung, right ([place]) 2019   Past Surgical History:  Procedure Laterality Date   COLONOSCOPY WITH PROPOFOL     COLONOSCOPY WITH PROPOFOL N/A 11/25/2018   Procedure: COLONOSCOPY WITH PROPOFOL;  Surgeon: Lollie Sails, MD;  Location: [facility] ENDOSCOPY;  Service: Endoscopy;  Laterality: N/A;   COLONOSCOPY WITH PROPOFOL N/A 11/29/2021   Procedure: COLONOSCOPY WITH PROPOFOL;  Surgeon: Toledo, Benay Pike, MD;  Location: [facility] ENDOSCOPY;  Service: Gastroenterology;  Laterality: N/A;   ENDARTERECTOMY FEMORAL Left 07/13/2020   Procedure: ENDARTERECTOMY FEMORAL ( SFA STENT);  Surgeon: Katha Cabal, MD;   Location: [facility] ORS;  Service: Vascular;  Laterality: Left;   ESOPHAGOGASTRODUODENOSCOPY (EGD) WITH PROPOFOL N/A 11/25/2018   Procedure: ESOPHAGOGASTRODUODENOSCOPY (EGD) WITH PROPOFOL;  Surgeon: Lollie Sails, MD;  Location: [facility] ENDOSCOPY;  Service: Endoscopy;  Laterality: N/A;   ESOPHAGOGASTRODUODENOSCOPY (EGD) WITH PROPOFOL N/A 11/29/2021   Procedure: ESOPHAGOGASTRODUODENOSCOPY (EGD) WITH PROPOFOL;  Surgeon: Toledo, Benay Pike, MD;  Location: [facility] ENDOSCOPY;  Service: Gastroenterology;  Laterality: N/A;   ESOPHAGOGASTRODUODENOSCOPY (EGD) WITH PROPOFOL N/A 12/08/2021   Procedure: ESOPHAGOGASTRODUODENOSCOPY (EGD) WITH PROPOFOL;  Surgeon: [name], MD;  Location: [facility] ENDOSCOPY;  Service: Gastroenterology;  Laterality: N/A;   LEG AMPUTATION ABOVE KNEE Left    LOWER EXTREMITY ANGIOGRAPHY Left 06/07/2020   Procedure: LOWER EXTREMITY ANGIOGRAPHY;  Surgeon: Katha Cabal, MD;  Location: [place] [facility] LAB;  Service: Cardiovascular;  Laterality: Left;   LOWER EXTREMITY ANGIOGRAPHY Left 01/10/2021   Procedure: LOWER EXTREMITY ANGIOGRAPHY;  Surgeon: Katha Cabal, MD;  Location: [place] CV LAB;  Service: Cardiovascular;  Laterality: Left;   Patient Active Problem List   Diagnosis Date Noted   UGIB (upper gastrointestinal bleed)    Malnutrition of moderate degree 12/06/2021   Melena 12/05/2021   COPD (chronic obstructive pulmonary disease) (H[facility])    Grade II diastolic dysfunction    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), left ([facility])    AKI (acute kidney injury) ([place])    Atherosclerosis of artery of extremity with ulceration ([place]) 07/13/2020   Atherosclerosis of native arteries of the extremities with ulceration ([place]) 14/48/1856   Alcoholic cirrhosis of liver without ascites ([place]) 04/09/2019   Hyperlipidemia, mixed 04/09/2019   Malignant 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 ([place]) 04/09/2019   Thrombocytopenia ([place]) 04/09/2019   CAD (coronary artery disease) 10/14/2017    Lung cancer ([place]) 10/14/2017   Tobacco abuse 08/14/2017   Benign essential hypertension 09/09/2013    REFERRING DIAG: D14.970 Acquired absence of left leg above knee  ONSET DATE: 02/16/2022 new prosthesis delivery    THERAPY DIAG:  Unsteadiness on feet  Muscle weakness (generalized)  Abnormal posture  Other abnormalities of gait and mobility  Stiffness of left hip, not elsewhere classified  Rationale for Evaluation and Treatment Rehabilitation  PERTINENT HISTORY: PAD, CAD, angina, malignant neoplasm of right lung, HLD, alcoholic cirrhosis of liver, HTN, COPD   PRECAUTIONS: Fall  SUBJECTIVE:                                                                                                                                                                                      SUBJECTIVE STATEMENT:  He has been walking more as PT advised.  His wife is pleased to not have to load w/c as much.  PAIN:  Are you having pain? Yes: NPRS scale: standing 0/10 Pain location: lumbar right & left sides Pain description: sore with muscles spasm Aggravating factors: standing & walking Relieving factors: sitting  OBJECTIVE: (objective measures completed at initial evaluation unless otherwise dated)  COGNITION: Overall cognitive status: Within functional limits for tasks assessed   POSTURE: rounded shoulders, forward head, flexed trunk , and weight shift right   LOWER EXTREMITY ROM:   ROM P:passive  A:active Left eval Left 04/11/22  Hip flexion     Hip extension Standing A: -17* Standing A: -13*  Hip abduction     Hip adduction     Hip internal rotation     Hip external rotation     Knee flexion     Knee extension     Ankle dorsiflexion     Ankle plantarflexion     Ankle inversion     Ankle eversion      (Blank rows = not tested)   LOWER EXTREMITY MMT:   MMT Right eval Left eval  Hip flexion      Hip extension   4-/5  Hip abduction   4-/5  Hip adduction      Hip  internal rotation  Hip external rotation   NA  Knee flexion 4/5 NA  Knee extension 4/5 NA  Ankle dorsiflexion   NA  Ankle plantarflexion   NA  Ankle inversion   NA  Ankle eversion   NA  (Blank rows = not tested)   TRANSFERS: 05/09/2022: Sit to / from stand using armrests of 18" chair and stabilizes without touching external support (close for safety).  03/07/2023:  Sit to stand: SBA requires use of UEs from chair without armrests to RW for stabilization.  Stand to sit: SBA requires use of UEs to chair without armrests from RW for stability.    GAIT: 05/02/2022: 6 Minute Walk Test with rollator walker & TFA prosthesis 415'  Resting HR 63  post HR 86  Gait velocity: 1.50 ft/sec   62ft /min  Physiological Cost Index  0.25    03/07/2023: Gait pattern: step to pattern, decreased step length- Right, decreased stance time- Left, circumduction- Left, Left hip hike, knee flexed in stance- Right, and trunk flexed Distance walked: 125' Assistive device utilized: Environmental consultant - 2 wheeled & TFA prosthesis Level of assistance: SBA Gait velocity: 1.29 ft/sec Comments: pt amb 20' with cane stand alone tip with modA.    RAMP pt neg 12* ramp with RW & TFA prosthesis with SBA   CURB: pt neg 6" curb with RW & TFA prosthesis with SBA   FUNCTIONAL TESTs:  Berg Balance Scale: 14/56   [facility] PT Assessment - 03/06/22 1100                Standardized Balance Assessment    Standardized Balance Assessment Berg Balance Test          Berg Balance Test    Sit to Stand Needs minimal aid to stand or to stabilize   needs minA to stabilize or RW support    Standing Unsupported Needs several tries to stand 30 seconds unsupported     Sitting with Back Unsupported but Feet Supported on Floor or Stool Able to sit safely and securely 2 minutes     Stand to Sit Controls descent by using hands     Transfers Able to transfer safely, definite need of hands     Standing Unsupported with Eyes Closed Needs help to keep  from falling     Standing Unsupported with Feet Together Needs help to attain position and unable to hold for 15 seconds     From Standing, Reach Forward with Outstretched Arm Reaches forward but needs supervision     From Standing Position, Pick up Object from Floor Unable to try/needs assist to keep balance     From Standing Position, Turn to Look Behind Over each Shoulder Needs supervision when turning     Turn 360 Degrees Needs assistance while turning     Standing Unsupported, Alternately Place Feet on Step/Stool Needs assistance to keep from falling or unable to try     Standing Unsupported, One Foot in [employer] balance while stepping or standing     Standing on One Leg Unable to try or needs assist to prevent fall     Total Score 14                     CURRENT PROSTHETIC WEAR ASSESSMENT: Patient is independent with: skin check, residual limb care, and prosthetic cleaning Patient is dependent with: correct ply sock adjustment, proper wear schedule/adjustment, and proper weight-bearing schedule/adjustment Donning prosthesis: SBA with new suspension Doffing prosthesis: Modified independence Prosthetic wear  tolerance: up to 5 hours, 1x/day, 14 of 19 days since delivery Prosthetic weight bearing tolerance: 5 minutes Edema: none Residual limb condition: pt & wife report no issues Prosthetic description: silicon liner with suction ring suspension, total contact socket with flexible inner liner, SAFETY knee, single axis foot K code/activity level with prosthetic use: Level 2       TODAY'S TREATMENT:                                                                                                                             DATE:  05/09/2022: Prosthetic Training with TFA prosthesis: PT reviewed walking program of short, medium & long (max tolerable distance) with encouragement for long 1x/day.  Walking report sounds like he is improving short with higher frequency & medium to access  community. Pt & wife verbalized understanding.  Pt amb 75' X 2 with cane stand alone tip & HHA / modA. PT reviewed how family to assist with HHA. PT demo & verbal cues on weight shift over prosthesis with right heel rise prior to stepping and looking forward (glancing not staring at floor). Pt verbalized understanding.  4-square step with back to counter & chair backs on ea side:  stepping CW & CCW first 2 reps with BUE support on chair backs & 3 reps with cane RUE / chair back LUE.  Pt & wife verbalized understanding including set up as HEP.    05/02/2022: Prosthetic Training with TFA prosthesis: He reports walking with RW vs w/c >50% of time in home and some to go into restaurants.  PT instructed pt & wife in walking program of short (room to room) increasing frequency within day;  long (max tolerable distance) 1x/day;  medium (bw short & long - maybe to/from car or to mailbox) >4x/day. Pt & wife verbalized understanding. See objective data for gait assessment. Pt neg ramp & curb with rollator walker with one minor correction for lifting rollator up curb.     04/25/2022: PT instructed with verbal, tactile  & HO (see pt instructions) in updated HEP. Pt return demo & pt/wife verbalized understanding.    Standing at sink with chair backs to right & left and wheelchair behind you. With both hands place on object to upper shelf. With both hands turn to place object on back of each chair. With one hand lean to side place object in bottom of each chair on your sides. Turn around so sink is ~2" behind your bottom, place object in seat of w/c. Face sink, move prosthesis across in front of right leg. And back beside right leg. Move prosthesis forward try to hold position for 2-3 seconds without touching. And back beside right leg. Move prosthesis out to side try to hold position for 2-3 seconds without touching. And back beside right leg. Move prosthesis back behind you try to hold position for 2-3  seconds without touching. And back beside right leg. Move right leg across in  front of prosthesis. try to hold position for 2-3 seconds without touching. And back beside left leg. Move right leg forward try to hold position for 2-3 seconds without touching. And back beside left leg. Move right leg to side try to hold position for 2-3 seconds without touching. And back beside left leg. Move right leg back behind you try to hold position for 2-3 seconds without touching. And back beside left leg. Walk forward & backward with cane in right hand. 1st lap touching counter on top, 2nd lap touching counter with back of hand, 3rd lap not touching counter.  Walk to right & left side stepping (make sure you put weight on prosthesis prior to stepping right leg) with cane in right hand. 1st lap touching counter on top, 2nd lap touching counter with back of hand, 3rd lap not touching counter.       PATIENT EDUCATION: PATIENT EDUCATED ON FOLLOWING PROSTHETIC CARE: Education details:   Correct ply sock adjustment, Propper donning, and Proper wear schedule/adjustment Prosthetic wear tolerance: 4 hours 2x/day, 7 days/week Person educated: Patient and Spouse Education method: Explanation, Corporate treasurer cues, and Verbal cues Education comprehension: verbalized understanding, verbal cues required, and needs further education   HOME EXERCISE PROGRAM: Standing at sink with chair backs to right & left and wheelchair behind you. With both hands place on object to upper shelf. With both hands turn to place object on back of each chair. With one hand lean to side place object in bottom of each chair on your sides. Turn around so sink is ~2" behind your bottom, place object in seat of w/c. Face sink, move prosthesis across in front of right leg. And back beside right leg. Move prosthesis forward try to hold position for 2-3 seconds without touching. And back beside right leg. Move prosthesis out to side try to hold  position for 2-3 seconds without touching. And back beside right leg. Move prosthesis back behind you try to hold position for 2-3 seconds without touching. And back beside right leg. Move right leg across in front of prosthesis. try to hold position for 2-3 seconds without touching. And back beside left leg. Move right leg forward try to hold position for 2-3 seconds without touching. And back beside left leg. Move right leg to side try to hold position for 2-3 seconds without touching. And back beside left leg. Move right leg back behind you try to hold position for 2-3 seconds without touching. And back beside left leg. Walk forward & backward with cane in right hand. 1st lap touching counter on top, 2nd lap touching counter with back of hand, 3rd lap not touching counter.  Walk to right & left side stepping (make sure you put weight on prosthesis prior to stepping right leg) with cane in right hand. 1st lap touching counter on top, 2nd lap touching counter with back of hand, 3rd lap not touching counter.   Access Code: [email or web address] URL: [email or web address] Date: 03/21/2022 Prepared by: Jamey Reas  Exercises - Modified [place] (Mirrored)  - 1-2 x daily - 7 x weekly - 1 sets - 2-3 reps - 20-30 seconds hold - wide stance head motions eyes open  - 1-2 x daily - 7 x weekly - 1 sets - 10 reps - 2 seconds hold - Feet Apart with Eyes Closed with Head Motions  - 1-2 x daily - 7 x weekly - 1 sets - 10 reps - 2 seconds hold    ASSESSMENT:  CLINICAL IMPRESSION: Pt &  wife appear to understand 1)need for max distance walk 1x/day  2)family assisted gait with cane focusing on wt shift over prosthesis  3)4-square stepping near counter.  Pt continues to benefit from skilled PT.      OBJECTIVE IMPAIRMENTS: Abnormal gait, cardiopulmonary status limiting activity, decreased activity tolerance, decreased balance, decreased endurance, decreased knowledge of use of DME, decreased mobility,  decreased ROM, decreased strength, postural dysfunction, prosthetic dependency , and pain.    ACTIVITY LIMITATIONS: standing, stairs, transfers, locomotion level, and balance   PARTICIPATION LIMITATIONS: community activity and church   PERSONAL FACTORS: Fitness, Time since onset of injury/illness/exacerbation, and 3+ comorbidities: see PMH  are also affecting patient's functional outcome.    REHAB POTENTIAL: Good   CLINICAL DECISION MAKING: Evolving/moderate complexity   EVALUATION COMPLEXITY: Moderate     GOALS: Goals reviewed with patient? Yes   SHORT TERM GOALS: Target date: 05/04/2022   Patient reports walking in home >50% of time to change locations.  Baseline: SEE OBJECTIVE DATA Goal status: MET 05/02/2022 2.  Patient amb >200' with rollator walker on level surfaces & negotiates ramps / curbs safely. Baseline: SEE OBJECTIVE DATA Goal status: MET 05/02/2022   3.  Patient demonstrates understanding of updated HEP.  Baseline: SEE OBJECTIVE DATA Goal status:  MET 05/02/2022   LONG TERM GOALS: Target date: 05/31/2022   Patient demonstrates & verbalized understanding of prosthetic care to enable safe utilization of prosthesis. Baseline: SEE OBJECTIVE DATA Goal status: Ongoing 05/09/2022   Patient tolerates daily prosthesis wear >50% of awake hours without skin or limb pain issues. Baseline: SEE OBJECTIVE DATA Goal status: Ongoing 05/09/2022   Berg Balance >/= 18/56 indicate lower fall risk Baseline: SEE OBJECTIVE DATA Goal status: Ongoing 05/09/2022   Patient ambulates >400' with prosthesis & RW modified independent Baseline: SEE OBJECTIVE DATA Goal status:  Ongoing 05/09/2022   Patient negotiates ramps, curbs with RW & stairs with single rail / cane with prosthesis modified independent. Baseline: SEE OBJECTIVE DATA Goal status: Ongoing 05/09/2022   Patient verbalizes & demonstrates understanding of ongoing HEP. Baseline: SEE OBJECTIVE DATA Goal status: Ongoing 05/09/2022    PLAN:   PT FREQUENCY: 1x/week   PT DURATION: 12 weeks   PLANNED INTERVENTIONS: Therapeutic exercises, Therapeutic activity, Neuromuscular re-education, Balance training, Gait training, Patient/Family education, Self Care, Stair training, Vestibular training, Prosthetic training, and DME instructions   PLAN FOR NEXT SESSION:  check on activities instructed 2/7.  work towards [place], balance activities    Jamey Reas, PT, DPT 05/09/2022, 9:50 AM

## 2022-05-15 ENCOUNTER — Encounter: Payer: Self-pay | Admitting: Physical Therapy

## 2022-05-15 ENCOUNTER — Ambulatory Visit (INDEPENDENT_AMBULATORY_CARE_PROVIDER_SITE_OTHER): Payer: 59 | Admitting: Physical Therapy

## 2022-05-15 ENCOUNTER — Encounter: Payer: 59 | Admitting: Physical Therapy

## 2022-05-15 DIAGNOSIS — R2681 Unsteadiness on feet: Secondary | ICD-10-CM | POA: Diagnosis not present

## 2022-05-15 DIAGNOSIS — M6281 Muscle weakness (generalized): Secondary | ICD-10-CM

## 2022-05-15 DIAGNOSIS — R293 Abnormal posture: Secondary | ICD-10-CM

## 2022-05-15 DIAGNOSIS — M25652 Stiffness of left hip, not elsewhere classified: Secondary | ICD-10-CM

## 2022-05-15 DIAGNOSIS — R2689 Other abnormalities of gait and mobility: Secondary | ICD-10-CM | POA: Diagnosis not present

## 2022-05-15 NOTE — Therapy (Signed)
OUTPATIENT PHYSICAL THERAPY TREATMENT NOTE   Patient Name: Samuel Carney MRN: [id number] DOB:04-Apr-1945, 77 y.o., male Today's Date: 05/15/2022  PCP: Juluis Pitch, MD  REFERRING PROVIDER: Jamse Arn, MD at [place]:   PT End of Session - 05/15/22 1059     Visit Number 9    Number of Visits 13    Date for PT Re-Evaluation 05/31/22    Authorization Type [employer] Medicare    Authorization Time Period 20% co-insurance    Progress Note Due on Visit 10    PT Start Time 1059    PT Stop Time 1141    PT Time Calculation (min) 42 min    Equipment Utilized During Treatment Gait belt    Activity Tolerance Patient tolerated treatment well;Patient limited by fatigue    Behavior During Therapy WFL for tasks assessed/performed                    Past Medical History:  Diagnosis Date   Alcohol abuse    Aortic atherosclerosis ([place])    B12 deficiency    Cirrhosis ([place])    COPD (chronic obstructive pulmonary disease) ([place])    Coronary artery disease    GERD (gastroesophageal reflux disease)    Glaucoma    Grade II diastolic dysfunction    History of kidney stones    HLD (hyperlipidemia)    Hx of radiation therapy    Hypertension    Lung mass    Moderate mitral regurgitation    Pneumonia    PVD (peripheral vascular disease) ([place])    Squamous cell carcinoma of lung, right ([place]) 2019   Past Surgical History:  Procedure Laterality Date   COLONOSCOPY WITH PROPOFOL     COLONOSCOPY WITH PROPOFOL N/A 11/25/2018   Procedure: COLONOSCOPY WITH PROPOFOL;  Surgeon: Lollie Sails, MD;  Location: [facility] ENDOSCOPY;  Service: Endoscopy;  Laterality: N/A;   COLONOSCOPY WITH PROPOFOL N/A 11/29/2021   Procedure: COLONOSCOPY WITH PROPOFOL;  Surgeon: Toledo, Benay Pike, MD;  Location: [facility] ENDOSCOPY;  Service: Gastroenterology;  Laterality: N/A;   ENDARTERECTOMY FEMORAL Left 07/13/2020   Procedure: ENDARTERECTOMY FEMORAL ( SFA STENT);  Surgeon: Katha Cabal,  MD;  Location: [facility] ORS;  Service: Vascular;  Laterality: Left;   ESOPHAGOGASTRODUODENOSCOPY (EGD) WITH PROPOFOL N/A 11/25/2018   Procedure: ESOPHAGOGASTRODUODENOSCOPY (EGD) WITH PROPOFOL;  Surgeon: Lollie Sails, MD;  Location: [facility] ENDOSCOPY;  Service: Endoscopy;  Laterality: N/A;   ESOPHAGOGASTRODUODENOSCOPY (EGD) WITH PROPOFOL N/A 11/29/2021   Procedure: ESOPHAGOGASTRODUODENOSCOPY (EGD) WITH PROPOFOL;  Surgeon: Toledo, Benay Pike, MD;  Location: [facility] ENDOSCOPY;  Service: Gastroenterology;  Laterality: N/A;   ESOPHAGOGASTRODUODENOSCOPY (EGD) WITH PROPOFOL N/A 12/08/2021   Procedure: ESOPHAGOGASTRODUODENOSCOPY (EGD) WITH PROPOFOL;  Surgeon: [name], MD;  Location: [facility] ENDOSCOPY;  Service: Gastroenterology;  Laterality: N/A;   LEG AMPUTATION ABOVE KNEE Left    LOWER EXTREMITY ANGIOGRAPHY Left 06/07/2020   Procedure: LOWER EXTREMITY ANGIOGRAPHY;  Surgeon: Katha Cabal, MD;  Location: [place] CV LAB;  Service: Cardiovascular;  Laterality: Left;   LOWER EXTREMITY ANGIOGRAPHY Left 01/10/2021   Procedure: LOWER EXTREMITY ANGIOGRAPHY;  Surgeon: Katha Cabal, MD;  Location: [place] CV LAB;  Service: Cardiovascular;  Laterality: Left;   Patient Active Problem List   Diagnosis Date Noted   UGIB (upper gastrointestinal bleed)    Malnutrition of moderate degree 12/06/2021   [place] 12/05/2021   COPD (chronic obstructive pulmonary disease) (H[facility])    Grade II diastolic dysfunction    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), left (H[facility])    AKI (acute kidney injury) ([place])    Atherosclerosis of artery of extremity with ulceration ([place]) 07/13/2020   Atherosclerosis of native arteries of the extremities with ulceration ([place]) 01/24/8526   Alcoholic cirrhosis of liver without ascites ([place]) 04/09/2019   Hyperlipidemia, mixed 04/09/2019   Malignant 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) 04/09/2019   Thrombocytopenia ([place]) 04/09/2019   CAD (coronary artery disease) 10/14/2017    Lung cancer ([place]) 10/14/2017   Tobacco abuse 08/14/2017   Benign essential hypertension 09/09/2013    REFERRING DIAG: P82.423 Acquired absence of left leg above knee  ONSET DATE: 02/16/2022 new prosthesis delivery    THERAPY DIAG:  Unsteadiness on feet  Muscle weakness (generalized)  Abnormal posture  Other abnormalities of gait and mobility  Stiffness of left hip, not elsewhere classified  Rationale for Evaluation and Treatment Rehabilitation  PERTINENT HISTORY: PAD, CAD, angina, malignant neoplasm of right lung, HLD, alcoholic cirrhosis of liver, HTN, COPD   PRECAUTIONS: Fall  SUBJECTIVE:                                                                                                                                                                                      SUBJECTIVE STATEMENT:  He has been doing his exercises as PT recommended including long walk.   PAIN:  Are you having pain? Yes: NPRS scale: standing 0/10 Pain location: lumbar right & left sides Pain description: sore with muscles spasm Aggravating factors: standing & walking Relieving factors: sitting  OBJECTIVE: (objective measures completed at initial evaluation unless otherwise dated)  COGNITION: Overall cognitive status: Within functional limits for tasks assessed   POSTURE: rounded shoulders, forward head, flexed trunk , and weight shift right   LOWER EXTREMITY ROM:   ROM P:passive  A:active Left eval Left 04/11/22  Hip flexion     Hip extension Standing A: -17* Standing A: -13*  Hip abduction     Hip adduction     Hip internal rotation     Hip external rotation     Knee flexion     Knee extension     Ankle dorsiflexion     Ankle plantarflexion     Ankle inversion     Ankle eversion      (Blank rows = not tested)   LOWER EXTREMITY MMT:   MMT Right eval Left eval  Hip flexion      Hip extension   4-/5  Hip abduction   4-/5  Hip adduction      Hip internal rotation       Hip external rotation   NA  Knee flexion 4/5 NA  Knee extension 4/5 NA  Ankle dorsiflexion   NA  Ankle plantarflexion   NA  Ankle inversion   NA  Ankle eversion   NA  (Blank rows = not tested)   TRANSFERS: 05/09/2022: Sit to / from stand using armrests of 18" chair and stabilizes without touching external support (close for safety).  03/07/2023:  Sit to stand: SBA requires use of UEs from chair without armrests to RW for stabilization.  Stand to sit: SBA requires use of UEs to chair without armrests from RW for stability.    GAIT: 05/02/2022: 6 Minute Walk Test with rollator walker & TFA prosthesis 415'  Resting HR 63  post HR 86  Gait velocity: 1.50 ft/sec   33ft /min  Physiological Cost Index  0.25    03/07/2023: Gait pattern: step to pattern, decreased step length- Right, decreased stance time- Left, circumduction- Left, Left hip hike, knee flexed in stance- Right, and trunk flexed Distance walked: 125' Assistive device utilized: Environmental consultant - 2 wheeled & TFA prosthesis Level of assistance: SBA Gait velocity: 1.29 ft/sec Comments: pt amb 20' with cane stand alone tip with modA.    RAMP pt neg 12* ramp with RW & TFA prosthesis with SBA   CURB: pt neg 6" curb with RW & TFA prosthesis with SBA   FUNCTIONAL TESTs:  Berg Balance Scale: 14/56   [facility] PT Assessment - 03/06/22 1100                Standardized Balance Assessment    Standardized Balance Assessment Berg Balance Test          Berg Balance Test    Sit to Stand Needs minimal aid to stand or to stabilize   needs minA to stabilize or RW support    Standing Unsupported Needs several tries to stand 30 seconds unsupported     Sitting with Back Unsupported but Feet Supported on Floor or Stool Able to sit safely and securely 2 minutes     Stand to Sit Controls descent by using hands     Transfers Able to transfer safely, definite need of hands     Standing Unsupported with Eyes Closed Needs help to keep from falling      Standing Unsupported with Feet Together Needs help to attain position and unable to hold for 15 seconds     From Standing, Reach Forward with Outstretched Arm Reaches forward but needs supervision     From Standing Position, Pick up Object from Floor Unable to try/needs assist to keep balance     From Standing Position, Turn to Look Behind Over each Shoulder Needs supervision when turning     Turn 360 Degrees Needs assistance while turning     Standing Unsupported, Alternately Place Feet on Step/Stool Needs assistance to keep from falling or unable to try     Standing Unsupported, One Foot in [employer] balance while stepping or standing     Standing on One Leg Unable to try or needs assist to prevent fall     Total Score 14                     CURRENT PROSTHETIC WEAR ASSESSMENT: Patient is independent with: skin check, residual limb care, and prosthetic cleaning Patient is dependent with: correct ply sock adjustment, proper wear schedule/adjustment, and proper weight-bearing schedule/adjustment Donning prosthesis: SBA with new suspension Doffing prosthesis: Modified independence Prosthetic wear tolerance: up to 5 hours, 1x/day, 14  of 19 days since delivery Prosthetic weight bearing tolerance: 5 minutes Edema: none Residual limb condition: pt & wife report no issues Prosthetic description: silicon liner with suction ring suspension, total contact socket with flexible inner liner, SAFETY knee, single axis foot K code/activity level with prosthetic use: Level 2       TODAY'S TREATMENT:                                                                                                                             DATE:  05/15/2022: Prosthetic Training with TFA prosthesis: Pt amb 80' & 50' with cane stand alone tip & HHA modA. Pt amb 30' with cane without HHA with minA to modA (one balance loss).  Standing at sink bw 2 chair backs: cane support moving LE forward then back to bilateral  stance, side step then back to bilateral stance & backwards then back to bilateral stance. 5 reps ea. Moving prosthesis with close supervision. Moving RLE so stance on prosthesis with touch back of hand first 3 reps, then close supervision 2 reps.  Four-square stepping CW/CCW  with cane 3 reps ea with close supervision. Pt amb with LUE hovering over counter RUE cane support with close supervision. 8' forward & backward 5 reps.     05/09/2022: Prosthetic Training with TFA prosthesis: PT reviewed walking program of short, medium & long (max tolerable distance) with encouragement for long 1x/day.  Walking report sounds like he is improving short with higher frequency & medium to access community. Pt & wife verbalized understanding.  Pt amb 75' X 2 with cane stand alone tip & HHA / modA. PT reviewed how family to assist with HHA. PT demo & verbal cues on weight shift over prosthesis with right heel rise prior to stepping and looking forward (glancing not staring at floor). Pt verbalized understanding.  4-square step with back to counter & chair backs on ea side:  stepping CW & CCW first 2 reps with BUE support on chair backs & 3 reps with cane RUE / chair back LUE.  Pt & wife verbalized understanding including set up as HEP.    05/02/2022: Prosthetic Training with TFA prosthesis: He reports walking with RW vs w/c >50% of time in home and some to go into restaurants.  PT instructed pt & wife in walking program of short (room to room) increasing frequency within day;  long (max tolerable distance) 1x/day;  medium (bw short & long - maybe to/from car or to mailbox) >4x/day. Pt & wife verbalized understanding. See objective data for gait assessment. Pt neg ramp & curb with rollator walker with one minor correction for lifting rollator up curb.       PATIENT EDUCATION: PATIENT EDUCATED ON FOLLOWING PROSTHETIC CARE: Education details:   Correct ply sock adjustment, Propper donning, and Proper wear  schedule/adjustment Prosthetic wear tolerance: 4 hours 2x/day, 7 days/week Person educated: Patient and Spouse Education method: Explanation, Tactile cues, and Verbal cues  Education comprehension: verbalized understanding, verbal cues required, and needs further education   HOME EXERCISE PROGRAM: Standing at sink with chair backs to right & left and wheelchair behind you. With both hands place on object to upper shelf. With both hands turn to place object on back of each chair. With one hand lean to side place object in bottom of each chair on your sides. Turn around so sink is ~2" behind your bottom, place object in seat of w/c. Face sink, move prosthesis across in front of right leg. And back beside right leg. Move prosthesis forward try to hold position for 2-3 seconds without touching. And back beside right leg. Move prosthesis out to side try to hold position for 2-3 seconds without touching. And back beside right leg. Move prosthesis back behind you try to hold position for 2-3 seconds without touching. And back beside right leg. Move right leg across in front of prosthesis. try to hold position for 2-3 seconds without touching. And back beside left leg. Move right leg forward try to hold position for 2-3 seconds without touching. And back beside left leg. Move right leg to side try to hold position for 2-3 seconds without touching. And back beside left leg. Move right leg back behind you try to hold position for 2-3 seconds without touching. And back beside left leg. Walk forward & backward with cane in right hand. 1st lap touching counter on top, 2nd lap touching counter with back of hand, 3rd lap not touching counter.  Walk to right & left side stepping (make sure you put weight on prosthesis prior to stepping right leg) with cane in right hand. 1st lap touching counter on top, 2nd lap touching counter with back of hand, 3rd lap not touching counter.   Access Code: [email or web address] URL:  [email or web address] Date: 03/21/2022 Prepared by: Jamey Reas  Exercises - Modified [place] (Mirrored)  - 1-2 x daily - 7 x weekly - 1 sets - 2-3 reps - 20-30 seconds hold - wide stance head motions eyes open  - 1-2 x daily - 7 x weekly - 1 sets - 10 reps - 2 seconds hold - Feet Apart with Eyes Closed with Head Motions  - 1-2 x daily - 7 x weekly - 1 sets - 10 reps - 2 seconds hold    ASSESSMENT:  CLINICAL IMPRESSION: Patient improved balance & gait with cane requiring less assistance.  Pt is improving his mobility in community with RW without use of w/c.  Pt continues to benefit from skilled PT.      OBJECTIVE IMPAIRMENTS: Abnormal gait, cardiopulmonary status limiting activity, decreased activity tolerance, decreased balance, decreased endurance, decreased knowledge of use of DME, decreased mobility, decreased ROM, decreased strength, postural dysfunction, prosthetic dependency , and pain.    ACTIVITY LIMITATIONS: standing, stairs, transfers, locomotion level, and balance   PARTICIPATION LIMITATIONS: community activity and church   PERSONAL FACTORS: Fitness, Time since onset of injury/illness/exacerbation, and 3+ comorbidities: see PMH  are also affecting patient's functional outcome.    REHAB POTENTIAL: Good   CLINICAL DECISION MAKING: Evolving/moderate complexity   EVALUATION COMPLEXITY: Moderate     GOALS: Goals reviewed with patient? Yes   SHORT TERM GOALS: Target date: 05/04/2022   Patient reports walking in home >50% of time to change locations.  Baseline: SEE OBJECTIVE DATA Goal status: MET 05/02/2022 2.  Patient amb >200' with rollator walker on level surfaces & negotiates ramps / curbs safely. Baseline: SEE OBJECTIVE DATA Goal status: MET 05/02/2022  3.  Patient demonstrates understanding of updated HEP.  Baseline: SEE OBJECTIVE DATA Goal status:  MET 05/02/2022   LONG TERM GOALS: Target date: 05/31/2022   Patient demonstrates & verbalized  understanding of prosthetic care to enable safe utilization of prosthesis. Baseline: SEE OBJECTIVE DATA Goal status: Ongoing 05/09/2022   Patient tolerates daily prosthesis wear >50% of awake hours without skin or limb pain issues. Baseline: SEE OBJECTIVE DATA Goal status: Ongoing 05/09/2022   Berg Balance >/= 18/56 indicate lower fall risk Baseline: SEE OBJECTIVE DATA Goal status: Ongoing 05/09/2022   Patient ambulates >400' with prosthesis & RW modified independent Baseline: SEE OBJECTIVE DATA Goal status:  Ongoing 05/09/2022   Patient negotiates ramps, curbs with RW & stairs with single rail / cane with prosthesis modified independent. Baseline: SEE OBJECTIVE DATA Goal status: Ongoing 05/09/2022   Patient verbalizes & demonstrates understanding of ongoing HEP. Baseline: SEE OBJECTIVE DATA Goal status: Ongoing 05/09/2022   PLAN:   PT FREQUENCY: 1x/week   PT DURATION: 12 weeks   PLANNED INTERVENTIONS: Therapeutic exercises, Therapeutic activity, Neuromuscular re-education, Balance training, Gait training, Patient/Family education, Self Care, Stair training, Vestibular training, Prosthetic training, and DME instructions   PLAN FOR NEXT SESSION: do 10th visit progress note,  work towards [place], balance activities    Jamey Reas, PT, DPT 05/15/2022, 12:48 PM

## 2022-05-23 ENCOUNTER — Ambulatory Visit (INDEPENDENT_AMBULATORY_CARE_PROVIDER_SITE_OTHER): Payer: 59 | Admitting: Physical Therapy

## 2022-05-23 ENCOUNTER — Encounter: Payer: Self-pay | Admitting: Physical Therapy

## 2022-05-23 DIAGNOSIS — R293 Abnormal posture: Secondary | ICD-10-CM

## 2022-05-23 DIAGNOSIS — R2681 Unsteadiness on feet: Secondary | ICD-10-CM

## 2022-05-23 DIAGNOSIS — M25652 Stiffness of left hip, not elsewhere classified: Secondary | ICD-10-CM

## 2022-05-23 DIAGNOSIS — R2689 Other abnormalities of gait and mobility: Secondary | ICD-10-CM | POA: Diagnosis not present

## 2022-05-23 DIAGNOSIS — M6281 Muscle weakness (generalized): Secondary | ICD-10-CM | POA: Diagnosis not present

## 2022-05-23 NOTE — Therapy (Deleted)
OUTPATIENT PHYSICAL THERAPY TREATMENT NOTE   Patient Name: Samuel Carney MRN: [id number] DOB:May 28, 1945, 77 y.o., male Today's Date: 05/23/2022  PCP: Juluis Pitch, MD  REFERRING PROVIDER: Jamse Arn, MD at [place]:   PT End of Session - 05/23/22 0850     Visit Number 10    Number of Visits 13    Date for PT Re-Evaluation 05/31/22    Authorization Type [employer] Medicare    Authorization Time Period 20% co-insurance    Progress Note Due on Visit 10    PT Start Time 0850    Equipment Utilized During Treatment Gait belt    Activity Tolerance Patient tolerated treatment well;Patient limited by fatigue    Behavior During Therapy WFL for tasks assessed/performed                     Past Medical History:  Diagnosis Date   Alcohol abuse    Aortic atherosclerosis ([place])    B12 deficiency    Cirrhosis (HCC)    COPD (chronic obstructive pulmonary disease) ([place])    Coronary artery disease    GERD (gastroesophageal reflux disease)    Glaucoma    Grade II diastolic dysfunction    History of kidney stones    HLD (hyperlipidemia)    Hx of radiation therapy    Hypertension    Lung mass    Moderate mitral regurgitation    Pneumonia    PVD (peripheral vascular disease) ([place])    Squamous cell carcinoma of lung, right ([place]) 2019   Past Surgical History:  Procedure Laterality Date   COLONOSCOPY WITH PROPOFOL     COLONOSCOPY WITH PROPOFOL N/A 11/25/2018   Procedure: COLONOSCOPY WITH PROPOFOL;  Surgeon: Lollie Sails, MD;  Location: [facility] ENDOSCOPY;  Service: Endoscopy;  Laterality: N/A;   COLONOSCOPY WITH PROPOFOL N/A 11/29/2021   Procedure: COLONOSCOPY WITH PROPOFOL;  Surgeon: Toledo, Benay Pike, MD;  Location: [facility] ENDOSCOPY;  Service: Gastroenterology;  Laterality: N/A;   ENDARTERECTOMY FEMORAL Left 07/13/2020   Procedure: ENDARTERECTOMY FEMORAL ( SFA STENT);  Surgeon: Katha Cabal, MD;  Location: [facility] ORS;  Service: Vascular;   Laterality: Left;   ESOPHAGOGASTRODUODENOSCOPY (EGD) WITH PROPOFOL N/A 11/25/2018   Procedure: ESOPHAGOGASTRODUODENOSCOPY (EGD) WITH PROPOFOL;  Surgeon: Lollie Sails, MD;  Location: [facility] ENDOSCOPY;  Service: Endoscopy;  Laterality: N/A;   ESOPHAGOGASTRODUODENOSCOPY (EGD) WITH PROPOFOL N/A 11/29/2021   Procedure: ESOPHAGOGASTRODUODENOSCOPY (EGD) WITH PROPOFOL;  Surgeon: Toledo, Benay Pike, MD;  Location: [facility] ENDOSCOPY;  Service: Gastroenterology;  Laterality: N/A;   ESOPHAGOGASTRODUODENOSCOPY (EGD) WITH PROPOFOL N/A 12/08/2021   Procedure: ESOPHAGOGASTRODUODENOSCOPY (EGD) WITH PROPOFOL;  Surgeon: [name], MD;  Location: [facility] ENDOSCOPY;  Service: Gastroenterology;  Laterality: N/A;   LEG AMPUTATION ABOVE KNEE Left    LOWER EXTREMITY ANGIOGRAPHY Left 06/07/2020   Procedure: LOWER EXTREMITY ANGIOGRAPHY;  Surgeon: Katha Cabal, MD;  Location: [place] CV LAB;  Service: Cardiovascular;  Laterality: Left;   LOWER EXTREMITY ANGIOGRAPHY Left 01/10/2021   Procedure: LOWER EXTREMITY ANGIOGRAPHY;  Surgeon: Katha Cabal, MD;  Location: [place] [facility] LAB;  Service: Cardiovascular;  Laterality: Left;   Patient Active Problem List   Diagnosis Date Noted   UGIB (upper gastrointestinal bleed)    Malnutrition of moderate degree 12/06/2021   Melena 12/05/2021   COPD (chronic obstructive pulmonary disease) (HCC)    Grade II diastolic dysfunction    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), left (HCC)    AKI (acute kidney  injury) [facility])    Atherosclerosis of artery of extremity with ulceration ([place]) 07/13/2020   Atherosclerosis of native arteries of the extremities with ulceration ([place]) 38/18/2993   Alcoholic cirrhosis of liver without ascites ([place]) 04/09/2019   Hyperlipidemia, mixed 04/09/2019   Malignant 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 ([place]) 04/09/2019   Thrombocytopenia ([place]) 04/09/2019   CAD (coronary artery disease) 10/14/2017   Lung cancer ([place]) 10/14/2017   Tobacco  abuse 08/14/2017   Benign essential hypertension 09/09/2013    REFERRING DIAG: Z16.967 Acquired absence of left leg above knee  ONSET DATE: 02/16/2022 new prosthesis delivery    THERAPY DIAG:  Unsteadiness on feet  Muscle weakness (generalized)  Abnormal posture  Other abnormalities of gait and mobility  Stiffness of left hip, not elsewhere classified  Rationale for Evaluation and Treatment Rehabilitation  PERTINENT HISTORY: PAD, CAD, angina, malignant neoplasm of right lung, HLD, alcoholic cirrhosis of liver, HTN, COPD   PRECAUTIONS: Fall  SUBJECTIVE:                                                                                                                                                                                      SUBJECTIVE STATEMENT:  He has been walking with cane in house with no falls or near falls.     PAIN:  Are you having pain? Yes: NPRS scale: standing 0/10 Pain location: lumbar right & left sides Pain description: sore with muscles spasm Aggravating factors: standing & walking Relieving factors: sitting  OBJECTIVE: (objective measures completed at initial evaluation unless otherwise dated)  COGNITION: Overall cognitive status: Within functional limits for tasks assessed   POSTURE: rounded shoulders, forward head, flexed trunk , and weight shift right   LOWER EXTREMITY ROM:   ROM P:passive  A:active Left eval Left 04/11/22  Hip flexion     Hip extension Standing A: -17* Standing A: -13*  Hip abduction     Hip adduction     Hip internal rotation     Hip external rotation     Knee flexion     Knee extension     Ankle dorsiflexion     Ankle plantarflexion     Ankle inversion     Ankle eversion      (Blank rows = not tested)   LOWER EXTREMITY MMT:   MMT Right eval Left eval  Hip flexion      Hip extension   4-/5  Hip abduction   4-/5  Hip adduction      Hip internal rotation      Hip external rotation   NA  Knee flexion  4/5 NA  Knee extension 4/5 NA  Ankle dorsiflexion   NA  Ankle plantarflexion   NA  Ankle inversion   NA  Ankle eversion   NA  (Blank rows = not tested)   TRANSFERS: 05/09/2022: Sit to / from stand using armrests of 18" chair and stabilizes without touching external support (close for safety).  03/07/2023:  Sit to stand: SBA requires use of UEs from chair without armrests to RW for stabilization.  Stand to sit: SBA requires use of UEs to chair without armrests from RW for stability.    GAIT: 05/02/2022: 6 Minute Walk Test with rollator walker & TFA prosthesis 415'  Resting HR 63  post HR 86  Gait velocity: 1.50 ft/sec   39ft /min  Physiological Cost Index  0.25    03/07/2023: Gait pattern: step to pattern, decreased step length- Right, decreased stance time- Left, circumduction- Left, Left hip hike, knee flexed in stance- Right, and trunk flexed Distance walked: 125' Assistive device utilized: Environmental consultant - 2 wheeled & TFA prosthesis Level of assistance: SBA Gait velocity: 1.29 ft/sec Comments: pt amb 20' with cane stand alone tip with modA.    RAMP  03/07/2023: pt neg 12* ramp with RW & TFA prosthesis with SBA   CURB:  03/07/2023: pt neg 6" curb with RW & TFA prosthesis with SBA   FUNCTIONAL TESTs:  03/07/2023: Merrilee Jansky Balance Scale: 14/56    CURRENT PROSTHETIC WEAR ASSESSMENT: 03/07/2023:  Patient is independent with: skin check, residual limb care, and prosthetic cleaning Patient is dependent with: correct ply sock adjustment, proper wear schedule/adjustment, and proper weight-bearing schedule/adjustment Donning prosthesis: SBA with new suspension Doffing prosthesis: Modified independence Prosthetic wear tolerance: up to 5 hours, 1x/day, 14 of 19 days since delivery Prosthetic weight bearing tolerance: 5 minutes Edema: none Residual limb condition: pt & wife report no issues Prosthetic description: silicon liner with suction ring suspension, total contact socket with flexible  inner liner, SAFETY knee, single axis foot K code/activity level with prosthetic use: Level 2       TODAY'S TREATMENT:                                                                                                                             DATE:  05/22/2022: *** Prosthetic Training with TFA prosthesis:   05/15/2022: Prosthetic Training with TFA prosthesis: Pt amb 80' & 50' with cane stand alone tip & HHA modA. Pt amb 30' with cane without HHA with minA to modA (one balance loss).  Standing at sink bw 2 chair backs: cane support moving LE forward then back to bilateral stance, side step then back to bilateral stance & backwards then back to bilateral stance. 5 reps ea. Moving prosthesis with close supervision. Moving RLE so stance on prosthesis with touch back of hand first 3 reps, then close supervision 2 reps.  Four-square stepping CW/CCW  with cane 3 reps ea with close supervision. Pt amb with LUE hovering over counter RUE cane support with close supervision. 8'  forward & backward 5 reps.     05/09/2022: Prosthetic Training with TFA prosthesis: PT reviewed walking program of short, medium & long (max tolerable distance) with encouragement for long 1x/day.  Walking report sounds like he is improving short with higher frequency & medium to access community. Pt & wife verbalized understanding.  Pt amb 75' X 2 with cane stand alone tip & HHA / modA. PT reviewed how family to assist with HHA. PT demo & verbal cues on weight shift over prosthesis with right heel rise prior to stepping and looking forward (glancing not staring at floor). Pt verbalized understanding.  4-square step with back to counter & chair backs on ea side:  stepping CW & CCW first 2 reps with BUE support on chair backs & 3 reps with cane RUE / chair back LUE.  Pt & wife verbalized understanding including set up as HEP.     HOME EXERCISE PROGRAM: Standing at sink with chair backs to right & left and wheelchair behind  you. With both hands place on object to upper shelf. With both hands turn to place object on back of each chair. With one hand lean to side place object in bottom of each chair on your sides. Turn around so sink is ~2" behind your bottom, place object in seat of w/c. Face sink, move prosthesis across in front of right leg. And back beside right leg. Move prosthesis forward try to hold position for 2-3 seconds without touching. And back beside right leg. Move prosthesis out to side try to hold position for 2-3 seconds without touching. And back beside right leg. Move prosthesis back behind you try to hold position for 2-3 seconds without touching. And back beside right leg. Move right leg across in front of prosthesis. try to hold position for 2-3 seconds without touching. And back beside left leg. Move right leg forward try to hold position for 2-3 seconds without touching. And back beside left leg. Move right leg to side try to hold position for 2-3 seconds without touching. And back beside left leg. Move right leg back behind you try to hold position for 2-3 seconds without touching. And back beside left leg. Walk forward & backward with cane in right hand. 1st lap touching counter on top, 2nd lap touching counter with back of hand, 3rd lap not touching counter.  Walk to right & left side stepping (make sure you put weight on prosthesis prior to stepping right leg) with cane in right hand. 1st lap touching counter on top, 2nd lap touching counter with back of hand, 3rd lap not touching counter.   Access Code: [email or web address] URL: [email or web address] Date: 03/21/2022 Prepared by: Jamey Reas  Exercises - Modified [place] (Mirrored)  - 1-2 x daily - 7 x weekly - 1 sets - 2-3 reps - 20-30 seconds hold - wide stance head motions eyes open  - 1-2 x daily - 7 x weekly - 1 sets - 10 reps - 2 seconds hold - Feet Apart with Eyes Closed with Head Motions  - 1-2 x daily - 7 x weekly  - 1 sets - 10 reps - 2 seconds hold    ASSESSMENT:  CLINICAL IMPRESSION: *** Patient improved balance & gait with cane requiring less assistance.  Pt is improving his mobility in community with RW without use of w/c.  Pt continues to benefit from skilled PT.      OBJECTIVE IMPAIRMENTS: Abnormal gait, cardiopulmonary status limiting activity, decreased activity tolerance, decreased balance,  decreased endurance, decreased knowledge of use of DME, decreased mobility, decreased ROM, decreased strength, postural dysfunction, prosthetic dependency , and pain.    ACTIVITY LIMITATIONS: standing, stairs, transfers, locomotion level, and balance   PARTICIPATION LIMITATIONS: community activity and church   PERSONAL FACTORS: Fitness, Time since onset of injury/illness/exacerbation, and 3+ comorbidities: see PMH  are also affecting patient's functional outcome.    REHAB POTENTIAL: Good   CLINICAL DECISION MAKING: Evolving/moderate complexity   EVALUATION COMPLEXITY: Moderate     GOALS: Goals reviewed with patient? Yes   SHORT TERM GOALS: Target date: 05/04/2022   Patient reports walking in home >50% of time to change locations.  Baseline: SEE OBJECTIVE DATA Goal status: MET 05/02/2022 2.  Patient amb >200' with rollator walker on level surfaces & negotiates ramps / curbs safely. Baseline: SEE OBJECTIVE DATA Goal status: MET 05/02/2022   3.  Patient demonstrates understanding of updated HEP.  Baseline: SEE OBJECTIVE DATA Goal status:  MET 05/02/2022   LONG TERM GOALS: Target date: 05/31/2022   Patient demonstrates & verbalized understanding of prosthetic care to enable safe utilization of prosthesis. Baseline: SEE OBJECTIVE DATA Goal status: Ongoing 05/09/2022   Patient tolerates daily prosthesis wear >50% of awake hours without skin or limb pain issues. Baseline: SEE OBJECTIVE DATA Goal status: Ongoing 05/09/2022   Berg Balance >/= 18/56 indicate lower fall risk Baseline: SEE OBJECTIVE  DATA Goal status: Ongoing 05/09/2022   Patient ambulates >400' with prosthesis & RW modified independent Baseline: SEE OBJECTIVE DATA Goal status:  Ongoing 05/09/2022   Patient negotiates ramps, curbs with RW & stairs with single rail / cane with prosthesis modified independent. Baseline: SEE OBJECTIVE DATA Goal status: Ongoing 05/09/2022   Patient verbalizes & demonstrates understanding of ongoing HEP. Baseline: SEE OBJECTIVE DATA Goal status: Ongoing 05/09/2022   PLAN:   PT FREQUENCY: 1x/week   PT DURATION: 12 weeks   PLANNED INTERVENTIONS: Therapeutic exercises, Therapeutic activity, Neuromuscular re-education, Balance training, Gait training, Patient/Family education, Self Care, Stair training, Vestibular training, Prosthetic training, and DME instructions   PLAN FOR NEXT SESSION: ***  do 10th visit progress note,  work towards [place], balance activities    Jamey Reas, PT, DPT 05/23/2022, 8:50 AM

## 2022-05-23 NOTE — Therapy (Signed)
OUTPATIENT PHYSICAL THERAPY TREATMENT NOTE & PROGRESS NOTE   Patient Name: Samuel Carney MRN: [id number] DOB:May 20, 1945, 77 y.o., male Today's Date: 05/23/2022  PCP: Juluis Pitch, MD  REFERRING PROVIDER: Jamse Arn, MD at [facility]   Progress Note Reporting Period 03/06/2022 to 05/23/2022  See note below for Objective Data and Assessment of Progress/Goals.      END OF SESSION:   PT End of Session - 05/23/22 0850     Visit Number 10    Number of Visits 13    Date for PT Re-Evaluation 05/31/22    Authorization Type [employer] Medicare    Authorization Time Period 20% co-insurance    Progress Note Due on Visit 10    PT Start Time 0847    PT Stop Time 0928    PT Time Calculation (min) 41 min    Equipment Utilized During Treatment Gait belt    Activity Tolerance Patient tolerated treatment well;Patient limited by fatigue    Behavior During Therapy WFL for tasks assessed/performed                     Past Medical History:  Diagnosis Date   Alcohol abuse    Aortic atherosclerosis (HCC)    B12 deficiency    Cirrhosis (HCC)    COPD (chronic obstructive pulmonary disease) (HCC)    Coronary artery disease    GERD (gastroesophageal reflux disease)    Glaucoma    Grade II diastolic dysfunction    History of kidney stones    HLD (hyperlipidemia)    Hx of radiation therapy    Hypertension    Lung mass    Moderate mitral regurgitation    Pneumonia    PVD (peripheral vascular disease) (HCC)    Squamous cell carcinoma of lung, right ([place]) 2019   Past Surgical History:  Procedure Laterality Date   COLONOSCOPY WITH PROPOFOL     COLONOSCOPY WITH PROPOFOL N/A 11/25/2018   Procedure: COLONOSCOPY WITH PROPOFOL;  Surgeon: Lollie Sails, MD;  Location: [facility] ENDOSCOPY;  Service: Endoscopy;  Laterality: N/A;   COLONOSCOPY WITH PROPOFOL N/A 11/29/2021   Procedure: COLONOSCOPY WITH PROPOFOL;  Surgeon: Toledo, Benay Pike, MD;  Location: [facility] ENDOSCOPY;  Service:  Gastroenterology;  Laterality: N/A;   ENDARTERECTOMY FEMORAL Left 07/13/2020   Procedure: ENDARTERECTOMY FEMORAL ( SFA STENT);  Surgeon: Katha Cabal, MD;  Location: [facility] ORS;  Service: Vascular;  Laterality: Left;   ESOPHAGOGASTRODUODENOSCOPY (EGD) WITH PROPOFOL N/A 11/25/2018   Procedure: ESOPHAGOGASTRODUODENOSCOPY (EGD) WITH PROPOFOL;  Surgeon: Lollie Sails, MD;  Location: [facility] ENDOSCOPY;  Service: Endoscopy;  Laterality: N/A;   ESOPHAGOGASTRODUODENOSCOPY (EGD) WITH PROPOFOL N/A 11/29/2021   Procedure: ESOPHAGOGASTRODUODENOSCOPY (EGD) WITH PROPOFOL;  Surgeon: Toledo, Benay Pike, MD;  Location: [facility] ENDOSCOPY;  Service: Gastroenterology;  Laterality: N/A;   ESOPHAGOGASTRODUODENOSCOPY (EGD) WITH PROPOFOL N/A 12/08/2021   Procedure: ESOPHAGOGASTRODUODENOSCOPY (EGD) WITH PROPOFOL;  Surgeon: [name], MD;  Location: [facility] ENDOSCOPY;  Service: Gastroenterology;  Laterality: N/A;   LEG AMPUTATION ABOVE KNEE Left    LOWER EXTREMITY ANGIOGRAPHY Left 06/07/2020   Procedure: LOWER EXTREMITY ANGIOGRAPHY;  Surgeon: Katha Cabal, MD;  Location: [facility];  Service: Cardiovascular;  Laterality: Left;   LOWER EXTREMITY ANGIOGRAPHY Left 01/10/2021   Procedure: LOWER EXTREMITY ANGIOGRAPHY;  Surgeon: Katha Cabal, MD;  Location: [place] [facility];  Service: Cardiovascular;  Laterality: Left;   Patient Active Problem List   Diagnosis Date Noted   UGIB (upper gastrointestinal bleed)    Malnutrition  of moderate degree 12/06/2021   Melena 12/05/2021   COPD (chronic obstructive pulmonary disease) (HCC)    Grade II diastolic dysfunction    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), left (HCC)    AKI (acute kidney injury) ([place])    Atherosclerosis of artery of extremity with ulceration ([place]) 07/13/2020   Atherosclerosis of native arteries of the extremities with ulceration ([place]) 76/28/3151   Alcoholic cirrhosis of liver without ascites ([place]) 04/09/2019   Hyperlipidemia, mixed  04/09/2019   Malignant 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) 04/09/2019   Thrombocytopenia ([place]) 04/09/2019   CAD (coronary artery disease) 10/14/2017   Lung cancer ([place]) 10/14/2017   Tobacco abuse 08/14/2017   Benign essential hypertension 09/09/2013    REFERRING DIAG: V61.607 Acquired absence of left leg above knee  ONSET DATE: 02/16/2022 new prosthesis delivery    THERAPY DIAG:  Unsteadiness on feet  Muscle weakness (generalized)  Abnormal posture  Other abnormalities of gait and mobility  Stiffness of left hip, not elsewhere classified  Rationale for Evaluation and Treatment Rehabilitation  PERTINENT HISTORY: PAD, CAD, angina, malignant neoplasm of right lung, HLD, alcoholic cirrhosis of liver, HTN, COPD   PRECAUTIONS: Fall  SUBJECTIVE:                                                                                                                                                                                      SUBJECTIVE STATEMENT:  He has been walking with cane in house with no falls or near falls.     PAIN:  Are you having pain? Yes: NPRS scale: standing 0/10 Pain location: lumbar right & left sides Pain description: sore with muscles spasm Aggravating factors: standing & walking Relieving factors: sitting  OBJECTIVE: (objective measures completed at initial evaluation unless otherwise dated)  COGNITION: Overall cognitive status: Within functional limits for tasks assessed   POSTURE: rounded shoulders, forward head, flexed trunk , and weight shift right   LOWER EXTREMITY ROM:   ROM P:passive  A:active Left eval Left 04/11/22  Hip flexion     Hip extension Standing A: -17* Standing A: -13*  Hip abduction     Hip adduction     Hip internal rotation     Hip external rotation     Knee flexion     Knee extension     Ankle dorsiflexion     Ankle plantarflexion     Ankle inversion     Ankle eversion      (Blank rows = not tested)   LOWER  EXTREMITY MMT:   MMT Right eval Left eval  Hip flexion      Hip extension  4-/5  Hip abduction   4-/5  Hip adduction      Hip internal rotation      Hip external rotation   NA  Knee flexion 4/5 NA  Knee extension 4/5 NA  Ankle dorsiflexion   NA  Ankle plantarflexion   NA  Ankle inversion   NA  Ankle eversion   NA  (Blank rows = not tested)   TRANSFERS: 05/09/2022: Sit to / from stand using armrests of 18" chair and stabilizes without touching external support (close for safety).  03/07/2023:  Sit to stand: SBA requires use of UEs from chair without armrests to RW for stabilization.  Stand to sit: SBA requires use of UEs to chair without armrests from RW for stability.    GAIT: 05/02/2022: 6 Minute Walk Test with rollator walker & TFA prosthesis 415'  Resting HR 63  post HR 86  Gait velocity: 1.50 ft/sec   66ft /min  Physiological Cost Index  0.25    03/07/2023: Gait pattern: step to pattern, decreased step length- Right, decreased stance time- Left, circumduction- Left, Left hip hike, knee flexed in stance- Right, and trunk flexed Distance walked: 125' Assistive device utilized: Environmental consultant - 2 wheeled & TFA prosthesis Level of assistance: SBA Gait velocity: 1.29 ft/sec Comments: pt amb 20' with cane stand alone tip with modA.    RAMP  03/07/2023: pt neg 12* ramp with RW & TFA prosthesis with SBA   CURB:  03/07/2023: pt neg 6" curb with RW & TFA prosthesis with SBA   FUNCTIONAL TESTs:  05/23/2022: Merrilee Jansky Balance Scale: 28/56  [facility] PT Assessment - 05/23/22 0855       Standardized Balance Assessment   Standardized Balance Assessment Berg Balance Test;Timed Up and Go Test      Berg Balance Test   Sit to Stand Able to stand  independently using hands   needs minA to stabilize or RW support   Standing Unsupported Able to stand safely 2 minutes    Sitting with Back Unsupported but Feet Supported on Floor or Stool Able to sit safely and securely 2 minutes    Stand to Sit  Controls descent by using hands    Transfers Able to transfer safely, definite need of hands    Standing Unsupported with Eyes Closed Able to stand 10 seconds with supervision    Standing Unsupported with Feet Together Able to place feet together independently and stand for 1 minute with supervision    From Standing, Reach Forward with Outstretched Arm Can reach forward >5 cm safely (2")    From Standing Position, Pick up Object from Floor Unable to try/needs assist to keep balance    From Standing Position, Turn to Look Behind Over each Shoulder Turn sideways only but maintains balance    Turn 360 Degrees Needs assistance while turning    Standing Unsupported, Alternately Place Feet on Step/Stool Needs assistance to keep from falling or unable to try    Standing Unsupported, One Foot in Front Needs help to step but can hold 15 seconds    Standing on One Leg Unable to try or needs assist to prevent fall    Total Score 28      Timed Up and Go Test   Normal TUG (seconds) 78.1   cane & TFA prosthesis with minA             03/07/2023: Berg Balance Scale: 14/56    CURRENT PROSTHETIC WEAR ASSESSMENT: 03/07/2023:  Patient is independent with: skin  check, residual limb care, and prosthetic cleaning Patient is dependent with: correct ply sock adjustment, proper wear schedule/adjustment, and proper weight-bearing schedule/adjustment Donning prosthesis: SBA with new suspension Doffing prosthesis: Modified independence Prosthetic wear tolerance: up to 5 hours, 1x/day, 14 of 19 days since delivery Prosthetic weight bearing tolerance: 5 minutes Edema: none Residual limb condition: pt & wife report no issues Prosthetic description: silicon liner with suction ring suspension, total contact socket with flexible inner liner, SAFETY knee, single axis foot K code/activity level with prosthetic use: Level 2       TODAY'S TREATMENT:                                                                                                                              DATE:  05/22/2022: Prosthetic Training with TFA prosthesis: PT reviewed recommendation to wear prosthesis daily from arising to bedtime except bathing.  This enables function / mobility throughout his day as prosthesis is available when he wants to move. Pt verbalized understanding.  Prosthesis was rotated on his limb causing a medial whip which changes timing for gait prosthetic knee flexion & extension and threw off his balance.  PT instructed in difference between rotator below socket that allow prosthetic knee & foot to be positioned in sitting without rotating socket  versus socket rotated on limb.  When socket is rotated on limb, he has to break bony lock created by socket design by sliding socket distally first, then turning socket on limb before seating limb into socket.  Pt verbalized understanding.  Pt amb 20' X 3 with min to modA with cane stand alone tip. Pt amb 40' to counter, 10' laps X 8, then 26' to w/c with cane with min to modA.  PT reviewed components of fitness program to include flexibility, strength, balance & endurance.  He appears to be addressing first 3 areas but is missing the endurance.  PT recommending 1x/day >/= 5 days / wk to walk max tolerable distance which for him is probably RLE vascular issues.  Pt verbalized understanding.   Neuromuscular Re-education: See Merrilee Jansky Balance in objective data. Worked on sit to/from stand using UEs on w/c but not touching external support to stabilize.    05/15/2022: Prosthetic Training with TFA prosthesis: Pt amb 80' & 80' with cane stand alone tip & HHA modA. Pt amb 30' with cane without HHA with minA to modA (one balance loss).  Standing at sink bw 2 chair backs: cane support moving LE forward then back to bilateral stance, side step then back to bilateral stance & backwards then back to bilateral stance. 5 reps ea. Moving prosthesis with close supervision. Moving RLE so stance on  prosthesis with touch back of hand first 3 reps, then close supervision 2 reps.  Four-square stepping CW/CCW  with cane 3 reps ea with close supervision. Pt amb with LUE hovering over counter RUE cane support with close supervision. 8' forward & backward 5 reps.  05/09/2022: Prosthetic Training with TFA prosthesis: PT reviewed walking program of short, medium & long (max tolerable distance) with encouragement for long 1x/day.  Walking report sounds like he is improving short with higher frequency & medium to access community. Pt & wife verbalized understanding.  Pt amb 75' X 2 with cane stand alone tip & HHA / modA. PT reviewed how family to assist with HHA. PT demo & verbal cues on weight shift over prosthesis with right heel rise prior to stepping and looking forward (glancing not staring at floor). Pt verbalized understanding.  4-square step with back to counter & chair backs on ea side:  stepping CW & CCW first 2 reps with BUE support on chair backs & 3 reps with cane RUE / chair back LUE.  Pt & wife verbalized understanding including set up as HEP.     HOME EXERCISE PROGRAM: Standing at sink with chair backs to right & left and wheelchair behind you. With both hands place on object to upper shelf. With both hands turn to place object on back of each chair. With one hand lean to side place object in bottom of each chair on your sides. Turn around so sink is ~2" behind your bottom, place object in seat of w/c. Face sink, move prosthesis across in front of right leg. And back beside right leg. Move prosthesis forward try to hold position for 2-3 seconds without touching. And back beside right leg. Move prosthesis out to side try to hold position for 2-3 seconds without touching. And back beside right leg. Move prosthesis back behind you try to hold position for 2-3 seconds without touching. And back beside right leg. Move right leg across in front of prosthesis. try to hold position for  2-3 seconds without touching. And back beside left leg. Move right leg forward try to hold position for 2-3 seconds without touching. And back beside left leg. Move right leg to side try to hold position for 2-3 seconds without touching. And back beside left leg. Move right leg back behind you try to hold position for 2-3 seconds without touching. And back beside left leg. Walk forward & backward with cane in right hand. 1st lap touching counter on top, 2nd lap touching counter with back of hand, 3rd lap not touching counter.  Walk to right & left side stepping (make sure you put weight on prosthesis prior to stepping right leg) with cane in right hand. 1st lap touching counter on top, 2nd lap touching counter with back of hand, 3rd lap not touching counter.   Access Code: [email or web address] URL: [email or web address] Date: 03/21/2022 Prepared by: Jamey Reas  Exercises - Modified [place] (Mirrored)  - 1-2 x daily - 7 x weekly - 1 sets - 2-3 reps - 20-30 seconds hold - wide stance head motions eyes open  - 1-2 x daily - 7 x weekly - 1 sets - 10 reps - 2 seconds hold - Feet Apart with Eyes Closed with Head Motions  - 1-2 x daily - 7 x weekly - 1 sets - 10 reps - 2 seconds hold    ASSESSMENT:  CLINICAL IMPRESSION: Pt. has attended 10 visits since last progress note.  See objective data for updated information.  Pt. has made gains towards improving his mobility with prosthesis for community accessibility with less use of w/c.  His Berg Balance test improved to 28/56 from 14/56.  Minimal detectable change is 8 points which he achieved. However <45/56 still indicates fall risk.  PT anticipates discharge next week.     OBJECTIVE IMPAIRMENTS: Abnormal gait, cardiopulmonary status limiting activity, decreased activity tolerance, decreased balance, decreased endurance, decreased knowledge of use of DME, decreased mobility, decreased ROM, decreased strength, postural dysfunction, prosthetic  dependency , and pain.    ACTIVITY LIMITATIONS: standing, stairs, transfers, locomotion level, and balance   PARTICIPATION LIMITATIONS: community activity and church   PERSONAL FACTORS: Fitness, Time since onset of injury/illness/exacerbation, and 3+ comorbidities: see PMH  are also affecting patient's functional outcome.    REHAB POTENTIAL: Good   CLINICAL DECISION MAKING: Evolving/moderate complexity   EVALUATION COMPLEXITY: Moderate     GOALS: Goals reviewed with patient? Yes   SHORT TERM GOALS: Target date: 05/04/2022   Patient reports walking in home >50% of time to change locations.  Baseline: SEE OBJECTIVE DATA Goal status: MET 05/02/2022 2.  Patient amb >200' with rollator walker on level surfaces & negotiates ramps / curbs safely. Baseline: SEE OBJECTIVE DATA Goal status: MET 05/02/2022   3.  Patient demonstrates understanding of updated HEP.  Baseline: SEE OBJECTIVE DATA Goal status:  MET 05/02/2022   LONG TERM GOALS: Target date: 05/31/2022   Patient demonstrates & verbalized understanding of prosthetic care to enable safe utilization of prosthesis. Baseline: SEE OBJECTIVE DATA Goal status: Ongoing 05/09/2022   Patient tolerates daily prosthesis wear >50% of awake hours without skin or limb pain issues. Baseline: SEE OBJECTIVE DATA Goal status: Ongoing 05/09/2022   Berg Balance >/= 18/56 indicate lower fall risk Baseline: SEE OBJECTIVE DATA Goal status: Ongoing 05/09/2022   Patient ambulates >400' with prosthesis & RW modified independent Baseline: SEE OBJECTIVE DATA Goal status:  Ongoing 05/09/2022   Patient negotiates ramps, curbs with RW & stairs with single rail / cane with prosthesis modified independent. Baseline: SEE OBJECTIVE DATA Goal status: Ongoing 05/09/2022   Patient verbalizes & demonstrates understanding of ongoing HEP. Baseline: SEE OBJECTIVE DATA Goal status: Ongoing 05/09/2022   PLAN:   PT FREQUENCY: 1x/week   PT DURATION: 12 weeks   PLANNED  INTERVENTIONS: Therapeutic exercises, Therapeutic activity, Neuromuscular re-education, Balance training, Gait training, Patient/Family education, Self Care, Stair training, Vestibular training, Prosthetic training, and DME instructions   PLAN FOR NEXT SESSION: check LTGs with probable discharge.     Jamey Reas, PT, DPT 05/23/2022, 9:50 AM

## 2022-05-29 ENCOUNTER — Encounter: Payer: Self-pay | Admitting: Physical Therapy

## 2022-05-29 ENCOUNTER — Ambulatory Visit (INDEPENDENT_AMBULATORY_CARE_PROVIDER_SITE_OTHER): Payer: 59 | Admitting: Physical Therapy

## 2022-05-29 DIAGNOSIS — R2689 Other abnormalities of gait and mobility: Secondary | ICD-10-CM

## 2022-05-29 DIAGNOSIS — M6281 Muscle weakness (generalized): Secondary | ICD-10-CM

## 2022-05-29 DIAGNOSIS — R293 Abnormal posture: Secondary | ICD-10-CM | POA: Diagnosis not present

## 2022-05-29 DIAGNOSIS — M25652 Stiffness of left hip, not elsewhere classified: Secondary | ICD-10-CM

## 2022-05-29 DIAGNOSIS — R2681 Unsteadiness on feet: Secondary | ICD-10-CM

## 2022-05-29 NOTE — Therapy (Signed)
OUTPATIENT PHYSICAL THERAPY TREATMENT NOTE & DISCHARGE SUMMARY   Patient Name: Samuel Carney MRN: [id number] DOB:1945/09/08, 77 y.o., male Today's Date: 05/29/2022  PCP: Juluis Pitch, MD  REFERRING PROVIDER: Jamse Arn, MD at [place]  Visits from Start of Care: 11  Current functional level related to goals / functional outcomes: See Below   Remaining deficits: See Below   Education / Equipment: Patient was educated in ongoing HEP including exercises / activities for flexibility, strength, balance & endurance.    Patient agrees to discharge. Patient goals were met. Patient is being discharged due to meeting the stated rehab goals.   END OF SESSION:   PT End of Session - 05/29/22 0847     Visit Number 11    Number of Visits 13    Date for PT Re-Evaluation 05/31/22    Authorization Type [employer] Medicare    Authorization Time Period 20% co-insurance    PT Start Time 0847    PT Stop Time 0925    PT Time Calculation (min) 38 min    Equipment Utilized During Treatment Gait belt    Activity Tolerance Patient tolerated treatment well;Patient limited by fatigue    Behavior During Therapy WFL for tasks assessed/performed                      Past Medical History:  Diagnosis Date   Alcohol abuse    Aortic atherosclerosis ([place])    B12 deficiency    Cirrhosis (HCC)    COPD (chronic obstructive pulmonary disease) (HCC)    Coronary artery disease    GERD (gastroesophageal reflux disease)    Glaucoma    Grade II diastolic dysfunction    History of kidney stones    HLD (hyperlipidemia)    Hx of radiation therapy    Hypertension    Lung mass    Moderate mitral regurgitation    Pneumonia    PVD (peripheral vascular disease) (HCC)    Squamous cell carcinoma of lung, right ([place]) 2019   Past Surgical History:  Procedure Laterality Date   COLONOSCOPY WITH PROPOFOL     COLONOSCOPY WITH PROPOFOL N/A 11/25/2018    Procedure: COLONOSCOPY WITH PROPOFOL;  Surgeon: Lollie Sails, MD;  Location: [facility] ENDOSCOPY;  Service: Endoscopy;  Laterality: N/A;   COLONOSCOPY WITH PROPOFOL N/A 11/29/2021   Procedure: COLONOSCOPY WITH PROPOFOL;  Surgeon: Toledo, Benay Pike, MD;  Location: [facility] ENDOSCOPY;  Service: Gastroenterology;  Laterality: N/A;   ENDARTERECTOMY FEMORAL Left 07/13/2020   Procedure: ENDARTERECTOMY FEMORAL ( SFA STENT);  Surgeon: Katha Cabal, MD;  Location: [facility] ORS;  Service: Vascular;  Laterality: Left;   ESOPHAGOGASTRODUODENOSCOPY (EGD) WITH PROPOFOL N/A 11/25/2018   Procedure: ESOPHAGOGASTRODUODENOSCOPY (EGD) WITH PROPOFOL;  Surgeon: Lollie Sails, MD;  Location[facility] END[facility]COPY;  Service: Endoscopy;  Laterality: N/A;   ESOPHAGOGASTRODUODENOSCOPY (EGD) WITH PROPOFOL N/A 11/29/2021   Procedure: ESOPHAGOGASTRODUODENOSCOPY (EGD) WITH PROPOFOL;  Surgeon: Toledo, Benay Pike, MD;  Location: [facility] ENDOSCOPY;  Service: Gastroenterology;  Laterality: N/A;   ESOPHAGOGASTRODUODENOSCOPY (EGD) WITH PROPOFOL N/A 12/08/2021   Procedure: ESOPHAGOGASTRODUODENOSCOPY (EGD) WITH PROPOFOL;  Surgeon: [name], MD;  Location: [facility] ENDOSCOPY;  Service: Gastroenterology;  Laterality: N/A;   LEG AMPUTATION ABOVE KNEE Left    LOWER EXTREMITY ANGIOGRAPHY Left 06/07/2020   Procedure: LOWER EXTREMITY ANGIOGRAPHY;  Surgeon: Katha Cabal, MD;  Location: [facility];  Service: Cardiovascular;  Laterality: Left;   LOWER EXTREMITY ANGIOGRAPHY Left 01/10/2021  Procedure: LOWER EXTREMITY ANGIOGRAPHY;  Surgeon: Katha Cabal, MD;  Location: [facility];  Service: Cardiovascular;  Laterality: Left;   Patient Active Problem List   Diagnosis Date Noted   UGIB (upper gastrointestinal bleed)    Malnutrition of moderate degree 12/06/2021   Melena 12/05/2021   COPD (chronic obstructive pulmonary disease) (HCC)    Grade II diastolic dysfunction    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), left (HCC)     AKI (acute kidney injury) ([place])    Atherosclerosis of artery of extremity with ulceration ([place]) 07/13/2020   Atherosclerosis of native arteries of the extremities with ulceration ([place]) [id number]   Alcoholic cirrhosis of liver without ascites ([place]) 04/09/2019   Hyperlipidemia, mixed 04/09/2019   Malignant 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 ([place]) 04/09/2019   Thrombocytopenia ([place]) 04/09/2019   CAD (coronary artery disease) 10/14/2017   Lung cancer ([place]) 10/14/2017   Tobacco abuse 08/14/2017   Benign essential hypertension 09/09/2013    REFERRING DIAG: [id number] Acquired absence of left leg above knee  ONSET DATE: 02/16/2022 new prosthesis delivery    THERAPY DIAG:  Unsteadiness on feet  Muscle weakness (generalized)  Abnormal posture  Other abnormalities of gait and mobility  Stiffness of left hip, not elsewhere classified  Rationale for Evaluation and Treatment Rehabilitation  PERTINENT HISTORY: PAD, CAD, angina, malignant neoplasm of right lung, HLD, alcoholic cirrhosis of liver, HTN, COPD   PRECAUTIONS: Fall  SUBJECTIVE:                                                                                                                                                                                     SUBJECTIVE STATEMENT:  He walked at [employer] pushing cart.  He is wearing prosthesis most of awake hours daily. He now understands PT recommendation for increased wear.     PAIN:  Are you having pain? Yes: NPRS scale: standing 0/10 Pain location: lumbar right & left sides Pain description: sore with muscles spasm Aggravating factors: standing & walking Relieving factors: sitting  OBJECTIVE: (objective measures completed at initial evaluation unless otherwise dated)  COGNITION: Overall cognitive status: Within functional limits for tasks assessed   POSTURE: rounded shoulders, forward head, flexed trunk , and weight shift right   LOWER EXTREMITY ROM:   ROM P:passive   A:active Left eval Left 04/11/22 Left 05/29/22  Hip flexion      Hip extension Standing A: -17* Standing A: -13* Standing A: -10*  Hip abduction      Hip adduction      Hip internal rotation      Hip external rotation      Knee flexion  Knee extension      Ankle dorsiflexion      Ankle plantarflexion      Ankle inversion      Ankle eversion       (Blank rows = not tested)   LOWER EXTREMITY MMT:   MMT Right eval Left eval  Hip flexion      Hip extension   4-/5  Hip abduction   4-/5  Hip adduction      Hip internal rotation      Hip external rotation   NA  Knee flexion 4/5 NA  Knee extension 4/5 NA  Ankle dorsiflexion   NA  Ankle plantarflexion   NA  Ankle inversion   NA  Ankle eversion   NA  (Blank rows = not tested)   TRANSFERS: 05/09/2022: Sit to / from stand using armrests of 18" chair and stabilizes without touching external support (close for safety).  03/07/2023:  Sit to stand: SBA requires use of UEs from chair without armrests to RW for stabilization.  Stand to sit: SBA requires use of UEs to chair without armrests from RW for stability.    GAIT: 05/29/2022: 6 Minute Walk Test with rollator walker & TFA prosthesis 555'  SpO2 98% pre & 99% post  Resting HR 63  post HR 85  Gait velocity: 1.72 ft/sec   103 ft /min  Physiological Cost Index  0.21  Patient amb community distances as above with rollator walker or RW modified independent.  Pt reports no issues with neg ramps & curbs with rollator walker or RW modified independent.    05/02/2022: 6 Minute Walk Test with rollator walker & TFA prosthesis 415'  Resting HR 63  post HR 86  Gait velocity: 1.50 ft/sec   64f /min  Physiological Cost Index  0.25    03/07/2023: Gait pattern: step to pattern, decreased step length- Right, decreased stance time- Left, circumduction- Left, Left hip hike, knee flexed in stance- Right, and trunk flexed Distance walked: 125' Assistive device utilized: WEnvironmental consultant- 2  wheeled & TFA prosthesis Level of assistance: SBA Gait velocity: 1.29 ft/sec Comments: pt amb 20' with cane stand alone tip with modA.    RAMP  03/07/2023: pt neg 12* ramp with RW & TFA prosthesis with SBA   CURB:  03/07/2023: pt neg 6" curb with RW & TFA prosthesis with SBA   FUNCTIONAL TESTs:  05/23/2022: BMerrilee JanskyBalance Scale: 28/56  03/07/2023: BMerrilee JanskyBalance Scale: 14/56    CURRENT PROSTHETIC WEAR ASSESSMENT: 04/28/2022: Patient is independent with: skin check, residual limb care, prosthetic cleaning, correct ply sock adjustment, proper wear schedule/adjustment, and proper weight-bearing schedule/adjustment Donning prosthesis: Modified independent Doffing prosthesis: Modified independent  03/07/2023:  Patient is independent with: skin check, residual limb care, and prosthetic cleaning Patient is dependent with: correct ply sock adjustment, proper wear schedule/adjustment, and proper weight-bearing schedule/adjustment Donning prosthesis: SBA with new suspension Doffing prosthesis: Modified independence Prosthetic wear tolerance: up to 5 hours, 1x/day, 14 of 19 days since delivery Prosthetic weight bearing tolerance: 5 minutes Edema: none Residual limb condition: pt & wife report no issues Prosthetic description: silicon liner with suction ring suspension, total contact socket with flexible inner liner, SAFETY knee, single axis foot K code/activity level with prosthetic use: Level 2       TODAY'S TREATMENT:  DATE:  05/29/2022: Prosthetic Training with TFA prosthesis: See objective data  Therapeutic Exercise: PT reviewed ongoing HEP exercises to include flexibility, strength, balance & endurance.  See updated HEP below.  Pt verbalized understanding.    05/22/2022: Prosthetic Training with TFA prosthesis: PT reviewed recommendation to wear prosthesis daily  from arising to bedtime except bathing.  This enables function / mobility throughout his day as prosthesis is available when he wants to move. Pt verbalized understanding.  Prosthesis was rotated on his limb causing a medial whip which changes timing for gait prosthetic knee flexion & extension and threw off his balance.  PT instructed in difference between rotator below socket that allow prosthetic knee & foot to be positioned in sitting without rotating socket  versus socket rotated on limb.  When socket is rotated on limb, he has to break bony lock created by socket design by sliding socket distally first, then turning socket on limb before seating limb into socket.  Pt verbalized understanding.  Pt amb 20' X 3 with min to modA with cane stand alone tip. Pt amb 40' to counter, 10' laps X 8, then 57' to w/c with cane with min to modA.  PT reviewed components of fitness program to include flexibility, strength, balance & endurance.  He appears to be addressing first 3 areas but is missing the endurance.  PT recommending 1x/day >/= 5 days / wk to walk max tolerable distance which for him is probably RLE vascular issues.  Pt verbalized understanding.   Neuromuscular Re-education: See Merrilee Jansky Balance in objective data. Worked on sit to/from stand using UEs on w/c but not touching external support to stabilize.    05/15/2022: Prosthetic Training with TFA prosthesis: Pt amb 80' & 68' with cane stand alone tip & HHA modA. Pt amb 30' with cane without HHA with minA to modA (one balance loss).  Standing at sink bw 2 chair backs: cane support moving LE forward then back to bilateral stance, side step then back to bilateral stance & backwards then back to bilateral stance. 5 reps ea. Moving prosthesis with close supervision. Moving RLE so stance on prosthesis with touch back of hand first 3 reps, then close supervision 2 reps.  Four-square stepping CW/CCW  with cane 3 reps ea with close supervision. Pt amb with  LUE hovering over counter RUE cane support with close supervision. 8' forward & backward 5 reps.     HOME EXERCISE PROGRAM: Standing at sink with chair backs to right & left and wheelchair behind you. With both hands place on object to upper shelf. With both hands turn to place object on back of each chair. With one hand lean to side place object in bottom of each chair on your sides. Turn around so sink is ~2" behind your bottom, place object in seat of w/c. Face sink, move prosthesis across in front of right leg. And back beside right leg. Move prosthesis forward try to hold position for 2-3 seconds without touching. And back beside right leg. Move prosthesis out to side try to hold position for 2-3 seconds without touching. And back beside right leg. Move prosthesis back behind you try to hold position for 2-3 seconds without touching. And back beside right leg. Move right leg across in front of prosthesis. try to hold position for 2-3 seconds without touching. And back beside left leg. Move right leg forward try to hold position for 2-3 seconds without touching. And back beside left leg. Move right leg to side  try to hold position for 2-3 seconds without touching. And back beside left leg. Move right leg back behind you try to hold position for 2-3 seconds without touching. And back beside left leg. Walk forward & backward with cane in right hand. 1st lap touching counter on top, 2nd lap touching counter with back of hand, 3rd lap not touching counter.  Walk to right & left side stepping (make sure you put weight on prosthesis prior to stepping right leg) with cane in right hand. 1st lap touching counter on top, 2nd lap touching counter with back of hand, 3rd lap not touching counter.   Access Code: [email or web address] URL: [email or web address] Date: 05/29/2022 Prepared by: Jamey Reas  Exercises - Modified [place] (Mirrored)  - 1-2 x daily - 7 x weekly - 1 sets - 2-3 reps  - 20-30 seconds hold - wide stance head motions eyes open  - 1-2 x daily - 7 x weekly - 1 sets - 10 reps - 2 seconds hold - Feet Apart with Eyes Closed with Head Motions  - 1-2 x daily - 7 x weekly - 1 sets - 10 reps - 2 seconds hold - Seated Hamstring Stretch with Strap  - 1-2 x daily - 7 x weekly - 1 sets - 2-3 reps - 30 seconds hold - Standing Gastroc Stretch on Step  - 1-2 x daily - 7 x weekly - 1 sets - 2-3 reps - 30 seconds hold - Seated Long Arc Quad with Ankle Weight  - 1 x daily - 4 x weekly - 2 sets - 15 reps - 5 seconds hold    ASSESSMENT:  CLINICAL IMPRESSION: Pt met all LTGs.  He reports ambulating in home with cane and community with RW.  His wife & he report significant increase activity level.      OBJECTIVE IMPAIRMENTS: Abnormal gait, cardiopulmonary status limiting activity, decreased activity tolerance, decreased balance, decreased endurance, decreased knowledge of use of DME, decreased mobility, decreased ROM, decreased strength, postural dysfunction, prosthetic dependency , and pain.    ACTIVITY LIMITATIONS: standing, stairs, transfers, locomotion level, and balance   PARTICIPATION LIMITATIONS: community activity and church   PERSONAL FACTORS: Fitness, Time since onset of injury/illness/exacerbation, and 3+ comorbidities: see PMH  are also affecting patient's functional outcome.    REHAB POTENTIAL: Good   CLINICAL DECISION MAKING: Evolving/moderate complexity   EVALUATION COMPLEXITY: Moderate     GOALS: Goals reviewed with patient? Yes   SHORT TERM GOALS: Target date: 05/04/2022   Patient reports walking in home >50% of time to change locations.  Baseline: SEE OBJECTIVE DATA Goal status: MET 05/02/2022 2.  Patient amb >200' with rollator walker on level surfaces & negotiates ramps / curbs safely. Baseline: SEE OBJECTIVE DATA Goal status: MET 05/02/2022   3.  Patient demonstrates understanding of updated HEP.  Baseline: SEE OBJECTIVE DATA Goal status:  MET  05/02/2022   LONG TERM GOALS: Target date: 05/31/2022   Patient demonstrates & verbalized understanding of prosthetic care to enable safe utilization of prosthesis. Baseline: SEE OBJECTIVE DATA Goal status: MET 05/29/2022   Patient tolerates daily prosthesis wear >50% of awake hours without skin or limb pain issues. Baseline: SEE OBJECTIVE DATA Goal status: MET 05/29/2022   Berg Balance >/= 18/56 indicate lower fall risk Baseline: SEE OBJECTIVE DATA Goal status: MET 05/23/2022   Patient ambulates >400' with prosthesis & RW modified independent Baseline: SEE OBJECTIVE DATA Goal status:  MET 05/29/2022   Patient negotiates ramps, curbs with RW & stairs  with single rail / cane with prosthesis modified independent. Baseline: SEE OBJECTIVE DATA Goal status: OMET 05/29/2022   Patient verbalizes & demonstrates understanding of ongoing HEP. Baseline: SEE OBJECTIVE DATA Goal status: MET 05/29/2022   PLAN:   PT FREQUENCY: 1x/week   PT DURATION: 12 weeks   PLANNED INTERVENTIONS: Therapeutic exercises, Therapeutic activity, Neuromuscular re-education, Balance training, Gait training, Patient/Family education, Self Care, Stair training, Vestibular training, Prosthetic training, and DME instructions   PLAN FOR NEXT SESSION:  discharge PT.     Jamey Reas, PT, DPT 05/29/2022, 12:59 PM

## 2022-07-27 ENCOUNTER — Other Ambulatory Visit: Payer: Self-pay | Admitting: Gastroenterology

## 2022-07-27 DIAGNOSIS — Z85118 Personal history of other malignant neoplasm of bronchus and lung: Secondary | ICD-10-CM

## 2022-07-27 DIAGNOSIS — Z720 Tobacco use: Secondary | ICD-10-CM

## 2022-07-27 DIAGNOSIS — K7030 Alcoholic cirrhosis of liver without ascites: Secondary | ICD-10-CM

## 2022-08-07 ENCOUNTER — Ambulatory Visit
Admission: RE | Admit: 2022-08-07 | Discharge: 2022-08-07 | Disposition: A | Discharge status: Home / Self Care | Payer: 59 | Source: Ambulatory Visit | Attending: Gastroenterology | Admitting: Gastroenterology

## 2022-08-07 DIAGNOSIS — Z85118 Personal history of other malignant neoplasm of bronchus and lung: Secondary | ICD-10-CM

## 2022-08-07 DIAGNOSIS — Z720 Tobacco use: Secondary | ICD-10-CM

## 2022-08-07 DIAGNOSIS — K7030 Alcoholic cirrhosis of liver without ascites: Secondary | ICD-10-CM

## 2022-08-07 MED ORDER — IOPAMIDOL (ISOVUE-300) INJECTION 61%
75.0000 mL | Freq: Once | INTRAVENOUS | Status: AC | PRN
  Administered 2022-08-07: 75 mL via INTRAVENOUS

## 2022-09-18 ENCOUNTER — Other Ambulatory Visit: Payer: Self-pay | Admitting: Cardiovascular Disease

## 2022-09-23 ENCOUNTER — Other Ambulatory Visit: Payer: Self-pay | Admitting: Cardiovascular Disease

## 2022-09-26 ENCOUNTER — Other Ambulatory Visit: Payer: Self-pay | Admitting: Cardiovascular Disease

## 2022-11-13 ENCOUNTER — Other Ambulatory Visit: Payer: Self-pay | Admitting: Cardiovascular Disease

## 2022-11-13 NOTE — Telephone Encounter (Signed)
Please contact pt for future appointment. Pt due for 12 month f/u. 

## 2022-11-15 ENCOUNTER — Telehealth: Payer: Self-pay | Admitting: Cardiovascular Disease

## 2022-11-15 NOTE — Telephone Encounter (Signed)
 Left voicemail to schedule appt from recall.

## 2022-11-15 NOTE — Telephone Encounter (Signed)
Left voice mail to schedule appt

## 2022-11-19 NOTE — Telephone Encounter (Signed)
Pt is scheduled on 10/18

## 2022-11-28 ENCOUNTER — Other Ambulatory Visit: Payer: Self-pay | Admitting: Cardiovascular Disease

## 2022-12-31 ENCOUNTER — Other Ambulatory Visit: Payer: Self-pay | Admitting: Cardiovascular Disease

## 2023-01-17 NOTE — Progress Notes (Signed)
Cardiology Office Note  Date:  01/18/2023   ID:  Samuel Carney, Factor 04/11/1945, MRN [id number]  PCP:  Dorothey Baseman, MD   Chief Complaint  Patient presents with   Follow-up    12 month f/u. Patient feels well today. Medication reviewed verbally.     HPI:  Samuel Carney is a 77 y.o. male hypertension lifelong smoker, 50-pack-year smoking history, COPD Alcohol, with cirrhosis, followed by GI CT scan of the chest  right upper lobe mass.  Prior radiation to lung for mass Stress test July 2019 PAD L CFA Endartectomy and L SFA/Pop/PT stent 07/13/20 c/b complete occlusions of LLE stent graft with resultant nonhealing left foot wound and diminished perfusion c/w Chronic limb-threatening ischemia 12/22 S/p amputation He presents for follow-up of his shortness of breath and coronary atherosclerosis on CT scan, peripheral arterial disease  LOV with myself 8/23 Presents with his stepson today (just completed working third shift)  Reports that he stopped smoking soon after his last clinic appointment  Ambulates with prosthesis using a walker Presents to the wheelchair  Overall no complaints, denies chest pain, no shortness of breath  Prior results reviewed Moderate disease right lower extremity by ABIs   Labs reviewed Total chol 103, LDL 45 A1C 5.4  EKG personally reviewed by myself on todays visit EKG Interpretation Date/Time:  Friday January 18 2023 08:53:12 EDT Ventricular Rate:  78 PR Interval:  192 QRS Duration:  84 QT Interval:  346 QTC Calculation: 394 R Axis:   8  Text Interpretation: Sinus rhythm with occasional Premature ventricular complexes T wave abnormality, consider lateral ischemia When compared with ECG of 05-Dec-2021 19:25, No significant change was found Confirmed by Samuel Carney [phone]) on 01/18/2023 8:55:16 AM   Seen at [facility] 12/22 L CFA Endartectomy and L SFA/Pop/PT stent 07/13/20 c/b complete occlusions of LLE stent graft with resultant nonhealing  left foot wound and diminished perfusion c/w 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 Left ventricular ejection fraction, by estimation, is 60 to 65%. The  left ventricle has normal function. The left ventricle has no regional  wall motion abnormalities. There is moderate left ventricular hypertrophy.  Left ventricular diastolic  parameters are consistent with Grade II diastolic dysfunction  (pseudonormalization).   2. Right ventricular systolic function is normal. The right ventricular  size is normal. Tricuspid regurgitation signal is inadequate for assessing  PA pressure.   3. Left atrial size was mildly dilated.   4. The mitral valve is normal in structure. Mild mitral valve  regurgitation. No evidence of mitral stenosis.   5. The aortic valve is normal in structure. Aortic valve regurgitation is  not visualized. Aortic valve sclerosis/calcification is present, without  any evidence of aortic stenosis.   6. The inferior vena cava is normal in size with greater than 50%  respiratory variability, suggesting right atrial pressure of 3 mmHg.   Myoview 05/2020: Low risk  ABI 7/23 Right ankle brachial index (ABI) of 0.75 is consistent with moderate  arterial occlusive disease at rest. Continuous wave Doppler waveforms at  the ankle of the posterior tibial artery were monophasic and monophasic at  the dorsalis pedis artery. Digit pressure is consistent with borderline  healing.   CT 01/2019 Stable appearance of post radiation fibrosis and consolidation of the anterior right lung apex, which almost completely obscures a nodule more discretely appreciated on prior examinations, measuring approximately 1.3 x 1.1 cm (series 3, image 31). 2. Stable prominent pretracheal and  mediastinal lymph nodes  3.  Emphysema (ICD10-J43.9). 4. Coronary artery disease. Severe mixed aortic atherosclerosis. Aortic Atherosclerosis (ICD10-I70.0).  Lab work reviewed   total chol 123, LDL 48  Stress test July 2019 no ischemia  Echocardiogram December 2020 normal ejection fraction mild LVH grade 2 diastolic dysfunction moderate MR moderately dilated left atrium mild to moderately elevated right heart pressures  Followed by GI for 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 Inez Carney for consultation of his possible lung cancer Felt to be stage I carcinoma the lung based on CT scan and PET scan Biopsy performed 10/13/2017  CT scan with extensive coronary atherosclerosis Mild diffuse carotid disease aortic disease Moderate to heavy coronary calcification all 3 vessels  Last chest CT scan October 2020, followed by oncology Stable appearance of post radiation fibrosis and consolidation anterior right lung apex, appears nodule  PMH:   has a past medical history of Alcohol abuse, Aortic atherosclerosis (HCC), B12 deficiency, Cirrhosis (HCC), COPD (chronic obstructive pulmonary disease) (HCC), Coronary artery disease, GERD (gastroesophageal reflux disease), Glaucoma, Grade II diastolic dysfunction, History of kidney stones, HLD (hyperlipidemia), radiation therapy, Hypertension, Lung mass, Moderate mitral regurgitation, Pneumonia, PVD (peripheral vascular disease) (HCC), and Squamous cell carcinoma of lung, right (HCC) (2019).smoker, cOPD, coronary artery disease  PSH:    Past Surgical History:  Procedure Laterality Date   COLONOSCOPY WITH PROPOFOL     COLONOSCOPY WITH PROPOFOL N/A 11/25/2018   Procedure: COLONOSCOPY WITH PROPOFOL;  Surgeon: Christena Deem, MD;  Location: [facility] ENDOSCOPY;  Service: Endoscopy;  Laterality: N/A;   COLONOSCOPY WITH PROPOFOL N/A 11/29/2021   Procedure: COLONOSCOPY WITH PROPOFOL;  Surgeon: Toledo, Boykin Nearing, MD;  Location: [facility] ENDOSCOPY;  Service:  Gastroenterology;  Laterality: N/A;   ENDARTERECTOMY FEMORAL Left 07/13/2020   Procedure: ENDARTERECTOMY FEMORAL ( SFA STENT);  Surgeon: Renford Dills, MD;  Location: [facility] ORS;  Service: Vascular;  Laterality: Left;   ESOPHAGOGASTRODUODENOSCOPY (EGD) WITH PROPOFOL N/A 11/25/2018   Procedure: ESOPHAGOGASTRODUODENOSCOPY (EGD) WITH PROPOFOL;  Surgeon: Christena Deem, MD;  Location: [facility] ENDOSCOPY;  Service: Endoscopy;  Laterality: N/A;   ESOPHAGOGASTRODUODENOSCOPY (EGD) WITH PROPOFOL N/A 11/29/2021   Procedure: ESOPHAGOGASTRODUODENOSCOPY (EGD) WITH PROPOFOL;  Surgeon: Toledo, Boykin Nearing, MD;  Location: [facility] ENDOSCOPY;  Service: Gastroenterology;  Laterality: N/A;   ESOPHAGOGASTRODUODENOSCOPY (EGD) WITH PROPOFOL N/A 12/08/2021   Procedure: ESOPHAGOGASTRODUODENOSCOPY (EGD) WITH PROPOFOL;  Surgeon: Wyline Mood, MD;  Location: [facility] ENDOSCOPY;  Service: Gastroenterology;  Laterality: N/A;   LEG AMPUTATION ABOVE KNEE Left    LOWER EXTREMITY ANGIOGRAPHY Left 06/07/2020   Procedure: LOWER EXTREMITY ANGIOGRAPHY;  Surgeon: Renford Dills, MD;  Location: [facility] INVASIVE CV LAB;  Service: Cardiovascular;  Laterality: Left;   LOWER EXTREMITY ANGIOGRAPHY Left 01/10/2021   Procedure: LOWER EXTREMITY ANGIOGRAPHY;  Surgeon: Renford Dills, MD;  Location: [facility] INVASIVE CV LAB;  Service: Cardiovascular;  Laterality: Left;    Current Outpatient Medications  Medication Sig Dispense Refill   acetaminophen (TYLENOL) 500 MG tablet Take 1,000 mg by mouth every 6 (six) hours as needed for moderate pain or mild pain.     amLODipine (NORVASC) 10 MG tablet TAKE 1 TABLET BY MOUTH ONCE DAILY . APPOINTMENT REQUIRED FOR FUTURE REFILLS 30 tablet 0   aspirin EC 81 MG EC tablet Take 1 tablet (81 mg total) by mouth daily at 6 (six) AM. Swallow whole. 90 tablet 3  dorzolamide-timolol (COSOPT) 22.3-6.8 MG/ML ophthalmic solution Place 1 drop into both eyes 2 (two) times daily.  6   doxazosin (CARDURA) 2 MG tablet TAKE 1  TABLET BY MOUTH ONCE DAILY . APPOINTMENT REQUIRED FOR FUTURE REFILLS 30 tablet 0   FEROSUL 325 (65 Fe) MG tablet Take 325 mg by mouth every morning.     ferrous fumarate-b12-vitamic C-folic acid (TRINSICON / FOLTRIN) capsule Take 1 capsule by mouth 2 (two) times daily after a meal. 60 capsule 2   gabapentin (NEURONTIN) 300 MG capsule Take 1 capsule (300 mg total) by mouth at bedtime. 30 capsule 3   metoprolol tartrate (LOPRESSOR) 100 MG tablet Take 1 tablet (100 mg total) by mouth 2 (two) times daily. Please hold metoprolol until you see your doctor 180 tablet 3   Misc. Devices (ROLLATOR ULTRA-LIGHT) MISC Use 1 each once daily Rollator     pantoprazole (PROTONIX) 40 MG tablet Take 1 tablet (40 mg total) by mouth 2 (two) times daily. 60 tablet 1   polyethylene glycol (MIRALAX / GLYCOLAX) 17 g packet Take 17 g by mouth daily. 14 each 0   rosuvastatin (CRESTOR) 5 MG tablet TAKE 1 TABLET BY MOUTH ONCE DAILY. NEED APPOINTMENT FOR MORE REFILLS 30 tablet 1   telmisartan (MICARDIS) 80 MG tablet TAKE 1 TABLET BY MOUTH ONCE DAILY . APPOINTMENT REQUIRED FOR FUTURE REFILLS 30 tablet 0   Travoprost, BAK Free, (TRAVATAN) 0.004 % SOLN ophthalmic solution Place 1 drop into both eyes at bedtime.     varenicline (CHANTIX) 1 MG tablet Take 1 mg by mouth daily.     No current facility-administered medications for this visit.     Allergies:   Patient has no known allergies.   Social History:  The patient  reports that he has been smoking cigarettes. He has a 50 pack-year smoking history. He has never used smokeless tobacco. He reports that he does not currently use alcohol. He reports that he does not use drugs.   Family History:   family history includes Diabetes Mellitus II in his brother, maternal uncle, and mother; Stroke in his brother.   Review of Systems: Review of Systems  Constitutional: Negative.   HENT: Negative.    Respiratory:  Positive for shortness of breath.   Cardiovascular: Negative.    Gastrointestinal: Negative.   Musculoskeletal: Negative.   Neurological: Negative.   Psychiatric/Behavioral: Negative.    All other systems reviewed and are negative.   PHYSICAL EXAM: VS:  BP 116/68 (BP Location: Left Arm, Patient Position: Sitting, Cuff Size: Normal)   Pulse 78   Ht 5\' 7"  (1.702 m)   Wt 160 lb 9.6 oz (72.8 kg)   SpO2 97%   BMI 25.15 kg/m  , BMI Body mass index is 25.15 kg/m. Constitutional:  oriented to person, place, and time. No distress.  HENT:  Head: Grossly normal Eyes:  no discharge. No scleral icterus.  Neck: No JVD, no carotid bruits  Cardiovascular: Regular rate and rhythm, no murmurs appreciated Pulmonary/Chest: Moderately decreased breath sounds throughout, scattered Rales Abdominal: Soft.  no distension.  no tenderness.  Musculoskeletal: Normal range of motion, above-knee amputation left lower extremity Neurological:  normal muscle tone. Coordination normal. No atrophy Skin: Skin warm and dry Psychiatric: normal affect, pleasant   Recent Labs: No results found for requested labs within last 365 days.    Lipid Panel No results found for: "CHOL", "HDL", "LDLCALC", "TRIG"    Wt Readings from Last 3 Encounters:  01/18/23 160 lb 9.6 oz (72.8  kg)  12/05/21 137 lb 5.6 oz (62.3 kg)  11/29/21 140 lb (63.5 kg)    ASSESSMENT AND PLAN:  Malignant 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 [facility]) Completed radiation, followed by oncology Last CT scan May 2024, no evidence of  residual /recurrent disease Previously followed by oncology, Dr. Orlie Dakin Reports that he quit smoking  Cad with stable angina Heavy coronary calcification all 3 vessels as well as diffuse aortic atherosclerosis Completed Myoview March 2020 to low risk study Currently with no symptoms of angina. No further workup at this time. Continue current medication regimen. Reports that he quit smoking  Tobacco abuse Quit smoking last year after long smoking history of 50  years  Benign essential hypertension Blood pressure is well controlled on today's visit. No changes made to the medications.  Hyperlipidemia Continue Crestor 5 milligrams daily Cholesterol at goal last year  Alcohol abuse Alcohol cessation recommended  PAD History of L CFA Endartectomy and L SFA/Pop/PT stent 07/13/20 c/b complete occlusions of LLE stent graft with resultant nonhealing left foot wound and diminished perfusion c/w Chronic limb-threatening ischemia 12/22 Followed by amputation left leg above the knee Prior ABIs with moderately decreased perfusion right leg Repeat aortoiliac and right leg arterial Dopplers ordered  Orders Placed This Encounter  Procedures   EKG 12-Lead      Signed, Dossie Arbour, M.D., Ph.D. 01/18/2023  [facility] [place], [place] [phone]

## 2023-01-18 ENCOUNTER — Ambulatory Visit: Payer: 59 | Attending: Cardiovascular Disease | Admitting: Cardiovascular Disease

## 2023-01-18 ENCOUNTER — Encounter: Payer: Self-pay | Admitting: Cardiovascular Disease

## 2023-01-18 VITALS — BP 116/68 | HR 78 | Ht 67.0 in | Wt 160.6 lb

## 2023-01-18 DIAGNOSIS — E782 Mixed hyperlipidemia: Secondary | ICD-10-CM

## 2023-01-18 DIAGNOSIS — R011 Cardiac murmur, unspecified: Secondary | ICD-10-CM

## 2023-01-18 DIAGNOSIS — I10 Essential (primary) hypertension: Secondary | ICD-10-CM | POA: Diagnosis not present

## 2023-01-18 DIAGNOSIS — I739 Peripheral vascular disease, unspecified: Secondary | ICD-10-CM

## 2023-01-18 DIAGNOSIS — F1010 Alcohol abuse, uncomplicated: Secondary | ICD-10-CM

## 2023-01-18 DIAGNOSIS — I25118 Atherosclerotic heart disease of native coronary artery with other forms of angina pectoris: Secondary | ICD-10-CM | POA: Diagnosis not present

## 2023-01-18 DIAGNOSIS — J449 Chronic obstructive pulmonary disease, unspecified: Secondary | ICD-10-CM | POA: Diagnosis not present

## 2023-01-18 DIAGNOSIS — R0600 Dyspnea, unspecified: Secondary | ICD-10-CM

## 2023-01-18 DIAGNOSIS — Z720 Tobacco use: Secondary | ICD-10-CM

## 2023-01-18 MED ORDER — ROSUVASTATIN CALCIUM 5 MG PO TABS: 5.0000 mg | ORAL_TABLET | Freq: Every day | ORAL | 3 refills | Status: DC

## 2023-01-18 MED ORDER — AMLODIPINE BESYLATE 10 MG PO TABS: 10.0000 mg | ORAL_TABLET | Freq: Every day | ORAL | 3 refills | Status: DC

## 2023-01-18 MED ORDER — TELMISARTAN 80 MG PO TABS: 80.0000 mg | ORAL_TABLET | Freq: Every day | ORAL | 3 refills | Status: DC

## 2023-01-18 MED ORDER — DOXAZOSIN MESYLATE 2 MG PO TABS: 2.0000 mg | ORAL_TABLET | Freq: Every day | ORAL | 3 refills | Status: DC

## 2023-01-18 NOTE — Patient Instructions (Addendum)
Medication Instructions:  No changes  If you need a refill on your cardiac medications before your next appointment, please call your pharmacy.   Lab work: No new labs needed  Testing/Procedures: Your physician has recommended that you have an AORTA/ILIAC Duplex. This is a noninvasive diagnostic test that uses ultrasound technology to look at the aorta and iliac arteries to detect any restriction to blood flow to the buttocks, groin, legs or feet.  No food after 11PM the night before.  Water is OK. (Don't drink liquids if you have been instructed not to for ANOTHER test). Avoid foods that produce bowel gas, for 24 hours prior to exam (see below). No breakfast, no chewing gum, no smoking or carbonated beverages. Patient may take morning medications with water. Come in for test at least 15 minutes early to register. This will take approximately 60 minutes This will take place at [address], [place] [address]   Your physician has requested that you have a lower extremity arterial duplex. During this test, ultrasound is used to evaluate arterial blood flow in the legs. Allow one hour for this exam. There are no restrictions or special instructions. This will take place at [address] ([address]) [address], [place] [address]   Follow-Up: At [facility], you and your health needs are our priority.  As part of our continuing mission to provide you with exceptional heart care, we have created designated Provider Care Teams.  These Care Teams include your primary Cardiologist (physician) and Advanced Practice Providers (APPs -  Physician Assistants and Nurse Practitioners) who all work together to provide you with the care you need, when you need it.  You will need a follow up appointment in 12 months  Providers on your designated Care Team:   Nicolasa Ducking, NP Eula Listen, PA-C Cadence Fransico Michael, [place]  COVID-19 Vaccine Information can be  found at: [email or web address] For questions related to vaccine distribution or appointments, please email [email or web address] or call [phone].

## 2023-01-24 ENCOUNTER — Other Ambulatory Visit: Payer: Self-pay | Admitting: Cardiovascular Disease

## 2023-01-24 DIAGNOSIS — I739 Peripheral vascular disease, unspecified: Secondary | ICD-10-CM

## 2023-01-30 ENCOUNTER — Other Ambulatory Visit: Payer: Self-pay | Admitting: General Surgery

## 2023-01-30 DIAGNOSIS — K7030 Alcoholic cirrhosis of liver without ascites: Secondary | ICD-10-CM

## 2023-01-31 ENCOUNTER — Ambulatory Visit: Payer: 59 | Attending: Cardiovascular Disease

## 2023-01-31 DIAGNOSIS — I739 Peripheral vascular disease, unspecified: Secondary | ICD-10-CM

## 2023-02-01 ENCOUNTER — Ambulatory Visit: Payer: 59 | Attending: Cardiovascular Disease

## 2023-02-01 DIAGNOSIS — Z95828 Presence of other vascular implants and grafts: Secondary | ICD-10-CM | POA: Diagnosis not present

## 2023-02-01 DIAGNOSIS — I739 Peripheral vascular disease, unspecified: Secondary | ICD-10-CM

## 2023-02-01 LAB — VAS US ABI WITH/WO TBI: Right ABI: 0.88

## 2023-02-06 ENCOUNTER — Other Ambulatory Visit: Payer: Self-pay | Admitting: Gastroenterology

## 2023-02-06 DIAGNOSIS — K7689 Other specified diseases of liver: Secondary | ICD-10-CM

## 2023-02-06 DIAGNOSIS — K7030 Alcoholic cirrhosis of liver without ascites: Secondary | ICD-10-CM

## 2023-02-11 ENCOUNTER — Ambulatory Visit
Admission: RE | Admit: 2023-02-11 | Discharge: 2023-02-11 | Disposition: A | Discharge status: Home / Self Care | Payer: 59 | Source: Ambulatory Visit | Attending: Gastroenterology | Admitting: Gastroenterology

## 2023-02-11 DIAGNOSIS — K7030 Alcoholic cirrhosis of liver without ascites: Secondary | ICD-10-CM

## 2023-02-11 DIAGNOSIS — K7689 Other specified diseases of liver: Secondary | ICD-10-CM | POA: Diagnosis present

## 2023-02-11 MED ORDER — GADOBUTROL 1 MMOL/ML IV SOLN
7.0000 mL | Freq: Once | INTRAVENOUS | Status: AC | PRN
  Administered 2023-02-11: 7 mL via INTRAVENOUS

## 2023-03-14 ENCOUNTER — Other Ambulatory Visit: Payer: Self-pay

## 2023-03-14 ENCOUNTER — Ambulatory Visit (INDEPENDENT_AMBULATORY_CARE_PROVIDER_SITE_OTHER): Payer: 59 | Admitting: Physical Therapy

## 2023-03-14 ENCOUNTER — Encounter: Payer: 59 | Admitting: Physical Therapy

## 2023-03-14 ENCOUNTER — Encounter: Payer: Self-pay | Admitting: Physical Therapy

## 2023-03-14 DIAGNOSIS — R2689 Other abnormalities of gait and mobility: Secondary | ICD-10-CM | POA: Diagnosis not present

## 2023-03-14 DIAGNOSIS — R2681 Unsteadiness on feet: Secondary | ICD-10-CM | POA: Diagnosis not present

## 2023-03-14 NOTE — Therapy (Addendum)
OUTPATIENT PHYSICAL THERAPY PROSTHETIC EVALUATION   Patient Name: Samuel Carney MRN: [id number] DOB:1945-05-18, 77 y.o., male Today's Date: 03/14/2023  PCP: Dorothey Baseman, MD  REFERRING PROVIDER: Marcello Fennel, MD  END OF SESSION:  PT End of Session - 03/14/23 1247     Visit Number 1    Number of Visits 2    Date for PT Re-Evaluation 03/28/23    Authorization Type [employer] Medicare    PT Start Time 0848    PT Stop Time 0932    PT Time Calculation (min) 44 min    Equipment Utilized During Treatment Gait belt    Activity Tolerance Patient tolerated treatment well    Behavior During Therapy WFL for tasks assessed/performed             Past Medical History:  Diagnosis Date   Alcohol abuse    Aortic atherosclerosis (HCC)    B12 deficiency    Cirrhosis (HCC)    COPD (chronic obstructive pulmonary disease) (HCC)    Coronary artery disease    GERD (gastroesophageal reflux disease)    Glaucoma    Grade II diastolic dysfunction    History of kidney stones    HLD (hyperlipidemia)    Hx of radiation therapy    Hypertension    Lung mass    Moderate mitral regurgitation    Pneumonia    PVD (peripheral vascular disease) (HCC)    Squamous cell carcinoma of lung, right (HCC) 2019   Past Surgical History:  Procedure Laterality Date   COLONOSCOPY WITH PROPOFOL     COLONOSCOPY WITH PROPOFOL N/A 11/25/2018   Procedure: COLONOSCOPY WITH PROPOFOL;  Surgeon: Christena Deem, MD;  Location: [facility] ENDOSCOPY;  Service: Endoscopy;  Laterality: N/A;   COLONOSCOPY WITH PROPOFOL N/A 11/29/2021   Procedure: COLONOSCOPY WITH PROPOFOL;  Surgeon: Toledo, Boykin Nearing, MD;  Location: [facility] ENDOSCOPY;  Service: Gastroenterology;  Laterality: N/A;   ENDARTERECTOMY FEMORAL Left 07/13/2020   Procedure: ENDARTERECTOMY FEMORAL ( SFA STENT);  Surgeon: Renford Dills, MD;  Location: [facility] ORS;  Service: Vascular;  Laterality: Left;   ESOPHAGOGASTRODUODENOSCOPY (EGD) WITH PROPOFOL N/A  11/25/2018   Procedure: ESOPHAGOGASTRODUODENOSCOPY (EGD) WITH PROPOFOL;  Surgeon: Christena Deem, MD;  Location: [facility] ENDOSCOPY;  Service: Endoscopy;  Laterality: N/A;   ESOPHAGOGASTRODUODENOSCOPY (EGD) WITH PROPOFOL N/A 11/29/2021   Procedure: ESOPHAGOGASTRODUODENOSCOPY (EGD) WITH PROPOFOL;  Surgeon: Toledo, Boykin Nearing, MD;  Location: [facility] ENDOSCOPY;  Service: Gastroenterology;  Laterality: N/A;   ESOPHAGOGASTRODUODENOSCOPY (EGD) WITH PROPOFOL N/A 12/08/2021   Procedure: ESOPHAGOGASTRODUODENOSCOPY (EGD) WITH PROPOFOL;  Surgeon: Wyline Mood, MD;  Location: [facility] ENDOSCOPY;  Service: Gastroenterology;  Laterality: N/A;   LEG AMPUTATION ABOVE KNEE Left    LOWER EXTREMITY ANGIOGRAPHY Left 06/07/2020   Procedure: LOWER EXTREMITY ANGIOGRAPHY;  Surgeon: Renford Dills, MD;  Location: [facility] INVASIVE CV LAB;  Service: Cardiovascular;  Laterality: Left;   LOWER EXTREMITY ANGIOGRAPHY Left 01/10/2021   Procedure: LOWER EXTREMITY ANGIOGRAPHY;  Surgeon: Renford Dills, MD;  Location: [facility] INVASIVE CV LAB;  Service: Cardiovascular;  Laterality: Left;   Patient Active Problem List   Diagnosis Date Noted   UGIB (upper gastrointestinal bleed)    Malnutrition of moderate degree 12/06/2021   Melena 12/05/2021   COPD (chronic obstructive pulmonary disease) (HCC)    Grade II diastolic dysfunction    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), left (HCC)    AKI (acute kidney injury) (HCC)    Atherosclerosis of artery of extremity with ulceration (HCC) 07/13/2020  Atherosclerosis of native arteries of the extremities with ulceration (HCC) 05/19/2020   Alcoholic cirrhosis of 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) 04/09/2019   Hyperlipidemia, mixed 04/09/2019   Malignant 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) 04/09/2019   Thrombocytopenia (HCC) 04/09/2019   CAD (coronary artery disease) 10/14/2017   Lung cancer (HCC) 10/14/2017   Tobacco abuse 08/14/2017   Benign essential hypertension 09/09/2013    ONSET  DATE: 03/06/2023 MD referral to PT for prosthetic prescription  REFERRING DIAG: 89.612 (ICD-10-CM) - Acquired absence of left leg above knee   THERAPY DIAG:  Unsteadiness on feet - Plan: PT plan of care cert/re-cert  Other abnormalities of gait and mobility - Plan: PT plan of care cert/re-cert  Rationale for Evaluation and Treatment: Rehabilitation  SUBJECTIVE:   SUBJECTIVE STATEMENT: This 77yo male underwent a 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 He got his first prosthesis on 05/29/2021 and socket revision with new suspension type on prosthesis on 02/16/2022. He got new socket 02/14/2023 due to shrinkage of limb.  He wants a computerized prosthesis to help with his mobility.  Pt accompanied by: significant other and family member  PERTINENT HISTORY: PAD, CAD, angina, malignant neoplasm of right lung, HLD, alcoholic cirrhosis of liver, HTN, COPD   PAIN:  Are you having pain? No  PRECAUTIONS: Fall  WEIGHT BEARING RESTRICTIONS: No  FALLS: Has patient fallen in last 6 months? No  LIVING ENVIRONMENT: Lives with: lives with their spouse Lives in: House Home Access: Stairs to enter and Ramped entrance Home layout: One level and single step into washer / dryer Stairs: Yes: Internal: 1 steps; none and External: 2 steps; on left going up Has following equipment at home: Single point cane, Walker - 2 wheeled, Crutches, Wheelchair (manual), shower chair, and Ramped entry  OCCUPATION: Retired  PLOF:  walks independently with prosthesis and RW in community.  He uses cane in home.   PATIENT GOALS: to get a microprocessor prosthesis to improve his mobility in home and community  OBJECTIVE:  COGNITION: Overall cognitive status: Within functional limits for tasks assessed   SENSATION: WFL  POSTURE: rounded shoulders, forward head, and flexed trunk   CARDIOVASCULAR RESPONSE: 03/14/2023 - Evaluation with current prosthesis: Functional activity: gait &  balance assessments Pre-activity vitals: HR: 86 SpO2: 97% Post-activity vitals: HR: 105 SpO2: 98% Modified Borg scale for dyspnea: 2: mild shortness of breath  TRANSFERS: 03/14/2023 - Evaluation with current prosthesis: Sit to stand: Modified independence requires UE assist on chair but stabilizes without external support.  Stand to sit: Modified independence BUE on chair to control descent.   FUNCTIONAL TESTs:  03/14/2023 - Evaluation with current prosthesis: Timed Up & Go with RW 38.94 sec; with cane 80.69 sec with minA.   GAIT: 03/14/2023 - Evaluation with current prosthesis: Gait pattern: step to pattern, decreased arm swing- Left, decreased step length- Right, decreased stance time- Left, antalgic, and trunk flexed Distance walked: >300' with RW & 100' with cane Assistive device utilized: Single point cane, Walker - 2 wheeled, and TFA prosthesis Level of assistance: Modified independence Gait velocity: RW support self-selected comfortable pace 1.54 ft/sec; fast pace 1.70  ft/sec;  with cane self-selected comfortable pace  0.57 ft/sec   AMPUTEE MOBILITY PREDICTOR ASSESSMENT TOOL Initial instructions: Client is seated in a hard chair with arms. The following manoeuvres are tested with or without the use of the prosthesis.  Advise the person of each task or group of tasks prior to performance.  Please avoid unnecessary chatter  throughout the test.  Safety First, no task should be performed if either the tester or client is uncertain of a safe outcome.  TASK SCORING GUIDELINES SCORE OPTIONS EVALUATION WITH CURRENT PROSTHESIS 03/14/2023 EVALUATION WITH MICROPROSCESSOR PROSTHESIS  1. Sitting Balance: Sit forward in a chair with arms folded across chest for 60s. Cannot sit upright independently for 60s Can sit upright independently for 60s = 0 = 1  1   2. Sitting reach:  Reach forwards and grasp the ruler.  (Tester holds ruler 12in beyond extended arms midline to the sternum) Does  not attempt Cannot grasp or requires arm support Reaches forward and successfully grasps item.  = 0 = 1  = 2    2   3. Chair to chair transfer: 2 chairs at 90. Pt. may choose direction and use their upper limbs. Cannot do or requires physical assistance Performs independently, but appears unsteady Performs independently, appears to be steady and safe = 0  = 1 = 2  2   4. Arises from a chair: Ask pt. to fold arms across chest and stand. If unable, use arms or assistive device. Unable without help (physical assistance) Able, uses arms/assist device to help Able, without using arms = 0  = 1 = 2   1   5. Attempts to arise from a chair: (stopwatch ready) If attempt in no. 4. was without arms then ignore and allow another attempt without penalty. Unable without help (physical assistance) Able requires >1 attempt Able to rise one attempt = 0  = 1 = 2   2   6. Immediate Standing Balance: (first 5s) Begin timing immediately. Unsteady (staggers, moves foot, sways ) Steady using walking aid or other support Steady without walker or other support = 0 = 1  = 2  2   7. Standing Balance (30s): (stopwatch ready) For item no.'s 7 & 8, first attempt is without assistive device.  If support is required allow after first attempt Unsteady Steady but uses walking aid or other support Standing without support = 0  = 1 = 2    2   8. Single limb standing balance: (stopwatch ready) Time the duration of single limb standing on both the sound and prosthetic limb up to 30s.   Non-prosthetic side Unsteady Steady but uses walking aid or other support for 30s Single-limb standing without support for 30s  Prosthetic Side Unsteady Steady but uses walking aid or other support for 30s Single-limb standing without support for 30s  = 0 = 1  = 2    = 0  = 1  = 2    1     1    9. Standing reach: Reach forward and grasp the ruler.  (Tester holds ruler 12in beyond extended  arm(s) midline to the sternum) Does not attempt Cannot grasp or requires arm support on assistive device Reaches forward and successfully grasps item no support = 0  = 1  = 2   1   10. Nudge test: With feet as close together as possible, examiner pushes lightly on pt.'s sternum with palm of hand 3 times (toes should rise) Begins to fall Staggers, grabs, catches self ore uses assistive device Steady = 0  = 1 = 2   2   11. Eyes Closed: (at maximum position #7) If support is required grade as unsteady. Unsteady or grips assistive device Steady without any use of assistive device = 0  = 1  1  12. Pick up objects off the floor: Pick up a pencil off the floor placed midline 12in in front of foot. Unable to pick up object and return to standing Performs with some help (table, chair, walking aid etc) Performs independently (without help) = 0  = 1   = 2  1   13. Sitting down:  Ask pt. to fold arms across chest and sit. If unable, use arm or assistive device. Unsafe (misjudged distance, falls into chair ) Uses arms, assistive device or not a smooth motion Safe, smooth motion = 0  = 1 = 2   1   14. Initiation of gait: (immediately after told to "go") Any hesitancy or multiple attempts to start No hesitancy = 0 = 1  1   15. Step length and height: Walk a measured distance of 15ft twice (up and back). Four scores are required or two scores (a. & b.) for each leg. "Marked deviation" is defined as extreme substitute movements to avoid clearing the floor. a. Swing Foot Does not advance a minimum of 12in Advances a minimum of 12in  b. Foot Clearance Foot does not completely clear floor without deviation Foot completely clears floor without marked deviation  = 0  = 1   = 0 = 1 Prosthesis  1   1 Sound  1   1 Prosthesis      Sound         16. Step Continuity Stopping or discontinuity between steps (stop & go gait) Steps appear continuous = 0  =  1  0   17. Turning:  180 degree turn when returning to chair. Unable to turn, requires intervention to prevent falling Greater than three steps but completes task without intervention No more than three continuous steps with or without assistive aid = 0  = 1  = 2    1   18. Variable cadence:  Walk a distance of 79ft fast as possible safely 4 times.  (Speeds may vary from slow to fast and fast to slow varying cadence) Unable to vary cadence in a controlled manner Asymmetrical increase in cadence controlled manner Symmetrical increase in speed in a controlled manner  = 0 = 1 = 2      0   19. Stepping over an obstacle: Place a movable box of 4in in height in the walking path. Cannot step over the box Catches foot, interrupts stride Steps over without interrupting stride = 0 = 1 = 2   1   20. Stairs (must have at least 2 steps):  Try to go up and down these stairs without holding on to the railing.  Don't hesitate to permit pt. to hold on to rail.  Safety First, if examiner feels that any risk in involved omit and score as 0.  Ascending Unsteady, cannot do One step at a time, or must hold on to railing or device Step over step, does not hold onto the railing or device  Descending Unsteady, cannot do One step at a time, or must hold on to railing or device Step over step, does not hold onto the railing or device  = 0  = 1 = 2    = 0  = 1 = 2   1     1    21. Assistive device selection:  Add points for the use of an assistive device if used for two or more items.  If testing without prosthesis use of appropriate assistive device is  mandatory.   Bed bound Wheelchair / Parallel Bars Walker Crutches (axillary or forearm) Cane (straight or quad) None = 0 = 1 = 2 = 3 = 4 = 5    2     Total Score                                AMPPRO      31 /47 AMPPRO       /47   K LEVEL (converted from AMP score)  AMPPRO    K1 = (15-26)      K2 = (27-36)     K3 =  (37-42)     K4 = (43-47)   RAMP  03/14/2023 - Evaluation with current prosthesis: Modified independence with RW & TFA prosthesis  CURB: 03/14/2023 - Evaluation with current prosthesis: Modified independence with RW & TFA prosthesis  CURRENT PROSTHETIC WEAR ASSESSMENT: 03/14/2023 - Evaluation with current prosthesis:  Patient is independent with: skin check, residual limb care, prosthetic cleaning, ply sock cleaning, correct ply sock adjustment, proper wear schedule/adjustment, and proper weight-bearing schedule/adjustment Donning prosthesis: Modified independence Doffing prosthesis: Modified independence Prosthetic wear tolerance: >90% of awake hours daily Prosthetic weight bearing tolerance: >10 minutes Residual limb condition: no open areas or issues per patient and wife POTENTIAL is K code/activity level with prosthetic use: Level 3   ASSESSMENT:  CLINICAL IMPRESSION: Patient is a 77 y.o. male who was seen today for physical therapy evaluation for potential to use a Microprocessor Knee Prosthesis. He appears to be functioning at basic community level with current prosthesis.  His prosthetic knee does not have hydraulics to enable ability to change pace, stumble recovery or slow knee flexion when appropriate with some standing / gait activities.  Patient appears would benefit from computer controlled microprocessor knee to improve function and decrease fall risk.  PT will reassess functional activities with a loaner microprocessor knee system that he has used for >3 days.  PT anticipates improvement in his functional tasks with MPK.    OBJECTIVE IMPAIRMENTS: Abnormal gait.   ACTIVITY LIMITATIONS: carrying, lifting, standing, stairs, and locomotion level  PARTICIPATION LIMITATIONS: community activity and church  PERSONAL FACTORS: 3+ comorbidities: see PMH  are also affecting patient's functional outcome.   REHAB POTENTIAL: Good  CLINICAL DECISION MAKING: Evolving/moderate  complexity  EVALUATION COMPLEXITY: Moderate   GOALS: Goals reviewed with patient? Yes  LONG TERM GOALS: Target date: 03/28/2023  Patient demonstrates & verbalized understanding of prosthetic recommendations. Baseline: SEE OBJECTIVE DATA Goal status: INITIAL   PLAN:  PT FREQUENCY: 1x/week  PT DURATION: 2 weeks  PLANNED INTERVENTIONS: 97164- PT Re-evaluation, 97110-Therapeutic exercises, 97530- Therapeutic activity, [id number]- Neuromuscular re-education, 97535- Self Care, [id number]- Gait training, and [phone]- Prosthetic training  PLAN FOR NEXT SESSION: patient to be fit with loaner Microprocessor Knee prosthesis for minimum of 3 days prior to PT re-evaluation.  Perform functional assessments above for comparison.   Date of referral: 12/06/2022 Referring provider: Marcello Fennel, MD Referring diagnosis? G40.102 (ICD-10-CM) - Acquired absence of left leg above knee  Treatment diagnosis? (if different than referring diagnosis)  unsteadiness on feet R26.81  Other abnormalities of gait & mobility R26.89  What was this (referring dx) caused by? Surgery (Type: left above knee amputation)  Nature of Condition: Chronic (continuous duration > 3 months)   Laterality: Lt  Current Functional Measure Score: Other Timed Up & Go with RW 38.94 sec; with cane 80.69 sec with minA  Objective measurements identify impairments when they are compared to normal values, the uninvolved extremity, and prior level of function.  [x]  Yes  []  No  Objective assessment of functional ability: Moderate functional limitations   Briefly describe symptoms: patient limited in community function by current prosthesis  How did symptoms start: vascular disease  Average pain intensity:  Last 24 hours: 0  Past week: 0  How often does the pt experience symptoms? Intermittently  How much have the symptoms interfered with usual daily activities? Moderately  How has condition changed since care began at this  facility? NA - initial visit  In general, how is the patients overall health? Good   BACK PAIN (STarT Back Screening Tool) No   Vladimir Faster, PT, DPT 03/14/2023, 2:54 PM

## 2023-03-20 ENCOUNTER — Encounter: Payer: 59 | Admitting: Physical Therapy

## 2023-03-21 ENCOUNTER — Encounter: Payer: 59 | Admitting: Physical Therapy

## 2023-03-28 ENCOUNTER — Ambulatory Visit (INDEPENDENT_AMBULATORY_CARE_PROVIDER_SITE_OTHER): Payer: 59 | Admitting: Physical Therapy

## 2023-03-28 ENCOUNTER — Encounter: Payer: Self-pay | Admitting: Physical Therapy

## 2023-03-28 DIAGNOSIS — R2689 Other abnormalities of gait and mobility: Secondary | ICD-10-CM

## 2023-03-28 DIAGNOSIS — R293 Abnormal posture: Secondary | ICD-10-CM

## 2023-03-28 DIAGNOSIS — R2681 Unsteadiness on feet: Secondary | ICD-10-CM | POA: Diagnosis not present

## 2023-03-28 DIAGNOSIS — M25652 Stiffness of left hip, not elsewhere classified: Secondary | ICD-10-CM

## 2023-03-28 DIAGNOSIS — M6281 Muscle weakness (generalized): Secondary | ICD-10-CM

## 2023-03-28 NOTE — Therapy (Signed)
OUTPATIENT PHYSICAL THERAPY PROSTHETIC TREATMENT & DISCHARGE SUMMARY   Patient Name: Samuel Carney MRN: [id number] DOB:[birth year], 77 y.o., male Today's Date: 03/28/2023  PHYSICAL THERAPY DISCHARGE SUMMARY  Visits from Start of Care: 2  Current functional level related to goals / functional outcomes: See below   Remaining deficits: See below   Education / Equipment: Patient and wife were educated in outcome differences between the 2 prostheses and PT recommendations for a microprocessor knee.  Both verbalized understanding.  Patient agrees to discharge. Patient goals were met. Patient is being discharged due to meeting the stated rehab goals.   PCP: Dorothey Baseman, MD  REFERRING PROVIDER: Marcello Fennel, MD  END OF SESSION:  PT End of Session - 03/28/23 1346     Visit Number 2    Number of Visits 2    Date for PT Re-Evaluation 03/28/23    Authorization Type [employer] Medicare    PT Start Time 1345    PT Stop Time 1429    PT Time Calculation (min) 44 min    Equipment Utilized During Treatment Gait belt    Activity Tolerance Patient tolerated treatment well    Behavior During Therapy WFL for tasks assessed/performed              Past Medical History:  Diagnosis Date   Alcohol abuse    Aortic atherosclerosis (HCC)    B12 deficiency    Cirrhosis (HCC)    COPD (chronic obstructive pulmonary disease) (HCC)    Coronary artery disease    GERD (gastroesophageal reflux disease)    Glaucoma    Grade II diastolic dysfunction    History of kidney stones    HLD (hyperlipidemia)    Hx of radiation therapy    Hypertension    Lung mass    Moderate mitral regurgitation    Pneumonia    PVD (peripheral vascular disease) (HCC)    Squamous cell carcinoma of lung, right (HCC) 2019   Past Surgical History:  Procedure Laterality Date   COLONOSCOPY WITH PROPOFOL     COLONOSCOPY WITH PROPOFOL N/A 11/25/2018   Procedure: COLONOSCOPY WITH PROPOFOL;  Surgeon: Christena Deem, MD;  Location: [facility] ENDOSCOPY;  Service: Endoscopy;  Laterality: N/A;   COLONOSCOPY WITH PROPOFOL N/A 11/29/2021   Procedure: COLONOSCOPY WITH PROPOFOL;  Surgeon: Toledo, Boykin Nearing, MD;  Location: [facility] ENDOSCOPY;  Service: Gastroenterology;  Laterality: N/A;   ENDARTERECTOMY FEMORAL Left 07/13/2020   Procedure: ENDARTERECTOMY FEMORAL ( SFA STENT);  Surgeon: Renford Dills, MD;  Location: [facility] ORS;  Service: Vascular;  Laterality: Left;   ESOPHAGOGASTRODUODENOSCOPY (EGD) WITH PROPOFOL N/A 11/25/2018   Procedure: ESOPHAGOGASTRODUODENOSCOPY (EGD) WITH PROPOFOL;  Surgeon: Christena Deem, MD;  Location: [facility] ENDOSCOPY;  Service: Endoscopy;  Laterality: N/A;   ESOPHAGOGASTRODUODENOSCOPY (EGD) WITH PROPOFOL N/A 11/29/2021   Procedure: ESOPHAGOGASTRODUODENOSCOPY (EGD) WITH PROPOFOL;  Surgeon: Toledo, Boykin Nearing, MD;  Location: [facility] ENDOSCOPY;  Service: Gastroenterology;  Laterality: N/A;   ESOPHAGOGASTRODUODENOSCOPY (EGD) WITH PROPOFOL N/A 12/08/2021   Procedure: ESOPHAGOGASTRODUODENOSCOPY (EGD) WITH PROPOFOL;  Surgeon: Wyline Mood, MD;  Location: [facility] ENDOSCOPY;  Service: Gastroenterology;  Laterality: N/A;   LEG AMPUTATION ABOVE KNEE Left    LOWER EXTREMITY ANGIOGRAPHY Left 06/07/2020   Procedure: LOWER EXTREMITY ANGIOGRAPHY;  Surgeon: Renford Dills, MD;  Location: [facility] INVASIVE CV LAB;  Service: Cardiovascular;  Laterality: Left;   LOWER EXTREMITY ANGIOGRAPHY Left 01/10/2021   Procedure: LOWER EXTREMITY ANGIOGRAPHY;  Surgeon: Renford Dills, MD;  Location: [facility] INVASIVE CV LAB;  Service:  Cardiovascular;  Laterality: Left;   Patient Active Problem List   Diagnosis Date Noted   UGIB (upper gastrointestinal bleed)    Malnutrition of moderate degree 12/06/2021   Melena 12/05/2021   COPD (chronic obstructive pulmonary disease) (HCC)    Grade II diastolic dysfunction    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), left (HCC)    AKI (acute kidney injury) (HCC)    Atherosclerosis of  artery of extremity with ulceration (HCC) 07/13/2020   Atherosclerosis of native arteries of the extremities with ulceration (HCC) 05/19/2020   Alcoholic cirrhosis of 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) 04/09/2019   Hyperlipidemia, mixed 04/09/2019   Malignant 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) 04/09/2019   Thrombocytopenia (HCC) 04/09/2019   CAD (coronary artery disease) 10/14/2017   Lung cancer (HCC) 10/14/2017   Tobacco abuse 08/14/2017   Benign essential hypertension 09/09/2013    ONSET DATE: 03/06/2023 MD referral to PT for prosthetic prescription  REFERRING DIAG: 89.612 (ICD-10-CM) - Acquired absence of left leg above knee   THERAPY DIAG:  Unsteadiness on feet  Other abnormalities of gait and mobility  Muscle weakness (generalized)  Abnormal posture  Stiffness of left hip, not elsewhere classified  Rationale for Evaluation and Treatment: Rehabilitation  SUBJECTIVE:   SUBJECTIVE STATEMENT: 03/28/2023 with loaner Microprocessor Knee Prosthesis: Prosthetist put loaner MPK on 03/31/2023.  He feels more confident with this knee.  He feels like this prosthesis is lighter than other prosthesis.  He has improved daily with confidence in this knee.  No falls.   03/14/2023 Evaluation: This 77yo male underwent a 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 He got his first prosthesis on 05/29/2021 and socket revision with new suspension type on prosthesis on 02/16/2022. He got new socket 02/14/2023 due to shrinkage of limb.  He wants a computerized prosthesis to help with his mobility.  Pt accompanied by: significant other and family member  PERTINENT HISTORY: PAD, CAD, angina, malignant neoplasm of right lung, HLD, alcoholic cirrhosis of liver, HTN, COPD   PAIN:  Are you having pain? No  PRECAUTIONS: Fall  WEIGHT BEARING RESTRICTIONS: No  FALLS: Has patient fallen in last 6 months? No  LIVING ENVIRONMENT: Lives with: lives with their spouse Lives  in: House Home Access: Stairs to enter and Ramped entrance Home layout: One level and single step into washer / dryer Stairs: Yes: Internal: 1 steps; none and External: 2 steps; on left going up Has following equipment at home: Single point cane, Walker - 2 wheeled, Crutches, Wheelchair (manual), shower chair, and Ramped entry  OCCUPATION: Retired  PLOF:  walks independently with prosthesis and RW in community.  He uses cane in home.   PATIENT GOALS: to get a microprocessor prosthesis to improve his mobility in home and community  OBJECTIVE:  COGNITION: Overall cognitive status: Within functional limits for tasks assessed   SENSATION: WFL  POSTURE: rounded shoulders, forward head, and flexed trunk   CARDIOVASCULAR RESPONSE: 03/28/2023 with loaner Microprocessor Knee Prosthesis:  Functional activity: gait & balance assessments Pre-activity vitals: HR: 83 SpO2: 98% Post-activity vitals: HR: 100 SpO2: 98% Modified Borg scale for dyspnea: 2: mild shortness of breath  03/14/2023 - Evaluation with current prosthesis: Functional activity: gait & balance assessments Pre-activity vitals: HR: 86 SpO2: 97% Post-activity vitals: HR: 105 SpO2: 98% Modified Borg scale for dyspnea: 2: mild shortness of breath  TRANSFERS: 03/28/2023 with loaner Microprocessor Knee Prosthesis:  Sit to stand: Modified independence requires single UE assist on chair seat (not  armrest) and stabilizes without external support.  Stand to sit: Modified independence without UE support to control descent.   03/14/2023 - Evaluation with current prosthesis: Sit to stand: Modified independence requires UE assist on chair armrest but stabilizes without external support.  Stand to sit: Modified independence BUE on chair to control descent.   FUNCTIONAL TESTs:  03/28/2023 with loaner Microprocessor Knee Prosthesis:  Timed Up & Go with RW 19.56 sec; with cane 47.62 sec with close supervision.   03/14/2023 -  Evaluation with current prosthesis: Timed Up & Go with RW 38.94 sec; with cane 80.69 sec with minA.   GAIT: 03/28/2023 with loaner Microprocessor Knee Prosthesis: Gait pattern: step to pattern, decreased arm swing- Left, decreased step length- Right, decreased stance time- Left, antalgic, and trunk flexed Distance walked: >500' with RW & 150' with cane Assistive device utilized: Single point cane, Walker - 2 wheeled, and TFA prosthesis Level of assistance: Modified independence Gait velocity: RW support self-selected comfortable pace 1.62 ft/sec; fast pace 2.22 ft/sec;  with cane self-selected comfortable pace  0.84 ft/sec   03/14/2023 - Evaluation with current prosthesis: Gait pattern: step to pattern, decreased arm swing- Left, decreased step length- Right, decreased stance time- Left, antalgic, and trunk flexed Distance walked: >300' with RW & 100' with cane Assistive device utilized: Single point cane, Walker - 2 wheeled, and TFA prosthesis Level of assistance: Modified independence Gait velocity: RW support self-selected comfortable pace 1.54 ft/sec; fast pace 1.70  ft/sec;  with cane self-selected comfortable pace  0.57 ft/sec   AMPUTEE MOBILITY PREDICTOR ASSESSMENT TOOL Initial instructions: Client is seated in a hard chair with arms. The following manoeuvres are tested with or without the use of the prosthesis.  Advise the person of each task or group of tasks prior to performance.  Please avoid unnecessary chatter throughout the test.  Safety First, no task should be performed if either the tester or client is uncertain of a safe outcome.  TASK SCORING GUIDELINES SCORE OPTIONS EVALUATION WITH CURRENT PROSTHESIS 03/14/2023 EVALUATION WITH MICROPROSCESSOR PROSTHESIS 03/28/2023  1. Sitting Balance: Sit forward in a chair with arms folded across chest for 60s. Cannot sit upright independently for 60s Can sit upright independently for 60s = 0 = 1  1 1   2. Sitting reach:  Reach  forwards and grasp the ruler.  (Tester holds ruler 12in beyond extended arms midline to the sternum) Does not attempt Cannot grasp or requires arm support Reaches forward and successfully grasps item.  = 0 = 1  = 2    2 2   3. Chair to chair transfer: 2 chairs at 90. Pt. may choose direction and use their upper limbs. Cannot do or requires physical assistance Performs independently, but appears unsteady Performs independently, appears to be steady and safe = 0  = 1 = 2  2 2   4. Arises from a chair: Ask pt. to fold arms across chest and stand. If unable, use arms or assistive device. Unable without help (physical assistance) Able, uses arms/assist device to help Able, without using arms = 0  = 1 = 2   1 1   5. Attempts to arise from a chair: (stopwatch ready) If attempt in no. 4. was without arms then ignore and allow another attempt without penalty. Unable without help (physical assistance) Able requires >1 attempt Able to rise one attempt = 0  = 1 = 2   2 2   6. Immediate Standing Balance: (first 5s) Begin timing immediately. Unsteady (staggers, moves foot, sways )  Steady using walking aid or other support Steady without walker or other support = 0 = 1  = 2  2 2   7. Standing Balance (30s): (stopwatch ready) For item no.'s 7 & 8, first attempt is without assistive device.  If support is required allow after first attempt Unsteady Steady but uses walking aid or other support Standing without support = 0  = 1 = 2    2 2   8. Single limb standing balance: (stopwatch ready) Time the duration of single limb standing on both the sound and prosthetic limb up to 30s.   Non-prosthetic side Unsteady Steady but uses walking aid or other support for 30s Single-limb standing without support for 30s  Prosthetic Side Unsteady Steady but uses walking aid or other support for 30s Single-limb standing without support for 30s  = 0 = 1  = 2    = 0  = 1  = 2     1     1 1    1   9. Standing reach: Reach forward and grasp the ruler.  (Tester holds ruler 12in beyond extended arm(s) midline to the sternum) Does not attempt Cannot grasp or requires arm support on assistive device Reaches forward and successfully grasps item no support = 0  = 1  = 2   1 2   10. Nudge test: With feet as close together as possible, examiner pushes lightly on pt.'s sternum with palm of hand 3 times (toes should rise) Begins to fall Staggers, grabs, catches self ore uses assistive device Steady = 0  = 1 = 2   2 2   11. Eyes Closed: (at maximum position #7) If support is required grade as unsteady. Unsteady or grips assistive device Steady without any use of assistive device = 0  = 1  1  1    12. Pick up objects off the floor: Pick up a pencil off the floor placed midline 12in in front of foot. Unable to pick up object and return to standing Performs with some help (table, chair, walking aid etc) Performs independently (without help) = 0  = 1   = 2  1 1   13. Sitting down:  Ask pt. to fold arms across chest and sit. If unable, use arm or assistive device. Unsafe (misjudged distance, falls into chair ) Uses arms, assistive device or not a smooth motion Safe, smooth motion = 0  = 1 = 2   1 2   14. Initiation of gait: (immediately after told to "go") Any hesitancy or multiple attempts to start No hesitancy = 0 = 1  1 1   15. Step length and height: Walk a measured distance of 13ft twice (up and back). Four scores are required or two scores (a. & b.) for each leg. "Marked deviation" is defined as extreme substitute movements to avoid clearing the floor. a. Swing Foot Does not advance a minimum of 12in Advances a minimum of 12in  b. Foot Clearance Foot does not completely clear floor without deviation Foot completely clears floor without marked deviation  = 0  = 1   = 0 = 1 Prosthesis  1   1 Sound  1   1  Prosthesis 1   1  Sound  1    1       16. Step Continuity Stopping or discontinuity between steps (stop & go gait) Steps appear continuous = 0  = 1  0 1  17. Turning:  180 degree turn when returning  to chair. Unable to turn, requires intervention to prevent falling Greater than three steps but completes task without intervention No more than three continuous steps with or without assistive aid = 0  = 1  = 2    1 2   18. Variable cadence:  Walk a distance of 23ft fast as possible safely 4 times.  (Speeds may vary from slow to fast and fast to slow varying cadence) Unable to vary cadence in a controlled manner Asymmetrical increase in cadence controlled manner Symmetrical increase in speed in a controlled manner  = 0 = 1 = 2      0 1  19. Stepping over an obstacle: Place a movable box of 4in in height in the walking path. Cannot step over the box Catches foot, interrupts stride Steps over without interrupting stride = 0 = 1 = 2   1 1   20. Stairs (must have at least 2 steps):  Try to go up and down these stairs without holding on to the railing.  Don't hesitate to permit pt. to hold on to rail.  Safety First, if examiner feels that any risk in involved omit and score as 0.  Ascending Unsteady, cannot do One step at a time, or must hold on to railing or device Step over step, does not hold onto the railing or device  Descending Unsteady, cannot do One step at a time, or must hold on to railing or device Step over step, does not hold onto the railing or device  = 0  = 1 = 2    = 0  = 1 = 2   1     1 1     1   21. Assistive device selection:  Add points for the use of an assistive device if used for two or more items.  If testing without prosthesis use of appropriate assistive device is mandatory.   Bed bound Wheelchair / Parallel Bars Walker Crutches (axillary or forearm) Cane (straight or quad) None = 0 = 1 = 2 = 3 = 4 = 5     2 4     Total Score                                AMPPRO      31 /47 AMPPRO      37 /47   K LEVEL (converted from AMP score)  AMPPRO    K1 = (15-26)      K2 = (27-36)     K3 = (37-42)     K4 = (43-47)   RAMP  03/28/2023 with loaner Microprocessor Knee Prosthesis: Modified independence with RW & TFA prosthesis;    minA with cane & MPK prosthesis.  03/14/2023 - Evaluation with current prosthesis: Modified independence with RW & TFA prosthesis;  CURB:  03/28/2023 with loaner Microprocessor Knee Prosthesis: Modified independence with RW & TFA prosthesis;   minA with cane & MPK prosthesis.  03/14/2023 - Evaluation with current prosthesis: Modified independence with RW & TFA prosthesis  CURRENT PROSTHETIC WEAR ASSESSMENT: 03/28/2023 with loaner Microprocessor Knee Prosthesis: worn prosthesis daily up to 90% of awake hours.  03/14/2023 - Evaluation with current prosthesis:  Patient is independent with: skin check, residual limb care, prosthetic cleaning, ply sock cleaning, correct ply sock adjustment, proper wear schedule/adjustment, and proper weight-bearing schedule/adjustment Donning prosthesis: Modified independence Doffing prosthesis: Modified independence Prosthetic wear tolerance: >90% of awake hours daily Prosthetic  weight bearing tolerance: >10 minutes Residual limb condition: no open areas or issues per patient and wife POTENTIAL is K code/activity level with prosthetic use: Level 3   ASSESSMENT:  CLINICAL IMPRESSION: 03/28/2023 with loaner microprocessor knee prosthesis: Patient has had a loaner Microprocessor Knee for 1 week with no training. He made significant improvements in all functional tests and PT would expect significantly more improvements with training ~1x/wk with a Microprocessor Knee.  With his current prosthesis, he is walker dependent as he perceives that the prosthetic knee will buckle.  With a microprocessor knee patient was able to utilize the cane with  less assistance and fear of falling.  Patient improved Timed Up and Go Test, AmpPro Test, and Gait Velocity significantly with the microprocessor knee prosthesis compared to his K2 prosthesis.  Patient was actually able to increase his walking speed significantly from comfortable self-selected to fast pace.  This indicates that with his knee he be able to actually cross the street or have a more functional gait in the community.  Patient reported less fear of falling with microprocessor knee.  Patient and wife are in agreement with PT that he would benefit from a microprocessor knee.   Evaluation on 03/14/2023:   Patient is a 77 y.o. male who was seen today for physical therapy evaluation for potential to use a Microprocessor Knee Prosthesis. He appears to be functioning at basic community level with current prosthesis.  His prosthetic knee does not have hydraulics to enable ability to change pace, stumble recovery or slow knee flexion when appropriate with some standing / gait activities.  Patient appears would benefit from computer controlled microprocessor knee to improve function and decrease fall risk.  PT will reassess functional activities with a loaner microprocessor knee system that he has used for >3 days.  PT anticipates improvement in his functional tasks with MPK.    OBJECTIVE IMPAIRMENTS: Abnormal gait.   ACTIVITY LIMITATIONS: carrying, lifting, standing, stairs, and locomotion level  PARTICIPATION LIMITATIONS: community activity and church  PERSONAL FACTORS: 3+ comorbidities: see PMH  are also affecting patient's functional outcome.   REHAB POTENTIAL: Good  CLINICAL DECISION MAKING: Evolving/moderate complexity  EVALUATION COMPLEXITY: Moderate   GOALS: Goals reviewed with patient? Yes  LONG TERM GOALS: Target date: 03/28/2023  Patient demonstrates & verbalized understanding of prosthetic recommendations. Baseline: SEE OBJECTIVE DATA Goal status: MET  03/28/2023   PLAN:  PT FREQUENCY: 1x/week  PT DURATION: 2 weeks  PLANNED INTERVENTIONS: 97164- PT Re-evaluation, 97110-Therapeutic exercises, 97530- Therapeutic activity, [id number]- Neuromuscular re-education, 97535- Self Care, [id number]- Gait training, and [phone]- Prosthetic training  PLAN FOR NEXT SESSION:  discharge PT.   PT is recommending a microprocessor knee prosthesis for this patient to improve community mobility that does not require use of a rolling walker and improve safety with lower fall risk.    Vladimir Faster, PT, DPT 03/28/2023, 4:40 PM

## 2023-05-28 ENCOUNTER — Encounter: Payer: 59 | Admitting: Physical Therapy

## 2023-05-30 ENCOUNTER — Encounter: Payer: Self-pay | Admitting: Physical Therapy

## 2023-05-30 ENCOUNTER — Ambulatory Visit (INDEPENDENT_AMBULATORY_CARE_PROVIDER_SITE_OTHER): Payer: 59 | Admitting: Physical Therapy

## 2023-05-30 ENCOUNTER — Other Ambulatory Visit: Payer: Self-pay

## 2023-05-30 DIAGNOSIS — M25652 Stiffness of left hip, not elsewhere classified: Secondary | ICD-10-CM

## 2023-05-30 DIAGNOSIS — R2689 Other abnormalities of gait and mobility: Secondary | ICD-10-CM

## 2023-05-30 DIAGNOSIS — M6281 Muscle weakness (generalized): Secondary | ICD-10-CM | POA: Diagnosis not present

## 2023-05-30 DIAGNOSIS — R293 Abnormal posture: Secondary | ICD-10-CM

## 2023-05-30 DIAGNOSIS — R2681 Unsteadiness on feet: Secondary | ICD-10-CM

## 2023-05-30 NOTE — Therapy (Signed)
 OUTPATIENT PHYSICAL THERAPY PROSTHETIC EVALUATION   Patient Name: Samuel Carney MRN: [id number] DOB:1945/10/12, 78 y.o., male Today's Date: 05/30/2023  END OF SESSION:  PT End of Session - 05/30/23 1011     Visit Number 1    Number of Visits 14    Date for PT Re-Evaluation 08/28/23    Authorization Type [employer] Medicare dual complete    Progress Note Due on Visit 10    PT Start Time 0930    PT Stop Time 1014    PT Time Calculation (min) 44 min    Equipment Utilized During Treatment Gait belt    Activity Tolerance Patient tolerated treatment well    Behavior During Therapy WFL for tasks assessed/performed             Past Medical History:  Diagnosis Date   Alcohol abuse    Aortic atherosclerosis (HCC)    B12 deficiency    Cirrhosis (HCC)    COPD (chronic obstructive pulmonary disease) (HCC)    Coronary artery disease    GERD (gastroesophageal reflux disease)    Glaucoma    Grade II diastolic dysfunction    History of kidney stones    HLD (hyperlipidemia)    Hx of radiation therapy    Hypertension    Lung mass    Moderate mitral regurgitation    Pneumonia    PVD (peripheral vascular disease) (HCC)    Squamous cell carcinoma of lung, right (HCC) 2019   Past Surgical History:  Procedure Laterality Date   COLONOSCOPY WITH PROPOFOL     COLONOSCOPY WITH PROPOFOL N/A 11/25/2018   Procedure: COLONOSCOPY WITH PROPOFOL;  Surgeon: Christena Deem, MD;  Location: [facility] ENDOSCOPY;  Service: Endoscopy;  Laterality: N/A;   COLONOSCOPY WITH PROPOFOL N/A 11/29/2021   Procedure: COLONOSCOPY WITH PROPOFOL;  Surgeon: Toledo, Boykin Nearing, MD;  Location: [facility] ENDOSCOPY;  Service: Gastroenterology;  Laterality: N/A;   ENDARTERECTOMY FEMORAL Left 07/13/2020   Procedure: ENDARTERECTOMY FEMORAL ( SFA STENT);  Surgeon: Renford Dills, MD;  Location: [facility] ORS;  Service: Vascular;  Laterality: Left;   ESOPHAGOGASTRODUODENOSCOPY (EGD) WITH PROPOFOL N/A 11/25/2018   Procedure:  ESOPHAGOGASTRODUODENOSCOPY (EGD) WITH PROPOFOL;  Surgeon: Christena Deem, MD;  Location: [facility] ENDOSCOPY;  Service: Endoscopy;  Laterality: N/A;   ESOPHAGOGASTRODUODENOSCOPY (EGD) WITH PROPOFOL N/A 11/29/2021   Procedure: ESOPHAGOGASTRODUODENOSCOPY (EGD) WITH PROPOFOL;  Surgeon: Toledo, Boykin Nearing, MD;  Location: [facility] ENDOSCOPY;  Service: Gastroenterology;  Laterality: N/A;   ESOPHAGOGASTRODUODENOSCOPY (EGD) WITH PROPOFOL N/A 12/08/2021   Procedure: ESOPHAGOGASTRODUODENOSCOPY (EGD) WITH PROPOFOL;  Surgeon: Wyline Mood, MD;  Location: [facility] ENDOSCOPY;  Service: Gastroenterology;  Laterality: N/A;   LEG AMPUTATION ABOVE KNEE Left    LOWER EXTREMITY ANGIOGRAPHY Left 06/07/2020   Procedure: LOWER EXTREMITY ANGIOGRAPHY;  Surgeon: Renford Dills, MD;  Location: [facility] INVASIVE CV LAB;  Service: Cardiovascular;  Laterality: Left;   LOWER EXTREMITY ANGIOGRAPHY Left 01/10/2021   Procedure: LOWER EXTREMITY ANGIOGRAPHY;  Surgeon: Renford Dills, MD;  Location: [facility] INVASIVE CV LAB;  Service: Cardiovascular;  Laterality: Left;   Patient Active Problem List   Diagnosis Date Noted   UGIB (upper gastrointestinal bleed)    Malnutrition of moderate degree 12/06/2021   Melena 12/05/2021   COPD (chronic obstructive pulmonary disease) (HCC)    Grade II diastolic dysfunction    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), left (HCC)    AKI (acute kidney injury) (HCC)    Atherosclerosis of artery of extremity with ulceration (HCC) 07/13/2020   Atherosclerosis  of native arteries of the extremities with ulceration (HCC) 05/19/2020   Alcoholic cirrhosis of 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) 04/09/2019   Hyperlipidemia, mixed 04/09/2019   Malignant 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) 04/09/2019   Thrombocytopenia (HCC) 04/09/2019   CAD (coronary artery disease) 10/14/2017   Lung cancer (HCC) 10/14/2017   Tobacco abuse 08/14/2017   Benign essential hypertension 09/09/2013    PCP: Dorothey Baseman,  MD  REFERRING PROVIDER: Marcello Fennel, MD  ONSET DATE: 05/14/2023 received new Microprocessor Knee  REFERRING DIAG: Acquired absence of left leg above knee V49.76 & Z89.612  THERAPY DIAG:  Other abnormalities of gait and mobility  Unsteadiness on feet  Muscle weakness (generalized)  Stiffness of left hip, not elsewhere classified  Abnormal posture  Rationale for Evaluation and Treatment: Rehabilitation  SUBJECTIVE:   SUBJECTIVE STATEMENT: Patient received new Microprocessor Knee Prosthesis on 05/14/2023 and presents to PT to learn to use it to maximize his function. He wears prosthesis ~2 hours after awakening until just before bedtime.  He reports no skin issues.   Pt accompanied by: significant other  PERTINENT HISTORY: PAD, CAD, angina, malignant neoplasm of right lung, HLD, alcoholic cirrhosis of liver, HTN, COPD   PAIN:  Are you having pain? No  PRECAUTIONS: None  WEIGHT BEARING RESTRICTIONS: No  FALLS: Has patient fallen in last 6 months? No  LIVING ENVIRONMENT: Lives with: lives with their spouse Lives in: House Home Access: Stairs to enter and Ramped entrance Home layout: One level and single step into washer / dryer Stairs: Yes: Internal: 1 steps; none and External: 2 steps; on left going up Has following equipment at home: Single point cane, Walker - 2 wheeled, Crutches, Wheelchair (manual), shower chair, and Ramped entry  OCCUPATION: retired  PLOF: walks independently with prosthesis and RW in community. He uses cane in home.   PATIENT GOALS:  to learn to use new prosthesis to walk in home and community with cane.   OBJECTIVE:  Patient-Specific Activity Scoring Scheme  "0" represents "unable to perform." "10" represents "able to perform at prior level. 0 1 2 3 4 5 6 7 8 9  10 (Date and Score)   Activity Eval     1. Walking in house with cane  1    2. standing balance with ADLs  2    3. stairs 2   4.    5.    Score 1.67    Total score =  sum of the activity scores/number of activities Minimum detectable change (90%CI) for average score = 2 points Minimum detectable change (90%CI) for single activity score = 3 points     COGNITION: Evaluation on 05/30/2023: Overall cognitive status: Within functional limits for tasks assessed  CARDIOVASCULAR RESPONSE: Evaluation on  05/30/2023: Functional activity: gait Pre-activity vitals: HR: 65 SpO2: 97% Post-activity vitals: HR: 86 SpO2: 96% Modified Borg scale for dyspnea: 2: mild shortness of breath  POSTURE: Evaluation on 05/30/2023: weight shift right  LOWER EXTREMITY ROM:  ROM P:passive  A:active Right eval Left eval  Hip flexion    Hip extension  Standing -10*  Hip abduction    Hip adduction    Hip internal rotation    Hip external rotation    Knee flexion    Knee extension    Ankle dorsiflexion    Ankle plantarflexion    Ankle inversion    Ankle eversion     (Blank rows = not tested)  LOWER EXTREMITY MMT:  MMT Right eval Left  eval  Hip flexion    Hip extension    Hip abduction    Hip adduction    Hip internal rotation    Hip external rotation    Knee flexion    Knee extension    Ankle dorsiflexion    Ankle plantarflexion    Ankle inversion    Ankle eversion    At Evaluation all strength testing is grossly seated and functionally standing / gait. (Blank rows = not tested)  TRANSFERS: Evaluation on 05/30/2023: Sit to stand: SBA (cues only) using BUEs on seat of 18" chair and stabilizes without UE support. Minor engagement of MPK prosthesis.  Stand to sit: SBA (cues only) using BUEs on seat of 18" chair and stabilizes without UE support. Minor engagement of MPK prosthesis.   FUNCTIONAL TESTs:  Evaluation on 05/30/2023: Berg Balance Scale: 32/56  [facility] PT Assessment - 05/30/23 0930       Standardized Balance Assessment   Standardized Balance Assessment Berg Balance Test      Berg Balance Test   Sit to Stand Able to stand  independently  using hands    Standing Unsupported Able to stand 2 minutes with supervision    Sitting with Back Unsupported but Feet Supported on Floor or Stool Able to sit safely and securely 2 minutes    Stand to Sit Controls descent by using hands    Transfers Able to transfer safely, definite need of hands    Standing Unsupported with Eyes Closed Able to stand 10 seconds with supervision    Standing Unsupported with Feet Together Able to place feet together independently but unable to hold for 30 seconds    From Standing, Reach Forward with Outstretched Arm Can reach forward >5 cm safely (2")    From Standing Position, Pick up Object from Floor Able to pick up shoe, needs supervision    From Standing Position, Turn to Look Behind Over each Shoulder Looks behind one side only/other side shows less weight shift    Turn 360 Degrees Needs assistance while turning    Standing Unsupported, Alternately Place Feet on Step/Stool Needs assistance to keep from falling or unable to try    Standing Unsupported, One Foot in Front Able to take small step independently and hold 30 seconds    Standing on One Leg Tries to lift leg/unable to hold 3 seconds but remains standing independently    Total Score 32    Berg comment: < 36 high risk for falls (close to 100%) 46-51 moderate (>50%)   37-45 significant (>80%) 52-55 lower (> 25%)            GAIT: Evaluation on 05/30/2023: Gait pattern: step through pattern, decreased stance time- Left, decreased hip/knee flexion- Left, antalgic, and trunk flexed Distance walked: 150' Assistive device utilized: with TFA MPK prosthesis Single point cane (minA), Walker - 2 wheeled (modified independent / cues for deviations) Level of assistance: Modified independence, SBA, and Min A Gait velocity: with RW comfortable / self-selected 1.03 ft/sec & fast pace 1.96 ft/sec;  with cane & minA comfortable / self-selected 0.67 ft/sec & fast pace 0.69 ft/sec;  CURRENT PROSTHETIC WEAR  ASSESSMENT: Evaluation on 05/30/2023: Patient is independent with: skin check, residual limb care, care of non-amputated limb, prosthetic cleaning, ply sock cleaning, and correct ply sock adjustment Patient is dependent with: proper wear schedule/adjustment Donning prosthesis: SBA / verbal cues Doffing prosthesis: Modified independence Prosthetic wear tolerance: 10-12 hours/day, 7 days/week Prosthetic weight bearing tolerance: 10 minutes Residual limb  condition: pt denies any issues.  Prosthetic description: silicon liner with suction ring suspension, Microprocessor Knee (MPK), dynamic response foot, Total Contact Socket with flexible inner socket K code/activity level with prosthetic use: Level 3    TODAY'S TREATMENT:                                                                                                                             DATE:  Evaluation on 05/30/2023: Prosthetic Care with Transfemoral Microprocessor Knee (MPK) prosthesis: See patient education below. PT demo & verbal cue on how to engage prosthesis with sit to/from stand. Pt able to return demo & verbalized understanding.   PATIENT EDUCATION: PATIENT EDUCATED ON FOLLOWING PROSTHETIC CARE: Education details:   Proper doffing with proper rotation Person educated: Patient and Spouse Education method: Explanation, Demonstration, Tactile cues, and Verbal cues Education comprehension: verbalized understanding and needs further education  HOME EXERCISE PROGRAM:  ASSESSMENT:  CLINICAL IMPRESSION: Patient is a 78 y.o. male who was seen today for physical therapy evaluation and treatment for prosthetic training with left Transfemoral Microprocessor Knee Prosthesis. He is dependent in use of Microprocessor knee unit on prosthesis and controlling rotation with donning.  Berg Balance score 32/56 indicates high fall risk. Patient's gait is currently dependent on RW for BUE support and he has potential to ambulate with cane &  prosthesis.  He has gait deviations indicating fall risk.  Patient would benefit from skilled PT to maximize function and safety with his MPK prosthesis.    OBJECTIVE IMPAIRMENTS: Abnormal gait, decreased activity tolerance, decreased balance, decreased endurance, decreased knowledge of condition, decreased knowledge of use of DME, decreased mobility, difficulty walking, decreased ROM, decreased strength, and prosthetic dependency .   ACTIVITY LIMITATIONS: carrying, lifting, standing, stairs, transfers, and locomotion level  PARTICIPATION LIMITATIONS: meal prep, community activity, church, and household mobility  PERSONAL FACTORS: Age, Fitness, Past/current experiences, Time since onset of injury/illness/exacerbation, and 3+ comorbidities: see PMH  are also affecting patient's functional outcome.   REHAB POTENTIAL: Good  CLINICAL DECISION MAKING: Evolving/moderate complexity  EVALUATION COMPLEXITY: Moderate   GOALS: Goals reviewed with patient? Yes  SHORT TERM GOALS: Target date: 06/27/2023  Patient reports able to don prosthesis with proper rotation modified independent. Baseline: SEE OBJECTIVE DATA Goal status: INITIAL 2.  Patient able to sit to/from stand engaging hydraulics of prosthetic knee from 18" chair without armrests using BUEs.  Baseline: SEE OBJECTIVE DATA Goal status: INITIAL  3.  Patient able to pick up object from floor without UE support using prosthetic hydraulics with supervision. Baseline: SEE OBJECTIVE DATA Goal status: INITIAL  4. Patient ambulates 100' with cane & prosthesis with minA. Baseline: SEE OBJECTIVE DATA Goal status: INITIAL   LONG TERM GOALS: Target date: 08/28/2023  Patient demonstrates & verbalized understanding of prosthetic care to enable safe utilization of prosthesis. Baseline: SEE OBJECTIVE DATA Goal status: INITIAL  Patient-Specific Activity Scoring >/= 4/10 Baseline: SEE OBJECTIVE DATA Goal status: INITIAL  Berg Balance >/=  40/56 (increase by >8 points) to indicate lower fall risk Baseline: SEE OBJECTIVE DATA Goal status: INITIAL  Patient ambulates 100' with prosthesis and cane independently Baseline: SEE OBJECTIVE DATA Goal status: INITIAL  Patient negotiates ramps & curbs with cane & prosthesis with supervision. Baseline: SEE OBJECTIVE DATA Goal status: INITIAL  Patient negotiates stairs with single rail & prosthesis with supervision.  Baseline: SEE OBJECTIVE DATA Goal status: INITIAL   PLAN:  PT FREQUENCY: 1x/week  PT DURATION: 90 days  PLANNED INTERVENTIONS: 97164- PT Re-evaluation, 97110-Therapeutic exercises, 97530- Therapeutic activity, 97112- Neuromuscular re-education, [phone]- Self Care, 60454- Gait training, [phone]- Prosthetic training, Patient/Family education, Balance training, and Stair training  PLAN FOR NEXT SESSION: check sit to/from stand and progress to picking up light objects,  walking with cane near counter, step down "riding" hydraulics on 4" step   Vladimir Faster, PT, DPT 05/30/2023, 5:07 PM   Date of referral: 05/17/2023 Referring provider: Marcello Fennel, MD Referring diagnosis? Acquired absence of left leg above knee V49.76 & Z89.612 Treatment diagnosis? (if different than referring diagnosis)  Other abnormalities of gait and mobility ICD-10-CM:  R26.89  Unsteadiness on feet ICD-10-CM:  R26.81  Muscle weakness (generalized) ICD-10-CM:  M62.81  Stiffness of left hip, not elsewhere classified M25.652  Abnormal posture R29.3  What was this (referring dx) caused by? Surgery (Type: AKA)  Nature of Condition: Initial Onset (within last 3 months)  reason for referral delivery of new prosthesis with significant change in design & function within last 3 months   Laterality: Lt  Current Functional Measure Score: Gait Velocity & assist with cane & prosthesis - minA comfortable / self-selected 0.67 ft/sec & fast pace 0.69 ft/sec;  Objective measurements identify  impairments when they are compared to normal values, the uninvolved extremity, and prior level of function.  [x]  Yes  []  No  Objective assessment of functional ability: Moderate functional limitations   Briefly describe symptoms: patient is dependent in gait & balance with new Microprocessor Knee prosthesis  How did symptoms start: amputation due to vascular issues  Average pain intensity:  Last 24 hours: 0  Past week: 0  How often does the pt experience symptoms? Frequently  How much have the symptoms interfered with usual daily activities? Moderately  How has condition changed since care began at this facility? NA - initial visit  In general, how is the patients overall health? Good   BACK PAIN (STarT Back Screening Tool) No

## 2023-06-11 ENCOUNTER — Encounter: Payer: Self-pay | Admitting: Physical Therapy

## 2023-06-11 ENCOUNTER — Ambulatory Visit (INDEPENDENT_AMBULATORY_CARE_PROVIDER_SITE_OTHER): Payer: 59 | Admitting: Physical Therapy

## 2023-06-11 DIAGNOSIS — R2681 Unsteadiness on feet: Secondary | ICD-10-CM | POA: Diagnosis not present

## 2023-06-11 DIAGNOSIS — R2689 Other abnormalities of gait and mobility: Secondary | ICD-10-CM | POA: Diagnosis not present

## 2023-06-11 DIAGNOSIS — M25652 Stiffness of left hip, not elsewhere classified: Secondary | ICD-10-CM | POA: Diagnosis not present

## 2023-06-11 DIAGNOSIS — R293 Abnormal posture: Secondary | ICD-10-CM

## 2023-06-11 DIAGNOSIS — M6281 Muscle weakness (generalized): Secondary | ICD-10-CM | POA: Diagnosis not present

## 2023-06-11 NOTE — Therapy (Signed)
 OUTPATIENT PHYSICAL THERAPY PROSTHETIC TREATMENT   Patient Name: Samuel Carney MRN: [id number] DOB:[birth year], 78 y.o., male Today's Date: 06/11/2023  END OF SESSION:  PT End of Session - 06/11/23 1019     Visit Number 2    Number of Visits 14    Date for PT Re-Evaluation 08/28/23    Authorization Type [employer] Medicare dual complete    Authorization Time Period NO AUTH NEEDED, NO COPAY, 10% COINSURANCE    Progress Note Due on Visit 10    PT Start Time 1015    PT Stop Time 1100    PT Time Calculation (min) 45 min    Equipment Utilized During Treatment Gait belt    Activity Tolerance Patient tolerated treatment well    Behavior During Therapy WFL for tasks assessed/performed              Past Medical History:  Diagnosis Date   Alcohol abuse    Aortic atherosclerosis (HCC)    B12 deficiency    Cirrhosis (HCC)    COPD (chronic obstructive pulmonary disease) (HCC)    Coronary artery disease    GERD (gastroesophageal reflux disease)    Glaucoma    Grade II diastolic dysfunction    History of kidney stones    HLD (hyperlipidemia)    Hx of radiation therapy    Hypertension    Lung mass    Moderate mitral regurgitation    Pneumonia    PVD (peripheral vascular disease) (HCC)    Squamous cell carcinoma of lung, right (HCC) 2019   Past Surgical History:  Procedure Laterality Date   COLONOSCOPY WITH PROPOFOL     COLONOSCOPY WITH PROPOFOL N/A 11/25/2018   Procedure: COLONOSCOPY WITH PROPOFOL;  Surgeon: Christena Deem, MD;  Location: [facility] ENDOSCOPY;  Service: Endoscopy;  Laterality: N/A;   COLONOSCOPY WITH PROPOFOL N/A 11/29/2021   Procedure: COLONOSCOPY WITH PROPOFOL;  Surgeon: Toledo, Boykin Nearing, MD;  Location: [facility] ENDOSCOPY;  Service: Gastroenterology;  Laterality: N/A;   ENDARTERECTOMY FEMORAL Left 07/13/2020   Procedure: ENDARTERECTOMY FEMORAL ( SFA STENT);  Surgeon: Renford Dills, MD;  Location: [facility] ORS;  Service: Vascular;  Laterality: Left;    ESOPHAGOGASTRODUODENOSCOPY (EGD) WITH PROPOFOL N/A 11/25/2018   Procedure: ESOPHAGOGASTRODUODENOSCOPY (EGD) WITH PROPOFOL;  Surgeon: Christena Deem, MD;  Location: [facility] ENDOSCOPY;  Service: Endoscopy;  Laterality: N/A;   ESOPHAGOGASTRODUODENOSCOPY (EGD) WITH PROPOFOL N/A 11/29/2021   Procedure: ESOPHAGOGASTRODUODENOSCOPY (EGD) WITH PROPOFOL;  Surgeon: Toledo, Boykin Nearing, MD;  Location: [facility] ENDOSCOPY;  Service: Gastroenterology;  Laterality: N/A;   ESOPHAGOGASTRODUODENOSCOPY (EGD) WITH PROPOFOL N/A 12/08/2021   Procedure: ESOPHAGOGASTRODUODENOSCOPY (EGD) WITH PROPOFOL;  Surgeon: Wyline Mood, MD;  Location: [facility] ENDOSCOPY;  Service: Gastroenterology;  Laterality: N/A;   LEG AMPUTATION ABOVE KNEE Left    LOWER EXTREMITY ANGIOGRAPHY Left 06/07/2020   Procedure: LOWER EXTREMITY ANGIOGRAPHY;  Surgeon: Renford Dills, MD;  Location: [facility] INVASIVE CV LAB;  Service: Cardiovascular;  Laterality: Left;   LOWER EXTREMITY ANGIOGRAPHY Left 01/10/2021   Procedure: LOWER EXTREMITY ANGIOGRAPHY;  Surgeon: Renford Dills, MD;  Location: [facility] INVASIVE CV LAB;  Service: Cardiovascular;  Laterality: Left;   Patient Active Problem List   Diagnosis Date Noted   UGIB (upper gastrointestinal bleed)    Malnutrition of moderate degree 12/06/2021   Melena 12/05/2021   COPD (chronic obstructive pulmonary disease) (HCC)    Grade II diastolic dysfunction    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), left (HCC)    AKI (acute kidney injury) (HCC)  Atherosclerosis of artery of extremity with ulceration (HCC) 07/13/2020   Atherosclerosis of native arteries of the extremities with ulceration (HCC) 05/19/2020   Alcoholic cirrhosis of 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) 04/09/2019   Hyperlipidemia, mixed 04/09/2019   Malignant 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) 04/09/2019   Thrombocytopenia (HCC) 04/09/2019   CAD (coronary artery disease) 10/14/2017   Lung cancer (HCC) 10/14/2017   Tobacco abuse 08/14/2017    Benign essential hypertension 09/09/2013    PCP: Dorothey Baseman, MD  REFERRING PROVIDER: Marcello Fennel, MD  ONSET DATE: 05/14/2023 received new Microprocessor Knee  REFERRING DIAG: Acquired absence of left leg above knee V49.76 & Z89.612  THERAPY DIAG:  Other abnormalities of gait and mobility  Unsteadiness on feet  Muscle weakness (generalized)  Stiffness of left hip, not elsewhere classified  Abnormal posture  Rationale for Evaluation and Treatment: Rehabilitation  SUBJECTIVE:   SUBJECTIVE STATEMENT: He has been wearing prosthesis daily and working on sit to/from stand.    Pt accompanied by: significant other  PERTINENT HISTORY: PAD, CAD, angina, malignant neoplasm of right lung, HLD, alcoholic cirrhosis of liver, HTN, COPD   PAIN:  Are you having pain? No  PRECAUTIONS: None  WEIGHT BEARING RESTRICTIONS: No  FALLS: Has patient fallen in last 6 months? No  LIVING ENVIRONMENT: Lives with: lives with their spouse Lives in: House Home Access: Stairs to enter and Ramped entrance Home layout: One level and single step into washer / dryer Stairs: Yes: Internal: 1 steps; none and External: 2 steps; on left going up Has following equipment at home: Single point cane, Walker - 2 wheeled, Crutches, Wheelchair (manual), shower chair, and Ramped entry  OCCUPATION: retired  PLOF: walks independently with prosthesis and RW in community. He uses cane in home.   PATIENT GOALS:  to learn to use new prosthesis to walk in home and community with cane.   OBJECTIVE:  Patient-Specific Activity Scoring Scheme  "0" represents "unable to perform." "10" represents "able to perform at prior level. 0 1 2 3 4 5 6 7 8 9  10 (Date and Score)   Activity Eval     1. Walking in house with cane  1    2. standing balance with ADLs  2    3. stairs 2   4.    5.    Score 1.67    Total score = sum of the activity scores/number of activities Minimum detectable change (90%CI) for  average score = 2 points Minimum detectable change (90%CI) for single activity score = 3 points     COGNITION: Evaluation on 05/30/2023: Overall cognitive status: Within functional limits for tasks assessed  CARDIOVASCULAR RESPONSE: Evaluation on  05/30/2023: Functional activity: gait Pre-activity vitals: HR: 65 SpO2: 97% Post-activity vitals: HR: 86 SpO2: 96% Modified Borg scale for dyspnea: 2: mild shortness of breath  POSTURE: Evaluation on 05/30/2023: weight shift right  LOWER EXTREMITY ROM:  ROM P:passive  A:active Right eval Left eval  Hip flexion    Hip extension  Standing -10*  Hip abduction    Hip adduction    Hip internal rotation    Hip external rotation    Knee flexion    Knee extension    Ankle dorsiflexion    Ankle plantarflexion    Ankle inversion    Ankle eversion     (Blank rows = not tested)  LOWER EXTREMITY MMT:  MMT Right eval Left eval  Hip flexion    Hip extension    Hip  abduction    Hip adduction    Hip internal rotation    Hip external rotation    Knee flexion    Knee extension    Ankle dorsiflexion    Ankle plantarflexion    Ankle inversion    Ankle eversion    At Evaluation all strength testing is grossly seated and functionally standing / gait. (Blank rows = not tested)  TRANSFERS: Evaluation on 05/30/2023: Sit to stand: SBA (cues only) using BUEs on seat of 18" chair and stabilizes without UE support. Minor engagement of MPK prosthesis.  Stand to sit: SBA (cues only) using BUEs on seat of 18" chair and stabilizes without UE support. Minor engagement of MPK prosthesis.   FUNCTIONAL TESTs:  Evaluation on 05/30/2023: Berg Balance Scale: 32/56   GAIT: Evaluation on 05/30/2023: Gait pattern: step through pattern, decreased stance time- Left, decreased hip/knee flexion- Left, antalgic, and trunk flexed Distance walked: 150' Assistive device utilized: with TFA MPK prosthesis Single point cane (minA), Walker - 2 wheeled  (modified independent / cues for deviations) Level of assistance: Modified independence, SBA, and Min A Gait velocity: with RW comfortable / self-selected 1.03 ft/sec & fast pace 1.96 ft/sec;  with cane & minA comfortable / self-selected 0.67 ft/sec & fast pace 0.69 ft/sec;  CURRENT PROSTHETIC WEAR ASSESSMENT: Evaluation on 05/30/2023: Patient is independent with: skin check, residual limb care, care of non-amputated limb, prosthetic cleaning, ply sock cleaning, and correct ply sock adjustment Patient is dependent with: proper wear schedule/adjustment Donning prosthesis: SBA / verbal cues Doffing prosthesis: Modified independence Prosthetic wear tolerance: 10-12 hours/day, 7 days/week Prosthetic weight bearing tolerance: 10 minutes Residual limb condition: pt denies any issues.  Prosthetic description: silicon liner with suction ring suspension, Microprocessor Knee (MPK), dynamic response foot, Total Contact Socket with flexible inner socket K code/activity level with prosthetic use: Level 3    TODAY'S TREATMENT:                                                                                                                             DATE:  06/11/2023: Prosthetic Care with Transfemoral Microprocessor Knee (MPK) prosthesis: Patient able to perform sit to/from stand engaging prosthetic knee using BUEs on chair seat without having to touch RW for stabilization.  Motion is still not fluent but improved engagement of knee.  Patient performed 5 reps. PT demo and verbal cues on using similar sit to/from stand motion to pick up and place items on the floor.  Patient able to return demonstration picking up balled up paper from floor 10 reps with supervision and w/c behind,  RW close for safety.  Patient improved with repetition. Patient to work on sit to from stand 5 reps then pick up an item off the floor 10 reps with safe set up noted above after each meal so 3 times per day.  Patient and wife  verbalized understanding PT demo and verbal cues on step through gait pattern with equal step  length.  Patient ambulated 200' with rolling walker continuous motion to work on step through pattern.  PT and patient's wife noted improved prosthetic knee flexion and extension motion during gait cycle.  Patient to work on trying to use this pattern of gait for all ambulation throughout the day.  Patient and wife verbalized understanding with her ability to remind patient periodically during the day if she notices him reverting back to her old pattern. Patient ambulated 33' and 63' with cane stand-alone tip and HHA working on carryover of step through equal stride length gait pattern.  Patient requires minA for balance losses and HHA.   PT demo and verbal cues on using prosthetic hydraulic knee to descend 4 inch step.  Patient able to return demonstration 4 reps using rolling walker support with PT cues.  Patient should perform this more during therapy session before trying outside of therapy.     TREATMENT:                                                                                                                             DATE:  Evaluation on 05/30/2023: Prosthetic Care with Transfemoral Microprocessor Knee (MPK) prosthesis: See patient education below. PT demo & verbal cue on how to engage prosthesis with sit to/from stand. Pt able to return demo & verbalized understanding.   PATIENT EDUCATION: PATIENT EDUCATED ON FOLLOWING PROSTHETIC CARE: Education details:   Proper doffing with proper rotation Person educated: Patient and Spouse Education method: Explanation, Demonstration, Tactile cues, and Verbal cues Education comprehension: verbalized understanding and needs further education  HOME EXERCISE PROGRAM:  ASSESSMENT:  CLINICAL IMPRESSION: Patient improved prosthetic knee engagement with sit to / from stand and picking up light objects off the floor.  Patient's gait improved with using  step through pattern.  Patient continues to benefit from skilled PT to maximize function and safety with his MPK prosthesis.    OBJECTIVE IMPAIRMENTS: Abnormal gait, decreased activity tolerance, decreased balance, decreased endurance, decreased knowledge of condition, decreased knowledge of use of DME, decreased mobility, difficulty walking, decreased ROM, decreased strength, and prosthetic dependency .   ACTIVITY LIMITATIONS: carrying, lifting, standing, stairs, transfers, and locomotion level  PARTICIPATION LIMITATIONS: meal prep, community activity, church, and household mobility  PERSONAL FACTORS: Age, Fitness, Past/current experiences, Time since onset of injury/illness/exacerbation, and 3+ comorbidities: see PMH  are also affecting patient's functional outcome.   REHAB POTENTIAL: Good  CLINICAL DECISION MAKING: Evolving/moderate complexity  EVALUATION COMPLEXITY: Moderate   GOALS: Goals reviewed with patient? Yes  SHORT TERM GOALS: Target date: 06/27/2023  Patient reports able to don prosthesis with proper rotation modified independent. Baseline: SEE OBJECTIVE DATA Goal status: Ongoing  06/11/2023 2.  Patient able to sit to/from stand engaging hydraulics of prosthetic knee from 18" chair without armrests using BUEs.  Baseline: SEE OBJECTIVE DATA Goal status: Ongoing  06/11/2023  3.  Patient able to pick up object from floor without UE support using prosthetic hydraulics with supervision.  Baseline: SEE OBJECTIVE DATA Goal status: Ongoing  06/11/2023  4. Patient ambulates 100' with cane & prosthesis with minA. Baseline: SEE OBJECTIVE DATA Goal status: Ongoing  06/11/2023   LONG TERM GOALS: Target date: 08/28/2023  Patient demonstrates & verbalized understanding of prosthetic care to enable safe utilization of prosthesis. Baseline: SEE OBJECTIVE DATA Goal status: Ongoing  06/11/2023  Patient-Specific Activity Scoring >/= 4/10 Baseline: SEE OBJECTIVE DATA Goal status:  Ongoing  06/11/2023  Berg Balance >/= 40/56 (increase by >8 points) to indicate lower fall risk Baseline: SEE OBJECTIVE DATA Goal status: Ongoing  06/11/2023  Patient ambulates 100' with prosthesis and cane independently Baseline: SEE OBJECTIVE DATA Goal status: Ongoing  06/11/2023  Patient negotiates ramps & curbs with cane & prosthesis with supervision. Baseline: SEE OBJECTIVE DATA Goal status: Ongoing  06/11/2023  Patient negotiates stairs with single rail & prosthesis with supervision.  Baseline: SEE OBJECTIVE DATA Goal status: Ongoing  06/11/2023   PLAN:  PT FREQUENCY: 1x/week  PT DURATION: 90 days  PLANNED INTERVENTIONS: 97164- PT Re-evaluation, 97110-Therapeutic exercises, 97530- Therapeutic activity, 97112- Neuromuscular re-education, [id number]- Self Care, 60454- Gait training, [phone]- Prosthetic training, Patient/Family education, Balance training, and Stair training  PLAN FOR NEXT SESSION: sit to/from stand and progress to picking up weighted objects,  step down "riding" hydraulics on 4" step, continue to work on prosthetic gait with normal knee movement patterns.   Vladimir Faster, PT, DPT 06/11/2023, 11:31 AM   Date of referral: 05/17/2023 Referring provider: Marcello Fennel, MD Referring diagnosis? Acquired absence of left leg above knee V49.76 & Z89.612 Treatment diagnosis? (if different than referring diagnosis)  Other abnormalities of gait and mobility ICD-10-CM:  R26.89  Unsteadiness on feet ICD-10-CM:  R26.81  Muscle weakness (generalized) ICD-10-CM:  M62.81  Stiffness of left hip, not elsewhere classified M25.652  Abnormal posture R29.3  What was this (referring dx) caused by? Surgery (Type: AKA)  Nature of Condition: Initial Onset (within last 3 months)  reason for referral delivery of new prosthesis with significant change in design & function within last 3 months   Laterality: Lt  Current Functional Measure Score: Gait Velocity & assist with cane &  prosthesis - minA comfortable / self-selected 0.67 ft/sec & fast pace 0.69 ft/sec;  Objective measurements identify impairments when they are compared to normal values, the uninvolved extremity, and prior level of function.  [x]  Yes  []  No  Objective assessment of functional ability: Moderate functional limitations   Briefly describe symptoms: patient is dependent in gait & balance with new Microprocessor Knee prosthesis  How did symptoms start: amputation due to vascular issues  Average pain intensity:  Last 24 hours: 0  Past week: 0  How often does the pt experience symptoms? Frequently  How much have the symptoms interfered with usual daily activities? Moderately  How has condition changed since care began at this facility? NA - initial visit  In general, how is the patients overall health? Good   BACK PAIN (STarT Back Screening Tool) No

## 2023-06-18 ENCOUNTER — Ambulatory Visit (INDEPENDENT_AMBULATORY_CARE_PROVIDER_SITE_OTHER): Payer: 59 | Admitting: Physical Therapy

## 2023-06-18 ENCOUNTER — Encounter: Payer: Self-pay | Admitting: Physical Therapy

## 2023-06-18 DIAGNOSIS — M25652 Stiffness of left hip, not elsewhere classified: Secondary | ICD-10-CM

## 2023-06-18 DIAGNOSIS — M6281 Muscle weakness (generalized): Secondary | ICD-10-CM | POA: Diagnosis not present

## 2023-06-18 DIAGNOSIS — R2689 Other abnormalities of gait and mobility: Secondary | ICD-10-CM | POA: Diagnosis not present

## 2023-06-18 DIAGNOSIS — R293 Abnormal posture: Secondary | ICD-10-CM

## 2023-06-18 DIAGNOSIS — R2681 Unsteadiness on feet: Secondary | ICD-10-CM

## 2023-06-18 NOTE — Therapy (Signed)
 OUTPATIENT PHYSICAL THERAPY PROSTHETIC TREATMENT   Patient Name: Samuel Carney MRN: [id number] DOB:[birth year], 78 y.o., male Today's Date: 06/18/2023  END OF SESSION:  PT End of Session - 06/18/23 1021     Visit Number 3    Number of Visits 14    Date for PT Re-Evaluation 08/28/23    Authorization Type [employer] Medicare dual complete    Authorization Time Period NO AUTH NEEDED, NO COPAY, 10% COINSURANCE    Progress Note Due on Visit 10    PT Start Time 1015    PT Stop Time 1100    PT Time Calculation (min) 45 min    Equipment Utilized During Treatment Gait belt    Activity Tolerance Patient tolerated treatment well    Behavior During Therapy WFL for tasks assessed/performed               Past Medical History:  Diagnosis Date   Alcohol abuse    Aortic atherosclerosis (HCC)    B12 deficiency    Cirrhosis (HCC)    COPD (chronic obstructive pulmonary disease) (HCC)    Coronary artery disease    GERD (gastroesophageal reflux disease)    Glaucoma    Grade II diastolic dysfunction    History of kidney stones    HLD (hyperlipidemia)    Hx of radiation therapy    Hypertension    Lung mass    Moderate mitral regurgitation    Pneumonia    PVD (peripheral vascular disease) (HCC)    Squamous cell carcinoma of lung, right (HCC) 2019   Past Surgical History:  Procedure Laterality Date   COLONOSCOPY WITH PROPOFOL     COLONOSCOPY WITH PROPOFOL N/A 11/25/2018   Procedure: COLONOSCOPY WITH PROPOFOL;  Surgeon: Christena Deem, MD;  Location: [facility] ENDOSCOPY;  Service: Endoscopy;  Laterality: N/A;   COLONOSCOPY WITH PROPOFOL N/A 11/29/2021   Procedure: COLONOSCOPY WITH PROPOFOL;  Surgeon: Toledo, Boykin Nearing, MD;  Location: [facility] ENDOSCOPY;  Service: Gastroenterology;  Laterality: N/A;   ENDARTERECTOMY FEMORAL Left 07/13/2020   Procedure: ENDARTERECTOMY FEMORAL ( SFA STENT);  Surgeon: Renford Dills, MD;  Location: [facility] ORS;  Service: Vascular;  Laterality: Left;    ESOPHAGOGASTRODUODENOSCOPY (EGD) WITH PROPOFOL N/A 11/25/2018   Procedure: ESOPHAGOGASTRODUODENOSCOPY (EGD) WITH PROPOFOL;  Surgeon: Christena Deem, MD;  Location: [facility] ENDOSCOPY;  Service: Endoscopy;  Laterality: N/A;   ESOPHAGOGASTRODUODENOSCOPY (EGD) WITH PROPOFOL N/A 11/29/2021   Procedure: ESOPHAGOGASTRODUODENOSCOPY (EGD) WITH PROPOFOL;  Surgeon: Toledo, Boykin Nearing, MD;  Location: [facility] ENDOSCOPY;  Service: Gastroenterology;  Laterality: N/A;   ESOPHAGOGASTRODUODENOSCOPY (EGD) WITH PROPOFOL N/A 12/08/2021   Procedure: ESOPHAGOGASTRODUODENOSCOPY (EGD) WITH PROPOFOL;  Surgeon: Wyline Mood, MD;  Location: [facility] ENDOSCOPY;  Service: Gastroenterology;  Laterality: N/A;   LEG AMPUTATION ABOVE KNEE Left    LOWER EXTREMITY ANGIOGRAPHY Left 06/07/2020   Procedure: LOWER EXTREMITY ANGIOGRAPHY;  Surgeon: Renford Dills, MD;  Location: [facility] INVASIVE CV LAB;  Service: Cardiovascular;  Laterality: Left;   LOWER EXTREMITY ANGIOGRAPHY Left 01/10/2021   Procedure: LOWER EXTREMITY ANGIOGRAPHY;  Surgeon: Renford Dills, MD;  Location: [facility] INVASIVE CV LAB;  Service: Cardiovascular;  Laterality: Left;   Patient Active Problem List   Diagnosis Date Noted   UGIB (upper gastrointestinal bleed)    Malnutrition of moderate degree 12/06/2021   Melena 12/05/2021   COPD (chronic obstructive pulmonary disease) (HCC)    Grade II diastolic dysfunction    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), left (HCC)    AKI (acute kidney injury) (HCC)  Atherosclerosis of artery of extremity with ulceration (HCC) 07/13/2020   Atherosclerosis of native arteries of the extremities with ulceration (HCC) 05/19/2020   Alcoholic cirrhosis of 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) 04/09/2019   Hyperlipidemia, mixed 04/09/2019   Malignant 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) 04/09/2019   Thrombocytopenia (HCC) 04/09/2019   CAD (coronary artery disease) 10/14/2017   Lung cancer (HCC) 10/14/2017   Tobacco abuse 08/14/2017    Benign essential hypertension 09/09/2013    PCP: Dorothey Baseman, MD  REFERRING PROVIDER: Marcello Fennel, MD  ONSET DATE: 05/14/2023 received new Microprocessor Knee  REFERRING DIAG: Acquired absence of left leg above knee V49.76 & Z89.612  THERAPY DIAG:  Other abnormalities of gait and mobility  Unsteadiness on feet  Muscle weakness (generalized)  Stiffness of left hip, not elsewhere classified  Abnormal posture  Rationale for Evaluation and Treatment: Rehabilitation  SUBJECTIVE:   SUBJECTIVE STATEMENT: He is able to don prosthesis with less rotation. He has some limb soreness distal and anterior hip.  He is walking with cane near counter. He has done sit/stand and picking up paper which is improving.     Pt accompanied by: significant other  PERTINENT HISTORY: PAD, CAD, angina, malignant neoplasm of right lung, HLD, alcoholic cirrhosis of liver, HTN, COPD   PAIN:  Are you having pain? No  PRECAUTIONS: None  WEIGHT BEARING RESTRICTIONS: No  FALLS: Has patient fallen in last 6 months? No  LIVING ENVIRONMENT: Lives with: lives with their spouse Lives in: House Home Access: Stairs to enter and Ramped entrance Home layout: One level and single step into washer / dryer Stairs: Yes: Internal: 1 steps; none and External: 2 steps; on left going up Has following equipment at home: Single point cane, Walker - 2 wheeled, Crutches, Wheelchair (manual), shower chair, and Ramped entry  OCCUPATION: retired  PLOF: walks independently with prosthesis and RW in community. He uses cane in home.   PATIENT GOALS:  to learn to use new prosthesis to walk in home and community with cane.   OBJECTIVE:  Patient-Specific Activity Scoring Scheme  "0" represents "unable to perform." "10" represents "able to perform at prior level. 0 1 2 3 4 5 6 7 8 9  10 (Date and Score)   Activity Eval     1. Walking in house with cane  1    2. standing balance with ADLs  2    3. stairs 2    4.    5.    Score 1.67    Total score = sum of the activity scores/number of activities Minimum detectable change (90%CI) for average score = 2 points Minimum detectable change (90%CI) for single activity score = 3 points     COGNITION: Evaluation on 05/30/2023: Overall cognitive status: Within functional limits for tasks assessed  CARDIOVASCULAR RESPONSE: Evaluation on  05/30/2023: Functional activity: gait Pre-activity vitals: HR: 65 SpO2: 97% Post-activity vitals: HR: 86 SpO2: 96% Modified Borg scale for dyspnea: 2: mild shortness of breath  POSTURE: Evaluation on 05/30/2023: weight shift right  LOWER EXTREMITY ROM:  ROM P:passive  A:active Right eval Left eval  Hip flexion    Hip extension  Standing -10*  Hip abduction    Hip adduction    Hip internal rotation    Hip external rotation    Knee flexion    Knee extension    Ankle dorsiflexion    Ankle plantarflexion    Ankle inversion    Ankle eversion     (Blank  rows = not tested)  LOWER EXTREMITY MMT:  MMT Right eval Left eval  Hip flexion    Hip extension    Hip abduction    Hip adduction    Hip internal rotation    Hip external rotation    Knee flexion    Knee extension    Ankle dorsiflexion    Ankle plantarflexion    Ankle inversion    Ankle eversion    At Evaluation all strength testing is grossly seated and functionally standing / gait. (Blank rows = not tested)  TRANSFERS: Evaluation on 05/30/2023: Sit to stand: SBA (cues only) using BUEs on seat of 18" chair and stabilizes without UE support. Minor engagement of MPK prosthesis.  Stand to sit: SBA (cues only) using BUEs on seat of 18" chair and stabilizes without UE support. Minor engagement of MPK prosthesis.   FUNCTIONAL TESTs:  Evaluation on 05/30/2023: Berg Balance Scale: 32/56   GAIT: Evaluation on 05/30/2023: Gait pattern: step through pattern, decreased stance time- Left, decreased hip/knee flexion- Left, antalgic, and trunk  flexed Distance walked: 150' Assistive device utilized: with TFA MPK prosthesis Single point cane (minA), Walker - 2 wheeled (modified independent / cues for deviations) Level of assistance: Modified independence, SBA, and Min A Gait velocity: with RW comfortable / self-selected 1.03 ft/sec & fast pace 1.96 ft/sec;  with cane & minA comfortable / self-selected 0.67 ft/sec & fast pace 0.69 ft/sec;  CURRENT PROSTHETIC WEAR ASSESSMENT: Evaluation on 05/30/2023: Patient is independent with: skin check, residual limb care, care of non-amputated limb, prosthetic cleaning, ply sock cleaning, and correct ply sock adjustment Patient is dependent with: proper wear schedule/adjustment Donning prosthesis: SBA / verbal cues Doffing prosthesis: Modified independence Prosthetic wear tolerance: 10-12 hours/day, 7 days/week Prosthetic weight bearing tolerance: 10 minutes Residual limb condition: pt denies any issues.  Prosthetic description: silicon liner with suction ring suspension, Microprocessor Knee (MPK), dynamic response foot, Total Contact Socket with flexible inner socket K code/activity level with prosthetic use: Level 3    TODAY'S TREATMENT:                                                                                                                             DATE:  06/18/2023: Prosthetic Care with Transfemoral Microprocessor Knee (MPK) prosthesis: Try to do these exercises 3-5 times per day.   In front of chair with walker in front for safety: Stand up & sit down 5-10 reps without touching walker unless needed for balance. 5-10 reps Ball up piece of paper.  Lower yourself like sitting down to put paper on floor or pick it up.  Raise back upright similar to standing up.  Pick up 5# object using above technique. 5-10 reps  Near counter top or rail on deck / porch.  Walk forward with left hand near counter looking forward, not staring at floor. 5-10 reps Walk backwards with cane and left  hand near counter. 5-10 reps Walk sideways with cane  and left hand near counter.  Push off your outside leg.  5-10 reps Use above side stepping to work on turning 90*  push off outside leg and turn other leg to face forward to walk down counter.  When turning to your left you will need a chair back across from counter to touch.    Step up on 4" step with right leg and place prosthetic toes over the edge of step. As soon as prosthetic knee starts to bend step down with Right leg.  Turn around towards counter.  5-10 times   Pt amb 40' X 2 with cane with minA but slow guarded stance on prosthesis.    TREATMENT:                                                                                                                             DATE:  06/11/2023: Prosthetic Care with Transfemoral Microprocessor Knee (MPK) prosthesis: Patient able to perform sit to/from stand engaging prosthetic knee using BUEs on chair seat without having to touch RW for stabilization.  Motion is still not fluent but improved engagement of knee.  Patient performed 5 reps. PT demo and verbal cues on using similar sit to/from stand motion to pick up and place items on the floor.  Patient able to return demonstration picking up balled up paper from floor 10 reps with supervision and w/c behind,  RW close for safety.  Patient improved with repetition. Patient to work on sit to from stand 5 reps then pick up an item off the floor 10 reps with safe set up noted above after each meal so 3 times per day.  Patient and wife verbalized understanding PT demo and verbal cues on step through gait pattern with equal step length.  Patient ambulated 200' with rolling walker continuous motion to work on step through pattern.  PT and patient's wife noted improved prosthetic knee flexion and extension motion during gait cycle.  Patient to work on trying to use this pattern of gait for all ambulation throughout the day.  Patient and wife verbalized  understanding with her ability to remind patient periodically during the day if she notices him reverting back to her old pattern. Patient ambulated 26' and 3' with cane stand-alone tip and HHA working on carryover of step through equal stride length gait pattern.  Patient requires minA for balance losses and HHA.   PT demo and verbal cues on using prosthetic hydraulic knee to descend 4 inch step.  Patient able to return demonstration 4 reps using rolling walker support with PT cues.  Patient should perform this more during therapy session before trying outside of therapy.     TREATMENT:  DATE:  Evaluation on 05/30/2023: Prosthetic Care with Transfemoral Microprocessor Knee (MPK) prosthesis: See patient education below. PT demo & verbal cue on how to engage prosthesis with sit to/from stand. Pt able to return demo & verbalized understanding.   PATIENT EDUCATION: PATIENT EDUCATED ON FOLLOWING PROSTHETIC CARE: Education details:   Proper doffing with proper rotation Person educated: Patient and Spouse Education method: Explanation, Demonstration, Tactile cues, and Verbal cues Education comprehension: verbalized understanding and needs further education  HOME EXERCISE PROGRAM: Try to do these exercises 3-5 times per day.   In front of chair with walker in front for safety: Stand up & sit down 5-10 reps without touching walker unless needed for balance. 5-10 reps Ball up piece of paper.  Lower yourself like sitting down to put paper on floor or pick it up.  Raise back upright similar to standing up.  Pick up 5# object using above technique. 5-10 reps  Near counter top or rail on deck / porch.  Walk forward with left hand near counter looking forward, not staring at floor. 5-10 reps Walk backwards with cane and left hand near counter. 5-10 reps Walk sideways with cane  and left hand near counter.  Push off your outside leg.  5-10 reps Use above side stepping to work on turning 90*  push off outside leg and turn other leg to face forward to walk down counter.  When turning to your left you will need a chair back across from counter to touch.    Place 4" step in front of sink or rail and chair back across from it Step up on 4" step with right leg and place prosthetic toes over the edge of step. As soon as prosthetic knee starts to bend step down with Right leg.  Turn around towards counter.  5-10 times    ASSESSMENT:  CLINICAL IMPRESSION: Patient appears to understand HEP including working on gait with cane near counter. Patient continues to benefit from skilled PT to maximize function and safety with his MPK prosthesis.    OBJECTIVE IMPAIRMENTS: Abnormal gait, decreased activity tolerance, decreased balance, decreased endurance, decreased knowledge of condition, decreased knowledge of use of DME, decreased mobility, difficulty walking, decreased ROM, decreased strength, and prosthetic dependency .   ACTIVITY LIMITATIONS: carrying, lifting, standing, stairs, transfers, and locomotion level  PARTICIPATION LIMITATIONS: meal prep, community activity, church, and household mobility  PERSONAL FACTORS: Age, Fitness, Past/current experiences, Time since onset of injury/illness/exacerbation, and 3+ comorbidities: see PMH  are also affecting patient's functional outcome.   REHAB POTENTIAL: Good  CLINICAL DECISION MAKING: Evolving/moderate complexity  EVALUATION COMPLEXITY: Moderate   GOALS: Goals reviewed with patient? Yes  SHORT TERM GOALS: Target date: 06/27/2023  Patient reports able to don prosthesis with proper rotation modified independent. Baseline: SEE OBJECTIVE DATA Goal status: Ongoing  06/11/2023 2.  Patient able to sit to/from stand engaging hydraulics of prosthetic knee from 18" chair without armrests using BUEs.  Baseline: SEE OBJECTIVE  DATA Goal status: Ongoing  06/11/2023  3.  Patient able to pick up object from floor without UE support using prosthetic hydraulics with supervision. Baseline: SEE OBJECTIVE DATA Goal status: Ongoing  06/11/2023  4. Patient ambulates 100' with cane & prosthesis with minA. Baseline: SEE OBJECTIVE DATA Goal status: Ongoing  06/11/2023   LONG TERM GOALS: Target date: 08/28/2023  Patient demonstrates & verbalized understanding of prosthetic care to enable safe utilization of prosthesis. Baseline: SEE OBJECTIVE DATA Goal status: Ongoing  06/11/2023  Patient-Specific Activity Scoring >/= 4/10  Baseline: SEE OBJECTIVE DATA Goal status: Ongoing  06/11/2023  Berg Balance >/= 40/56 (increase by >8 points) to indicate lower fall risk Baseline: SEE OBJECTIVE DATA Goal status: Ongoing  06/11/2023  Patient ambulates 100' with prosthesis and cane independently Baseline: SEE OBJECTIVE DATA Goal status: Ongoing  06/11/2023  Patient negotiates ramps & curbs with cane & prosthesis with supervision. Baseline: SEE OBJECTIVE DATA Goal status: Ongoing  06/11/2023  Patient negotiates stairs with single rail & prosthesis with supervision.  Baseline: SEE OBJECTIVE DATA Goal status: Ongoing  06/11/2023   PLAN:  PT FREQUENCY: 1x/week  PT DURATION: 90 days  PLANNED INTERVENTIONS: 97164- PT Re-evaluation, 97110-Therapeutic exercises, 97530- Therapeutic activity, 97112- Neuromuscular re-education, [phone]- Self Care, 60454- Gait training, [phone]- Prosthetic training, Patient/Family education, Balance training, and Stair training  PLAN FOR NEXT SESSION:   check STGs, check sit to/from stand and progress to picking up weighted objects,  progress step down "riding" hydraulics to stairs with 2 rails, continue to work on prosthetic gait with normal knee movement patterns.   Vladimir Faster, PT, DPT 06/18/2023, 12:49 PM   Date of referral: 05/17/2023 Referring provider: Marcello Fennel, MD Referring diagnosis?  Acquired absence of left leg above knee V49.76 & Z89.612 Treatment diagnosis? (if different than referring diagnosis)  Other abnormalities of gait and mobility ICD-10-CM:  R26.89  Unsteadiness on feet ICD-10-CM:  R26.81  Muscle weakness (generalized) ICD-10-CM:  M62.81  Stiffness of left hip, not elsewhere classified M25.652  Abnormal posture R29.3  What was this (referring dx) caused by? Surgery (Type: AKA)  Nature of Condition: Initial Onset (within last 3 months)  reason for referral delivery of new prosthesis with significant change in design & function within last 3 months   Laterality: Lt  Current Functional Measure Score: Gait Velocity & assist with cane & prosthesis - minA comfortable / self-selected 0.67 ft/sec & fast pace 0.69 ft/sec;  Objective measurements identify impairments when they are compared to normal values, the uninvolved extremity, and prior level of function.  [x]  Yes  []  No  Objective assessment of functional ability: Moderate functional limitations   Briefly describe symptoms: patient is dependent in gait & balance with new Microprocessor Knee prosthesis  How did symptoms start: amputation due to vascular issues  Average pain intensity:  Last 24 hours: 0  Past week: 0  How often does the pt experience symptoms? Frequently  How much have the symptoms interfered with usual daily activities? Moderately  How has condition changed since care began at this facility? NA - initial visit  In general, how is the patients overall health? Good   BACK PAIN (STarT Back Screening Tool) No

## 2023-06-18 NOTE — Patient Instructions (Addendum)
 Try to do these exercises 3-5 times per day.   In front of chair with walker in front for safety: Stand up & sit down 5-10 reps without touching walker unless needed for balance. 5-10 reps Ball up piece of paper.  Lower yourself like sitting down to put paper on floor or pick it up.  Raise back upright similar to standing up.  Pick up 5# object using above technique. 5-10 reps  Near counter top or rail on deck / porch.  Walk forward with left hand near counter looking forward, not staring at floor. 5-10 reps Walk backwards with cane and left hand near counter. 5-10 reps Walk sideways with cane and left hand near counter.  Push off your outside leg.  5-10 reps Use above side stepping to work on turning 90*  push off outside leg and turn other leg to face forward to walk down counter.  When turning to your left you will need a chair back across from counter to touch.    Place 4" step in front of sink or rail and chair back across from it Step up on 4" step with right leg and place prosthetic toes over the edge of step. As soon as prosthetic knee starts to bend step down with Right leg.  Turn around towards counter.  5-10 times

## 2023-06-25 ENCOUNTER — Ambulatory Visit (INDEPENDENT_AMBULATORY_CARE_PROVIDER_SITE_OTHER): Payer: 59 | Admitting: Physical Therapy

## 2023-06-25 ENCOUNTER — Encounter: Payer: Self-pay | Admitting: Physical Therapy

## 2023-06-25 DIAGNOSIS — M25652 Stiffness of left hip, not elsewhere classified: Secondary | ICD-10-CM

## 2023-06-25 DIAGNOSIS — R2681 Unsteadiness on feet: Secondary | ICD-10-CM

## 2023-06-25 DIAGNOSIS — M6281 Muscle weakness (generalized): Secondary | ICD-10-CM

## 2023-06-25 DIAGNOSIS — R2689 Other abnormalities of gait and mobility: Secondary | ICD-10-CM

## 2023-06-25 DIAGNOSIS — R293 Abnormal posture: Secondary | ICD-10-CM

## 2023-06-25 NOTE — Therapy (Signed)
 OUTPATIENT PHYSICAL THERAPY PROSTHETIC TREATMENT   Patient Name: Samuel Carney MRN: [id number] DOB:[birth year], 78 y.o., male Today's Date: 06/25/2023  END OF SESSION:  PT End of Session - 06/25/23 1016     Visit Number 4    Number of Visits 14    Date for PT Re-Evaluation 08/28/23    Authorization Type [employer] Medicare dual complete    Authorization Time Period NO AUTH NEEDED, NO COPAY, 10% COINSURANCE    Progress Note Due on Visit 10    PT Start Time 1015    PT Stop Time 1058    PT Time Calculation (min) 43 min    Equipment Utilized During Treatment Gait belt    Activity Tolerance Patient tolerated treatment well    Behavior During Therapy WFL for tasks assessed/performed                Past Medical History:  Diagnosis Date   Alcohol abuse    Aortic atherosclerosis (HCC)    B12 deficiency    Cirrhosis (HCC)    COPD (chronic obstructive pulmonary disease) (HCC)    Coronary artery disease    GERD (gastroesophageal reflux disease)    Glaucoma    Grade II diastolic dysfunction    History of kidney stones    HLD (hyperlipidemia)    Hx of radiation therapy    Hypertension    Lung mass    Moderate mitral regurgitation    Pneumonia    PVD (peripheral vascular disease) (HCC)    Squamous cell carcinoma of lung, right (HCC) 2019   Past Surgical History:  Procedure Laterality Date   COLONOSCOPY WITH PROPOFOL     COLONOSCOPY WITH PROPOFOL N/A 11/25/2018   Procedure: COLONOSCOPY WITH PROPOFOL;  Surgeon: Christena Deem, MD;  Location: [facility] ENDOSCOPY;  Service: Endoscopy;  Laterality: N/A;   COLONOSCOPY WITH PROPOFOL N/A 11/29/2021   Procedure: COLONOSCOPY WITH PROPOFOL;  Surgeon: Toledo, Boykin Nearing, MD;  Location: [facility] ENDOSCOPY;  Service: Gastroenterology;  Laterality: N/A;   ENDARTERECTOMY FEMORAL Left 07/13/2020   Procedure: ENDARTERECTOMY FEMORAL ( SFA STENT);  Surgeon: Renford Dills, MD;  Location: [facility] ORS;  Service: Vascular;  Laterality: Left;    ESOPHAGOGASTRODUODENOSCOPY (EGD) WITH PROPOFOL N/A 11/25/2018   Procedure: ESOPHAGOGASTRODUODENOSCOPY (EGD) WITH PROPOFOL;  Surgeon: Christena Deem, MD;  Location: [facility] ENDOSCOPY;  Service: Endoscopy;  Laterality: N/A;   ESOPHAGOGASTRODUODENOSCOPY (EGD) WITH PROPOFOL N/A 11/29/2021   Procedure: ESOPHAGOGASTRODUODENOSCOPY (EGD) WITH PROPOFOL;  Surgeon: Toledo, Boykin Nearing, MD;  Location: [facility] ENDOSCOPY;  Service: Gastroenterology;  Laterality: N/A;   ESOPHAGOGASTRODUODENOSCOPY (EGD) WITH PROPOFOL N/A 12/08/2021   Procedure: ESOPHAGOGASTRODUODENOSCOPY (EGD) WITH PROPOFOL;  Surgeon: Wyline Mood, MD;  Location: [facility] ENDOSCOPY;  Service: Gastroenterology;  Laterality: N/A;   LEG AMPUTATION ABOVE KNEE Left    LOWER EXTREMITY ANGIOGRAPHY Left 06/07/2020   Procedure: LOWER EXTREMITY ANGIOGRAPHY;  Surgeon: Renford Dills, MD;  Location: [facility] INVASIVE CV LAB;  Service: Cardiovascular;  Laterality: Left;   LOWER EXTREMITY ANGIOGRAPHY Left 01/10/2021   Procedure: LOWER EXTREMITY ANGIOGRAPHY;  Surgeon: Renford Dills, MD;  Location: [facility] INVASIVE CV LAB;  Service: Cardiovascular;  Laterality: Left;   Patient Active Problem List   Diagnosis Date Noted   UGIB (upper gastrointestinal bleed)    Malnutrition of moderate degree 12/06/2021   Melena 12/05/2021   COPD (chronic obstructive pulmonary disease) (HCC)    Grade II diastolic dysfunction    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), left (HCC)    AKI (acute kidney injury) (  HCC)    Atherosclerosis of artery of extremity with ulceration (HCC) 07/13/2020   Atherosclerosis of native arteries of the extremities with ulceration (HCC) 05/19/2020   Alcoholic cirrhosis of 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) 04/09/2019   Hyperlipidemia, mixed 04/09/2019   Malignant 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) 04/09/2019   Thrombocytopenia (HCC) 04/09/2019   CAD (coronary artery disease) 10/14/2017   Lung cancer (HCC) 10/14/2017   Tobacco abuse 08/14/2017    Benign essential hypertension 09/09/2013    PCP: Dorothey Baseman, MD  REFERRING PROVIDER: Marcello Fennel, MD  ONSET DATE: 05/14/2023 received new Microprocessor Knee  REFERRING DIAG: Acquired absence of left leg above knee V49.76 & Z89.612  THERAPY DIAG:  Other abnormalities of gait and mobility  Unsteadiness on feet  Muscle weakness (generalized)  Stiffness of left hip, not elsewhere classified  Abnormal posture  Rationale for Evaluation and Treatment: Rehabilitation  SUBJECTIVE:   SUBJECTIVE STATEMENT: His  friend made a 4" box for him that he is not certain about using it.  He has been walking with cane near counter.  Pt accompanied by: significant other  PERTINENT HISTORY: PAD, CAD, angina, malignant neoplasm of right lung, HLD, alcoholic cirrhosis of liver, HTN, COPD   PAIN:  Are you having pain? No  PRECAUTIONS: None  WEIGHT BEARING RESTRICTIONS: No  FALLS: Has patient fallen in last 6 months? No  LIVING ENVIRONMENT: Lives with: lives with their spouse Lives in: House Home Access: Stairs to enter and Ramped entrance Home layout: One level and single step into washer / dryer Stairs: Yes: Internal: 1 steps; none and External: 2 steps; on left going up Has following equipment at home: Single point cane, Walker - 2 wheeled, Crutches, Wheelchair (manual), shower chair, and Ramped entry  OCCUPATION: retired  PLOF: walks independently with prosthesis and RW in community. He uses cane in home.   PATIENT GOALS:  to learn to use new prosthesis to walk in home and community with cane.   OBJECTIVE:  Patient-Specific Activity Scoring Scheme  "0" represents "unable to perform." "10" represents "able to perform at prior level. 0 1 2 3 4 5 6 7 8 9  10 (Date and Score)   Activity Eval     1. Walking in house with cane  1    2. standing balance with ADLs  2    3. stairs 2   4.    5.    Score 1.67    Total score = sum of the activity scores/number of  activities Minimum detectable change (90%CI) for average score = 2 points Minimum detectable change (90%CI) for single activity score = 3 points     COGNITION: Evaluation on 05/30/2023: Overall cognitive status: Within functional limits for tasks assessed  CARDIOVASCULAR RESPONSE: Evaluation on  05/30/2023: Functional activity: gait Pre-activity vitals: HR: 65 SpO2: 97% Post-activity vitals: HR: 86 SpO2: 96% Modified Borg scale for dyspnea: 2: mild shortness of breath  POSTURE: Evaluation on 05/30/2023: weight shift right  LOWER EXTREMITY ROM:  ROM P:passive  A:active Right eval Left eval  Hip flexion    Hip extension  Standing -10*  Hip abduction    Hip adduction    Hip internal rotation    Hip external rotation    Knee flexion    Knee extension    Ankle dorsiflexion    Ankle plantarflexion    Ankle inversion    Ankle eversion     (Blank rows = not tested)  LOWER EXTREMITY MMT:  MMT  Right eval Left eval  Hip flexion    Hip extension    Hip abduction    Hip adduction    Hip internal rotation    Hip external rotation    Knee flexion    Knee extension    Ankle dorsiflexion    Ankle plantarflexion    Ankle inversion    Ankle eversion    At Evaluation all strength testing is grossly seated and functionally standing / gait. (Blank rows = not tested)  TRANSFERS: 06/25/2023:  Patient able to sit to/from stand engaging hydraulics of prosthetic knee from 18" chair without armrests using BUEs  Evaluation on 05/30/2023: Sit to stand: SBA (cues only) using BUEs on seat of 18" chair and stabilizes without UE support. Minor engagement of MPK prosthesis.  Stand to sit: SBA (cues only) using BUEs on seat of 18" chair and stabilizes without UE support. Minor engagement of MPK prosthesis.   FUNCTIONAL TESTs:  06/25/2023:  Patient able to pick up object from floor without UE support using prosthetic hydraulics with supervision.  Evaluation on 05/30/2023:  Berg Balance  Scale: 32/56  GAIT: 06/25/2023:  Patient ambulates 100' with cane & prosthesis with minA.  Evaluation on 05/30/2023: Gait pattern: step through pattern, decreased stance time- Left, decreased hip/knee flexion- Left, antalgic, and trunk flexed Distance walked: 150' Assistive device utilized: with TFA MPK prosthesis Single point cane (minA), Walker - 2 wheeled (modified independent / cues for deviations) Level of assistance: Modified independence, SBA, and Min A Gait velocity: with RW comfortable / self-selected 1.03 ft/sec & fast pace 1.96 ft/sec;  with cane & minA comfortable / self-selected 0.67 ft/sec & fast pace 0.69 ft/sec;  CURRENT PROSTHETIC WEAR ASSESSMENT: 06/25/2023: patient able to don suction ring socket with proper rotation.    Evaluation on 05/30/2023: Patient is independent with: skin check, residual limb care, care of non-amputated limb, prosthetic cleaning, ply sock cleaning, and correct ply sock adjustment Patient is dependent with: proper wear schedule/adjustment Donning prosthesis: SBA / verbal cues Doffing prosthesis: Modified independence Prosthetic wear tolerance: 10-12 hours/day, 7 days/week Prosthetic weight bearing tolerance: 10 minutes Residual limb condition: pt denies any issues.  Prosthetic description: silicon liner with suction ring suspension, Microprocessor Knee (MPK), dynamic response foot, Total Contact Socket with flexible inner socket K code/activity level with prosthetic use: Level 3    TODAY'S TREATMENT:                                                                                                                             DATE:  06/25/2023: Prosthetic Care with Transfemoral Microprocessor Knee (MPK) prosthesis: PT reviewed standing up & sitting down with more fluency to prosthetic knee flexion.  Pt performed 5 reps with improved fluency.  Progressed to pick up light item 3 reps and 5# weight 3 reps working on not stopping motion so prosthetic knee  would not lock and stop motion of lowering himself downward to reach item.  PT educated  pt & wife in set up for safe environment. Pt & wife verbalized understanding.  Pt's wooden box that he had made was 4.3" tall, 18" wide X 10" deep.  PT demo & verbal cues on "riding" hydraulics of prosthetic knee with descending.  Pt's box would not allow box to fit within RW and box began to flip on 1 rep.  Pt performed 3 reps with his box and 2 reps with PT clinic 4" box.  He seems to understand how to engage prosthetic knee.  PT recommended that his box is 16" X 16" on top and pt / wife verbalized understanding including rationale for size.  Pt amb 120' with cane with minA.  PT educated on family assisting with min to CGA with support close to his LUE but not holding constantly.  If his RLE fatigues, then family can provide HHA until he reaches a chair.  Pt & wife verbalized understanding.     TREATMENT:                                                                                                                             DATE:  06/18/2023: Prosthetic Care with Transfemoral Microprocessor Knee (MPK) prosthesis: Try to do these exercises 3-5 times per day.   In front of chair with walker in front for safety: Stand up & sit down 5-10 reps without touching walker unless needed for balance. 5-10 reps Ball up piece of paper.  Lower yourself like sitting down to put paper on floor or pick it up.  Raise back upright similar to standing up.  Pick up 5# object using above technique. 5-10 reps  Near counter top or rail on deck / porch.  Walk forward with left hand near counter looking forward, not staring at floor. 5-10 reps Walk backwards with cane and left hand near counter. 5-10 reps Walk sideways with cane and left hand near counter.  Push off your outside leg.  5-10 reps Use above side stepping to work on turning 90*  push off outside leg and turn other leg to face forward to walk down counter.  When turning  to your left you will need a chair back across from counter to touch.    Step up on 4" step with right leg and place prosthetic toes over the edge of step. As soon as prosthetic knee starts to bend step down with Right leg.  Turn around towards counter.  5-10 times   Pt amb 40' X 2 with cane with minA but slow guarded stance on prosthesis.    TREATMENT:  DATE:  06/11/2023: Prosthetic Care with Transfemoral Microprocessor Knee (MPK) prosthesis: Patient able to perform sit to/from stand engaging prosthetic knee using BUEs on chair seat without having to touch RW for stabilization.  Motion is still not fluent but improved engagement of knee.  Patient performed 5 reps. PT demo and verbal cues on using similar sit to/from stand motion to pick up and place items on the floor.  Patient able to return demonstration picking up balled up paper from floor 10 reps with supervision and w/c behind,  RW close for safety.  Patient improved with repetition. Patient to work on sit to from stand 5 reps then pick up an item off the floor 10 reps with safe set up noted above after each meal so 3 times per day.  Patient and wife verbalized understanding PT demo and verbal cues on step through gait pattern with equal step length.  Patient ambulated 200' with rolling walker continuous motion to work on step through pattern.  PT and patient's wife noted improved prosthetic knee flexion and extension motion during gait cycle.  Patient to work on trying to use this pattern of gait for all ambulation throughout the day.  Patient and wife verbalized understanding with her ability to remind patient periodically during the day if she notices him reverting back to her old pattern. Patient ambulated 50' and 65' with cane stand-alone tip and HHA working on carryover of step through equal stride length gait pattern.   Patient requires minA for balance losses and HHA.   PT demo and verbal cues on using prosthetic hydraulic knee to descend 4 inch step.  Patient able to return demonstration 4 reps using rolling walker support with PT cues.  Patient should perform this more during therapy session before trying outside of therapy.    PATIENT EDUCATION: PATIENT EDUCATED ON FOLLOWING PROSTHETIC CARE: Education details:   Proper doffing with proper rotation Person educated: Patient and Spouse Education method: Explanation, Demonstration, Tactile cues, and Verbal cues Education comprehension: verbalized understanding and needs further education  HOME EXERCISE PROGRAM: Try to do these exercises 3-5 times per day.   In front of chair with walker in front for safety: Stand up & sit down 5-10 reps without touching walker unless needed for balance. 5-10 reps Ball up piece of paper.  Lower yourself like sitting down to put paper on floor or pick it up.  Raise back upright similar to standing up.  Pick up 5# object using above technique. 5-10 reps  Near counter top or rail on deck / porch.  Walk forward with left hand near counter looking forward, not staring at floor. 5-10 reps Walk backwards with cane and left hand near counter. 5-10 reps Walk sideways with cane and left hand near counter.  Push off your outside leg.  5-10 reps Use above side stepping to work on turning 90*  push off outside leg and turn other leg to face forward to walk down counter.  When turning to your left you will need a chair back across from counter to touch.    Place 4" step in front of sink or rail and chair back across from it Step up on 4" step with right leg and place prosthetic toes over the edge of step. As soon as prosthetic knee starts to bend step down with Right leg.  Turn around towards counter.  5-10 times    ASSESSMENT:  CLINICAL IMPRESSION: Patient met all STGs.  He is slowly improving functional use of  Microprocessor  Knee Prosthesis.   Patient continues to benefit from skilled PT to maximize function and safety with his MPK prosthesis.    OBJECTIVE IMPAIRMENTS: Abnormal gait, decreased activity tolerance, decreased balance, decreased endurance, decreased knowledge of condition, decreased knowledge of use of DME, decreased mobility, difficulty walking, decreased ROM, decreased strength, and prosthetic dependency .   ACTIVITY LIMITATIONS: carrying, lifting, standing, stairs, transfers, and locomotion level  PARTICIPATION LIMITATIONS: meal prep, community activity, church, and household mobility  PERSONAL FACTORS: Age, Fitness, Past/current experiences, Time since onset of injury/illness/exacerbation, and 3+ comorbidities: see PMH  are also affecting patient's functional outcome.   REHAB POTENTIAL: Good  CLINICAL DECISION MAKING: Evolving/moderate complexity  EVALUATION COMPLEXITY: Moderate   GOALS: Goals reviewed with patient? Yes  SHORT TERM GOALS: Target date: 06/27/2023  Patient reports able to don prosthesis with proper rotation modified independent. Baseline: SEE OBJECTIVE DATA Goal status: MET 06/25/2023 2.  Patient able to sit to/from stand engaging hydraulics of prosthetic knee from 18" chair without armrests using BUEs.  Baseline: SEE OBJECTIVE DATA Goal status: MET 06/25/2023  3.  Patient able to pick up object from floor without UE support using prosthetic hydraulics with supervision. Baseline: SEE OBJECTIVE DATA Goal status: MET  06/25/2023  4. Patient ambulates 100' with cane & prosthesis with minA. Baseline: SEE OBJECTIVE DATA Goal status: MET  06/25/2023   LONG TERM GOALS: Target date: 08/28/2023  Patient demonstrates & verbalized understanding of prosthetic care to enable safe utilization of prosthesis. Baseline: SEE OBJECTIVE DATA Goal status: Ongoing  06/25/2023  Patient-Specific Activity Scoring >/= 4/10 Baseline: SEE OBJECTIVE DATA Goal status: Ongoing  06/25/2023  Berg  Balance >/= 40/56 (increase by >8 points) to indicate lower fall risk Baseline: SEE OBJECTIVE DATA Goal status:  Ongoing  06/25/2023  Patient ambulates 100' with prosthesis and cane independently Baseline: SEE OBJECTIVE DATA Goal status: Ongoing  06/25/2023  Patient negotiates ramps & curbs with cane & prosthesis with supervision. Baseline: SEE OBJECTIVE DATA Goal status: Ongoing  06/25/2023  Patient negotiates stairs with single rail & prosthesis with supervision.  Baseline: SEE OBJECTIVE DATA Goal status: Ongoing  06/25/2023   PLAN:  PT FREQUENCY: 1x/week  PT DURATION: 90 days  PLANNED INTERVENTIONS: 97164- PT Re-evaluation, 97110-Therapeutic exercises, 97530- Therapeutic activity, 97112- Neuromuscular re-education, [phone]- Self Care, 95621- Gait training, [phone]- Prosthetic training, Patient/Family education, Balance training, and Stair training  PLAN FOR NEXT SESSION:  work towards LTGs,  progress step down "riding" hydraulics to stairs with 2 rails, continue to work on prosthetic gait with normal knee movement patterns.   Vladimir Faster, PT, DPT 06/25/2023, 12:21 PM   Date of referral: 05/17/2023 Referring provider: Marcello Fennel, MD Referring diagnosis? Acquired absence of left leg above knee V49.76 & Z89.612 Treatment diagnosis? (if different than referring diagnosis)  Other abnormalities of gait and mobility ICD-10-CM:  R26.89  Unsteadiness on feet ICD-10-CM:  R26.81  Muscle weakness (generalized) ICD-10-CM:  M62.81  Stiffness of left hip, not elsewhere classified M25.652  Abnormal posture R29.3  What was this (referring dx) caused by? Surgery (Type: AKA)  Nature of Condition: Initial Onset (within last 3 months)  reason for referral delivery of new prosthesis with significant change in design & function within last 3 months   Laterality: Lt  Current Functional Measure Score: Gait Velocity & assist with cane & prosthesis - minA comfortable / self-selected 0.67  ft/sec & fast pace 0.69 ft/sec;  Objective measurements identify impairments when they are compared to normal values, the uninvolved extremity, and  prior level of function.  [x]  Yes  []  No  Objective assessment of functional ability: Moderate functional limitations   Briefly describe symptoms: patient is dependent in gait & balance with new Microprocessor Knee prosthesis  How did symptoms start: amputation due to vascular issues  Average pain intensity:  Last 24 hours: 0  Past week: 0  How often does the pt experience symptoms? Frequently  How much have the symptoms interfered with usual daily activities? Moderately  How has condition changed since care began at this facility? NA - initial visit  In general, how is the patients overall health? Good   BACK PAIN (STarT Back Screening Tool) No

## 2023-07-02 ENCOUNTER — Ambulatory Visit (INDEPENDENT_AMBULATORY_CARE_PROVIDER_SITE_OTHER): Payer: 59 | Admitting: Physical Therapy

## 2023-07-02 ENCOUNTER — Encounter: Payer: Self-pay | Admitting: Physical Therapy

## 2023-07-02 DIAGNOSIS — R293 Abnormal posture: Secondary | ICD-10-CM

## 2023-07-02 DIAGNOSIS — R2689 Other abnormalities of gait and mobility: Secondary | ICD-10-CM

## 2023-07-02 DIAGNOSIS — R2681 Unsteadiness on feet: Secondary | ICD-10-CM | POA: Diagnosis not present

## 2023-07-02 DIAGNOSIS — M6281 Muscle weakness (generalized): Secondary | ICD-10-CM

## 2023-07-02 DIAGNOSIS — M25652 Stiffness of left hip, not elsewhere classified: Secondary | ICD-10-CM

## 2023-07-02 NOTE — Therapy (Signed)
 OUTPATIENT PHYSICAL THERAPY PROSTHETIC TREATMENT   Patient Name: Samuel Carney MRN: [id number] DOB:May 16, 1945, 78 y.o., male Today's Date: 07/02/2023  END OF SESSION:  PT End of Session - 07/02/23 0933     Visit Number 5    Number of Visits 14    Date for PT Re-Evaluation 08/28/23    Authorization Type [employer] Medicare dual complete    Authorization Time Period NO AUTH NEEDED, NO COPAY, 10% COINSURANCE    Progress Note Due on Visit 10    PT Start Time 0931    PT Stop Time 1014    PT Time Calculation (min) 43 min    Equipment Utilized During Treatment Gait belt    Activity Tolerance Patient tolerated treatment well    Behavior During Therapy WFL for tasks assessed/performed                 Past Medical History:  Diagnosis Date   Alcohol abuse    Aortic atherosclerosis (HCC)    B12 deficiency    Cirrhosis (HCC)    COPD (chronic obstructive pulmonary disease) (HCC)    Coronary artery disease    GERD (gastroesophageal reflux disease)    Glaucoma    Grade II diastolic dysfunction    History of kidney stones    HLD (hyperlipidemia)    Hx of radiation therapy    Hypertension    Lung mass    Moderate mitral regurgitation    Pneumonia    PVD (peripheral vascular disease) (HCC)    Squamous cell carcinoma of lung, right (HCC) 2019   Past Surgical History:  Procedure Laterality Date   COLONOSCOPY WITH PROPOFOL     COLONOSCOPY WITH PROPOFOL N/A 11/25/2018   Procedure: COLONOSCOPY WITH PROPOFOL;  Surgeon: Christena Deem, MD;  Location: [facility] ENDOSCOPY;  Service: Endoscopy;  Laterality: N/A;   COLONOSCOPY WITH PROPOFOL N/A 11/29/2021   Procedure: COLONOSCOPY WITH PROPOFOL;  Surgeon: Toledo, Boykin Nearing, MD;  Location: [facility] ENDOSCOPY;  Service: Gastroenterology;  Laterality: N/A;   ENDARTERECTOMY FEMORAL Left 07/13/2020   Procedure: ENDARTERECTOMY FEMORAL ( SFA STENT);  Surgeon: Renford Dills, MD;  Location: [facility] ORS;  Service: Vascular;  Laterality: Left;    ESOPHAGOGASTRODUODENOSCOPY (EGD) WITH PROPOFOL N/A 11/25/2018   Procedure: ESOPHAGOGASTRODUODENOSCOPY (EGD) WITH PROPOFOL;  Surgeon: Christena Deem, MD;  Location: [facility] ENDOSCOPY;  Service: Endoscopy;  Laterality: N/A;   ESOPHAGOGASTRODUODENOSCOPY (EGD) WITH PROPOFOL N/A 11/29/2021   Procedure: ESOPHAGOGASTRODUODENOSCOPY (EGD) WITH PROPOFOL;  Surgeon: Toledo, Boykin Nearing, MD;  Location: [facility] ENDOSCOPY;  Service: Gastroenterology;  Laterality: N/A;   ESOPHAGOGASTRODUODENOSCOPY (EGD) WITH PROPOFOL N/A 12/08/2021   Procedure: ESOPHAGOGASTRODUODENOSCOPY (EGD) WITH PROPOFOL;  Surgeon: Wyline Mood, MD;  Location: [facility] ENDOSCOPY;  Service: Gastroenterology;  Laterality: N/A;   LEG AMPUTATION ABOVE KNEE Left    LOWER EXTREMITY ANGIOGRAPHY Left 06/07/2020   Procedure: LOWER EXTREMITY ANGIOGRAPHY;  Surgeon: Renford Dills, MD;  Location: [facility] INVASIVE CV LAB;  Service: Cardiovascular;  Laterality: Left;   LOWER EXTREMITY ANGIOGRAPHY Left 01/10/2021   Procedure: LOWER EXTREMITY ANGIOGRAPHY;  Surgeon: Renford Dills, MD;  Location: [facility] INVASIVE CV LAB;  Service: Cardiovascular;  Laterality: Left;   Patient Active Problem List   Diagnosis Date Noted   UGIB (upper gastrointestinal bleed)    Malnutrition of moderate degree 12/06/2021   Melena 12/05/2021   COPD (chronic obstructive pulmonary disease) (HCC)    Grade II diastolic dysfunction    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), left (HCC)    AKI (acute kidney  injury) [facility])    Atherosclerosis of artery of extremity with ulceration (HCC) 07/13/2020   Atherosclerosis of native arteries of the extremities with ulceration (HCC) 05/19/2020   Alcoholic cirrhosis of 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) 04/09/2019   Hyperlipidemia, mixed 04/09/2019   Malignant 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) 04/09/2019   Thrombocytopenia (HCC) 04/09/2019   CAD (coronary artery disease) 10/14/2017   Lung cancer (HCC) 10/14/2017   Tobacco abuse 08/14/2017    Benign essential hypertension 09/09/2013    PCP: Dorothey Baseman, MD  REFERRING PROVIDER: Marcello Fennel, MD  ONSET DATE: 05/14/2023 received new Microprocessor Knee  REFERRING DIAG: Acquired absence of left leg above knee V49.76 & Z89.612  THERAPY DIAG:  Other abnormalities of gait and mobility  Unsteadiness on feet  Muscle weakness (generalized)  Stiffness of left hip, not elsewhere classified  Abnormal posture  Rationale for Evaluation and Treatment: Rehabilitation  SUBJECTIVE:   SUBJECTIVE STATEMENT: He got box made to PT recommendations and it works well.  He is doing HEP.  He is using cane in house some.  Pt accompanied by: significant other  PERTINENT HISTORY: PAD, CAD, angina, malignant neoplasm of right lung, HLD, alcoholic cirrhosis of liver, HTN, COPD   PAIN:  Are you having pain? No  PRECAUTIONS: None  WEIGHT BEARING RESTRICTIONS: No  FALLS: Has patient fallen in last 6 months? No  LIVING ENVIRONMENT: Lives with: lives with their spouse Lives in: House Home Access: Stairs to enter and Ramped entrance Home layout: One level and single step into washer / dryer Stairs: Yes: Internal: 1 steps; none and External: 2 steps; on left going up Has following equipment at home: Single point cane, Walker - 2 wheeled, Crutches, Wheelchair (manual), shower chair, and Ramped entry  OCCUPATION: retired  PLOF: walks independently with prosthesis and RW in community. He uses cane in home.   PATIENT GOALS:  to learn to use new prosthesis to walk in home and community with cane.   OBJECTIVE:  Patient-Specific Activity Scoring Scheme  "0" represents "unable to perform." "10" represents "able to perform at prior level. 0 1 2 3 4 5 6 7 8 9  10 (Date and Score)   Activity Eval     1. Walking in house with cane  1    2. standing balance with ADLs  2    3. stairs 2   4.    5.    Score 1.67    Total score = sum of the activity scores/number of  activities Minimum detectable change (90%CI) for average score = 2 points Minimum detectable change (90%CI) for single activity score = 3 points     COGNITION: Evaluation on 05/30/2023: Overall cognitive status: Within functional limits for tasks assessed  CARDIOVASCULAR RESPONSE: Evaluation on  05/30/2023: Functional activity: gait Pre-activity vitals: HR: 65 SpO2: 97% Post-activity vitals: HR: 86 SpO2: 96% Modified Borg scale for dyspnea: 2: mild shortness of breath  POSTURE: Evaluation on 05/30/2023: weight shift right  LOWER EXTREMITY ROM:  ROM P:passive  A:active Right eval Left eval  Hip flexion    Hip extension  Standing -10*  Hip abduction    Hip adduction    Hip internal rotation    Hip external rotation    Knee flexion    Knee extension    Ankle dorsiflexion    Ankle plantarflexion    Ankle inversion    Ankle eversion     (Blank rows = not tested)  LOWER EXTREMITY MMT:  MMT Right  eval Left eval  Hip flexion    Hip extension    Hip abduction    Hip adduction    Hip internal rotation    Hip external rotation    Knee flexion    Knee extension    Ankle dorsiflexion    Ankle plantarflexion    Ankle inversion    Ankle eversion    At Evaluation all strength testing is grossly seated and functionally standing / gait. (Blank rows = not tested)  TRANSFERS: 06/25/2023:  Patient able to sit to/from stand engaging hydraulics of prosthetic knee from 18" chair without armrests using BUEs  Evaluation on 05/30/2023: Sit to stand: SBA (cues only) using BUEs on seat of 18" chair and stabilizes without UE support. Minor engagement of MPK prosthesis.  Stand to sit: SBA (cues only) using BUEs on seat of 18" chair and stabilizes without UE support. Minor engagement of MPK prosthesis.   FUNCTIONAL TESTs:  06/25/2023:  Patient able to pick up object from floor without UE support using prosthetic hydraulics with supervision.  Evaluation on 05/30/2023:  Berg Balance  Scale: 32/56  GAIT: 06/25/2023:  Patient ambulates 100' with cane & prosthesis with minA.  Evaluation on 05/30/2023: Gait pattern: step through pattern, decreased stance time- Left, decreased hip/knee flexion- Left, antalgic, and trunk flexed Distance walked: 150' Assistive device utilized: with TFA MPK prosthesis Single point cane (minA), Walker - 2 wheeled (modified independent / cues for deviations) Level of assistance: Modified independence, SBA, and Min A Gait velocity: with RW comfortable / self-selected 1.03 ft/sec & fast pace 1.96 ft/sec;  with cane & minA comfortable / self-selected 0.67 ft/sec & fast pace 0.69 ft/sec;  CURRENT PROSTHETIC WEAR ASSESSMENT: 06/25/2023: patient able to don suction ring socket with proper rotation.    Evaluation on 05/30/2023: Patient is independent with: skin check, residual limb care, care of non-amputated limb, prosthetic cleaning, ply sock cleaning, and correct ply sock adjustment Patient is dependent with: proper wear schedule/adjustment Donning prosthesis: SBA / verbal cues Doffing prosthesis: Modified independence Prosthetic wear tolerance: 10-12 hours/day, 7 days/week Prosthetic weight bearing tolerance: 10 minutes Residual limb condition: pt denies any issues.  Prosthetic description: silicon liner with suction ring suspension, Microprocessor Knee (MPK), dynamic response foot, Total Contact Socket with flexible inner socket K code/activity level with prosthetic use: Level 3    TODAY'S TREATMENT:                                                                                                                             DATE:  07/02/2023: Prosthetic Care with Transfemoral Microprocessor Knee (MPK) prosthesis: Pt amb 40' with cane with HHA minA.   Worked on ambulating near counter, side stepping and turning.  See HEP below.     At counter:  Place a chair at end of counter so left hand will be on counter when you stand up. -Work on standing up  and walking with less hesitancy upon arising. Turn around  towards counter at each end.  On return trip your table should now be on your left side to touch as needed.  Work on this 5-10 times.  -Side step with cane & left hand on counter.  Going to right, push off left side when stepping.  Going to left, push off right side.  -Progress to turning to walk down counter.  Going to right, push off left side and turn right leg, then move cane, bring prosthesis around using "headlight"   Going to left, push off right side turning prosthesis (hit heel), reach left hand to table and walk forward.    TREATMENT:                                                                                                                             DATE:  06/25/2023: Prosthetic Care with Transfemoral Microprocessor Knee (MPK) prosthesis: PT reviewed standing up & sitting down with more fluency to prosthetic knee flexion.  Pt performed 5 reps with improved fluency.  Progressed to pick up light item 3 reps and 5# weight 3 reps working on not stopping motion so prosthetic knee would not lock and stop motion of lowering himself downward to reach item.  PT educated pt & wife in set up for safe environment. Pt & wife verbalized understanding.  Pt's wooden box that he had made was 4.3" tall, 18" wide X 10" deep.  PT demo & verbal cues on "riding" hydraulics of prosthetic knee with descending.  Pt's box would not allow box to fit within RW and box began to flip on 1 rep.  Pt performed 3 reps with his box and 2 reps with PT clinic 4" box.  He seems to understand how to engage prosthetic knee.  PT recommended that his box is 16" X 16" on top and pt / wife verbalized understanding including rationale for size.  Pt amb 120' with cane with minA.  PT educated on family assisting with min to CGA with support close to his LUE but not holding constantly.  If his RLE fatigues, then family can provide HHA until he reaches a chair.  Pt & wife  verbalized understanding.     TREATMENT:                                                                                                                             DATE:  06/18/2023: Prosthetic Care with  Transfemoral Microprocessor Knee (MPK) prosthesis: Try to do these exercises 3-5 times per day.   In front of chair with walker in front for safety: Stand up & sit down 5-10 reps without touching walker unless needed for balance. 5-10 reps Ball up piece of paper.  Lower yourself like sitting down to put paper on floor or pick it up.  Raise back upright similar to standing up.  Pick up 5# object using above technique. 5-10 reps  Near counter top or rail on deck / porch.  Walk forward with left hand near counter looking forward, not staring at floor. 5-10 reps Walk backwards with cane and left hand near counter. 5-10 reps Walk sideways with cane and left hand near counter.  Push off your outside leg.  5-10 reps Use above side stepping to work on turning 90*  push off outside leg and turn other leg to face forward to walk down counter.  When turning to your left you will need a chair back across from counter to touch.    Step up on 4" step with right leg and place prosthetic toes over the edge of step. As soon as prosthetic knee starts to bend step down with Right leg.  Turn around towards counter.  5-10 times   Pt amb 40' X 2 with cane with minA but slow guarded stance on prosthesis.      PATIENT EDUCATION: PATIENT EDUCATED ON FOLLOWING PROSTHETIC CARE: Education details:   Proper doffing with proper rotation Person educated: Patient and Spouse Education method: Explanation, Demonstration, Tactile cues, and Verbal cues Education comprehension: verbalized understanding and needs further education  HOME EXERCISE PROGRAM: Try to do these exercises 3-5 times per day.   In front of chair with walker in front for safety: Stand up & sit down 5-10 reps without touching walker unless  needed for balance. 5-10 reps Ball up piece of paper.  Lower yourself like sitting down to put paper on floor or pick it up.  Raise back upright similar to standing up.  Pick up 5# object using above technique. 5-10 reps  Near counter top or rail on deck / porch.  Walk forward with left hand near counter looking forward, not staring at floor. 5-10 reps Walk backwards with cane and left hand near counter. 5-10 reps Walk sideways with cane and left hand near counter.  Push off your outside leg.  5-10 reps Use above side stepping to work on turning 90*  push off outside leg and turn other leg to face forward to walk down counter.  When turning to your left you will need a chair back across from counter to touch.    Place 4" step in front of sink or rail and chair back across from it Step up on 4" step with right leg and place prosthetic toes over the edge of step. As soon as prosthetic knee starts to bend step down with Right leg.  Turn around towards counter.  5-10 times   07/02/2023: At counter:  Place a chair at end of counter so left hand will be on counter when you stand up. Work on standing up and walking with less hesitancy upon arising. Turn around towards counter at each end.  On return trip your table should now be on your left side to touch as needed.  Work on this 5-10 times.  Side step with cane & left hand on counter.  Going to right, push off left side when stepping.  Going to  left, push off right side.  Progress to turning to walk down counter.  Going to right, push off left side and turn right leg, then move cane, bring prosthesis around using "headlight"   Going to left, push off right side turning prosthesis (hit heel), reach left hand to table and walk forward.   ASSESSMENT:  CLINICAL IMPRESSION: Patient improved ability to initiate gait with less hesitancy and turning.   Patient continues to benefit from skilled PT to maximize function and safety with his MPK prosthesis.     OBJECTIVE IMPAIRMENTS: Abnormal gait, decreased activity tolerance, decreased balance, decreased endurance, decreased knowledge of condition, decreased knowledge of use of DME, decreased mobility, difficulty walking, decreased ROM, decreased strength, and prosthetic dependency .   ACTIVITY LIMITATIONS: carrying, lifting, standing, stairs, transfers, and locomotion level  PARTICIPATION LIMITATIONS: meal prep, community activity, church, and household mobility  PERSONAL FACTORS: Age, Fitness, Past/current experiences, Time since onset of injury/illness/exacerbation, and 3+ comorbidities: see PMH  are also affecting patient's functional outcome.   REHAB POTENTIAL: Good  CLINICAL DECISION MAKING: Evolving/moderate complexity  EVALUATION COMPLEXITY: Moderate   GOALS: Goals reviewed with patient? Yes  SHORT TERM GOALS: Target date: 06/27/2023  Patient reports able to don prosthesis with proper rotation modified independent. Baseline: SEE OBJECTIVE DATA Goal status: MET 06/25/2023 2.  Patient able to sit to/from stand engaging hydraulics of prosthetic knee from 18" chair without armrests using BUEs.  Baseline: SEE OBJECTIVE DATA Goal status: MET 06/25/2023  3.  Patient able to pick up object from floor without UE support using prosthetic hydraulics with supervision. Baseline: SEE OBJECTIVE DATA Goal status: MET  06/25/2023  4. Patient ambulates 100' with cane & prosthesis with minA. Baseline: SEE OBJECTIVE DATA Goal status: MET  06/25/2023   LONG TERM GOALS: Target date: 08/28/2023  Patient demonstrates & verbalized understanding of prosthetic care to enable safe utilization of prosthesis. Baseline: SEE OBJECTIVE DATA Goal status: Ongoing   07/02/2023  Patient-Specific Activity Scoring >/= 4/10 Baseline: SEE OBJECTIVE DATA Goal status: Ongoing  07/02/2023  Berg Balance >/= 40/56 (increase by >8 points) to indicate lower fall risk Baseline: SEE OBJECTIVE DATA Goal status:  Ongoing    07/02/2023  Patient ambulates 100' with prosthesis and cane independently Baseline: SEE OBJECTIVE DATA Goal status: Ongoing   07/02/2023  Patient negotiates ramps & curbs with cane & prosthesis with supervision. Baseline: SEE OBJECTIVE DATA Goal status: Ongoing   07/02/2023  Patient negotiates stairs with single rail & prosthesis with supervision.  Baseline: SEE OBJECTIVE DATA Goal status: Ongoing  07/02/2023   PLAN:  PT FREQUENCY: 1x/week  PT DURATION: 90 days  PLANNED INTERVENTIONS: 97164- PT Re-evaluation, 97110-Therapeutic exercises, 97530- Therapeutic activity, 97112- Neuromuscular re-education, [phone]- Self Care, 29562- Gait training, [phone]- Prosthetic training, Patient/Family education, Balance training, and Stair training  PLAN FOR NEXT SESSION: check HEP,   work towards LTGs,  progress step down "riding" hydraulics to stairs with 2 rails, continue to work on prosthetic gait with normal knee movement patterns.   Vladimir Faster, PT, DPT 07/02/2023, 12:56 PM   Date of referral: 05/17/2023 Referring provider: Marcello Fennel, MD Referring diagnosis? Acquired absence of left leg above knee V49.76 & Z89.612 Treatment diagnosis? (if different than referring diagnosis)  Other abnormalities of gait and mobility ICD-10-CM:  R26.89  Unsteadiness on feet ICD-10-CM:  R26.81  Muscle weakness (generalized) ICD-10-CM:  M62.81  Stiffness of left hip, not elsewhere classified M25.652  Abnormal posture R29.3  What was this (referring dx) caused by? Surgery (Type: AKA)  Nature of Condition: Initial Onset (within last 3 months)  reason for referral delivery of new prosthesis with significant change in design & function within last 3 months   Laterality: Lt  Current Functional Measure Score: Gait Velocity & assist with cane & prosthesis - minA comfortable / self-selected 0.67 ft/sec & fast pace 0.69 ft/sec;  Objective measurements identify impairments when they are compared to normal  values, the uninvolved extremity, and prior level of function.  [x]  Yes  []  No  Objective assessment of functional ability: Moderate functional limitations   Briefly describe symptoms: patient is dependent in gait & balance with new Microprocessor Knee prosthesis  How did symptoms start: amputation due to vascular issues  Average pain intensity:  Last 24 hours: 0  Past week: 0  How often does the pt experience symptoms? Frequently  How much have the symptoms interfered with usual daily activities? Moderately  How has condition changed since care began at this facility? NA - initial visit  In general, how is the patients overall health? Good   BACK PAIN (STarT Back Screening Tool) No

## 2023-07-02 NOTE — Patient Instructions (Addendum)
 At counter:  Place a chair at end of counter so left hand will be on counter when you stand up. Work on standing up and walking with less hesitancy upon arising. Turn around towards counter at each end.  On return trip your table should now be on your left side to touch as needed.  Work on this 5-10 times.  Side step with cane & left hand on counter.  Going to right, push off left side when stepping.  Going to left, push off right side.  Progress to turning to walk down counter.  Going to right, push off left side and turn right leg, then move cane, bring prosthesis around using "headlight"   Going to left, push off right side turning prosthesis (hit heel), reach left hand to table and walk forward.

## 2023-07-09 ENCOUNTER — Encounter: Payer: Self-pay | Admitting: Physical Therapy

## 2023-07-09 ENCOUNTER — Ambulatory Visit (INDEPENDENT_AMBULATORY_CARE_PROVIDER_SITE_OTHER): Payer: 59 | Admitting: Physical Therapy

## 2023-07-09 DIAGNOSIS — M25652 Stiffness of left hip, not elsewhere classified: Secondary | ICD-10-CM

## 2023-07-09 DIAGNOSIS — R2689 Other abnormalities of gait and mobility: Secondary | ICD-10-CM | POA: Diagnosis not present

## 2023-07-09 DIAGNOSIS — R2681 Unsteadiness on feet: Secondary | ICD-10-CM

## 2023-07-09 DIAGNOSIS — M6281 Muscle weakness (generalized): Secondary | ICD-10-CM | POA: Diagnosis not present

## 2023-07-09 DIAGNOSIS — R293 Abnormal posture: Secondary | ICD-10-CM

## 2023-07-09 NOTE — Therapy (Signed)
 OUTPATIENT PHYSICAL THERAPY PROSTHETIC TREATMENT   Patient Name: Samuel Carney MRN: [id number] DOB:[birth year], 78 y.o., male Today's Date: 07/09/2023  END OF SESSION:  PT End of Session - 07/09/23 0934     Visit Number 6    Number of Visits 14    Date for PT Re-Evaluation 08/28/23    Authorization Type [employer] Medicare dual complete    Authorization Time Period NO AUTH NEEDED, NO COPAY, 10% COINSURANCE    Progress Note Due on Visit 10    PT Start Time 0932    PT Stop Time 1013    PT Time Calculation (min) 41 min    Equipment Utilized During Treatment Gait belt    Activity Tolerance Patient tolerated treatment well    Behavior During Therapy WFL for tasks assessed/performed                  Past Medical History:  Diagnosis Date   Alcohol abuse    Aortic atherosclerosis (HCC)    B12 deficiency    Cirrhosis (HCC)    COPD (chronic obstructive pulmonary disease) (HCC)    Coronary artery disease    GERD (gastroesophageal reflux disease)    Glaucoma    Grade II diastolic dysfunction    History of kidney stones    HLD (hyperlipidemia)    Hx of radiation therapy    Hypertension    Lung mass    Moderate mitral regurgitation    Pneumonia    PVD (peripheral vascular disease) (HCC)    Squamous cell carcinoma of lung, right (HCC) 2019   Past Surgical History:  Procedure Laterality Date   COLONOSCOPY WITH PROPOFOL     COLONOSCOPY WITH PROPOFOL N/A 11/25/2018   Procedure: COLONOSCOPY WITH PROPOFOL;  Surgeon: Christena Deem, MD;  Location: [facility] ENDOSCOPY;  Service: Endoscopy;  Laterality: N/A;   COLONOSCOPY WITH PROPOFOL N/A 11/29/2021   Procedure: COLONOSCOPY WITH PROPOFOL;  Surgeon: Toledo, Boykin Nearing, MD;  Location: [facility] ENDOSCOPY;  Service: Gastroenterology;  Laterality: N/A;   ENDARTERECTOMY FEMORAL Left 07/13/2020   Procedure: ENDARTERECTOMY FEMORAL ( SFA STENT);  Surgeon: Renford Dills, MD;  Location: [facility] ORS;  Service: Vascular;  Laterality: Left;    ESOPHAGOGASTRODUODENOSCOPY (EGD) WITH PROPOFOL N/A 11/25/2018   Procedure: ESOPHAGOGASTRODUODENOSCOPY (EGD) WITH PROPOFOL;  Surgeon: Christena Deem, MD;  Location: [facility] ENDOSCOPY;  Service: Endoscopy;  Laterality: N/A;   ESOPHAGOGASTRODUODENOSCOPY (EGD) WITH PROPOFOL N/A 11/29/2021   Procedure: ESOPHAGOGASTRODUODENOSCOPY (EGD) WITH PROPOFOL;  Surgeon: Toledo, Boykin Nearing, MD;  Location: [facility] ENDOSCOPY;  Service: Gastroenterology;  Laterality: N/A;   ESOPHAGOGASTRODUODENOSCOPY (EGD) WITH PROPOFOL N/A 12/08/2021   Procedure: ESOPHAGOGASTRODUODENOSCOPY (EGD) WITH PROPOFOL;  Surgeon: Wyline Mood, MD;  Location: [facility] ENDOSCOPY;  Service: Gastroenterology;  Laterality: N/A;   LEG AMPUTATION ABOVE KNEE Left    LOWER EXTREMITY ANGIOGRAPHY Left 06/07/2020   Procedure: LOWER EXTREMITY ANGIOGRAPHY;  Surgeon: Renford Dills, MD;  Location: [facility] INVASIVE CV LAB;  Service: Cardiovascular;  Laterality: Left;   LOWER EXTREMITY ANGIOGRAPHY Left 01/10/2021   Procedure: LOWER EXTREMITY ANGIOGRAPHY;  Surgeon: Renford Dills, MD;  Location: [facility] INVASIVE CV LAB;  Service: Cardiovascular;  Laterality: Left;   Patient Active Problem List   Diagnosis Date Noted   UGIB (upper gastrointestinal bleed)    Malnutrition of moderate degree 12/06/2021   Melena 12/05/2021   COPD (chronic obstructive pulmonary disease) (HCC)    Grade II diastolic dysfunction    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), left (HCC)    AKI (acute  kidney injury) (HCC)    Atherosclerosis of artery of extremity with ulceration (HCC) 07/13/2020   Atherosclerosis of native arteries of the extremities with ulceration (HCC) 05/19/2020   Alcoholic cirrhosis of 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) 04/09/2019   Hyperlipidemia, mixed 04/09/2019   Malignant 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) 04/09/2019   Thrombocytopenia (HCC) 04/09/2019   CAD (coronary artery disease) 10/14/2017   Lung cancer (HCC) 10/14/2017   Tobacco abuse 08/14/2017    Benign essential hypertension 09/09/2013    PCP: Dorothey Baseman, MD  REFERRING PROVIDER: Marcello Fennel, MD  ONSET DATE: 05/14/2023 received new Microprocessor Knee  REFERRING DIAG: Acquired absence of left leg above knee V49.76 & Z89.612  THERAPY DIAG:  Other abnormalities of gait and mobility  Unsteadiness on feet  Muscle weakness (generalized)  Stiffness of left hip, not elsewhere classified  Abnormal posture  Rationale for Evaluation and Treatment: Rehabilitation  SUBJECTIVE:   SUBJECTIVE STATEMENT: He has been doing the exercises including at counter.  He feels like he is starting to trust the new knee.  Pt accompanied by: significant other  PERTINENT HISTORY: PAD, CAD, angina, malignant neoplasm of right lung, HLD, alcoholic cirrhosis of liver, HTN, COPD   PAIN:  Are you having pain? No  PRECAUTIONS: None  WEIGHT BEARING RESTRICTIONS: No  FALLS: Has patient fallen in last 6 months? No  LIVING ENVIRONMENT: Lives with: lives with their spouse Lives in: House Home Access: Stairs to enter and Ramped entrance Home layout: One level and single step into washer / dryer Stairs: Yes: Internal: 1 steps; none and External: 2 steps; on left going up Has following equipment at home: Single point cane, Walker - 2 wheeled, Crutches, Wheelchair (manual), shower chair, and Ramped entry  OCCUPATION: retired  PLOF: walks independently with prosthesis and RW in community. He uses cane in home.   PATIENT GOALS:  to learn to use new prosthesis to walk in home and community with cane.   OBJECTIVE:  Patient-Specific Activity Scoring Scheme  "0" represents "unable to perform." "10" represents "able to perform at prior level. 0 1 2 3 4 5 6 7 8 9  10 (Date and Score)   Activity Eval     1. Walking in house with cane  1    2. standing balance with ADLs  2    3. stairs 2   4.    5.    Score 1.67    Total score = sum of the activity scores/number of  activities Minimum detectable change (90%CI) for average score = 2 points Minimum detectable change (90%CI) for single activity score = 3 points     COGNITION: Evaluation on 05/30/2023: Overall cognitive status: Within functional limits for tasks assessed  CARDIOVASCULAR RESPONSE: Evaluation on  05/30/2023: Functional activity: gait Pre-activity vitals: HR: 65 SpO2: 97% Post-activity vitals: HR: 86 SpO2: 96% Modified Borg scale for dyspnea: 2: mild shortness of breath  POSTURE: Evaluation on 05/30/2023: weight shift right  LOWER EXTREMITY ROM:  ROM P:passive  A:active Right eval Left eval  Hip flexion    Hip extension  Standing -10*  Hip abduction    Hip adduction    Hip internal rotation    Hip external rotation    Knee flexion    Knee extension    Ankle dorsiflexion    Ankle plantarflexion    Ankle inversion    Ankle eversion     (Blank rows = not tested)  LOWER EXTREMITY MMT:  MMT Right eval Left  eval  Hip flexion    Hip extension    Hip abduction    Hip adduction    Hip internal rotation    Hip external rotation    Knee flexion    Knee extension    Ankle dorsiflexion    Ankle plantarflexion    Ankle inversion    Ankle eversion    At Evaluation all strength testing is grossly seated and functionally standing / gait. (Blank rows = not tested)  TRANSFERS: 06/25/2023:  Patient able to sit to/from stand engaging hydraulics of prosthetic knee from 18" chair without armrests using BUEs  Evaluation on 05/30/2023: Sit to stand: SBA (cues only) using BUEs on seat of 18" chair and stabilizes without UE support. Minor engagement of MPK prosthesis.  Stand to sit: SBA (cues only) using BUEs on seat of 18" chair and stabilizes without UE support. Minor engagement of MPK prosthesis.   FUNCTIONAL TESTs:  06/25/2023:  Patient able to pick up object from floor without UE support using prosthetic hydraulics with supervision.  Evaluation on 05/30/2023:  Berg Balance  Scale: 32/56  GAIT: 06/25/2023:  Patient ambulates 100' with cane & prosthesis with minA.  Evaluation on 05/30/2023: Gait pattern: step through pattern, decreased stance time- Left, decreased hip/knee flexion- Left, antalgic, and trunk flexed Distance walked: 150' Assistive device utilized: with TFA MPK prosthesis Single point cane (minA), Walker - 2 wheeled (modified independent / cues for deviations) Level of assistance: Modified independence, SBA, and Min A Gait velocity: with RW comfortable / self-selected 1.03 ft/sec & fast pace 1.96 ft/sec;  with cane & minA comfortable / self-selected 0.67 ft/sec & fast pace 0.69 ft/sec;  CURRENT PROSTHETIC WEAR ASSESSMENT: 06/25/2023: patient able to don suction ring socket with proper rotation.    Evaluation on 05/30/2023: Patient is independent with: skin check, residual limb care, care of non-amputated limb, prosthetic cleaning, ply sock cleaning, and correct ply sock adjustment Patient is dependent with: proper wear schedule/adjustment Donning prosthesis: SBA / verbal cues Doffing prosthesis: Modified independence Prosthetic wear tolerance: 10-12 hours/day, 7 days/week Prosthetic weight bearing tolerance: 10 minutes Residual limb condition: pt denies any issues.  Prosthetic description: silicon liner with suction ring suspension, Microprocessor Knee (MPK), dynamic response foot, Total Contact Socket with flexible inner socket K code/activity level with prosthetic use: Level 3    TODAY'S TREATMENT:                                                                                                                             DATE:  07/09/2023: Prosthetic Care with Transfemoral Microprocessor Knee (MPK) prosthesis: Patient ambulated 25' with cane stand-alone tip and HHA min assist. Stand to sit improved by moving the cane to the left upper extremity and reaching to the chair seat with right upper extremity.  PT noted improved prosthetic knee flexion  during his sit down. PT demo and verbal cues on full weight shift over the prosthesis in stance as noted by right  lower extremity heel rise prior to advancing right lower extremity.  Patient ambulated 100 feet with rolling walker with focus on full weight shift over the prosthesis in stance, RLE in terminal stance heel rise prior to stepping/swing and right lower extremity step length so that he will make contact with ground passed the prosthetic toe line. Patient ambulated 100' x 2 with HHA /min assist with focus on carryover of above. Worked on being able to turn 90 degrees and initiate gait.  Patient performed 3 reps to the right and 3 reps to the left with cane and hand-held assist.   TREATMENT:                                                                                                                             DATE:  07/02/2023: Prosthetic Care with Transfemoral Microprocessor Knee (MPK) prosthesis: Pt amb 40' with cane with HHA minA.   Worked on ambulating near counter, side stepping and turning.  See HEP below.     At counter:  Place a chair at end of counter so left hand will be on counter when you stand up. -Work on standing up and walking with less hesitancy upon arising. Turn around towards counter at each end.  On return trip your table should now be on your left side to touch as needed.  Work on this 5-10 times.  -Side step with cane & left hand on counter.  Going to right, push off left side when stepping.  Going to left, push off right side.  -Progress to turning to walk down counter.  Going to right, push off left side and turn right leg, then move cane, bring prosthesis around using "headlight"   Going to left, push off right side turning prosthesis (hit heel), reach left hand to table and walk forward.    TREATMENT:                                                                                                                             DATE:  06/25/2023: Prosthetic Care  with Transfemoral Microprocessor Knee (MPK) prosthesis: PT reviewed standing up & sitting down with more fluency to prosthetic knee flexion.  Pt performed 5 reps with improved fluency.  Progressed to pick up light item 3 reps and 5# weight 3 reps working on not stopping motion so prosthetic knee would not lock and stop motion of lowering himself downward  to reach item.  PT educated pt & wife in set up for safe environment. Pt & wife verbalized understanding.  Pt's wooden box that he had made was 4.3" tall, 18" wide X 10" deep.  PT demo & verbal cues on "riding" hydraulics of prosthetic knee with descending.  Pt's box would not allow box to fit within RW and box began to flip on 1 rep.  Pt performed 3 reps with his box and 2 reps with PT clinic 4" box.  He seems to understand how to engage prosthetic knee.  PT recommended that his box is 16" X 16" on top and pt / wife verbalized understanding including rationale for size.  Pt amb 120' with cane with minA.  PT educated on family assisting with min to CGA with support close to his LUE but not holding constantly.  If his RLE fatigues, then family can provide HHA until he reaches a chair.  Pt & wife verbalized understanding.      PATIENT EDUCATION: PATIENT EDUCATED ON FOLLOWING PROSTHETIC CARE: Education details:   Proper doffing with proper rotation Person educated: Patient and Spouse Education method: Explanation, Demonstration, Tactile cues, and Verbal cues Education comprehension: verbalized understanding and needs further education  HOME EXERCISE PROGRAM: Try to do these exercises 3-5 times per day.   In front of chair with walker in front for safety: Stand up & sit down 5-10 reps without touching walker unless needed for balance. 5-10 reps Ball up piece of paper.  Lower yourself like sitting down to put paper on floor or pick it up.  Raise back upright similar to standing up.  Pick up 5# object using above technique. 5-10 reps  Near counter  top or rail on deck / porch.  Walk forward with left hand near counter looking forward, not staring at floor. 5-10 reps Walk backwards with cane and left hand near counter. 5-10 reps Walk sideways with cane and left hand near counter.  Push off your outside leg.  5-10 reps Use above side stepping to work on turning 90*  push off outside leg and turn other leg to face forward to walk down counter.  When turning to your left you will need a chair back across from counter to touch.    Place 4" step in front of sink or rail and chair back across from it Step up on 4" step with right leg and place prosthetic toes over the edge of step. As soon as prosthetic knee starts to bend step down with Right leg.  Turn around towards counter.  5-10 times   07/02/2023: At counter:  Place a chair at end of counter so left hand will be on counter when you stand up. Work on standing up and walking with less hesitancy upon arising. Turn around towards counter at each end.  On return trip your table should now be on your left side to touch as needed.  Work on this 5-10 times.  Side step with cane & left hand on counter.  Going to right, push off left side when stepping.  Going to left, push off right side.  Progress to turning to walk down counter.  Going to right, push off left side and turn right leg, then move cane, bring prosthesis around using "headlight"   Going to left, push off right side turning prosthesis (hit heel), reach left hand to table and walk forward.   ASSESSMENT:  CLINICAL IMPRESSION: PT instructed patient on improving weight shift over prosthesis  in stance by making note of right lower extremity movement.  He improved with instruction and repetition.  He will need significant repetition to break a habit that has formed over the years.   Patient continues to benefit from skilled PT to maximize function and safety with his MPK prosthesis.    OBJECTIVE IMPAIRMENTS: Abnormal gait, decreased activity  tolerance, decreased balance, decreased endurance, decreased knowledge of condition, decreased knowledge of use of DME, decreased mobility, difficulty walking, decreased ROM, decreased strength, and prosthetic dependency .   ACTIVITY LIMITATIONS: carrying, lifting, standing, stairs, transfers, and locomotion level  PARTICIPATION LIMITATIONS: meal prep, community activity, church, and household mobility  PERSONAL FACTORS: Age, Fitness, Past/current experiences, Time since onset of injury/illness/exacerbation, and 3+ comorbidities: see PMH  are also affecting patient's functional outcome.   REHAB POTENTIAL: Good  CLINICAL DECISION MAKING: Evolving/moderate complexity  EVALUATION COMPLEXITY: Moderate   GOALS: Goals reviewed with patient? Yes  SHORT TERM GOALS: Target date: 06/27/2023  Patient reports able to don prosthesis with proper rotation modified independent. Baseline: SEE OBJECTIVE DATA Goal status: MET 06/25/2023 2.  Patient able to sit to/from stand engaging hydraulics of prosthetic knee from 18" chair without armrests using BUEs.  Baseline: SEE OBJECTIVE DATA Goal status: MET 06/25/2023  3.  Patient able to pick up object from floor without UE support using prosthetic hydraulics with supervision. Baseline: SEE OBJECTIVE DATA Goal status: MET  06/25/2023  4. Patient ambulates 100' with cane & prosthesis with minA. Baseline: SEE OBJECTIVE DATA Goal status: MET  06/25/2023   LONG TERM GOALS: Target date: 08/28/2023  Patient demonstrates & verbalized understanding of prosthetic care to enable safe utilization of prosthesis. Baseline: SEE OBJECTIVE DATA Goal status: Ongoing  07/09/2023  Patient-Specific Activity Scoring >/= 4/10 Baseline: SEE OBJECTIVE DATA Goal status: Ongoing 07/09/2023  Berg Balance >/= 40/56 (increase by >8 points) to indicate lower fall risk Baseline: SEE OBJECTIVE DATA Goal status:  Ongoing  07/09/2023  Patient ambulates 100' with prosthesis and cane  independently Baseline: SEE OBJECTIVE DATA Goal status: Ongoing  07/09/2023  Patient negotiates ramps & curbs with cane & prosthesis with supervision. Baseline: SEE OBJECTIVE DATA Goal status: Ongoing  07/09/2023  Patient negotiates stairs with single rail & prosthesis with supervision.  Baseline: SEE OBJECTIVE DATA Goal status: Ongoing  07/09/2023   PLAN:  PT FREQUENCY: 1x/week  PT DURATION: 90 days  PLANNED INTERVENTIONS: 97164- PT Re-evaluation, 97110-Therapeutic exercises, 97530- Therapeutic activity, 97112- Neuromuscular re-education, [id number]- Self Care, 09811- Gait training, [phone]- Prosthetic training, Patient/Family education, Balance training, and Stair training  PLAN FOR NEXT SESSION: Continue work towards LTGs, gait with cane,  progress step down "riding" hydraulics to stairs with 2 rails, continue to work on prosthetic gait with normal knee movement patterns.   Vladimir Faster, PT, DPT 07/09/2023, 3:39 PM   Date of referral: 05/17/2023 Referring provider: Marcello Fennel, MD Referring diagnosis? Acquired absence of left leg above knee V49.76 & Z89.612 Treatment diagnosis? (if different than referring diagnosis)  Other abnormalities of gait and mobility ICD-10-CM:  R26.89  Unsteadiness on feet ICD-10-CM:  R26.81  Muscle weakness (generalized) ICD-10-CM:  M62.81  Stiffness of left hip, not elsewhere classified M25.652  Abnormal posture R29.3  What was this (referring dx) caused by? Surgery (Type: AKA)  Nature of Condition: Initial Onset (within last 3 months)  reason for referral delivery of new prosthesis with significant change in design & function within last 3 months   Laterality: Lt  Current Functional Measure Score: Gait Velocity & assist with  cane & prosthesis - minA comfortable / self-selected 0.67 ft/sec & fast pace 0.69 ft/sec;  Objective measurements identify impairments when they are compared to normal values, the uninvolved extremity, and prior level of  function.  [x]  Yes  []  No  Objective assessment of functional ability: Moderate functional limitations   Briefly describe symptoms: patient is dependent in gait & balance with new Microprocessor Knee prosthesis  How did symptoms start: amputation due to vascular issues  Average pain intensity:  Last 24 hours: 0  Past week: 0  How often does the pt experience symptoms? Frequently  How much have the symptoms interfered with usual daily activities? Moderately  How has condition changed since care began at this facility? NA - initial visit  In general, how is the patients overall health? Good   BACK PAIN (STarT Back Screening Tool) No

## 2023-07-15 ENCOUNTER — Ambulatory Visit: Admitting: Physical Therapy

## 2023-07-15 ENCOUNTER — Encounter: Payer: Self-pay | Admitting: Physical Therapy

## 2023-07-15 DIAGNOSIS — M25652 Stiffness of left hip, not elsewhere classified: Secondary | ICD-10-CM

## 2023-07-15 DIAGNOSIS — R2689 Other abnormalities of gait and mobility: Secondary | ICD-10-CM | POA: Diagnosis not present

## 2023-07-15 DIAGNOSIS — R293 Abnormal posture: Secondary | ICD-10-CM

## 2023-07-15 DIAGNOSIS — R2681 Unsteadiness on feet: Secondary | ICD-10-CM

## 2023-07-15 DIAGNOSIS — M6281 Muscle weakness (generalized): Secondary | ICD-10-CM | POA: Diagnosis not present

## 2023-07-15 NOTE — Therapy (Signed)
 OUTPATIENT PHYSICAL THERAPY PROSTHETIC TREATMENT   Patient Name: Samuel Carney MRN: [id number] DOB:1946-01-12, 78 y.o., male Today's Date: 07/15/2023  END OF SESSION:  PT End of Session - 07/15/23 0937     Visit Number 7    Number of Visits 14    Date for PT Re-Evaluation 08/28/23    Authorization Type [employer] Medicare dual complete    Authorization Time Period NO AUTH NEEDED, NO COPAY, 10% COINSURANCE    Progress Note Due on Visit 10    PT Start Time 0935    PT Stop Time 1016    PT Time Calculation (min) 41 min    Equipment Utilized During Treatment Gait belt    Activity Tolerance Patient tolerated treatment well    Behavior During Therapy WFL for tasks assessed/performed                   Past Medical History:  Diagnosis Date   Alcohol abuse    Aortic atherosclerosis (HCC)    B12 deficiency    Cirrhosis (HCC)    COPD (chronic obstructive pulmonary disease) (HCC)    Coronary artery disease    GERD (gastroesophageal reflux disease)    Glaucoma    Grade II diastolic dysfunction    History of kidney stones    HLD (hyperlipidemia)    Hx of radiation therapy    Hypertension    Lung mass    Moderate mitral regurgitation    Pneumonia    PVD (peripheral vascular disease) (HCC)    Squamous cell carcinoma of lung, right (HCC) 2019   Past Surgical History:  Procedure Laterality Date   COLONOSCOPY WITH PROPOFOL     COLONOSCOPY WITH PROPOFOL N/A 11/25/2018   Procedure: COLONOSCOPY WITH PROPOFOL;  Surgeon: Deveron Fly, MD;  Location: [facility] ENDOSCOPY;  Service: Endoscopy;  Laterality: N/A;   COLONOSCOPY WITH PROPOFOL N/A 11/29/2021   Procedure: COLONOSCOPY WITH PROPOFOL;  Surgeon: Toledo, Alphonsus Jeans, MD;  Location: [facility] ENDOSCOPY;  Service: Gastroenterology;  Laterality: N/A;   ENDARTERECTOMY FEMORAL Left 07/13/2020   Procedure: ENDARTERECTOMY FEMORAL ( SFA STENT);  Surgeon: Jackquelyn Mass, MD;  Location: [facility] ORS;  Service: Vascular;  Laterality: Left;    ESOPHAGOGASTRODUODENOSCOPY (EGD) WITH PROPOFOL N/A 11/25/2018   Procedure: ESOPHAGOGASTRODUODENOSCOPY (EGD) WITH PROPOFOL;  Surgeon: Deveron Fly, MD;  Location: [facility] ENDOSCOPY;  Service: Endoscopy;  Laterality: N/A;   ESOPHAGOGASTRODUODENOSCOPY (EGD) WITH PROPOFOL N/A 11/29/2021   Procedure: ESOPHAGOGASTRODUODENOSCOPY (EGD) WITH PROPOFOL;  Surgeon: Toledo, Alphonsus Jeans, MD;  Location: [facility] ENDOSCOPY;  Service: Gastroenterology;  Laterality: N/A;   ESOPHAGOGASTRODUODENOSCOPY (EGD) WITH PROPOFOL N/A 12/08/2021   Procedure: ESOPHAGOGASTRODUODENOSCOPY (EGD) WITH PROPOFOL;  Surgeon: Luke Salaam, MD;  Location: [facility] ENDOSCOPY;  Service: Gastroenterology;  Laterality: N/A;   LEG AMPUTATION ABOVE KNEE Left    LOWER EXTREMITY ANGIOGRAPHY Left 06/07/2020   Procedure: LOWER EXTREMITY ANGIOGRAPHY;  Surgeon: Jackquelyn Mass, MD;  Location: [facility] INVASIVE CV LAB;  Service: Cardiovascular;  Laterality: Left;   LOWER EXTREMITY ANGIOGRAPHY Left 01/10/2021   Procedure: LOWER EXTREMITY ANGIOGRAPHY;  Surgeon: Jackquelyn Mass, MD;  Location: [facility] INVASIVE CV LAB;  Service: Cardiovascular;  Laterality: Left;   Patient Active Problem List   Diagnosis Date Noted   UGIB (upper gastrointestinal bleed)    Malnutrition of moderate degree 12/06/2021   Melena 12/05/2021   COPD (chronic obstructive pulmonary disease) (HCC)    Grade II diastolic dysfunction    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), left (HCC)    AKI (  acute kidney injury) (HCC)    Atherosclerosis of artery of extremity with ulceration (HCC) 07/13/2020   Atherosclerosis of native arteries of the extremities with ulceration (HCC) 05/19/2020   Alcoholic cirrhosis of 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) 04/09/2019   Hyperlipidemia, mixed 04/09/2019   Malignant 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) 04/09/2019   Thrombocytopenia (HCC) 04/09/2019   CAD (coronary artery disease) 10/14/2017   Lung cancer (HCC) 10/14/2017   Tobacco abuse 08/14/2017    Benign essential hypertension 09/09/2013    PCP: Dorothey Baseman, MD  REFERRING PROVIDER: Marcello Fennel, MD  ONSET DATE: 05/14/2023 received new Microprocessor Knee  REFERRING DIAG: Acquired absence of left leg above knee V49.76 & Z89.612  THERAPY DIAG:  Other abnormalities of gait and mobility  Unsteadiness on feet  Muscle weakness (generalized)  Stiffness of left hip, not elsewhere classified  Abnormal posture  Rationale for Evaluation and Treatment: Rehabilitation  SUBJECTIVE:   SUBJECTIVE STATEMENT: He is using cane in house more.  He is not fearful of fall as much now. He has been doing the step exercise. He has been working on shifting weight over prosthesis.  Pt accompanied by: significant other  PERTINENT HISTORY: PAD, CAD, angina, malignant neoplasm of right lung, HLD, alcoholic cirrhosis of liver, HTN, COPD   PAIN:  Are you having pain? No  PRECAUTIONS: None  WEIGHT BEARING RESTRICTIONS: No  FALLS: Has patient fallen in last 6 months? No  LIVING ENVIRONMENT: Lives with: lives with their spouse Lives in: House Home Access: Stairs to enter and Ramped entrance Home layout: One level and single step into washer / dryer Stairs: Yes: Internal: 1 steps; none and External: 2 steps; on left going up Has following equipment at home: Single point cane, Walker - 2 wheeled, Crutches, Wheelchair (manual), shower chair, and Ramped entry  OCCUPATION: retired  PLOF: walks independently with prosthesis and RW in community. He uses cane in home.   PATIENT GOALS:  to learn to use new prosthesis to walk in home and community with cane.   OBJECTIVE:  Patient-Specific Activity Scoring Scheme  "0" represents "unable to perform." "10" represents "able to perform at prior level. 0 1 2 3 4 5 6 7 8 9  10 (Date and Score)   Activity Eval     1. Walking in house with cane  1    2. standing balance with ADLs  2    3. stairs 2   4.    5.    Score 1.67    Total score  = sum of the activity scores/number of activities Minimum detectable change (90%CI) for average score = 2 points Minimum detectable change (90%CI) for single activity score = 3 points  COGNITION: Evaluation on 05/30/2023: Overall cognitive status: Within functional limits for tasks assessed  CARDIOVASCULAR RESPONSE: Evaluation on  05/30/2023: Functional activity: gait Pre-activity vitals: HR: 65 SpO2: 97% Post-activity vitals: HR: 86 SpO2: 96% Modified Borg scale for dyspnea: 2: mild shortness of breath  POSTURE: Evaluation on 05/30/2023: weight shift right  LOWER EXTREMITY ROM:  ROM P:passive  A:active Right eval Left eval  Hip flexion    Hip extension  Standing -10*  Hip abduction    Hip adduction    Hip internal rotation    Hip external rotation    Knee flexion    Knee extension    Ankle dorsiflexion    Ankle plantarflexion    Ankle inversion    Ankle eversion     (Blank rows = not  tested)  LOWER EXTREMITY MMT:  MMT Right eval Left eval  Hip flexion    Hip extension    Hip abduction    Hip adduction    Hip internal rotation    Hip external rotation    Knee flexion    Knee extension    Ankle dorsiflexion    Ankle plantarflexion    Ankle inversion    Ankle eversion    At Evaluation all strength testing is grossly seated and functionally standing / gait. (Blank rows = not tested)  TRANSFERS: 06/25/2023:  Patient able to sit to/from stand engaging hydraulics of prosthetic knee from 18" chair without armrests using BUEs  Evaluation on 05/30/2023: Sit to stand: SBA (cues only) using BUEs on seat of 18" chair and stabilizes without UE support. Minor engagement of MPK prosthesis.  Stand to sit: SBA (cues only) using BUEs on seat of 18" chair and stabilizes without UE support. Minor engagement of MPK prosthesis.   FUNCTIONAL TESTs:  06/25/2023:  Patient able to pick up object from floor without UE support using prosthetic hydraulics with  supervision.  Evaluation on 05/30/2023:  Berg Balance Scale: 32/56  GAIT: 06/25/2023:  Patient ambulates 100' with cane & prosthesis with minA.  Evaluation on 05/30/2023: Gait pattern: step through pattern, decreased stance time- Left, decreased hip/knee flexion- Left, antalgic, and trunk flexed Distance walked: 150' Assistive device utilized: with TFA MPK prosthesis Single point cane (minA), Walker - 2 wheeled (modified independent / cues for deviations) Level of assistance: Modified independence, SBA, and Min A Gait velocity: with RW comfortable / self-selected 1.03 ft/sec & fast pace 1.96 ft/sec;  with cane & minA comfortable / self-selected 0.67 ft/sec & fast pace 0.69 ft/sec;  CURRENT PROSTHETIC WEAR ASSESSMENT: 06/25/2023: patient able to don suction ring socket with proper rotation.    Evaluation on 05/30/2023: Patient is independent with: skin check, residual limb care, care of non-amputated limb, prosthetic cleaning, ply sock cleaning, and correct ply sock adjustment Patient is dependent with: proper wear schedule/adjustment Donning prosthesis: SBA / verbal cues Doffing prosthesis: Modified independence Prosthetic wear tolerance: 10-12 hours/day, 7 days/week Prosthetic weight bearing tolerance: 10 minutes Residual limb condition: pt denies any issues.  Prosthetic description: silicon liner with suction ring suspension, Microprocessor Knee (MPK), dynamic response foot, Total Contact Socket with flexible inner socket K code/activity level with prosthetic use: Level 3    TODAY'S TREATMENT:                                                                                                                             DATE:  07/15/2023: Prosthetic Care with Transfemoral Microprocessor Knee (MPK) prosthesis: PT continues with verbal & tactile cues for weight shift over prosthesis in stance.  Patient ambulated 25' X 2 with cane stand-alone tip and HHA min assist. Pt amb 120' with cane  stand-alone tip and HHA min assist with distraction so not 100% focused on his gait.  Pt lifted 5# kettle  bell from / to floor with cane support, carried it 30' around 3 obstacles with minA.  1st rep turning left and 2nd rep turning right around obstacles.  PT demo & verbal cues on picking up pace with quicker & longer steps which also helps to develop memory in the MPK unit.  Pt amb with RW picking up pace for 30' x 2 with CGA.     TREATMENT:                                                                                                                             DATE:  07/09/2023: Prosthetic Care with Transfemoral Microprocessor Knee (MPK) prosthesis: Patient ambulated 25' with cane stand-alone tip and HHA min assist. Stand to sit improved by moving the cane to the left upper extremity and reaching to the chair seat with right upper extremity.  PT noted improved prosthetic knee flexion during his sit down. PT demo and verbal cues on full weight shift over the prosthesis in stance as noted by right lower extremity heel rise prior to advancing right lower extremity.  Patient ambulated 100 feet with rolling walker with focus on full weight shift over the prosthesis in stance, RLE in terminal stance heel rise prior to stepping/swing and right lower extremity step length so that he will make contact with ground passed the prosthetic toe line. Patient ambulated 100' x 2 with HHA /min assist with focus on carryover of above. Worked on being able to turn 90 degrees and initiate gait.  Patient performed 3 reps to the right and 3 reps to the left with cane and hand-held assist.   TREATMENT:                                                                                                                             DATE:  07/02/2023: Prosthetic Care with Transfemoral Microprocessor Knee (MPK) prosthesis: Pt amb 40' with cane with HHA minA.   Worked on ambulating near counter, side stepping and turning.  See  HEP below.     At counter:  Place a chair at end of counter so left hand will be on counter when you stand up. -Work on standing up and walking with less hesitancy upon arising. Turn around towards counter at each end.  On return trip your table should now be on your left side to touch as needed.  Work on this 5-10 times.  -  Side step with cane & left hand on counter.  Going to right, push off left side when stepping.  Going to left, push off right side.  -Progress to turning to walk down counter.  Going to right, push off left side and turn right leg, then move cane, bring prosthesis around using "headlight"   Going to left, push off right side turning prosthesis (hit heel), reach left hand to table and walk forward.       PATIENT EDUCATION: PATIENT EDUCATED ON FOLLOWING PROSTHETIC CARE: Education details:   Proper doffing with proper rotation Person educated: Patient and Spouse Education method: Explanation, Demonstration, Tactile cues, and Verbal cues Education comprehension: verbalized understanding and needs further education  HOME EXERCISE PROGRAM: Try to do these exercises 3-5 times per day.   In front of chair with walker in front for safety: Stand up & sit down 5-10 reps without touching walker unless needed for balance. 5-10 reps Ball up piece of paper.  Lower yourself like sitting down to put paper on floor or pick it up.  Raise back upright similar to standing up.  Pick up 5# object using above technique. 5-10 reps  Near counter top or rail on deck / porch.  Walk forward with left hand near counter looking forward, not staring at floor. 5-10 reps Walk backwards with cane and left hand near counter. 5-10 reps Walk sideways with cane and left hand near counter.  Push off your outside leg.  5-10 reps Use above side stepping to work on turning 90*  push off outside leg and turn other leg to face forward to walk down counter.  When turning to your left you will need a chair back  across from counter to touch.    Place 4" step in front of sink or rail and chair back across from it Step up on 4" step with right leg and place prosthetic toes over the edge of step. As soon as prosthetic knee starts to bend step down with Right leg.  Turn around towards counter.  5-10 times   07/02/2023: At counter:  Place a chair at end of counter so left hand will be on counter when you stand up. Work on standing up and walking with less hesitancy upon arising. Turn around towards counter at each end.  On return trip your table should now be on your left side to touch as needed.  Work on this 5-10 times.  Side step with cane & left hand on counter.  Going to right, push off left side when stepping.  Going to left, push off right side.  Progress to turning to walk down counter.  Going to right, push off left side and turn right leg, then move cane, bring prosthesis around using "headlight"   Going to left, push off right side turning prosthesis (hit heel), reach left hand to table and walk forward.   ASSESSMENT:  CLINICAL IMPRESSION: Patient was challenged by progression of gait activities today.  He improved with PT instruction and repetition.   Patient continues to benefit from skilled PT to maximize function and safety with his MPK prosthesis.    OBJECTIVE IMPAIRMENTS: Abnormal gait, decreased activity tolerance, decreased balance, decreased endurance, decreased knowledge of condition, decreased knowledge of use of DME, decreased mobility, difficulty walking, decreased ROM, decreased strength, and prosthetic dependency .   ACTIVITY LIMITATIONS: carrying, lifting, standing, stairs, transfers, and locomotion level  PARTICIPATION LIMITATIONS: meal prep, community activity, church, and household mobility  PERSONAL FACTORS: Age,  Fitness, Past/current experiences, Time since onset of injury/illness/exacerbation, and 3+ comorbidities: see PMH  are also affecting patient's functional outcome.    REHAB POTENTIAL: Good  CLINICAL DECISION MAKING: Evolving/moderate complexity  EVALUATION COMPLEXITY: Moderate   GOALS: Goals reviewed with patient? Yes  SHORT TERM GOALS: Target date: 06/27/2023  Patient reports able to don prosthesis with proper rotation modified independent. Baseline: SEE OBJECTIVE DATA Goal status: MET 06/25/2023 2.  Patient able to sit to/from stand engaging hydraulics of prosthetic knee from 18" chair without armrests using BUEs.  Baseline: SEE OBJECTIVE DATA Goal status: MET 06/25/2023  3.  Patient able to pick up object from floor without UE support using prosthetic hydraulics with supervision. Baseline: SEE OBJECTIVE DATA Goal status: MET  06/25/2023  4. Patient ambulates 100' with cane & prosthesis with minA. Baseline: SEE OBJECTIVE DATA Goal status: MET  06/25/2023   LONG TERM GOALS: Target date: 08/28/2023  Patient demonstrates & verbalized understanding of prosthetic care to enable safe utilization of prosthesis. Baseline: SEE OBJECTIVE DATA Goal status: Ongoing  07/15/2023  Patient-Specific Activity Scoring >/= 4/10 Baseline: SEE OBJECTIVE DATA Goal status: Ongoing 07/15/2023  Berg Balance >/= 40/56 (increase by >8 points) to indicate lower fall risk Baseline: SEE OBJECTIVE DATA Goal status:  Ongoing  07/15/2023  Patient ambulates 100' with prosthesis and cane independently Baseline: SEE OBJECTIVE DATA Goal status: Ongoing  07/15/2023  Patient negotiates ramps & curbs with cane & prosthesis with supervision. Baseline: SEE OBJECTIVE DATA Goal status: Ongoing  07/15/2023  Patient negotiates stairs with single rail & prosthesis with supervision.  Baseline: SEE OBJECTIVE DATA Goal status: Ongoing  07/15/2023   PLAN:  PT FREQUENCY: 1x/week  PT DURATION: 90 days  PLANNED INTERVENTIONS: 97164- PT Re-evaluation, 97110-Therapeutic exercises, 97530- Therapeutic activity, 97112- Neuromuscular re-education, [phone]- Self Care, 60454- Gait  training, [phone]- Prosthetic training, Patient/Family education, Balance training, and Stair training  PLAN FOR NEXT SESSION:    Continue work towards LTGs, gait with cane,  continue to work on prosthetic gait with normal knee movement patterns.   Vladimir Faster, PT, DPT 07/15/2023, 10:31 AM   Date of referral: 05/17/2023 Referring provider: Marcello Fennel, MD Referring diagnosis? Acquired absence of left leg above knee V49.76 & Z89.612 Treatment diagnosis? (if different than referring diagnosis)  Other abnormalities of gait and mobility ICD-10-CM:  R26.89  Unsteadiness on feet ICD-10-CM:  R26.81  Muscle weakness (generalized) ICD-10-CM:  M62.81  Stiffness of left hip, not elsewhere classified M25.652  Abnormal posture R29.3  What was this (referring dx) caused by? Surgery (Type: AKA)  Nature of Condition: Initial Onset (within last 3 months)  reason for referral delivery of new prosthesis with significant change in design & function within last 3 months   Laterality: Lt  Current Functional Measure Score: Gait Velocity & assist with cane & prosthesis - minA comfortable / self-selected 0.67 ft/sec & fast pace 0.69 ft/sec;  Objective measurements identify impairments when they are compared to normal values, the uninvolved extremity, and prior level of function.  [x]  Yes  []  No  Objective assessment of functional ability: Moderate functional limitations   Briefly describe symptoms: patient is dependent in gait & balance with new Microprocessor Knee prosthesis  How did symptoms start: amputation due to vascular issues  Average pain intensity:  Last 24 hours: 0  Past week: 0  How often does the pt experience symptoms? Frequently  How much have the symptoms interfered with usual daily activities? Moderately  How has condition changed since care began at this  facility? NA - initial visit  In general, how is the patients overall health? Good   BACK PAIN (STarT Back  Screening Tool) No

## 2023-07-16 ENCOUNTER — Encounter: Payer: 59 | Admitting: Physical Therapy

## 2023-07-23 ENCOUNTER — Ambulatory Visit (INDEPENDENT_AMBULATORY_CARE_PROVIDER_SITE_OTHER): Payer: 59 | Admitting: Physical Therapy

## 2023-07-23 ENCOUNTER — Encounter: Payer: Self-pay | Admitting: Physical Therapy

## 2023-07-23 DIAGNOSIS — R2689 Other abnormalities of gait and mobility: Secondary | ICD-10-CM | POA: Diagnosis not present

## 2023-07-23 DIAGNOSIS — M25652 Stiffness of left hip, not elsewhere classified: Secondary | ICD-10-CM | POA: Diagnosis not present

## 2023-07-23 DIAGNOSIS — R293 Abnormal posture: Secondary | ICD-10-CM

## 2023-07-23 DIAGNOSIS — R2681 Unsteadiness on feet: Secondary | ICD-10-CM | POA: Diagnosis not present

## 2023-07-23 DIAGNOSIS — M6281 Muscle weakness (generalized): Secondary | ICD-10-CM | POA: Diagnosis not present

## 2023-07-23 NOTE — Therapy (Signed)
 OUTPATIENT PHYSICAL THERAPY PROSTHETIC TREATMENT   Patient Name: Samuel Carney MRN: [id number] DOB:01-13-1946, 78 y.o., male Today's Date: 07/23/2023  END OF SESSION:  PT End of Session - 07/23/23 0933     Visit Number 8    Number of Visits 14    Date for PT Re-Evaluation 08/28/23    Authorization Type [employer] Medicare dual complete    Authorization Time Period NO AUTH NEEDED, NO COPAY, 10% COINSURANCE    Progress Note Due on Visit 10    PT Start Time 0930    PT Stop Time 1010    PT Time Calculation (min) 40 min    Equipment Utilized During Treatment Gait belt    Activity Tolerance Patient tolerated treatment well    Behavior During Therapy WFL for tasks assessed/performed                    Past Medical History:  Diagnosis Date   Alcohol abuse    Aortic atherosclerosis (HCC)    B12 deficiency    Cirrhosis (HCC)    COPD (chronic obstructive pulmonary disease) (HCC)    Coronary artery disease    GERD (gastroesophageal reflux disease)    Glaucoma    Grade II diastolic dysfunction    History of kidney stones    HLD (hyperlipidemia)    Hx of radiation therapy    Hypertension    Lung mass    Moderate mitral regurgitation    Pneumonia    PVD (peripheral vascular disease) (HCC)    Squamous cell carcinoma of lung, right (HCC) 2019   Past Surgical History:  Procedure Laterality Date   COLONOSCOPY WITH PROPOFOL      COLONOSCOPY WITH PROPOFOL  N/A 11/25/2018   Procedure: COLONOSCOPY WITH PROPOFOL ;  Surgeon: Deveron Fly, MD;  Location: [facility];  Service: Endoscopy;  Laterality: N/A;   COLONOSCOPY WITH PROPOFOL  N/A 11/29/2021   Procedure: COLONOSCOPY WITH PROPOFOL ;  Surgeon: Toledo, Alphonsus Jeans, MD;  Location: [facility] END[facility]COPY;  Service: Gastroenterology;  Laterality: N/A;   ENDARTERECTOMY FEMORAL Left 07/13/2020   Procedure: ENDARTERECTOMY FEMORAL ( SFA STENT);  Surgeon: Jackquelyn Mass, MD;  Location: [facility] OR[facility];  Service: Vascular;  Laterality: Left;    ESOPHAGOGASTRODUODENOSCOPY (EGD) WITH PROPOFOL  N/A 11/25/2018   Procedure: ESOPHAGOGASTRODUODENOSCOPY (EGD) WITH PROPOFOL ;  Surgeon: Deveron Fly, MD;  Location: [facility] ENDOSCOPY;  Service: Endoscopy;  Laterality: N/A;   ESOPHAGOGASTRODUODENOSCOPY (EGD) WITH PROPOFOL  N/A 11/29/2021   Procedure: ESOPHAGOGASTRODUODENOSCOPY (EGD) WITH PROPOFOL ;  Surgeon: Toledo, Alphonsus Jeans, MD;  Location: [facility] ENDOSCOPY;  Service: Gastroenterology;  Laterality: N/A;   ESOPHAGOGASTRODUODENOSCOPY (EGD) WITH PROPOFOL  N/A 12/08/2021   Procedure: ESOPHAGOGASTRODUODENOSCOPY (EGD) WITH PROPOFOL ;  Surgeon: Luke Salaam, MD;  Location: [facility] ENDOSCOPY;  Service: Gastroenterology;  Laterality: N/A;   LEG AMPUTATION ABOVE KNEE Left    LOWER EXTREMITY ANGIOGRAPHY Left 06/07/2020   Procedure: LOWER EXTREMITY ANGIOGRAPHY;  Surgeon: Jackquelyn Mass, MD;  Location: [facility] INV[facility]IVE [facility];  Service: Cardiovascular;  Laterality: Left;   LOWER EXTREMITY ANGIOGRAPHY Left 01/10/2021   Procedure: LOWER EXTREMITY ANGIOGRAPHY;  Surgeon: Jackquelyn Mass, MD;  Location: [facility];  Service: Cardiovascular;  Laterality: Left;   Patient Active Problem List   Diagnosis Date Noted   UGIB (upper gastrointestinal bleed)    Malnutrition of moderate degree 12/06/2021   Melena 12/05/2021   COPD (chronic obstructive pulmonary disease) (HCC)    Grade II diastolic dysfunction    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), left (HCC)  AKI (acute kidney injury) (HCC)    Atherosclerosis of artery of extremity with ulceration (HCC) 07/13/2020   Atherosclerosis of native arteries of the extremities with ulceration (HCC) 05/19/2020   Alcoholic cirrhosis of 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) 04/09/2019   Hyperlipidemia, mixed 04/09/2019   Malignant 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) 04/09/2019   Thrombocytopenia (HCC) 04/09/2019   CAD (coronary artery disease) 10/14/2017   Lung cancer (HCC) 10/14/2017   Tobacco abuse 08/14/2017    Benign essential hypertension 09/09/2013    PCP: Rory Collard, MD  REFERRING PROVIDER: Lydia Sams, MD  ONSET DATE: 05/14/2023 received new Microprocessor Knee  REFERRING DIAG: Acquired absence of left leg above knee V49.76 & Z89.612  THERAPY DIAG:  Other abnormalities of gait and mobility  Unsteadiness on feet  Muscle weakness (generalized)  Stiffness of left hip, not elsewhere classified  Abnormal posture  Rationale for Evaluation and Treatment: Rehabilitation  SUBJECTIVE:   SUBJECTIVE STATEMENT: He went to [place] cookout and walked on paved surfaces.  He is using the cane in house which going well.  He reports no falls or near falls.   Pt accompanied by: significant other  PERTINENT HISTORY: PAD, CAD, angina, malignant neoplasm of right lung, HLD, alcoholic cirrhosis of liver, HTN, COPD   PAIN:  Are you having pain? No  PRECAUTIONS: None  WEIGHT BEARING RESTRICTIONS: No  FALLS: Has patient fallen in last 6 months? No  LIVING ENVIRONMENT: Lives with: lives with their spouse Lives in: House Home Access: Stairs to enter and Ramped entrance Home layout: One level and single step into washer / dryer Stairs: Yes: Internal: 1 steps; none and External: 2 steps; on left going up Has following equipment at home: Single point cane, Walker - 2 wheeled, Crutches, Wheelchair (manual), shower chair, and Ramped entry  OCCUPATION: retired  PLOF: walks independently with prosthesis and RW in community. He uses cane in home.   PATIENT GOALS:  to learn to use new prosthesis to walk in home and community with cane.   OBJECTIVE:  Patient-Specific Activity Scoring Scheme  "0" represents "unable to perform." "10" represents "able to perform at prior level. 0 1 2 3 4 5 6 7 8 9  10 (Date and Score)   Activity Eval     1. Walking in house with cane  1    2. standing balance with ADLs  2    3. stairs 2   4.    5.    Score 1.67    Total score = sum of the activity  scores/number of activities Minimum detectable change (90%CI) for average score = 2 points Minimum detectable change (90%CI) for single activity score = 3 points  COGNITION: Evaluation on 05/30/2023: Overall cognitive status: Within functional limits for tasks assessed  CARDIOVASCULAR RESPONSE: Evaluation on  05/30/2023: Functional activity: gait Pre-activity vitals: HR: 65 SpO2: 97% Post-activity vitals: HR: 86 SpO2: 96% Modified Borg scale for dyspnea: 2: mild shortness of breath  POSTURE: Evaluation on 05/30/2023: weight shift right  LOWER EXTREMITY ROM:  ROM P:passive  A:active Right eval Left eval  Hip flexion    Hip extension  Standing -10*  Hip abduction    Hip adduction    Hip internal rotation    Hip external rotation    Knee flexion    Knee extension    Ankle dorsiflexion    Ankle plantarflexion    Ankle inversion    Ankle eversion     (Blank rows = not tested)  LOWER EXTREMITY MMT:  MMT Right eval Left eval  Hip flexion    Hip extension    Hip abduction    Hip adduction    Hip internal rotation    Hip external rotation    Knee flexion    Knee extension    Ankle dorsiflexion    Ankle plantarflexion    Ankle inversion    Ankle eversion    At Evaluation all strength testing is grossly seated and functionally standing / gait. (Blank rows = not tested)  TRANSFERS: 06/25/2023:  Patient able to sit to/from stand engaging hydraulics of prosthetic knee from 18" chair without armrests using BUEs  Evaluation on 05/30/2023: Sit to stand: SBA (cues only) using BUEs on seat of 18" chair and stabilizes without UE support. Minor engagement of MPK prosthesis.  Stand to sit: SBA (cues only) using BUEs on seat of 18" chair and stabilizes without UE support. Minor engagement of MPK prosthesis.   FUNCTIONAL TESTs:  06/25/2023:  Patient able to pick up object from floor without UE support using prosthetic hydraulics with supervision.  Evaluation on 05/30/2023:   Berg Balance Scale: 32/56  GAIT: 06/25/2023:  Patient ambulates 100' with cane & prosthesis with minA.  Evaluation on 05/30/2023: Gait pattern: step through pattern, decreased stance time- Left, decreased hip/knee flexion- Left, antalgic, and trunk flexed Distance walked: 150' Assistive device utilized: with TFA MPK prosthesis Single point cane (minA), Walker - 2 wheeled (modified independent / cues for deviations) Level of assistance: Modified independence, SBA, and Min A Gait velocity: with RW comfortable / self-selected 1.03 ft/sec & fast pace 1.96 ft/sec;  with cane & minA comfortable / self-selected 0.67 ft/sec & fast pace 0.69 ft/sec;  CURRENT PROSTHETIC WEAR ASSESSMENT: 06/25/2023: patient able to don suction ring socket with proper rotation.    Evaluation on 05/30/2023: Patient is independent with: skin check, residual limb care, care of non-amputated limb, prosthetic cleaning, ply sock cleaning, and correct ply sock adjustment Patient is dependent with: proper wear schedule/adjustment Donning prosthesis: SBA / verbal cues Doffing prosthesis: Modified independence Prosthetic wear tolerance: 10-12 hours/day, 7 days/week Prosthetic weight bearing tolerance: 10 minutes Residual limb condition: pt denies any issues.  Prosthetic description: silicon liner with suction ring suspension, Microprocessor Knee (MPK), dynamic response foot, Total Contact Socket with flexible inner socket K code/activity level with prosthetic use: Level 3    TODAY'S  TREATMENT:                                                                                                                             DATE:  07/23/2023: Prosthetic Care with Transfemoral Microprocessor Knee (MPK) prosthesis: PT reviewed benefits to using rollator vs std RW including easier movement with grass or gravel surfaces, able to transport items and resting surface if walks further than he is able.  Pt & wife verbalized understanding.   PT  instructed in changing shoes on prosthesis with same heel height as changing ht can  effect prosthetic knee.   Pt amb 150' X 2 with cane stand alone tip working on scanning right/left/up maintaining path.   PT demo & verbal cues on picking up pace. Pt performed with RW for 3 steps on prosthesis.      TREATMENT:                                                                                                                             DATE:  07/15/2023: Prosthetic Care with Transfemoral Microprocessor Knee (MPK) prosthesis: PT continues with verbal & tactile cues for weight shift over prosthesis in stance.  Patient ambulated 25' X 2 with cane stand-alone tip and HHA min assist. Pt amb 120' with cane stand-alone tip and HHA min assist with distraction so not 100% focused on his gait.  Pt lifted 5# kettle bell from / to floor with cane support, carried it 30' around 3 obstacles with minA.  1st rep turning left and 2nd rep turning right around obstacles.  PT demo & verbal cues on picking up pace with quicker & longer steps which also helps to develop memory in the MPK unit.  Pt amb with RW picking up pace for 30' x 2 with CGA.     TREATMENT:                                                                                                                             DATE:  07/09/2023: Prosthetic Care with Transfemoral Microprocessor Knee (MPK) prosthesis: Patient ambulated 25' with cane stand-alone tip and HHA min assist. Stand to sit improved by moving the cane to the left upper extremity and reaching to the chair seat with right upper extremity.  PT noted improved prosthetic knee flexion during his sit down. PT demo and verbal cues on full weight shift over the prosthesis in stance as noted by right lower extremity heel rise prior to advancing right lower extremity.  Patient ambulated 100 feet with rolling walker with focus on full weight shift over the prosthesis in stance, RLE in terminal stance heel  rise prior to stepping/swing and right lower extremity step length so that he will make contact with ground passed the prosthetic toe line. Patient ambulated 100' x 2 with HHA /min assist with focus on carryover of above. Worked on being able to turn 90 degrees and initiate gait.  Patient performed 3 reps to the right and 3 reps  to the left with cane and hand-held assist.      PATIENT EDUCATION: PATIENT EDUCATED ON FOLLOWING PROSTHETIC CARE: Education details:   Proper doffing with proper rotation Person educated: Patient and Spouse Education method: Explanation, Demonstration, Tactile cues, and Verbal cues Education comprehension: verbalized understanding and needs further education  HOME EXERCISE PROGRAM: Try to do these exercises 3-5 times per day.   In front of chair with walker in front for safety: Stand up & sit down 5-10 reps without touching walker unless needed for balance. 5-10 reps Ball up piece of paper.  Lower yourself like sitting down to put paper on floor or pick it up.  Raise back upright similar to standing up.  Pick up 5# object using above technique. 5-10 reps  Near counter top or rail on deck / porch.  Walk forward with left hand near counter looking forward, not staring at floor. 5-10 reps Walk backwards with cane and left hand near counter. 5-10 reps Walk sideways with cane and left hand near counter.  Push off your outside leg.  5-10 reps Use above side stepping to work on turning 90*  push off outside leg and turn other leg to face forward to walk down counter.  When turning to your left you will need a chair back across from counter to touch.    Place 4" step in front of sink or rail and chair back across from it Step up on 4" step with right leg and place prosthetic toes over the edge of step. As soon as prosthetic knee starts to bend step down with Right leg.  Turn around towards counter.  5-10 times   07/02/2023: At counter:  Place a chair at end of  counter so left hand will be on counter when you stand up. Work on standing up and walking with less hesitancy upon arising. Turn around towards counter at each end.  On return trip your table should now be on your left side to touch as needed.  Work on this 5-10 times.  Side step with cane & left hand on counter.  Going to right, push off left side when stepping.  Going to left, push off right side.  Progress to turning to walk down counter.  Going to right, push off left side and turn right leg, then move cane, bring prosthesis around using "headlight"   Going to left, push off right side turning prosthesis (hit heel), reach left hand to table and walk forward.   ASSESSMENT:  CLINICAL IMPRESSION: PT worked on scanning while maintaining path & pace with cane.  PT also worked on Conservation officer, historic buildings RW initially.   Patient continues to benefit from skilled PT to maximize function and safety with his MPK prosthesis.    OBJECTIVE IMPAIRMENTS: Abnormal gait, decreased activity tolerance, decreased balance, decreased endurance, decreased knowledge of condition, decreased knowledge of use of DME, decreased mobility, difficulty walking, decreased ROM, decreased strength, and prosthetic dependency .   ACTIVITY LIMITATIONS: carrying, lifting, standing, stairs, transfers, and locomotion level  PARTICIPATION LIMITATIONS: meal prep, community activity, church, and household mobility  PERSONAL FACTORS: Age, Fitness, Past/current experiences, Time since onset of injury/illness/exacerbation, and 3+ comorbidities: see PMH  are also affecting patient's functional outcome.   REHAB POTENTIAL: Good  CLINICAL DECISION MAKING: Evolving/moderate complexity  EVALUATION COMPLEXITY: Moderate   GOALS: Goals reviewed with patient? Yes  SHORT TERM GOALS: Target date: 06/27/2023  Patient reports able to don prosthesis with proper rotation modified independent. Baseline: SEE OBJECTIVE  DATA Goal status: MET  06/25/2023 2.  Patient able to sit to/from stand engaging hydraulics of prosthetic knee from 18" chair without armrests using BUEs.  Baseline: SEE OBJECTIVE DATA Goal status: MET 06/25/2023  3.  Patient able to pick up object from floor without UE support using prosthetic hydraulics with supervision. Baseline: SEE OBJECTIVE DATA Goal status: MET  06/25/2023  4. Patient ambulates 100' with cane & prosthesis with minA. Baseline: SEE OBJECTIVE DATA Goal status: MET  06/25/2023   LONG TERM GOALS: Target date: 08/28/2023  Patient demonstrates & verbalized understanding of prosthetic care to enable safe utilization of prosthesis. Baseline: SEE OBJECTIVE DATA Goal status: Ongoing   07/23/2023  Patient-Specific Activity Scoring >/= 4/10 Baseline: SEE OBJECTIVE DATA Goal status: Ongoing  07/23/2023  Berg Balance >/= 40/56 (increase by >8 points) to indicate lower fall risk Baseline: SEE OBJECTIVE DATA Goal status:  Ongoing   07/23/2023  Patient ambulates 100' with prosthesis and cane independently Baseline: SEE OBJECTIVE DATA Goal status: Ongoing  07/23/2023  Patient negotiates ramps & curbs with cane & prosthesis with supervision. Baseline: SEE OBJECTIVE DATA Goal status: Ongoing  07/23/2023  Patient negotiates stairs with single rail & prosthesis with supervision.  Baseline: SEE OBJECTIVE DATA Goal status: Ongoing  07/23/2023   PLAN:  PT FREQUENCY: 1x/week  PT DURATION: 90 days  PLANNED INTERVENTIONS: 97164- PT Re-evaluation, 97110-Therapeutic exercises, 97530- Therapeutic activity, 97112- Neuromuscular re-education, [phone]- Self Care, 24401- Gait training, [phone]- Prosthetic training, Patient/Family education, Balance training, and Stair training  PLAN FOR NEXT SESSION:   Continue work towards LTGs, gait with cane,  continue to work on prosthetic gait with normal knee movement patterns.   Lorie Rook, PT, DPT 07/23/2023, 1:39 PM   Date of referral: 05/17/2023 Referring provider:  Lydia Sams, MD Referring diagnosis? Acquired absence of left leg above knee V49.76 & Z89.612 Treatment diagnosis? (if different than referring diagnosis)  Other abnormalities of gait and mobility ICD-10-CM:  R26.89  Unsteadiness on feet ICD-10-CM:  R26.81  Muscle weakness (generalized) ICD-10-CM:  M62.81  Stiffness of left hip, not elsewhere classified M25.652  Abnormal posture R29.3  What was this (referring dx) caused by? Surgery (Type: AKA)  Nature of Condition: Initial Onset (within last 3 months)  reason for referral delivery of new prosthesis with significant change in design & function within last 3 months   Laterality: Lt  Current Functional Measure Score: Gait Velocity & assist with cane & prosthesis - minA comfortable / self-selected 0.67 ft/sec & fast pace 0.69 ft/sec;  Objective measurements identify impairments when they are compared to normal values, the uninvolved extremity, and prior level of function.  [x]  Yes  []  No  Objective assessment of functional ability: Moderate functional limitations   Briefly describe symptoms: patient is dependent in gait & balance with new Microprocessor Knee prosthesis  How did symptoms start: amputation due to vascular issues  Average pain intensity:  Last 24 hours: 0  Past week: 0  How often does the pt experience symptoms? Frequently  How much have the symptoms interfered with usual daily activities? Moderately  How has condition changed since care began at this facility? NA - initial visit  In general, how is the patients overall health? Good   BACK PAIN (STarT Back Screening Tool) No

## 2023-07-29 ENCOUNTER — Encounter: Admitting: Physical Therapy

## 2023-07-30 ENCOUNTER — Encounter: Payer: 59 | Admitting: Physical Therapy

## 2023-07-31 ENCOUNTER — Other Ambulatory Visit: Payer: Self-pay | Admitting: Gastroenterology

## 2023-07-31 DIAGNOSIS — K7030 Alcoholic cirrhosis of liver without ascites: Secondary | ICD-10-CM

## 2023-08-01 ENCOUNTER — Encounter: Payer: Self-pay | Admitting: Physical Therapy

## 2023-08-01 ENCOUNTER — Ambulatory Visit (INDEPENDENT_AMBULATORY_CARE_PROVIDER_SITE_OTHER): Admitting: Physical Therapy

## 2023-08-01 DIAGNOSIS — R2681 Unsteadiness on feet: Secondary | ICD-10-CM

## 2023-08-01 DIAGNOSIS — R2689 Other abnormalities of gait and mobility: Secondary | ICD-10-CM

## 2023-08-01 DIAGNOSIS — M25652 Stiffness of left hip, not elsewhere classified: Secondary | ICD-10-CM | POA: Diagnosis not present

## 2023-08-01 DIAGNOSIS — R293 Abnormal posture: Secondary | ICD-10-CM

## 2023-08-01 DIAGNOSIS — M6281 Muscle weakness (generalized): Secondary | ICD-10-CM

## 2023-08-01 NOTE — Therapy (Signed)
 OUTPATIENT PHYSICAL THERAPY PROSTHETIC TREATMENT   Patient Name: Samuel Carney MRN: [id number] DOB:04/13/1945, 78 y.o., male Today's Date: 08/01/2023  END OF SESSION:  PT End of Session - 08/01/23 1148     Visit Number 9    Number of Visits 14    Date for PT Re-Evaluation 08/28/23    Authorization Type [employer] Medicare dual complete    Authorization Time Period NO AUTH NEEDED, NO COPAY, 10% COINSURANCE    Progress Note Due on Visit 10    PT Start Time 1148    PT Stop Time 1226    PT Time Calculation (min) 38 min    Equipment Utilized During Treatment Gait belt    Activity Tolerance Patient tolerated treatment well    Behavior During Therapy WFL for tasks assessed/performed                     Past Medical History:  Diagnosis Date   Alcohol abuse    Aortic atherosclerosis (HCC)    B12 deficiency    Cirrhosis (HCC)    COPD (chronic obstructive pulmonary disease) (HCC)    Coronary artery disease    GERD (gastroesophageal reflux disease)    Glaucoma    Grade II diastolic dysfunction    History of kidney stones    HLD (hyperlipidemia)    Hx of radiation therapy    Hypertension    Lung mass    Moderate mitral regurgitation    Pneumonia    PVD (peripheral vascular disease) (HCC)    Squamous cell carcinoma of lung, right (HCC) 2019   Past Surgical History:  Procedure Laterality Date   COLONOSCOPY WITH PROPOFOL      COLONOSCOPY WITH PROPOFOL  N/A 11/25/2018   Procedure: COLONOSCOPY WITH PROPOFOL ;  Surgeon: Deveron Fly, MD;  Location: [facility] ENDOSCOPY;  Service: Endoscopy;  Laterality: N/A;   COLONOSCOPY WITH PROPOFOL  N/A 11/29/2021   Procedure: COLONOSCOPY WITH PROPOFOL ;  Surgeon: Toledo, Alphonsus Jeans, MD;  Location: [facility] ENDOSCOPY;  Service: Gastroenterology;  Laterality: N/A;   ENDARTERECTOMY FEMORAL Left 07/13/2020   Procedure: ENDARTERECTOMY FEMORAL ( SFA STENT);  Surgeon: Jackquelyn Mass, MD;  Location: [facility] ORS;  Service: Vascular;  Laterality: Left;    ESOPHAGOGASTRODUODENOSCOPY (EGD) WITH PROPOFOL  N/A 11/25/2018   Procedure: ESOPHAGOGASTRODUODENOSCOPY (EGD) WITH PROPOFOL ;  Surgeon: Deveron Fly, MD;  Location: [facility] ENDOSCOPY;  Service: Endoscopy;  Laterality: N/A;   ESOPHAGOGASTRODUODENOSCOPY (EGD) WITH PROPOFOL  N/A 11/29/2021   Procedure: ESOPHAGOGASTRODUODENOSCOPY (EGD) WITH PROPOFOL ;  Surgeon: Toledo, Alphonsus Jeans, MD;  Location: [facility] ENDOSCOPY;  Service: Gastroenterology;  Laterality: N/A;   ESOPHAGOGASTRODUODENOSCOPY (EGD) WITH PROPOFOL  N/A 12/08/2021   Procedure: ESOPHAGOGASTRODUODENOSCOPY (EGD) WITH PROPOFOL ;  Surgeon: Luke Salaam, MD;  Location: [facility] ENDOSCOPY;  Service: Gastroenterology;  Laterality: N/A;   LEG AMPUTATION ABOVE KNEE Left    LOWER EXTREMITY ANGIOGRAPHY Left 06/07/2020   Procedure: LOWER EXTREMITY ANGIOGRAPHY;  Surgeon: Jackquelyn Mass, MD;  Location: [facility] INVASIVE CV LAB;  Service: Cardiovascular;  Laterality: Left;   LOWER EXTREMITY ANGIOGRAPHY Left 01/10/2021   Procedure: LOWER EXTREMITY ANGIOGRAPHY;  Surgeon: Jackquelyn Mass, MD;  Location: [facility] INVASIVE CV LAB;  Service: Cardiovascular;  Laterality: Left;   Patient Active Problem List   Diagnosis Date Noted   UGIB (upper gastrointestinal bleed)    Malnutrition of moderate degree 12/06/2021   Melena 12/05/2021   COPD (chronic obstructive pulmonary disease) (HCC)    Grade II diastolic dysfunction    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), left (HCC)  AKI (acute kidney injury) (HCC)    Atherosclerosis of artery of extremity with ulceration (HCC) 07/13/2020   Atherosclerosis of native arteries of the extremities with ulceration (HCC) 05/19/2020   Alcoholic cirrhosis of 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) 04/09/2019   Hyperlipidemia, mixed 04/09/2019   Malignant 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) 04/09/2019   Thrombocytopenia (HCC) 04/09/2019   CAD (coronary artery disease) 10/14/2017   Lung cancer (HCC) 10/14/2017   Tobacco abuse 08/14/2017    Benign essential hypertension 09/09/2013    PCP: Rory Collard, MD  REFERRING PROVIDER: Lydia Sams, MD  ONSET DATE: 05/14/2023 received new Microprocessor Knee  REFERRING DIAG: Acquired absence of left leg above knee V49.76 & Z89.612  THERAPY DIAG:  Other abnormalities of gait and mobility  Unsteadiness on feet  Muscle weakness (generalized)  Stiffness of left hip, not elsewhere classified  Abnormal posture  Rationale for Evaluation and Treatment: Rehabilitation  SUBJECTIVE:   SUBJECTIVE STATEMENT: He has been doing exercises PT recommended.  No falls.  He saw prosthetist (Amy) who made adjustments which helped.   Pt accompanied by: significant other  PERTINENT HISTORY: PAD, CAD, angina, malignant neoplasm of right lung, HLD, alcoholic cirrhosis of liver, HTN, COPD   PAIN:  Are you having pain? No  PRECAUTIONS: None  WEIGHT BEARING RESTRICTIONS: No  FALLS: Has patient fallen in last 6 months? No  LIVING ENVIRONMENT: Lives with: lives with their spouse Lives in: House Home Access: Stairs to enter and Ramped entrance Home layout: One level and single step into washer / dryer Stairs: Yes: Internal: 1 steps; none and External: 2 steps; on left going up Has following equipment at home: Single point cane, Walker - 2 wheeled, Crutches, Wheelchair (manual), shower chair, and Ramped entry  OCCUPATION: retired  PLOF: walks independently with prosthesis and RW in community. He uses cane in home.   PATIENT GOALS:  to learn to use new prosthesis to walk in home and community with cane.   OBJECTIVE:  Patient-Specific Activity Scoring Scheme  "0" represents "unable to perform." "10" represents "able to perform at prior level. 0 1 2 3 4 5 6 7 8 9  10 (Date and Score)   Activity Eval     1. Walking in house with cane  1    2. standing balance with ADLs  2    3. stairs 2   4.    5.    Score 1.67    Total score = sum of the activity scores/number of  activities Minimum detectable change (90%CI) for average score = 2 points Minimum detectable change (90%CI) for single activity score = 3 points  COGNITION: Evaluation on 05/30/2023: Overall cognitive status: Within functional limits for tasks assessed  CARDIOVASCULAR RESPONSE: Evaluation on  05/30/2023: Functional activity: gait Pre-activity vitals: HR: 65 SpO2: 97% Post-activity vitals: HR: 86 SpO2: 96% Modified Borg scale for dyspnea: 2: mild shortness of breath  POSTURE: Evaluation on 05/30/2023: weight shift right  LOWER EXTREMITY ROM:  ROM P:passive  A:active Right eval Left eval  Hip flexion    Hip extension  Standing -10*  Hip abduction    Hip adduction    Hip internal rotation    Hip external rotation    Knee flexion    Knee extension    Ankle dorsiflexion    Ankle plantarflexion    Ankle inversion    Ankle eversion     (Blank rows = not tested)  LOWER EXTREMITY MMT:  MMT Right eval Left eval  Hip flexion    Hip extension    Hip abduction    Hip adduction    Hip internal rotation    Hip external rotation    Knee flexion    Knee extension    Ankle dorsiflexion    Ankle plantarflexion    Ankle inversion    Ankle eversion    At Evaluation all strength testing is grossly seated and functionally standing / gait. (Blank rows = not tested)  TRANSFERS: 06/25/2023:  Patient able to sit to/from stand engaging hydraulics of prosthetic knee from 18" chair without armrests using BUEs  Evaluation on 05/30/2023: Sit to stand: SBA (cues only) using BUEs on seat of 18" chair and stabilizes without UE support. Minor engagement of MPK prosthesis.  Stand to sit: SBA (cues only) using BUEs on seat of 18" chair and stabilizes without UE support. Minor engagement of MPK prosthesis.   FUNCTIONAL TESTs:  06/25/2023:  Patient able to pick up object from floor without UE support using prosthetic hydraulics with supervision.  Evaluation on 05/30/2023:  Berg Balance  Scale: 32/56  GAIT: 06/25/2023:  Patient ambulates 100' with cane & prosthesis with minA.  Evaluation on 05/30/2023: Gait pattern: step through pattern, decreased stance time- Left, decreased hip/knee flexion- Left, antalgic, and trunk flexed Distance walked: 150' Assistive device utilized: with TFA MPK prosthesis Single point cane (minA), Walker - 2 wheeled (modified independent / cues for deviations) Level of assistance: Modified independence, SBA, and Min A Gait velocity: with RW comfortable / self-selected 1.03 ft/sec & fast pace 1.96 ft/sec;  with cane & minA comfortable / self-selected 0.67 ft/sec & fast pace 0.69 ft/sec;  CURRENT PROSTHETIC WEAR ASSESSMENT: 06/25/2023: patient able to don suction ring socket with proper rotation.    Evaluation on 05/30/2023: Patient is independent with: skin check, residual limb care, care of non-amputated limb, prosthetic cleaning, ply sock cleaning, and correct ply sock adjustment Patient is dependent with: proper wear schedule/adjustment Donning prosthesis: SBA / verbal cues Doffing prosthesis: Modified independence Prosthetic wear tolerance: 10-12 hours/day, 7 days/week Prosthetic weight bearing tolerance: 10 minutes Residual limb condition: pt denies any issues.  Prosthetic description: silicon liner with suction ring suspension, Microprocessor Knee (MPK), dynamic response foot, Total Contact Socket with flexible inner socket K code/activity level with prosthetic use: Level 3    TODAY'S  TREATMENT:                                                                                                                             DATE:  08/01/2023: Prosthetic Care with Transfemoral Microprocessor Knee (MPK) prosthesis: PT worked on patient's ability to increase speed which will also help program M PK to operate at various speeds.  Patient ambulated with cane and HHA / minA 120' self-selected pace for 5 steps on prosthesis then fast pace for 5 steps on  prosthesis.  Progressed to performing verbal gait speeds with rolling walker so that patient is able to perform it outside  of PT.  Patient able to demonstrate and return demonstration understanding PT worked on patient's ability to negotiate around obstacles in a weaving pattern.  PT demo, tactile and verbal cues on use of pelvic rotation to orient feet and allow for fluency of motion.  Patient performed activity with cane & HHA/minA and with his RW.  Patient able to verbalize and demonstrate understanding to perform activity to work on skill at home. Patient ambulated 20 feet including sit/stand holding an open bottle of water using cane stand-alone tip with PT close supervision.  Patient verbalized understanding to need to work on the skill at home.  PT and patient also discussed the difference between carrying an item that requires coordination like the open water versus carrying weighted items.  Patient needs to continue to work on both skills.   TREATMENT:                                                                                                                             DATE:  07/23/2023: Prosthetic Care with Transfemoral Microprocessor Knee (MPK) prosthesis: PT reviewed benefits to using rollator vs std RW including easier movement with grass or gravel surfaces, able to transport items and resting surface if walks further than he is able.  Pt & wife verbalized understanding.   PT instructed in changing shoes on prosthesis with same heel height as changing ht can effect prosthetic knee.   Pt amb 150' X 2 with cane stand alone tip working on scanning right/left/up maintaining path.   PT demo & verbal cues on picking up pace. Pt performed with RW for 3 steps on prosthesis.      TREATMENT:                                                                                                                             DATE:  07/15/2023: Prosthetic Care with Transfemoral Microprocessor Knee (MPK)  prosthesis: PT continues with verbal & tactile cues for weight shift over prosthesis in stance.  Patient ambulated 25' X 2 with cane stand-alone tip and HHA min assist. Pt amb 120' with cane stand-alone tip and HHA min assist with distraction so not 100% focused on his gait.  Pt lifted 5# kettle bell from / to floor with cane support, carried it 30' around 3 obstacles with minA.  1st rep turning left and 2nd rep turning right around obstacles.  PT demo &  verbal cues on picking up pace with quicker & longer steps which also helps to develop memory in the MPK unit.  Pt amb with RW picking up pace for 30' x 2 with CGA.      PATIENT EDUCATION: PATIENT EDUCATED ON FOLLOWING PROSTHETIC CARE: Education details:   Proper doffing with proper rotation Person educated: Patient and Spouse Education method: Explanation, Demonstration, Tactile cues, and Verbal cues Education comprehension: verbalized understanding and needs further education  HOME EXERCISE PROGRAM: Try to do these exercises 3-5 times per day.   In front of chair with walker in front for safety: Stand up & sit down 5-10 reps without touching walker unless needed for balance. 5-10 reps Ball up piece of paper.  Lower yourself like sitting down to put paper on floor or pick it up.  Raise back upright similar to standing up.  Pick up 5# object using above technique. 5-10 reps  Near counter top or rail on deck / porch.  Walk forward with left hand near counter looking forward, not staring at floor. 5-10 reps Walk backwards with cane and left hand near counter. 5-10 reps Walk sideways with cane and left hand near counter.  Push off your outside leg.  5-10 reps Use above side stepping to work on turning 90*  push off outside leg and turn other leg to face forward to walk down counter.  When turning to your left you will need a chair back across from counter to touch.    Place 4" step in front of sink or rail and chair back across from  it Step up on 4" step with right leg and place prosthetic toes over the edge of step. As soon as prosthetic knee starts to bend step down with Right leg.  Turn around towards counter.  5-10 times   07/02/2023: At counter:  Place a chair at end of counter so left hand will be on counter when you stand up. Work on standing up and walking with less hesitancy upon arising. Turn around towards counter at each end.  On return trip your table should now be on your left side to touch as needed.  Work on this 5-10 times.  Side step with cane & left hand on counter.  Going to right, push off left side when stepping.  Going to left, push off right side.  Progress to turning to walk down counter.  Going to right, push off left side and turn right leg, then move cane, bring prosthesis around using "headlight"   Going to left, push off right side turning prosthesis (hit heel), reach left hand to table and walk forward.   ASSESSMENT:  CLINICAL IMPRESSION: PT progressed functional activities to be performed at home of varying speed, negotiating obstacles and carrying couple of water.  Patient appears to understand updated functional activities.  Patient continues to benefit from skilled PT to maximize function and safety with his MPK prosthesis.    OBJECTIVE IMPAIRMENTS: Abnormal gait, decreased activity tolerance, decreased balance, decreased endurance, decreased knowledge of condition, decreased knowledge of use of DME, decreased mobility, difficulty walking, decreased ROM, decreased strength, and prosthetic dependency .   ACTIVITY LIMITATIONS: carrying, lifting, standing, stairs, transfers, and locomotion level  PARTICIPATION LIMITATIONS: meal prep, community activity, church, and household mobility  PERSONAL FACTORS: Age, Fitness, Past/current experiences, Time since onset of injury/illness/exacerbation, and 3+ comorbidities: see PMH  are also affecting patient's functional outcome.   REHAB POTENTIAL:  Good  CLINICAL DECISION MAKING: Evolving/moderate complexity  EVALUATION COMPLEXITY: Moderate   GOALS: Goals reviewed with patient? Yes  SHORT TERM GOALS: Target date: 06/27/2023  Patient reports able to don prosthesis with proper rotation modified independent. Baseline: SEE OBJECTIVE DATA Goal status: MET 06/25/2023 2.  Patient able to sit to/from stand engaging hydraulics of prosthetic knee from 18" chair without armrests using BUEs.  Baseline: SEE OBJECTIVE DATA Goal status: MET 06/25/2023  3.  Patient able to pick up object from floor without UE support using prosthetic hydraulics with supervision. Baseline: SEE OBJECTIVE DATA Goal status: MET  06/25/2023  4. Patient ambulates 100' with cane & prosthesis with minA. Baseline: SEE OBJECTIVE DATA Goal status: MET  06/25/2023   LONG TERM GOALS: Target date: 08/28/2023  Patient demonstrates & verbalized understanding of prosthetic care to enable safe utilization of prosthesis. Baseline: SEE OBJECTIVE DATA Goal status: Ongoing   08/01/2023  Patient-Specific Activity Scoring >/= 4/10 Baseline: SEE OBJECTIVE DATA Goal status: Ongoing  08/01/2023  Berg Balance >/= 40/56 (increase by >8 points) to indicate lower fall risk Baseline: SEE OBJECTIVE DATA Goal status:  Ongoing  08/01/2023  Patient ambulates 100' with prosthesis and cane independently Baseline: SEE OBJECTIVE DATA Goal status: Ongoing  08/01/2023  Patient negotiates ramps & curbs with cane & prosthesis with supervision. Baseline: SEE OBJECTIVE DATA Goal status: Ongoing  08/01/2023  Patient negotiates stairs with single rail & prosthesis with supervision.  Baseline: SEE OBJECTIVE DATA Goal status: Ongoing  08/01/2023   PLAN:  PT FREQUENCY: 1x/week  PT DURATION: 90 days  PLANNED INTERVENTIONS: 97164- PT Re-evaluation, 97110-Therapeutic exercises, 97530- Therapeutic activity, 97112- Neuromuscular re-education, [phone]- Self Care, 21308- Gait training, [phone]- Prosthetic  training, Patient/Family education, Balance training, and Stair training  PLAN FOR NEXT SESSION:   Due 10th visit and progress note,    continue work towards LTGs, gait with cane,  continue to work on prosthetic gait with normal knee movement patterns.   Lorie Rook, PT, DPT 08/01/2023, 4:08 PM     Date of referral: 05/17/2023 Referring provider: Lydia Sams, MD Referring diagnosis? Acquired absence of left leg above knee V49.76 & Z89.612 Treatment diagnosis? (if different than referring diagnosis)  Other abnormalities of gait and mobility ICD-10-CM:  R26.89  Unsteadiness on feet ICD-10-CM:  R26.81  Muscle weakness (generalized) ICD-10-CM:  M62.81  Stiffness of left hip, not elsewhere classified M25.652  Abnormal posture R29.3  What was this (referring dx) caused by? Surgery (Type: AKA)  Nature of Condition: Initial Onset (within last 3 months)  reason for referral delivery of new prosthesis with significant change in design & function within last 3 months   Laterality: Lt  Current Functional Measure Score: Gait Velocity & assist with cane & prosthesis - minA comfortable / self-selected 0.67 ft/sec & fast pace 0.69 ft/sec;  Objective measurements identify impairments when they are compared to normal values, the uninvolved extremity, and prior level of function.  [x]  Yes  []  No  Objective assessment of functional ability: Moderate functional limitations   Briefly describe symptoms: patient is dependent in gait & balance with new Microprocessor Knee prosthesis  How did symptoms start: amputation due to vascular issues  Average pain intensity:  Last 24 hours: 0  Past week: 0  How often does the pt experience symptoms? Frequently  How much have the symptoms interfered with usual daily activities? Moderately  How has condition changed since care began at this facility? NA - initial visit  In general, how is the patients overall health? Good   BACK PAIN (STarT  Back Screening Tool) No

## 2023-08-06 ENCOUNTER — Encounter: Payer: Self-pay | Admitting: Physical Therapy

## 2023-08-06 ENCOUNTER — Ambulatory Visit (INDEPENDENT_AMBULATORY_CARE_PROVIDER_SITE_OTHER): Payer: 59 | Admitting: Physical Therapy

## 2023-08-06 DIAGNOSIS — R293 Abnormal posture: Secondary | ICD-10-CM | POA: Diagnosis not present

## 2023-08-06 DIAGNOSIS — R2681 Unsteadiness on feet: Secondary | ICD-10-CM | POA: Diagnosis not present

## 2023-08-06 DIAGNOSIS — M25652 Stiffness of left hip, not elsewhere classified: Secondary | ICD-10-CM

## 2023-08-06 DIAGNOSIS — M6281 Muscle weakness (generalized): Secondary | ICD-10-CM

## 2023-08-06 DIAGNOSIS — R2689 Other abnormalities of gait and mobility: Secondary | ICD-10-CM

## 2023-08-06 NOTE — Therapy (Signed)
 OUTPATIENT PHYSICAL THERAPY PROSTHETIC TREATMENT & PROGRESS NOTE   Patient Name: Samuel Carney MRN: [id number] DOB:1945/11/08, 78 y.o., male Today's Date: 08/06/2023  Progress Note Reporting Period 05/30/2023 to 08/06/2023  See note below for Objective Data and Assessment of Progress/Goals.   END OF SESSION:  PT End of Session - 08/06/23 0930     Visit Number 10    Number of Visits 14    Date for PT Re-Evaluation 08/28/23    Authorization Type [employer] Medicare dual complete    Authorization Time Period NO AUTH NEEDED, NO COPAY, 10% COINSURANCE    Progress Note Due on Visit 20    PT Start Time 0930    PT Stop Time 1014    PT Time Calculation (min) 44 min    Equipment Utilized During Treatment Gait belt    Activity Tolerance Patient tolerated treatment well    Behavior During Therapy WFL for tasks assessed/performed                      Past Medical History:  Diagnosis Date   Alcohol abuse    Aortic atherosclerosis (HCC)    B12 deficiency    Cirrhosis (HCC)    COPD (chronic obstructive pulmonary disease) (HCC)    Coronary artery disease    GERD (gastroesophageal reflux disease)    Glaucoma    Grade II diastolic dysfunction    History of kidney stones    HLD (hyperlipidemia)    Hx of radiation therapy    Hypertension    Lung mass    Moderate mitral regurgitation    Pneumonia    PVD (peripheral vascular disease) (HCC)    Squamous cell carcinoma of lung, right (HCC) 2019   Past Surgical History:  Procedure Laterality Date   COLONOSCOPY WITH PROPOFOL      COLONOSCOPY WITH PROPOFOL  N/A 11/25/2018   Procedure: COLONOSCOPY WITH PROPOFOL ;  Surgeon: Deveron Fly, MD;  Location: [facility] ENDOSCOPY;  Service: Endoscopy;  Laterality: N/A;   COLONOSCOPY WITH PROPOFOL  N/A 11/29/2021   Procedure: COLONOSCOPY WITH PROPOFOL ;  Surgeon: Toledo, Alphonsus Jeans, MD;  Location: [facility] ENDOSCOPY;  Service: Gastroenterology;  Laterality: N/A;   ENDARTERECTOMY FEMORAL Left 07/13/2020    Procedure: ENDARTERECTOMY FEMORAL ( SFA STENT);  Surgeon: Jackquelyn Mass, MD;  Location: [facility] ORS;  Service: Vascular;  Laterality: Left;   ESOPHAGOGASTRODUODENOSCOPY (EGD) WITH PROPOFOL  N/A 11/25/2018   Procedure: ESOPHAGOGASTRODUODENOSCOPY (EGD) WITH PROPOFOL ;  Surgeon: Deveron Fly, MD;  Location[facility] ENDOSCOPY;  Service: Endoscopy;  Laterality: N/A;   ESOPHAGOGASTRODUODENOSCOPY (EGD) WITH PROPOFOL  N/A 11/29/2021   Procedure: ESOPHAGOGASTRODUODENOSCOPY (EGD) WITH PROPOFOL ;  Surgeon: Toledo, Alphonsus Jeans, MD;  Location: [facility] ENDOSCOPY;  Service: Gastroenterology;  Laterality: N/A;   ESOPHAGOGASTRODUODENOSCOPY (EGD) WITH PROPOFOL  N/A 12/08/2021   Procedure: ESOPHAGOGASTRODUODENOSCOPY (EGD) WITH PROPOFOL ;  Surgeon: Luke Salaam, MD;  Location: [facility] ENDOSCOPY;  Service: Gastroenterology;  Laterality: N/A;   LEG AMPUTATION ABOVE KNEE Left    LOWER EXTREMITY ANGIOGRAPHY Left 06/07/2020   Procedure: LOWER EXTREMITY ANGIOGRAPHY;  Surgeon: Jackquelyn Mass, MD;  Location: [facility] INVASIVE CV LAB;  Service: Cardiovascular;  Laterality: Left;   LOWER EXTREMITY ANGIOGRAPHY Left 01/10/2021   Procedure: LOWER EXTREMITY ANGIOGRAPHY;  Surgeon: Jackquelyn Mass, MD;  Location: [facility] INVASIVE CV LAB;  Service: Cardiovascular;  Laterality: Left;   Patient Active Problem List   Diagnosis Date Noted   UGIB (upper gastrointestinal bleed)    Malnutrition of moderate degree 12/06/2021   Melena 12/05/2021   COPD (chronic obstructive pulmonary  disease) (HCC)    Grade II diastolic dysfunction    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), left (HCC)    AKI (acute kidney injury) (HCC)    Atherosclerosis of artery of extremity with ulceration (HCC) 07/13/2020   Atherosclerosis of native arteries of the extremities with ulceration (HCC) 05/19/2020   Alcoholic cirrhosis of 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) 04/09/2019   Hyperlipidemia, mixed 04/09/2019   Malignant 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) 04/09/2019    Thrombocytopenia (HCC) 04/09/2019   CAD (coronary artery disease) 10/14/2017   Lung cancer (HCC) 10/14/2017   Tobacco abuse 08/14/2017   Benign essential hypertension 09/09/2013    PCP: Rory Collard, MD  REFERRING PROVIDER: Lydia Sams, MD  ONSET DATE: 05/14/2023 received new Microprocessor Knee  REFERRING DIAG: Acquired absence of left leg above knee V49.76 & Z89.612  THERAPY DIAG:  Other abnormalities of gait and mobility  Unsteadiness on feet  Muscle weakness (generalized)  Abnormal posture  Stiffness of left hip, not elsewhere classified  Rationale for Evaluation and Treatment: Rehabilitation  SUBJECTIVE:   SUBJECTIVE STATEMENT: He has been walking more going out in community.  He is wearing prosthesis all awake hours without any issues.  Pt accompanied by: significant other  PERTINENT HISTORY: PAD, CAD, angina, malignant neoplasm of right lung, HLD, alcoholic cirrhosis of liver, HTN, COPD   PAIN:  Are you having pain? No  PRECAUTIONS: None  WEIGHT BEARING RESTRICTIONS: No  FALLS: Has patient fallen in last 6 months? No  LIVING ENVIRONMENT: Lives with: lives with their spouse Lives in: House Home Access: Stairs to enter and Ramped entrance Home layout: One level and single step into washer / dryer Stairs: Yes: Internal: 1 steps; none and External: 2 steps; on left going up Has following equipment at home: Single point cane, Walker - 2 wheeled, Crutches, Wheelchair (manual), shower chair, and Ramped entry  OCCUPATION: retired  PLOF: walks independently with prosthesis and RW in community. He uses cane in home.   PATIENT GOALS:  to learn to use new prosthesis to walk in home and community with cane.   OBJECTIVE:  Patient-Specific Activity Scoring Scheme  "0" represents "unable to perform." "10" represents "able to perform at prior level. 0 1 2 3 4 5 6 7 8 9  10 (Date and Score)   Activity Eval  08/06/23   1. Walking in house with cane  1   6  2. standing balance with ADLs  2  6  3. stairs 2 6  4.    5.    Score 1.67 6   Total score = sum of the activity scores/number of activities Minimum detectable change (90%CI) for average score = 2 points Minimum detectable change (90%CI) for single activity score = 3 points  COGNITION: Evaluation on 05/30/2023: Overall cognitive status: Within functional limits for tasks assessed  CARDIOVASCULAR RESPONSE: Evaluation on  05/30/2023: Functional activity: gait Pre-activity vitals: HR: 65 SpO2: 97% Post-activity vitals: HR: 86 SpO2: 96% Modified Borg scale for dyspnea: 2: mild shortness of breath  POSTURE: Evaluation on 05/30/2023: weight shift right  LOWER EXTREMITY ROM:  ROM P:passive  A:active Right eval Left eval  Hip flexion    Hip extension  Standing -10*  Hip abduction    Hip adduction    Hip internal rotation    Hip external rotation    Knee flexion    Knee extension    Ankle dorsiflexion    Ankle plantarflexion    Ankle inversion  Ankle eversion     (Blank rows = not tested)  LOWER EXTREMITY MMT:  MMT Right eval Left eval  Hip flexion    Hip extension    Hip abduction    Hip adduction    Hip internal rotation    Hip external rotation    Knee flexion    Knee extension    Ankle dorsiflexion    Ankle plantarflexion    Ankle inversion    Ankle eversion    At Evaluation all strength testing is grossly seated and functionally standing / gait. (Blank rows = not tested)  TRANSFERS: 06/25/2023:  Patient able to sit to/from stand engaging hydraulics of prosthetic knee from 18" chair without armrests using BUEs  Evaluation on 05/30/2023: Sit to stand: SBA (cues only) using BUEs on seat of 18" chair and stabilizes without UE support. Minor engagement of MPK prosthesis.  Stand to sit: SBA (cues only) using BUEs on seat of 18" chair and stabilizes without UE support. Minor engagement of MPK prosthesis.   FUNCTIONAL TESTs:  08/06/2023: Timed Up & Go:  with RW 27.71 sec & with cane 36.81 sec Berg Balance Test:  34/56   06/25/2023:  Patient able to pick up object from floor without UE support using prosthetic hydraulics with supervision.  Evaluation on 05/30/2023:  Berg Balance Scale: 32/56  GAIT: 06/25/2023:  Patient ambulates 100' with cane & prosthesis with minA.  Evaluation on 05/30/2023: Gait pattern: step through pattern, decreased stance time- Left, decreased hip/knee flexion- Left, antalgic, and trunk flexed Distance walked: 150' Assistive device utilized: with TFA MPK prosthesis Single point cane (minA), Walker - 2 wheeled (modified independent / cues for deviations) Level of assistance: Modified independence, SBA, and Min A Gait velocity: with RW comfortable / self-selected 1.03 ft/sec & fast pace 1.96 ft/sec;  with cane & minA comfortable / self-selected 0.67 ft/sec & fast pace 0.69 ft/sec;  CURRENT PROSTHETIC WEAR ASSESSMENT: 08/06/2023: Patient is independent with: skin check, residual limb care, care of non-amputated limb, prosthetic cleaning, ply sock cleaning, and correct ply sock adjustment Patient is dependent with: proper wear schedule/adjustment Donning prosthesis: Modified independence Doffing prosthesis: Modified independence Prosthetic wear tolerance: >90% of awake hours/day, 7 days/week  06/25/2023: patient able to don suction ring socket with proper rotation.   Evaluation on 05/30/2023: Patient is independent with: skin check, residual limb care, care of non-amputated limb, prosthetic cleaning, ply sock cleaning, and correct ply sock adjustment Patient is dependent with: proper wear schedule/adjustment Donning prosthesis: SBA / verbal cues Doffing prosthesis: Modified independence Prosthetic wear tolerance: 10-12 hours/day, 7 days/week Prosthetic weight bearing tolerance: 10 minutes Residual limb condition: pt denies any issues.  Prosthetic description: silicon liner with suction ring suspension, Microprocessor  Knee (MPK), dynamic response foot, Total Contact Socket with flexible inner socket K code/activity level with prosthetic use: Level 3    TODAY'S  TREATMENT:  DATE:  08/05/2023: Prosthetic Care with Transfemoral Microprocessor Knee (MPK) prosthesis: See objective data for TUG, Berg & Prosthetic care / wear. Pt amb 130' with cane stand alone tip with CGA. Cues on upright posture and weight shift over prosthesis in stance.  Worked on pre-gait activity working on weight shift onto prosthesis in stance and RLE terminal stance to initial contact with visual cues for step through pattern with RLE heel passing prosthetic toes.  Patient and wife verbalized understanding how to perform this as HEP at sink at home. Worked on patient initiating gait upon arising with less hesitancy.  Patient position with counter to his left upon arising in case he needs to touch the counter for support, turning 180 degrees both CW and CCW.  Patient able to return demonstration and verbalized understanding his HEP including rationale.   TREATMENT:                                                                                                                             DATE:  08/01/2023: Prosthetic Care with Transfemoral Microprocessor Knee (MPK) prosthesis: PT worked on patient's ability to increase speed which will also help program M PK to operate at various speeds.  Patient ambulated with cane and HHA / minA 120' self-selected pace for 5 steps on prosthesis then fast pace for 5 steps on prosthesis.  Progressed to performing verbal gait speeds with rolling walker so that patient is able to perform it outside of PT.  Patient able to demonstrate and return demonstration understanding PT worked on patient's ability to negotiate around obstacles in a weaving pattern.  PT demo, tactile and verbal cues on use of  pelvic rotation to orient feet and allow for fluency of motion.  Patient performed activity with cane & HHA/minA and with his RW.  Patient able to verbalize and demonstrate understanding to perform activity to work on skill at home. Patient ambulated 20 feet including sit/stand holding an open bottle of water using cane stand-alone tip with PT close supervision.  Patient verbalized understanding to need to work on the skill at home.  PT and patient also discussed the difference between carrying an item that requires coordination like the open water versus carrying weighted items.  Patient needs to continue to work on both skills.   TREATMENT:  DATE:  07/23/2023: Prosthetic Care with Transfemoral Microprocessor Knee (MPK) prosthesis: PT reviewed benefits to using rollator vs std RW including easier movement with grass or gravel surfaces, able to transport items and resting surface if walks further than he is able.  Pt & wife verbalized understanding.   PT instructed in changing shoes on prosthesis with same heel height as changing ht can effect prosthetic knee.   Pt amb 150' X 2 with cane stand alone tip working on scanning right/left/up maintaining path.   PT demo & verbal cues on picking up pace. Pt performed with RW for 3 steps on prosthesis.      PATIENT EDUCATION: PATIENT EDUCATED ON FOLLOWING PROSTHETIC CARE: Education details:   Proper doffing with proper rotation Person educated: Patient and Spouse Education method: Explanation, Demonstration, Tactile cues, and Verbal cues Education comprehension: verbalized understanding and needs further education  HOME EXERCISE PROGRAM: Try to do these exercises 3-5 times per day.   In front of chair with walker in front for safety: Stand up & sit down 5-10 reps without touching walker unless needed for balance. 5-10 reps Ball  up piece of paper.  Lower yourself like sitting down to put paper on floor or pick it up.  Raise back upright similar to standing up.  Pick up 5# object using above technique. 5-10 reps  Near counter top or rail on deck / porch.  Walk forward with left hand near counter looking forward, not staring at floor. 5-10 reps Walk backwards with cane and left hand near counter. 5-10 reps Walk sideways with cane and left hand near counter.  Push off your outside leg.  5-10 reps Use above side stepping to work on turning 90*  push off outside leg and turn other leg to face forward to walk down counter.  When turning to your left you will need a chair back across from counter to touch.    Place 4" step in front of sink or rail and chair back across from it Step up on 4" step with right leg and place prosthetic toes over the edge of step. As soon as prosthetic knee starts to bend step down with Right leg.  Turn around towards counter.  5-10 times   07/02/2023: At counter:  Place a chair at end of counter so left hand will be on counter when you stand up. Work on standing up and walking with less hesitancy upon arising. Turn around towards counter at each end.  On return trip your table should now be on your left side to touch as needed.  Work on this 5-10 times.  Side step with cane & left hand on counter.  Going to right, push off left side when stepping.  Going to left, push off right side.  Progress to turning to walk down counter.  Going to right, push off left side and turn right leg, then move cane, bring prosthesis around using "headlight"   Going to left, push off right side turning prosthesis (hit heel), reach left hand to table and walk forward.   ASSESSMENT:  CLINICAL IMPRESSION: Patient is made significant progress with his mobility with the microprocessor prosthesis.  He reports using cane in the home more.  Patient reports higher functioning at home as noted by patient activity scoring noted in  objective data.  Patient continues to benefit from skilled PT to maximize function and safety with his MPK prosthesis.    OBJECTIVE IMPAIRMENTS: Abnormal gait, decreased activity tolerance, decreased balance, decreased  endurance, decreased knowledge of condition, decreased knowledge of use of DME, decreased mobility, difficulty walking, decreased ROM, decreased strength, and prosthetic dependency .   ACTIVITY LIMITATIONS: carrying, lifting, standing, stairs, transfers, and locomotion level  PARTICIPATION LIMITATIONS: meal prep, community activity, church, and household mobility  PERSONAL FACTORS: Age, Fitness, Past/current experiences, Time since onset of injury/illness/exacerbation, and 3+ comorbidities: see PMH  are also affecting patient's functional outcome.   REHAB POTENTIAL: Good  CLINICAL DECISION MAKING: Evolving/moderate complexity  EVALUATION COMPLEXITY: Moderate   GOALS: Goals reviewed with patient? Yes  SHORT TERM GOALS: Target date: 06/27/2023  Patient reports able to don prosthesis with proper rotation modified independent. Baseline: SEE OBJECTIVE DATA Goal status: MET 06/25/2023 2.  Patient able to sit to/from stand engaging hydraulics of prosthetic knee from 18" chair without armrests using BUEs.  Baseline: SEE OBJECTIVE DATA Goal status: MET 06/25/2023  3.  Patient able to pick up object from floor without UE support using prosthetic hydraulics with supervision. Baseline: SEE OBJECTIVE DATA Goal status: MET  06/25/2023  4. Patient ambulates 100' with cane & prosthesis with minA. Baseline: SEE OBJECTIVE DATA Goal status: MET  06/25/2023   LONG TERM GOALS: Target date: 08/28/2023  Patient demonstrates & verbalized understanding of prosthetic care to enable safe utilization of prosthesis. Baseline: SEE OBJECTIVE DATA Goal status: Ongoing   08/05/2023  Patient-Specific Activity Scoring >/= 4/10 Baseline: SEE OBJECTIVE DATA Goal status: Ongoing  08/05/2023  Berg  Balance >/= 40/56 (increase by >8 points) to indicate lower fall risk Baseline: SEE OBJECTIVE DATA Goal status:  Ongoing  08/05/2023  Patient ambulates 100' with prosthesis and cane independently Baseline: SEE OBJECTIVE DATA Goal status: Ongoing  08/05/2023  Patient negotiates ramps & curbs with LRAD & prosthesis modified independent.  Baseline: SEE OBJECTIVE DATA Goal status: Ongoing  08/05/2023  Patient negotiates stairs with single rail & prosthesis modified independent.  Baseline: SEE OBJECTIVE DATA Goal status: Ongoing  08/05/2023   PLAN:  PT FREQUENCY: 1x/week  PT DURATION: 90 days  PLANNED INTERVENTIONS: 97164- PT Re-evaluation, 97110-Therapeutic exercises, 97530- Therapeutic activity, 97112- Neuromuscular re-education, [phone]- Self Care, 19147- Gait training, [phone]- Prosthetic training, Patient/Family education, Balance training, and Stair training  PLAN FOR NEXT SESSION:    continue work towards LTGs, gait with cane,  continue to work on prosthetic gait with normal knee movement patterns.   Lorie Rook, PT, DPT 08/06/2023, 3:27 PM     Date of referral: 05/17/2023 Referring provider: Lydia Sams, MD Referring diagnosis? Acquired absence of left leg above knee V49.76 & Z89.612 Treatment diagnosis? (if different than referring diagnosis)  Other abnormalities of gait and mobility ICD-10-CM:  R26.89  Unsteadiness on feet ICD-10-CM:  R26.81  Muscle weakness (generalized) ICD-10-CM:  M62.81  Stiffness of left hip, not elsewhere classified M25.652  Abnormal posture R29.3  What was this (referring dx) caused by? Surgery (Type: AKA)  Nature of Condition: Initial Onset (within last 3 months)  reason for referral delivery of new prosthesis with significant change in design & function within last 3 months   Laterality: Lt  Current Functional Measure Score: Gait Velocity & assist with cane & prosthesis - minA comfortable / self-selected 0.67 ft/sec & fast pace 0.69  ft/sec;  Objective measurements identify impairments when they are compared to normal values, the uninvolved extremity, and prior level of function.  [x]  Yes  []  No  Objective assessment of functional ability: Moderate functional limitations   Briefly describe symptoms: patient is dependent in gait & balance with new Microprocessor Knee  prosthesis  How did symptoms start: amputation due to vascular issues  Average pain intensity:  Last 24 hours: 0  Past week: 0  How often does the pt experience symptoms? Frequently  How much have the symptoms interfered with usual daily activities? Moderately  How has condition changed since care began at this facility? NA - initial visit  In general, how is the patients overall health? Good   BACK PAIN (STarT Back Screening Tool) No

## 2023-08-08 ENCOUNTER — Ambulatory Visit
Admission: RE | Admit: 2023-08-08 | Discharge: 2023-08-08 | Disposition: A | Discharge status: Home / Self Care | Source: Ambulatory Visit | Attending: Gastroenterology | Admitting: Gastroenterology

## 2023-08-08 DIAGNOSIS — K7030 Alcoholic cirrhosis of liver without ascites: Secondary | ICD-10-CM | POA: Insufficient documentation

## 2023-08-13 ENCOUNTER — Encounter: Payer: Self-pay | Admitting: Physical Therapy

## 2023-08-13 ENCOUNTER — Ambulatory Visit (INDEPENDENT_AMBULATORY_CARE_PROVIDER_SITE_OTHER): Admitting: Physical Therapy

## 2023-08-13 DIAGNOSIS — R2681 Unsteadiness on feet: Secondary | ICD-10-CM | POA: Diagnosis not present

## 2023-08-13 DIAGNOSIS — M25652 Stiffness of left hip, not elsewhere classified: Secondary | ICD-10-CM

## 2023-08-13 DIAGNOSIS — M6281 Muscle weakness (generalized): Secondary | ICD-10-CM | POA: Diagnosis not present

## 2023-08-13 DIAGNOSIS — R2689 Other abnormalities of gait and mobility: Secondary | ICD-10-CM

## 2023-08-13 DIAGNOSIS — R293 Abnormal posture: Secondary | ICD-10-CM

## 2023-08-13 NOTE — Patient Instructions (Signed)
 At counter:  Place a chair at end of counter so left hand will be on counter when you stand up. Work on standing up and walking with less hesitancy upon arising. Turn around towards counter at each end.  On return trip your table should now be on your left side to touch as needed.  Work on this 5-10 times.  Side step with cane & left hand on counter.  Going to right, push off left side when stepping.  Going to left, push off right side.  Move chair so back is across from sink:  using cane turn from facing sink going to your left until you are facing the chair back, turn around to face sink going to your right Place 2 bands on floor between chair & sink with enough room between the 2 bands for left foot can fit.   (You can put 2 pieces of tape on floor instead of bands if you want). Turn so your left side is to sink & Hold cane in right hand place prosthetic foot between the 2 lines: step RIGHT foot so toe steps on band or tape behind the prosthetic foot then step so heel touches band or tape in front of left foot.   Turn the chair around to face the sink:  face the chair - bend forward gently placing hands in chair bottom then come back upright.  Do 5-10 times.   Stand facing the sink & open up the lower cabinet doors:  hold cane in right hand and left hand on sink (as you improve try to hoover left hand over sink). Try to touch right toes to lower shelf of cabinet with control 5-10 reps.

## 2023-08-13 NOTE — Therapy (Signed)
 OUTPATIENT PHYSICAL THERAPY PROSTHETIC TREATMENT   Patient Name: Samuel Carney MRN: [id number] DOB:[birth year], 78 y.o., male Today's Date: 08/13/2023  END OF SESSION:  PT End of Session - 08/13/23 0934     Visit Number 11    Number of Visits 14    Date for PT Re-Evaluation 08/28/23    Authorization Type [employer] Medicare dual complete    Authorization Time Period NO AUTH NEEDED, NO COPAY, 10% COINSURANCE    Progress Note Due on Visit 20    PT Start Time 0933    PT Stop Time 1015    PT Time Calculation (min) 42 min    Equipment Utilized During Treatment Gait belt    Activity Tolerance Patient tolerated treatment well    Behavior During Therapy WFL for tasks assessed/performed                       Past Medical History:  Diagnosis Date   Alcohol abuse    Aortic atherosclerosis (HCC)    B12 deficiency    Cirrhosis (HCC)    COPD (chronic obstructive pulmonary disease) (HCC)    Coronary artery disease    GERD (gastroesophageal reflux disease)    Glaucoma    Grade II diastolic dysfunction    History of kidney stones    HLD (hyperlipidemia)    Hx of radiation therapy    Hypertension    Lung mass    Moderate mitral regurgitation    Pneumonia    PVD (peripheral vascular disease) (HCC)    Squamous cell carcinoma of lung, right (HCC) 2019   Past Surgical History:  Procedure Laterality Date   COLONOSCOPY WITH PROPOFOL      COLONOSCOPY WITH PROPOFOL  N/A 11/25/2018   Procedure: COLONOSCOPY WITH PROPOFOL ;  Surgeon: Deveron Fly, MD;  Location: [facility];  Service: Endoscopy;  Laterality: N/A;   COLONOSCOPY WITH PROPOFOL  N/A 11/29/2021   Procedure: COLONOSCOPY WITH PROPOFOL ;  Surgeon: Toledo, Alphonsus Jeans, MD;  Location: [facility];  Service: Gastroenterology;  Laterality: N/A;   ENDARTERECTOMY FEMORAL Left 07/13/2020   Procedure: ENDARTERECTOMY FEMORAL ( SFA STENT);  Surgeon: Jackquelyn Mass, MD;  Location: [facility];  Service: Vascular;  Laterality:  Left;   ESOPHAGOGASTRODUODENOSCOPY (EGD) WITH PROPOFOL  N/A 11/25/2018   Procedure: ESOPHAGOGASTRODUODENOSCOPY (EGD) WITH PROPOFOL ;  Surgeon: Deveron Fly, MD;  Location: [facility] ENDOSCOPY;  Service: Endoscopy;  Laterality: N/A;   ESOPHAGOGASTRODUODENOSCOPY (EGD) WITH PROPOFOL  N/A 11/29/2021   Procedure: ESOPHAGOGASTRODUODENOSCOPY (EGD) WITH PROPOFOL ;  Surgeon: Toledo, Alphonsus Jeans, MD;  Location: [facility] END[facility];  Service: Gastroenterology;  Laterality: N/A;   ESOPHAGOGASTRODUODENOSCOPY (EGD) WITH PROPOFOL  N/A 12/08/2021   Procedure: ESOPHAGOGASTRODUODENOSCOPY (EGD) WITH PROPOFOL ;  Surgeon: Luke Salaam, MD;  Location: [facility] ENDOSCOPY;  Service: Gastroenterology;  Laterality: N/A;   LEG AMPUTATION ABOVE KNEE Left    LOWER EXTREMITY ANGIOGRAPHY Left 06/07/2020   Procedure: LOWER EXTREMITY ANGIOGRAPHY;  Surgeon: Jackquelyn Mass, MD;  Location: [facility];  Service: Cardiovascular;  Laterality: Left;   LOWER EXTREMITY ANGIOGRAPHY Left 01/10/2021   Procedure: LOWER EXTREMITY ANGIOGRAPHY;  Surgeon: Jackquelyn Mass, MD;  Location: [facility];  Service: Cardiovascular;  Laterality: Left;   Patient Active Problem List   Diagnosis Date Noted   UGIB (upper gastrointestinal bleed)    Malnutrition of moderate degree 12/06/2021   Melena 12/05/2021   COPD (chronic obstructive pulmonary disease) (HCC)    Grade II diastolic dysfunction    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), left (HCC)  AKI (acute kidney injury) (HCC)    Atherosclerosis of artery of extremity with ulceration (HCC) 07/13/2020   Atherosclerosis of native arteries of the extremities with ulceration (HCC) 05/19/2020   Alcoholic cirrhosis of 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) 04/09/2019   Hyperlipidemia, mixed 04/09/2019   Malignant 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) 04/09/2019   Thrombocytopenia (HCC) 04/09/2019   CAD (coronary artery disease) 10/14/2017   Lung cancer (HCC) 10/14/2017   Tobacco abuse  08/14/2017   Benign essential hypertension 09/09/2013    PCP: Rory Collard, MD  REFERRING PROVIDER: Lydia Sams, MD  ONSET DATE: 05/14/2023 received new Microprocessor Knee  REFERRING DIAG: Acquired absence of left leg above knee V49.76 & Z89.612  THERAPY DIAG:  Other abnormalities of gait and mobility  Unsteadiness on feet  Muscle weakness (generalized)  Abnormal posture  Stiffness of left hip, not elsewhere classified  Rationale for Evaluation and Treatment: Rehabilitation  SUBJECTIVE:   SUBJECTIVE STATEMENT: He has been trying to work on exercises that PT has given him.  No falls or near falls.   He uses cane in house.   Pt accompanied by: significant other  PERTINENT HISTORY: PAD, CAD, angina, malignant neoplasm of right lung, HLD, alcoholic cirrhosis of liver, HTN, COPD   PAIN:  Are you having pain? No  PRECAUTIONS: None  WEIGHT BEARING RESTRICTIONS: No  FALLS: Has patient fallen in last 6 months? No  LIVING ENVIRONMENT: Lives with: lives with their spouse Lives in: House Home Access: Stairs to enter and Ramped entrance Home layout: One level and single step into washer / dryer Stairs: Yes: Internal: 1 steps; none and External: 2 steps; on left going up Has following equipment at home: Single point cane, Walker - 2 wheeled, Crutches, Wheelchair (manual), shower chair, and Ramped entry  OCCUPATION: retired  PLOF: walks independently with prosthesis and RW in community. He uses cane in home.   PATIENT GOALS:  to learn to use new prosthesis to walk in home and community with cane.   OBJECTIVE:  Patient-Specific Activity Scoring Scheme  "0" represents "unable to perform." "10" represents "able to perform at prior level. 0 1 2 3 4 5 6 7 8 9  10 (Date and Score)   Activity Eval  08/06/23   1. Walking in house with cane  1  6  2. standing balance with ADLs  2  6  3. stairs 2 6  4.    5.    Score 1.67 6   Total score = sum of the activity  scores/number of activities Minimum detectable change (90%CI) for average score = 2 points Minimum detectable change (90%CI) for single activity score = 3 points  COGNITION: Evaluation on 05/30/2023: Overall cognitive status: Within functional limits for tasks assessed  CARDIOVASCULAR RESPONSE: Evaluation on  05/30/2023: Functional activity: gait Pre-activity vitals: HR: 65 SpO2: 97% Post-activity vitals: HR: 86 SpO2: 96% Modified Borg scale for dyspnea: 2: mild shortness of breath  POSTURE: Evaluation on 05/30/2023: weight shift right  LOWER EXTREMITY ROM:  ROM P:passive  A:active Right eval Left eval  Hip flexion    Hip extension  Standing -10*  Hip abduction    Hip adduction    Hip internal rotation    Hip external rotation    Knee flexion    Knee extension    Ankle dorsiflexion    Ankle plantarflexion    Ankle inversion    Ankle eversion     (Blank rows = not tested)  LOWER EXTREMITY MMT:  MMT Right eval Left eval  Hip flexion    Hip extension    Hip abduction    Hip adduction    Hip internal rotation    Hip external rotation    Knee flexion    Knee extension    Ankle dorsiflexion    Ankle plantarflexion    Ankle inversion    Ankle eversion    At Evaluation all strength testing is grossly seated and functionally standing / gait. (Blank rows = not tested)  TRANSFERS: 06/25/2023:  Patient able to sit to/from stand engaging hydraulics of prosthetic knee from 18" chair without armrests using BUEs  Evaluation on 05/30/2023: Sit to stand: SBA (cues only) using BUEs on seat of 18" chair and stabilizes without UE support. Minor engagement of MPK prosthesis.  Stand to sit: SBA (cues only) using BUEs on seat of 18" chair and stabilizes without UE support. Minor engagement of MPK prosthesis.   FUNCTIONAL TESTs:  08/06/2023: Timed Up & Go: with RW 27.71 sec & with cane 36.81 sec Berg Balance Test:  34/56   06/25/2023:  Patient able to pick up object from  floor without UE support using prosthetic hydraulics with supervision.  Evaluation on 05/30/2023:  Berg Balance Scale: 32/56  GAIT: 06/25/2023:  Patient ambulates 100' with cane & prosthesis with minA.  Evaluation on 05/30/2023: Gait pattern: step through pattern, decreased stance time- Left, decreased hip/knee flexion- Left, antalgic, and trunk flexed Distance walked: 150' Assistive device utilized: with TFA MPK prosthesis Single point cane (minA), Walker - 2 wheeled (modified independent / cues for deviations) Level of assistance: Modified independence, SBA, and Min A Gait velocity: with RW comfortable / self-selected 1.03 ft/sec & fast pace 1.96 ft/sec;  with cane & minA comfortable / self-selected 0.67 ft/sec & fast pace 0.69 ft/sec;  CURRENT PROSTHETIC WEAR ASSESSMENT: 08/06/2023: Patient is independent with: skin check, residual limb care, care of non-amputated limb, prosthetic cleaning, ply sock cleaning, and correct ply sock adjustment Patient is dependent with: proper wear schedule/adjustment Donning prosthesis: Modified independence Doffing prosthesis: Modified independence Prosthetic wear tolerance: >90% of awake hours/day, 7 days/week  06/25/2023: patient able to don suction ring socket with proper rotation.   Evaluation on 05/30/2023: Patient is independent with: skin check, residual limb care, care of non-amputated limb, prosthetic cleaning, ply sock cleaning, and correct ply sock adjustment Patient is dependent with: proper wear schedule/adjustment Donning prosthesis: SBA / verbal cues Doffing prosthesis: Modified independence Prosthetic wear tolerance: 10-12 hours/day, 7 days/week Prosthetic weight bearing tolerance: 10 minutes Residual limb condition: pt denies any issues.  Prosthetic description: silicon liner with suction ring suspension, Microprocessor Knee (MPK), dynamic response foot, Total Contact Socket with flexible inner socket K code/activity level with prosthetic  use: Level 3    TODAY'S  TREATMENT:  DATE:  08/13/2023: Prosthetic Care with Transfemoral Microprocessor Knee (MPK) prosthesis: Work on standing up and walking with less hesitancy upon arising. Turn around towards counter at each end.  On return trip your table should now be on your left side to touch as needed.  Work on this 5-10 times.  Side step with cane & left hand on counter.  Going to right, push off left side when stepping.  Going to left, push off right side.  Place 2 bands on floor between chair & sink with enough room between the 2 bands for left foot can fit.   (You can put 2 pieces of tape on floor instead of bands if you want). Turn so your left side is to sink & Hold cane in right hand place prosthetic foot between the 2 lines: step RIGHT foot so toe steps on band or tape behind the prosthetic foot then step so heel touches band or tape in front of left foot.   Stand facing the sink & open up the lower cabinet doors:  hold cane in right hand and left hand on sink (as you improve try to hoover left hand over sink). Try to touch right toes to lower shelf of cabinet with control 5-10 reps.    Neuromuscular Re-education Turning 180* with cane support (between sink & chair back for safety) 5 reps  Therapeutic Exercise: Back extensors - leaning forward to place hands in chair seat then back upright 5 reps.   PT updated HEP to include above with HO.  Pt & wife verbalized understanding after performing in clinic.    TREATMENT:                                                                                                                             DATE:  08/05/2023: Prosthetic Care with Transfemoral Microprocessor Knee (MPK) prosthesis: See objective data for TUG, Berg & Prosthetic care / wear. Pt amb 130' with cane stand alone tip with CGA. Cues on upright posture  and weight shift over prosthesis in stance.  Worked on pre-gait activity working on weight shift onto prosthesis in stance and RLE terminal stance to initial contact with visual cues for step through pattern with RLE heel passing prosthetic toes.  Patient and wife verbalized understanding how to perform this as HEP at sink at home. Worked on patient initiating gait upon arising with less hesitancy.  Patient position with counter to his left upon arising in case he needs to touch the counter for support, turning 180 degrees both CW and CCW.  Patient able to return demonstration and verbalized understanding his HEP including rationale.   TREATMENT:  DATE:  08/01/2023: Prosthetic Care with Transfemoral Microprocessor Knee (MPK) prosthesis: PT worked on patient's ability to increase speed which will also help program M PK to operate at various speeds.  Patient ambulated with cane and HHA / minA 120' self-selected pace for 5 steps on prosthesis then fast pace for 5 steps on prosthesis.  Progressed to performing verbal gait speeds with rolling walker so that patient is able to perform it outside of PT.  Patient able to demonstrate and return demonstration understanding PT worked on patient's ability to negotiate around obstacles in a weaving pattern.  PT demo, tactile and verbal cues on use of pelvic rotation to orient feet and allow for fluency of motion.  Patient performed activity with cane & HHA/minA and with his RW.  Patient able to verbalize and demonstrate understanding to perform activity to work on skill at home. Patient ambulated 20 feet including sit/stand holding an open bottle of water using cane stand-alone tip with PT close supervision.  Patient verbalized understanding to need to work on the skill at home.  PT and patient also discussed the difference between carrying an item  that requires coordination like the open water versus carrying weighted items.  Patient needs to continue to work on both skills.   PATIENT EDUCATION: PATIENT EDUCATED ON FOLLOWING PROSTHETIC CARE: Education details:   Proper doffing with proper rotation Person educated: Patient and Spouse Education method: Explanation, Demonstration, Tactile cues, and Verbal cues Education comprehension: verbalized understanding and needs further education  HOME EXERCISE PROGRAM: Try to do these exercises 3-5 times per day.   In front of chair with walker in front for safety: Stand up & sit down 5-10 reps without touching walker unless needed for balance. 5-10 reps Ball up piece of paper.  Lower yourself like sitting down to put paper on floor or pick it up.  Raise back upright similar to standing up.  Pick up 5# object using above technique. 5-10 reps  Near counter top or rail on deck / porch.  Walk forward with left hand near counter looking forward, not staring at floor. 5-10 reps Walk backwards with cane and left hand near counter. 5-10 reps Walk sideways with cane and left hand near counter.  Push off your outside leg.  5-10 reps Use above side stepping to work on turning 90*  push off outside leg and turn other leg to face forward to walk down counter.  When turning to your left you will need a chair back across from counter to touch.    Place 4" step in front of sink or rail and chair back across from it Step up on 4" step with right leg and place prosthetic toes over the edge of step. As soon as prosthetic knee starts to bend step down with Right leg.  Turn around towards counter.  5-10 times    At counter:  Place a chair at end of counter so left hand will be on counter when you stand up. Work on standing up and walking with less hesitancy upon arising. Turn around towards counter at each end.  On return trip your table should now be on your left side to touch as needed.  Work on this 5-10  times.  Side step with cane & left hand on counter.  Going to right, push off left side when stepping.  Going to left, push off right side.  Move chair so back is across from sink:  using cane turn from facing sink going  to your left until you are facing the chair back, turn around to face sink going to your right Place 2 bands on floor between chair & sink with enough room between the 2 bands for left foot can fit.   (You can put 2 pieces of tape on floor instead of bands if you want). Turn so your left side is to sink & Hold cane in right hand place prosthetic foot between the 2 lines: step RIGHT foot so toe steps on band or tape behind the prosthetic foot then step so heel touches band or tape in front of left foot.   Turn the chair around to face the sink:  face the chair - bend forward gently placing hands in chair bottom then come back upright.  Do 5-10 times.   Stand facing the sink & open up the lower cabinet doors:  hold cane in right hand and left hand on sink (as you improve try to hoover left hand over sink). Try to touch right toes to lower shelf of cabinet with control 5-10 reps.    ASSESSMENT:  CLINICAL IMPRESSION: Patient appears to understand updated HEP.  He is slowly improving gait with MPK prosthesis.   Patient continues to benefit from skilled PT to maximize function and safety with his MPK prosthesis.    OBJECTIVE IMPAIRMENTS: Abnormal gait, decreased activity tolerance, decreased balance, decreased endurance, decreased knowledge of condition, decreased knowledge of use of DME, decreased mobility, difficulty walking, decreased ROM, decreased strength, and prosthetic dependency .   ACTIVITY LIMITATIONS: carrying, lifting, standing, stairs, transfers, and locomotion level  PARTICIPATION LIMITATIONS: meal prep, community activity, church, and household mobility  PERSONAL FACTORS: Age, Fitness, Past/current experiences, Time since onset of injury/illness/exacerbation, and 3+  comorbidities: see PMH are also affecting patient's functional outcome.   REHAB POTENTIAL: Good  CLINICAL DECISION MAKING: Evolving/moderate complexity  EVALUATION COMPLEXITY: Moderate   GOALS: Goals reviewed with patient? Yes  SHORT TERM GOALS: Target date: 06/27/2023  Patient reports able to don prosthesis with proper rotation modified independent. Baseline: SEE OBJECTIVE DATA Goal status: MET 06/25/2023 2.  Patient able to sit to/from stand engaging hydraulics of prosthetic knee from 18" chair without armrests using BUEs.  Baseline: SEE OBJECTIVE DATA Goal status: MET 06/25/2023  3.  Patient able to pick up object from floor without UE support using prosthetic hydraulics with supervision. Baseline: SEE OBJECTIVE DATA Goal status: MET  06/25/2023  4. Patient ambulates 100' with cane & prosthesis with minA. Baseline: SEE OBJECTIVE DATA Goal status: MET  06/25/2023   LONG TERM GOALS: Target date: 08/28/2023  Patient demonstrates & verbalized understanding of prosthetic care to enable safe utilization of prosthesis. Baseline: SEE OBJECTIVE DATA Goal status: Ongoing   08/13/2023  Patient-Specific Activity Scoring >/= 4/10 Baseline: SEE OBJECTIVE DATA Goal status: Ongoing   08/13/2023  Berg Balance >/= 40/56 (increase by >8 points) to indicate lower fall risk Baseline: SEE OBJECTIVE DATA Goal status:  Ongoing  08/13/2023  Patient ambulates 100' with prosthesis and cane independently Baseline: SEE OBJECTIVE DATA Goal status: Ongoing  08/13/2023  Patient negotiates ramps & curbs with LRAD & prosthesis modified independent.  Baseline: SEE OBJECTIVE DATA Goal status: Ongoing  08/13/2023  Patient negotiates stairs with single rail & prosthesis modified independent.  Baseline: SEE OBJECTIVE DATA Goal status: Ongoing  08/13/2023   PLAN:  PT FREQUENCY: 1x/week  PT DURATION: 90 days  PLANNED INTERVENTIONS: 97164- PT Re-evaluation, 97110-Therapeutic exercises, 97530- Therapeutic  activity, 97112- Neuromuscular re-education, 97535- Self Care, 13086- Gait  training, 16109- Prosthetic training, Patient/Family education, Balance training, and Stair training  PLAN FOR NEXT SESSION:   continue work towards LTGs, gait with cane,  continue to work on prosthetic gait with normal knee movement patterns.   Lorie Rook, PT, DPT 08/13/2023, 1:37 PM     Date of referral: 05/17/2023 Referring provider: Lydia Sams, MD Referring diagnosis? Acquired absence of left leg above knee V49.76 & Z89.612 Treatment diagnosis? (if different than referring diagnosis)  Other abnormalities of gait and mobility ICD-10-CM:  R26.89  Unsteadiness on feet ICD-10-CM:  R26.81  Muscle weakness (generalized) ICD-10-CM:  M62.81  Stiffness of left hip, not elsewhere classified M25.652  Abnormal posture R29.3  What was this (referring dx) caused by? Surgery (Type: AKA)  Nature of Condition: Initial Onset (within last 3 months)  reason for referral delivery of new prosthesis with significant change in design & function within last 3 months   Laterality: Lt  Current Functional Measure Score: Gait Velocity & assist with cane & prosthesis - minA comfortable / self-selected 0.67 ft/sec & fast pace 0.69 ft/sec;  Objective measurements identify impairments when they are compared to normal values, the uninvolved extremity, and prior level of function.  [x]  Yes  []  No  Objective assessment of functional ability: Moderate functional limitations   Briefly describe symptoms: patient is dependent in gait & balance with new Microprocessor Knee prosthesis  How did symptoms start: amputation due to vascular issues  Average pain intensity:  Last 24 hours: 0  Past week: 0  How often does the pt experience symptoms? Frequently  How much have the symptoms interfered with usual daily activities? Moderately  How has condition changed since care began at this facility? NA - initial visit  In general,  how is the patients overall health? Good   BACK PAIN (STarT Back Screening Tool) No

## 2023-08-20 ENCOUNTER — Encounter: Payer: Self-pay | Admitting: Physical Therapy

## 2023-08-20 ENCOUNTER — Ambulatory Visit: Admitting: Physical Therapy

## 2023-08-20 DIAGNOSIS — R2689 Other abnormalities of gait and mobility: Secondary | ICD-10-CM

## 2023-08-20 DIAGNOSIS — M6281 Muscle weakness (generalized): Secondary | ICD-10-CM | POA: Diagnosis not present

## 2023-08-20 DIAGNOSIS — R293 Abnormal posture: Secondary | ICD-10-CM

## 2023-08-20 DIAGNOSIS — R2681 Unsteadiness on feet: Secondary | ICD-10-CM | POA: Diagnosis not present

## 2023-08-20 DIAGNOSIS — M25652 Stiffness of left hip, not elsewhere classified: Secondary | ICD-10-CM

## 2023-08-20 NOTE — Therapy (Signed)
 OUTPATIENT PHYSICAL THERAPY PROSTHETIC TREATMENT   Patient Name: Samuel Carney MRN: [id number] DOB:[birth year], 78 y.o., male Today's Date: 08/20/2023  END OF SESSION:  PT End of Session - 08/20/23 0933     Visit Number 12    Number of Visits 14    Date for PT Re-Evaluation 08/28/23    Authorization Type [employer] Medicare dual complete    Authorization Time Period NO AUTH NEEDED, NO COPAY, 10% COINSURANCE    Progress Note Due on Visit 20    PT Start Time 0930    PT Stop Time 1008    PT Time Calculation (min) 38 min    Equipment Utilized During Treatment Gait belt    Activity Tolerance Patient tolerated treatment well    Behavior During Therapy WFL for tasks assessed/performed              Past Medical History:  Diagnosis Date   Alcohol abuse    Aortic atherosclerosis (HCC)    B12 deficiency    Cirrhosis (HCC)    COPD (chronic obstructive pulmonary disease) (HCC)    Coronary artery disease    GERD (gastroesophageal reflux disease)    Glaucoma    Grade II diastolic dysfunction    History of kidney stones    HLD (hyperlipidemia)    Hx of radiation therapy    Hypertension    Lung mass    Moderate mitral regurgitation    Pneumonia    PVD (peripheral vascular disease) (HCC)    Squamous cell carcinoma of lung, right (HCC) 2019   Past Surgical History:  Procedure Laterality Date   COLONOSCOPY WITH PROPOFOL      COLONOSCOPY WITH PROPOFOL  N/A 11/25/2018   Procedure: COLONOSCOPY WITH PROPOFOL ;  Surgeon: Deveron Fly, MD;  Location: [facility];  Service: Endoscopy;  Laterality: N/A;   COLONOSCOPY WITH PROPOFOL  N/A 11/29/2021   Procedure: COLONOSCOPY WITH PROPOFOL ;  Surgeon: Toledo, Alphonsus Jeans, MD;  Location: [facility];  Service: Gastroenterology;  Laterality: N/A;   ENDARTERECTOMY FEMORAL Left 07/13/2020   Procedure: ENDARTERECTOMY FEMORAL ( SFA STENT);  Surgeon: Jackquelyn Mass, MD;  Location: [facility];  Service: Vascular;  Laterality: Left;    ESOPHAGOGASTRODUODENOSCOPY (EGD) WITH PROPOFOL  N/A 11/25/2018   Procedure: ESOPHAGOGASTRODUODENOSCOPY (EGD) WITH PROPOFOL ;  Surgeon: Deveron Fly, MD;  Location: [facility] ENDOSCOPY;  Service: Endoscopy;  Laterality: N/A;   ESOPHAGOGASTRODUODENOSCOPY (EGD) WITH PROPOFOL  N/A 11/29/2021   Procedure: ESOPHAGOGASTRODUODENOSCOPY (EGD) WITH PROPOFOL ;  Surgeon: Toledo, Alphonsus Jeans, MD;  Location: [facility];  Service: Gastroenterology;  Laterality: N/A;   ESOPHAGOGASTRODUODENOSCOPY (EGD) WITH PROPOFOL  N/A 12/08/2021   Procedure: ESOPHAGOGASTRODUODENOSCOPY (EGD) WITH PROPOFOL ;  Surgeon: Luke Salaam, MD;  Location: [facility] ENDOSCOPY;  Service: Gastroenterology;  Laterality: N/A;   LEG AMPUTATION ABOVE KNEE Left    LOWER EXTREMITY ANGIOGRAPHY Left 06/07/2020   Procedure: LOWER EXTREMITY ANGIOGRAPHY;  Surgeon: Jackquelyn Mass, MD;  Location: [facility];  Service: Cardiovascular;  Laterality: Left;   LOWER EXTREMITY ANGIOGRAPHY Left 01/10/2021   Procedure: LOWER EXTREMITY ANGIOGRAPHY;  Surgeon: Jackquelyn Mass, MD;  Location: [facility];  Service: Cardiovascular;  Laterality: Left;   Patient Active Problem List   Diagnosis Date Noted   UGIB (upper gastrointestinal bleed)    Malnutrition of moderate degree 12/06/2021   Melena 12/05/2021   COPD (chronic obstructive pulmonary disease) (HCC)    Grade II diastolic dysfunction    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), left (HCC)    AKI (acute kidney injury) (HCC)  Atherosclerosis of artery of extremity with ulceration (HCC) 07/13/2020   Atherosclerosis of native arteries of the extremities with ulceration (HCC) 05/19/2020   Alcoholic cirrhosis of 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) 04/09/2019   Hyperlipidemia, mixed 04/09/2019   Malignant 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) 04/09/2019   Thrombocytopenia (HCC) 04/09/2019   CAD (coronary artery disease) 10/14/2017   Lung cancer (HCC) 10/14/2017   Tobacco abuse 08/14/2017    Benign essential hypertension 09/09/2013    PCP: Rory Collard, MD  REFERRING PROVIDER: Lydia Sams, MD  ONSET DATE: 05/14/2023 received new Microprocessor Knee  REFERRING DIAG: Acquired absence of left leg above knee V49.76 & Z89.612  THERAPY DIAG:  Other abnormalities of gait and mobility  Unsteadiness on feet  Muscle weakness (generalized)  Abnormal posture  Stiffness of left hip, not elsewhere classified  Rationale for Evaluation and Treatment: Rehabilitation  SUBJECTIVE:   SUBJECTIVE STATEMENT: He continues to wear prosthesis most of awake hours without issues.  No falls or near falls.   He uses cane in house.  He has been working on PT exercises.  Pt accompanied by: significant other  PERTINENT HISTORY: PAD, CAD, angina, malignant neoplasm of right lung, HLD, alcoholic cirrhosis of liver, HTN, COPD   PAIN:  Are you having pain? No  PRECAUTIONS: None  WEIGHT BEARING RESTRICTIONS: No  FALLS: Has patient fallen in last 6 months? No  LIVING ENVIRONMENT: Lives with: lives with their spouse Lives in: House Home Access: Stairs to enter and Ramped entrance Home layout: One level and single step into washer / dryer Stairs: Yes: Internal: 1 steps; none and External: 2 steps; on left going up Has following equipment at home: Single point cane, Walker - 2 wheeled, Crutches, Wheelchair (manual), shower chair, and Ramped entry  OCCUPATION: retired  PLOF: walks independently with prosthesis and RW in community. He uses cane in home.   PATIENT GOALS:  to learn to use new prosthesis to walk in home and community with cane.   OBJECTIVE:  Patient-Specific Activity Scoring Scheme  "0" represents "unable to perform." "10" represents "able to perform at prior level. 0 1 2 3 4 5 6 7 8 9  10 (Date and Score)   Activity Eval  08/06/23   1. Walking in house with cane  1  6  2. standing balance with ADLs  2  6  3. stairs 2 6  4.    5.    Score 1.67 6   Total  score = sum of the activity scores/number of activities Minimum detectable change (90%CI) for average score = 2 points Minimum detectable change (90%CI) for single activity score = 3 points  COGNITION: Evaluation on 05/30/2023: Overall cognitive status: Within functional limits for tasks assessed  CARDIOVASCULAR RESPONSE: Evaluation on  05/30/2023: Functional activity: gait Pre-activity vitals: HR: 65 SpO2: 97% Post-activity vitals: HR: 86 SpO2: 96% Modified Borg scale for dyspnea: 2: mild shortness of breath  POSTURE: Evaluation on 05/30/2023: weight shift right  LOWER EXTREMITY ROM:  ROM P:passive  A:active Right eval Left eval  Hip flexion    Hip extension  Standing -10*  Hip abduction    Hip adduction    Hip internal rotation    Hip external rotation    Knee flexion    Knee extension    Ankle dorsiflexion    Ankle plantarflexion    Ankle inversion    Ankle eversion     (Blank rows = not tested)  LOWER EXTREMITY MMT:  MMT Right  eval Left eval  Hip flexion    Hip extension    Hip abduction    Hip adduction    Hip internal rotation    Hip external rotation    Knee flexion    Knee extension    Ankle dorsiflexion    Ankle plantarflexion    Ankle inversion    Ankle eversion    At Evaluation all strength testing is grossly seated and functionally standing / gait. (Blank rows = not tested)  TRANSFERS: 06/25/2023:  Patient able to sit to/from stand engaging hydraulics of prosthetic knee from 18" chair without armrests using BUEs  Evaluation on 05/30/2023: Sit to stand: SBA (cues only) using BUEs on seat of 18" chair and stabilizes without UE support. Minor engagement of MPK prosthesis.  Stand to sit: SBA (cues only) using BUEs on seat of 18" chair and stabilizes without UE support. Minor engagement of MPK prosthesis.   FUNCTIONAL TESTs:  08/06/2023: Timed Up & Go: with RW 27.71 sec & with cane 36.81 sec Berg Balance Test:  34/56   06/25/2023:  Patient  able to pick up object from floor without UE support using prosthetic hydraulics with supervision.  Evaluation on 05/30/2023:  Berg Balance Scale: 32/56  GAIT: 06/25/2023:  Patient ambulates 100' with cane & prosthesis with minA.  Evaluation on 05/30/2023: Gait pattern: step through pattern, decreased stance time- Left, decreased hip/knee flexion- Left, antalgic, and trunk flexed Distance walked: 150' Assistive device utilized: with TFA MPK prosthesis Single point cane (minA), Walker - 2 wheeled (modified independent / cues for deviations) Level of assistance: Modified independence, SBA, and Min A Gait velocity: with RW comfortable / self-selected 1.03 ft/sec & fast pace 1.96 ft/sec;  with cane & minA comfortable / self-selected 0.67 ft/sec & fast pace 0.69 ft/sec;  CURRENT PROSTHETIC WEAR ASSESSMENT: 08/06/2023: Patient is independent with: skin check, residual limb care, care of non-amputated limb, prosthetic cleaning, ply sock cleaning, and correct ply sock adjustment Patient is dependent with: proper wear schedule/adjustment Donning prosthesis: Modified independence Doffing prosthesis: Modified independence Prosthetic wear tolerance: >90% of awake hours/day, 7 days/week  06/25/2023: patient able to don suction ring socket with proper rotation.   Evaluation on 05/30/2023: Patient is independent with: skin check, residual limb care, care of non-amputated limb, prosthetic cleaning, ply sock cleaning, and correct ply sock adjustment Patient is dependent with: proper wear schedule/adjustment Donning prosthesis: SBA / verbal cues Doffing prosthesis: Modified independence Prosthetic wear tolerance: 10-12 hours/day, 7 days/week Prosthetic weight bearing tolerance: 10 minutes Residual limb condition: pt denies any issues.  Prosthetic description: silicon liner with suction ring suspension, Microprocessor Knee (MPK), dynamic response foot, Total Contact Socket with flexible inner socket K  code/activity level with prosthetic use: Level 3    TODAY'S  TREATMENT:                                                                                                                             DATE:  08/20/2023: Prosthetic Care with Transfemoral Microprocessor Knee (MPK) prosthesis: PT reviewed standing up & initiating walking with less hesitancy.  Stepping RLE first after his prosthetic knee extends upon arising. 5 reps with cane & 3 reps with RW.  Ambulated while naming words A-Z to work on not having to fully focus on gait. Pt amb 140' with cane with supervision.  PT reviewed donning with hand sanitizer, resetting limb in socket using suction relief button & intimate fitting socket can be snug if he is retaining fluid (salt intake).  Pt verbalized understanding.  Pt amb with LUE sliding on wall using cane RUE 10' X 3 and walking backwards.  Pt verbalized understanding to work on skill at home.   Therapeutic Exercise: PT reviewed standing upright stretch with back to door frame reaching single UE 2 reps 2 deep breaths & BUEs 2 reps 2 deep breaths with recommendation to perform 3-5 times per day (like when exiting bathroom).  Pt return demo & verbalized understanding.     TREATMENT:                                                                                                                             DATE:  08/13/2023: Prosthetic Care with Transfemoral Microprocessor Knee (MPK) prosthesis: Work on standing up and walking with less hesitancy upon arising. Turn around towards counter at each end.  On return trip your table should now be on your left side to touch as needed.  Work on this 5-10 times.  Side step with cane & left hand on counter.  Going to right, push off left side when stepping.  Going to left, push off right side.  Place 2 bands on floor between chair & sink with enough room between the 2 bands for left foot can fit.   (You can put 2 pieces of tape on floor instead of bands  if you want). Turn so your left side is to sink & Hold cane in right hand place prosthetic foot between the 2 lines: step RIGHT foot so toe steps on band or tape behind the prosthetic foot then step so heel touches band or tape in front of left foot.   Stand facing the sink & open up the lower cabinet doors:  hold cane in right hand and left hand on sink (as you improve try to hoover left hand over sink). Try to touch right toes to lower shelf of cabinet with control 5-10 reps.    Neuromuscular Re-education Turning 180* with cane support (between sink & chair back for safety) 5 reps  Therapeutic Exercise: Back extensors - leaning forward to place hands in chair seat then back upright 5 reps.   PT updated HEP to include above with HO.  Pt & wife verbalized understanding after performing in clinic.    TREATMENT:  DATE:  08/05/2023: Prosthetic Care with Transfemoral Microprocessor Knee (MPK) prosthesis: See objective data for TUG, Berg & Prosthetic care / wear. Pt amb 130' with cane stand alone tip with CGA. Cues on upright posture and weight shift over prosthesis in stance.  Worked on pre-gait activity working on weight shift onto prosthesis in stance and RLE terminal stance to initial contact with visual cues for step through pattern with RLE heel passing prosthetic toes.  Patient and wife verbalized understanding how to perform this as HEP at sink at home. Worked on patient initiating gait upon arising with less hesitancy.  Patient position with counter to his left upon arising in case he needs to touch the counter for support, turning 180 degrees both CW and CCW.  Patient able to return demonstration and verbalized understanding his HEP including rationale.   PATIENT EDUCATION: PATIENT EDUCATED ON FOLLOWING PROSTHETIC CARE: Education details:   Proper doffing with proper  rotation Person educated: Patient and Spouse Education method: Explanation, Demonstration, Tactile cues, and Verbal cues Education comprehension: verbalized understanding and needs further education  HOME EXERCISE PROGRAM: Try to do these exercises 3-5 times per day.   In front of chair with walker in front for safety: Stand up & sit down 5-10 reps without touching walker unless needed for balance. 5-10 reps Ball up piece of paper.  Lower yourself like sitting down to put paper on floor or pick it up.  Raise back upright similar to standing up.  Pick up 5# object using above technique. 5-10 reps  Near counter top or rail on deck / porch.  Walk forward with left hand near counter looking forward, not staring at floor. 5-10 reps Walk backwards with cane and left hand near counter. 5-10 reps Walk sideways with cane and left hand near counter.  Push off your outside leg.  5-10 reps Use above side stepping to work on turning 90*  push off outside leg and turn other leg to face forward to walk down counter.  When turning to your left you will need a chair back across from counter to touch.    Place 4" step in front of sink or rail and chair back across from it Step up on 4" step with right leg and place prosthetic toes over the edge of step. As soon as prosthetic knee starts to bend step down with Right leg.  Turn around towards counter.  5-10 times    At counter:  Place a chair at end of counter so left hand will be on counter when you stand up. Work on standing up and walking with less hesitancy upon arising. Turn around towards counter at each end.  On return trip your table should now be on your left side to touch as needed.  Work on this 5-10 times.  Side step with cane & left hand on counter.  Going to right, push off left side when stepping.  Going to left, push off right side.  Move chair so back is across from sink:  using cane turn from facing sink going to your left until you are  facing the chair back, turn around to face sink going to your right Place 2 bands on floor between chair & sink with enough room between the 2 bands for left foot can fit.   (You can put 2 pieces of tape on floor instead of bands if you want). Turn so your left side is to sink & Hold cane in right hand place prosthetic  foot between the 2 lines: step RIGHT foot so toe steps on band or tape behind the prosthetic foot then step so heel touches band or tape in front of left foot.   Turn the chair around to face the sink:  face the chair - bend forward gently placing hands in chair bottom then come back upright.  Do 5-10 times.   Stand facing the sink & open up the lower cabinet doors:  hold cane in right hand and left hand on sink (as you improve try to hoover left hand over sink). Try to touch right toes to lower shelf of cabinet with control 5-10 reps.    ASSESSMENT:  CLINICAL IMPRESSION: PT continues to progress activities with MPK prosthesis & cane which he seems to understand.   Patient continues to benefit from skilled PT to maximize function and safety with his MPK prosthesis.    OBJECTIVE IMPAIRMENTS: Abnormal gait, decreased activity tolerance, decreased balance, decreased endurance, decreased knowledge of condition, decreased knowledge of use of DME, decreased mobility, difficulty walking, decreased ROM, decreased strength, and prosthetic dependency .   ACTIVITY LIMITATIONS: carrying, lifting, standing, stairs, transfers, and locomotion level  PARTICIPATION LIMITATIONS: meal prep, community activity, church, and household mobility  PERSONAL FACTORS: Age, Fitness, Past/current experiences, Time since onset of injury/illness/exacerbation, and 3+ comorbidities: see PMH are also affecting patient's functional outcome.   REHAB POTENTIAL: Good  CLINICAL DECISION MAKING: Evolving/moderate complexity  EVALUATION COMPLEXITY: Moderate   GOALS: Goals reviewed with patient? Yes  SHORT TERM  GOALS: Target date: 06/27/2023  Patient reports able to don prosthesis with proper rotation modified independent. Baseline: SEE OBJECTIVE DATA Goal status: MET 06/25/2023 2.  Patient able to sit to/from stand engaging hydraulics of prosthetic knee from 18" chair without armrests using BUEs.  Baseline: SEE OBJECTIVE DATA Goal status: MET 06/25/2023  3.  Patient able to pick up object from floor without UE support using prosthetic hydraulics with supervision. Baseline: SEE OBJECTIVE DATA Goal status: MET  06/25/2023  4. Patient ambulates 100' with cane & prosthesis with minA. Baseline: SEE OBJECTIVE DATA Goal status: MET  06/25/2023   LONG TERM GOALS: Target date: 08/28/2023  Patient demonstrates & verbalized understanding of prosthetic care to enable safe utilization of prosthesis. Baseline: SEE OBJECTIVE DATA Goal status: Ongoing    08/20/2023  Patient-Specific Activity Scoring >/= 4/10 Baseline: SEE OBJECTIVE DATA Goal status: Ongoing    08/20/2023  Berg Balance >/= 40/56 (increase by >8 points) to indicate lower fall risk Baseline: SEE OBJECTIVE DATA Goal status:  Ongoing   08/20/2023  Patient ambulates 100' with prosthesis and cane independently Baseline: SEE OBJECTIVE DATA Goal status: Ongoing  08/20/2023  Patient negotiates ramps & curbs with LRAD & prosthesis modified independent.  Baseline: SEE OBJECTIVE DATA Goal status: Ongoing   08/20/2023  Patient negotiates stairs with single rail & prosthesis modified independent.  Baseline: SEE OBJECTIVE DATA Goal status: Ongoing  08/20/2023   PLAN:  PT FREQUENCY: 1x/week  PT DURATION: 90 days  PLANNED INTERVENTIONS: 97164- PT Re-evaluation, 97110-Therapeutic exercises, 97530- Therapeutic activity, 97112- Neuromuscular re-education, 97535- Self Care, 91478- Gait training, [phone]- Prosthetic training, Patient/Family education, Balance training, and Stair training  PLAN FOR NEXT SESSION: check LTGs.   Lorie Rook, PT,  DPT 08/20/2023, 1:09 PM     Date of referral: 05/17/2023 Referring provider: Lydia Sams, MD Referring diagnosis? Acquired absence of left leg above knee V49.76 & Z89.612 Treatment diagnosis? (if different than referring diagnosis)  Other abnormalities of gait and mobility ICD-10-CM:  R26.89  Unsteadiness on feet ICD-10-CM:  R26.81  Muscle weakness (generalized) ICD-10-CM:  M62.81  Stiffness of left hip, not elsewhere classified M25.652  Abnormal posture R29.3  What was this (referring dx) caused by? Surgery (Type: AKA)  Nature of Condition: Initial Onset (within last 3 months)  reason for referral delivery of new prosthesis with significant change in design & function within last 3 months   Laterality: Lt  Current Functional Measure Score: Gait Velocity & assist with cane & prosthesis - minA comfortable / self-selected 0.67 ft/sec & fast pace 0.69 ft/sec;  Objective measurements identify impairments when they are compared to normal values, the uninvolved extremity, and prior level of function.  [x]  Yes  []  No  Objective assessment of functional ability: Moderate functional limitations   Briefly describe symptoms: patient is dependent in gait & balance with new Microprocessor Knee prosthesis  How did symptoms start: amputation due to vascular issues  Average pain intensity:  Last 24 hours: 0  Past week: 0  How often does the pt experience symptoms? Frequently  How much have the symptoms interfered with usual daily activities? Moderately  How has condition changed since care began at this facility? NA - initial visit  In general, how is the patients overall health? Good   BACK PAIN (STarT Back Screening Tool) No

## 2023-08-27 ENCOUNTER — Ambulatory Visit: Admitting: Physical Therapy

## 2023-08-27 ENCOUNTER — Encounter: Payer: Self-pay | Admitting: Physical Therapy

## 2023-08-27 DIAGNOSIS — R2689 Other abnormalities of gait and mobility: Secondary | ICD-10-CM

## 2023-08-27 DIAGNOSIS — R2681 Unsteadiness on feet: Secondary | ICD-10-CM

## 2023-08-27 DIAGNOSIS — M25652 Stiffness of left hip, not elsewhere classified: Secondary | ICD-10-CM

## 2023-08-27 DIAGNOSIS — M6281 Muscle weakness (generalized): Secondary | ICD-10-CM

## 2023-08-27 DIAGNOSIS — R293 Abnormal posture: Secondary | ICD-10-CM | POA: Diagnosis not present

## 2023-08-27 NOTE — Addendum Note (Signed)
 Addended by: Marybeth Smock on: 08/27/2023 10:17 AM   Modules accepted: Orders

## 2023-08-27 NOTE — Therapy (Addendum)
 OUTPATIENT PHYSICAL THERAPY PROSTHETIC TREATMENT & Re-certification / Progress Note   Patient Name: Samuel Carney MRN: [id number] DOB:September 09, 1945, 78 y.o., male Today's Date: 08/27/2023  Progress Note Reporting Period 08/06/2023 to 08/27/2023  See note below for Objective Data and Assessment of Progress/Goals.   END OF SESSION:  PT End of Session - 08/27/23 0933     Visit Number 13    Number of Visits 25    Date for PT Re-Evaluation 11/25/23    Authorization Type [employer] Medicare dual complete    Authorization Time Period NO AUTH NEEDED, NO COPAY, 10% COINSURANCE    Progress Note Due on Visit 23    PT Start Time 0930    PT Stop Time 1000    PT Time Calculation (min) 30 min    Equipment Utilized During Treatment Gait belt    Activity Tolerance Patient tolerated treatment well    Behavior During Therapy WFL for tasks assessed/performed             Past Medical History:  Diagnosis Date   Alcohol abuse    Aortic atherosclerosis (HCC)    B12 deficiency    Cirrhosis (HCC)    COPD (chronic obstructive pulmonary disease) (HCC)    Coronary artery disease    GERD (gastroesophageal reflux disease)    Glaucoma    Grade II diastolic dysfunction    History of kidney stones    HLD (hyperlipidemia)    Hx of radiation therapy    Hypertension    Lung mass    Moderate mitral regurgitation    Pneumonia    PVD (peripheral vascular disease) (HCC)    Squamous cell carcinoma of lung, right (HCC) 2019   Past Surgical History:  Procedure Laterality Date   COLONOSCOPY WITH PROPOFOL      COLONOSCOPY WITH PROPOFOL  N/A 11/25/2018   Procedure: COLONOSCOPY WITH PROPOFOL ;  Surgeon: Deveron Fly, MD;  Location: [facility];  Service: Endoscopy;  Laterality: N/A;   COLONOSCOPY WITH PROPOFOL  N/A 11/29/2021   Procedure: COLONOSCOPY WITH PROPOFOL ;  Surgeon: Toledo, Alphonsus Jeans, MD;  Location: [facility];  Service: Gastroenterology;  Laterality: N/A;   ENDARTERECTOMY FEMORAL Left  07/13/2020   Procedure: ENDARTERECTOMY FEMORAL ( SFA STENT);  Surgeon: Jackquelyn Mass, MD;  Location: [facility];  Service: Vascular;  Laterality: Left;   ESOPHAGOGASTRODUODENOSCOPY (EGD) WITH PROPOFOL  N/A 11/25/2018   Procedure: ESOPHAGOGASTRODUODENOSCOPY (EGD) WITH PROPOFOL ;  Surgeon: Deveron Fly, MD;  Location: [facility];  Service: Endoscopy;  Laterality: N/A;   ESOPHAGOGASTRODUODENOSCOPY (EGD) WITH PROPOFOL  N/A 11/29/2021   Procedure: ESOPHAGOGASTRODUODENOSCOPY (EGD) WITH PROPOFOL ;  Surgeon: Toledo, Alphonsus Jeans, MD;  Location: [facility];  Service: Gastroenterology;  Laterality: N/A;   ESOPHAGOGASTRODUODENOSCOPY (EGD) WITH PROPOFOL  N/A 12/08/2021   Procedure: ESOPHAGOGASTRODUODENOSCOPY (EGD) WITH PROPOFOL ;  Surgeon: Luke Salaam, MD;  Location: [facility] END[facility]COPY;  Service: Gastroenterology;  Laterality: N/A;   LEG AMPUTATION ABOVE KNEE Left    LOWER EXTREMITY ANGIOGRAPHY Left 06/07/2020   Procedure: LOWER EXTREMITY ANGIOGRAPHY;  Surgeon: Jackquelyn Mass, MD;  Location: [facility];  Service: Cardiovascular;  Laterality: Left;   LOWER EXTREMITY ANGIOGRAPHY Left 01/10/2021   Procedure: LOWER EXTREMITY ANGIOGRAPHY;  Surgeon: Jackquelyn Mass, MD;  Location: [facility];  Service: Cardiovascular;  Laterality: Left;   Patient Active Problem List   Diagnosis Date Noted   UGIB (upper gastrointestinal bleed)    Malnutrition of moderate degree 12/06/2021   Melena 12/05/2021   COPD (chronic obstructive pulmonary disease) (HCC)    Grade II  diastolic dysfunction    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), left (HCC)    AKI (acute kidney injury) (HCC)    Atherosclerosis of artery of extremity with ulceration (HCC) 07/13/2020   Atherosclerosis of native arteries of the extremities with ulceration (HCC) 05/19/2020   Alcoholic cirrhosis of 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) 04/09/2019   Hyperlipidemia, mixed 04/09/2019   Malignant 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)  04/09/2019   Thrombocytopenia (HCC) 04/09/2019   CAD (coronary artery disease) 10/14/2017   Lung cancer (HCC) 10/14/2017   Tobacco abuse 08/14/2017   Benign essential hypertension 09/09/2013    PCP: Rory Collard, MD  REFERRING PROVIDER: Lydia Sams, MD  ONSET DATE: 05/14/2023 received new Microprocessor Knee  REFERRING DIAG: Acquired absence of left leg above knee V49.76 & Z89.612  THERAPY DIAG:  Other abnormalities of gait and mobility  Unsteadiness on feet  Muscle weakness (generalized)  Abnormal posture  Stiffness of left hip, not elsewhere classified  Rationale for Evaluation and Treatment: Rehabilitation  SUBJECTIVE:   SUBJECTIVE STATEMENT: He has been doing his exercises.   Pt accompanied by: significant other  PERTINENT HISTORY: PAD, CAD, angina, malignant neoplasm of right lung, HLD, alcoholic cirrhosis of liver, HTN, COPD   PAIN:  Are you having pain? No  PRECAUTIONS: None  WEIGHT BEARING RESTRICTIONS: No  FALLS: Has patient fallen in last 6 months? No  LIVING ENVIRONMENT: Lives with: lives with their spouse Lives in: House Home Access: Stairs to enter and Ramped entrance Home layout: One level and single step into washer / dryer Stairs: Yes: Internal: 1 steps; none and External: 2 steps; on left going up Has following equipment at home: Single point cane, Walker - 2 wheeled, Crutches, Wheelchair (manual), shower chair, and Ramped entry  OCCUPATION: retired  PLOF: walks independently with prosthesis and RW in community. He uses cane in home.   PATIENT GOALS:  to learn to use new prosthesis to walk in home and community with cane.   OBJECTIVE:  Patient-Specific Activity Scoring Scheme  "0" represents "unable to perform." "10" represents "able to perform at prior level. 0 1 2 3 4 5 6 7 8 9  10 (Date and Score)   Activity Eval  08/06/23  08/27/23  1. Walking in house with cane  1  6 6   2. standing balance with ADLs  2  6 10   3. stairs 2  6 6   4.     5.     Score 1.67 6 7.33   Total score = sum of the activity scores/number of activities Minimum detectable change (90%CI) for average score = 2 points Minimum detectable change (90%CI) for single activity score = 3 points  COGNITION: Evaluation on 05/30/2023: Overall cognitive status: Within functional limits for tasks assessed  CARDIOVASCULAR RESPONSE: Evaluation on  05/30/2023: Functional activity: gait Pre-activity vitals: HR: 65 SpO2: 97% Post-activity vitals: HR: 86 SpO2: 96% Modified Borg scale for dyspnea: 2: mild shortness of breath  POSTURE: Evaluation on 05/30/2023: weight shift right  LOWER EXTREMITY ROM:  ROM P:passive  A:active Right eval Left eval  Hip flexion    Hip extension  Standing -10*  Hip abduction    Hip adduction    Hip internal rotation    Hip external rotation    Knee flexion    Knee extension    Ankle dorsiflexion    Ankle plantarflexion    Ankle inversion    Ankle eversion     (Blank rows = not  tested)  LOWER EXTREMITY MMT:  MMT Left 08/27/23  Hip flexion   Hip extension 4/5  Hip abduction 4/5  Hip adduction   Hip internal rotation   Hip external rotation   Knee flexion   Knee extension   Ankle dorsiflexion   Ankle plantarflexion   Ankle inversion   Ankle eversion   At Evaluation all strength testing is grossly seated and functionally standing / gait. (Blank rows = not tested)  TRANSFERS: 06/25/2023:  Patient able to sit to/from stand engaging hydraulics of prosthetic knee from 18" chair without armrests using BUEs  Evaluation on 05/30/2023: Sit to stand: SBA (cues only) using BUEs on seat of 18" chair and stabilizes without UE support. Minor engagement of MPK prosthesis.  Stand to sit: SBA (cues only) using BUEs on seat of 18" chair and stabilizes without UE support. Minor engagement of MPK prosthesis.   FUNCTIONAL TESTs:  08/27/2023: Randye Buttner Balance 35/56 Timed Up & Go: with RW 25.71 sec & with cane PT  stopped TUG as pt almost fell.    His prosthetic knee & foot are too externally rotated resulting in prosthetic knee instability.   [facility] PT Assessment - 08/27/23 0930       Standardized Balance Assessment   Standardized Balance Assessment Berg Balance Test      Berg Balance Test   Sit to Stand Able to stand  independently using hands    Standing Unsupported Able to stand safely 2 minutes    Sitting with Back Unsupported but Feet Supported on Floor or Stool Able to sit safely and securely 2 minutes    Stand to Sit Sits safely with minimal use of hands    Transfers Able to transfer safely, definite need of hands    Standing Unsupported with Eyes Closed Able to stand 10 seconds with supervision    Standing Unsupported with Feet Together Able to place feet together independently but unable to hold for 30 seconds    From Standing, Reach Forward with Outstretched Arm Can reach forward >12 cm safely (5")    From Standing Position, Pick up Object from Floor Able to pick up shoe, needs supervision    From Standing Position, Turn to Look Behind Over each Shoulder Looks behind one side only/other side shows less weight shift    Turn 360 Degrees Needs assistance while turning    Standing Unsupported, Alternately Place Feet on Step/Stool Needs assistance to keep from falling or unable to try    Standing Unsupported, One Foot in Front Able to take small step independently and hold 30 seconds    Standing on One Leg Tries to lift leg/unable to hold 3 seconds but remains standing independently    Total Score 35    Berg comment: < 36 high risk for falls (close to 100%) 46-51 moderate (>50%)   37-45 significant (>80%) 52-55 lower (> 25%)              08/06/2023: Timed Up & Go: with RW 27.71 sec & with cane 36.81 sec Berg Balance Test:  34/56  06/25/2023:  Patient able to pick up object from floor without UE support using prosthetic hydraulics with supervision.  Evaluation on 05/30/2023:  Berg Balance  Scale: 32/56  GAIT: 06/25/2023:  Patient ambulates 100' with cane & prosthesis with minA.  Evaluation on 05/30/2023: Gait pattern: step through pattern, decreased stance time- Left, decreased hip/knee flexion- Left, antalgic, and trunk flexed Distance walked: 150' Assistive device utilized: with TFA MPK prosthesis Single point  cane (minA), Walker - 2 wheeled (modified independent / cues for deviations) Level of assistance: Modified independence, SBA, and Min A Gait velocity: with RW comfortable / self-selected 1.03 ft/sec & fast pace 1.96 ft/sec;  with cane & minA comfortable / self-selected 0.67 ft/sec & fast pace 0.69 ft/sec;  CURRENT PROSTHETIC WEAR ASSESSMENT: 08/06/2023: Patient is independent with: skin check, residual limb care, care of non-amputated limb, prosthetic cleaning, ply sock cleaning, and correct ply sock adjustment Patient is dependent with: proper wear schedule/adjustment Donning prosthesis: Modified independence Doffing prosthesis: Modified independence Prosthetic wear tolerance: >90% of awake hours/day, 7 days/week  06/25/2023: patient able to don suction ring socket with proper rotation.   Evaluation on 05/30/2023: Patient is independent with: skin check, residual limb care, care of non-amputated limb, prosthetic cleaning, ply sock cleaning, and correct ply sock adjustment Patient is dependent with: proper wear schedule/adjustment Donning prosthesis: SBA / verbal cues Doffing prosthesis: Modified independence Prosthetic wear tolerance: 10-12 hours/day, 7 days/week Prosthetic weight bearing tolerance: 10 minutes Residual limb condition: pt denies any issues.  Prosthetic description: silicon liner with suction ring suspension, Microprocessor Knee (MPK), dynamic response foot, Total Contact Socket with flexible inner socket K code/activity level with prosthetic use: Level 3    TODAY'S  TREATMENT:                                                                                                                              DATE:  08/27/2023: Prosthetic Care with Transfemoral Microprocessor Knee (MPK) prosthesis: PT began assessing pt's LTGs.  His prosthetic knee was not stable for any functions with cane due to prosthetic knee & foot were externally rotated too far.  PT contacted prosthetist to recommend realigning prosthesis.  PT checked and socket in donned correctly with proper rotation so issue is distal to socket.  Pt & wife in agreement with PT re-certification.    TREATMENT:                                                                                                                             DATE:  08/20/2023: Prosthetic Care with Transfemoral Microprocessor Knee (MPK) prosthesis: PT reviewed standing up & initiating walking with less hesitancy.  Stepping RLE first after his prosthetic knee extends upon arising. 5 reps with cane & 3 reps with RW.  Ambulated while naming words A-Z to work on not having to fully focus on gait. Pt  amb 140' with cane with supervision.  PT reviewed donning with hand sanitizer, resetting limb in socket using suction relief button & intimate fitting socket can be snug if he is retaining fluid (salt intake).  Pt verbalized understanding.  Pt amb with LUE sliding on wall using cane RUE 10' X 3 and walking backwards.  Pt verbalized understanding to work on skill at home.   Therapeutic Exercise: PT reviewed standing upright stretch with back to door frame reaching single UE 2 reps 2 deep breaths & BUEs 2 reps 2 deep breaths with recommendation to perform 3-5 times per day (like when exiting bathroom).  Pt return demo & verbalized understanding.     TREATMENT:                                                                                                                             DATE:  08/13/2023: Prosthetic Care with Transfemoral Microprocessor Knee (MPK) prosthesis: Work on standing up and walking with less hesitancy upon  arising. Turn around towards counter at each end.  On return trip your table should now be on your left side to touch as needed.  Work on this 5-10 times.  Side step with cane & left hand on counter.  Going to right, push off left side when stepping.  Going to left, push off right side.  Place 2 bands on floor between chair & sink with enough room between the 2 bands for left foot can fit.   (You can put 2 pieces of tape on floor instead of bands if you want). Turn so your left side is to sink & Hold cane in right hand place prosthetic foot between the 2 lines: step RIGHT foot so toe steps on band or tape behind the prosthetic foot then step so heel touches band or tape in front of left foot.   Stand facing the sink & open up the lower cabinet doors:  hold cane in right hand and left hand on sink (as you improve try to hoover left hand over sink). Try to touch right toes to lower shelf of cabinet with control 5-10 reps.    Neuromuscular Re-education Turning 180* with cane support (between sink & chair back for safety) 5 reps  Therapeutic Exercise: Back extensors - leaning forward to place hands in chair seat then back upright 5 reps.   PT updated HEP to include above with HO.  Pt & wife verbalized understanding after performing in clinic.     PATIENT EDUCATION: PATIENT EDUCATED ON FOLLOWING PROSTHETIC CARE: Education details:   Proper doffing with proper rotation Person educated: Patient and Spouse Education method: Explanation, Demonstration, Tactile cues, and Verbal cues Education comprehension: verbalized understanding and needs further education  HOME EXERCISE PROGRAM: Try to do these exercises 3-5 times per day.   In front of chair with walker in front for safety: Stand up & sit down 5-10 reps without touching walker unless needed for balance. 5-10  reps Ball up piece of paper.  Lower yourself like sitting down to put paper on floor or pick it up.  Raise back upright similar to  standing up.  Pick up 5# object using above technique. 5-10 reps  Near counter top or rail on deck / porch.  Walk forward with left hand near counter looking forward, not staring at floor. 5-10 reps Walk backwards with cane and left hand near counter. 5-10 reps Walk sideways with cane and left hand near counter.  Push off your outside leg.  5-10 reps Use above side stepping to work on turning 90*  push off outside leg and turn other leg to face forward to walk down counter.  When turning to your left you will need a chair back across from counter to touch.    Place 4" step in front of sink or rail and chair back across from it Step up on 4" step with right leg and place prosthetic toes over the edge of step. As soon as prosthetic knee starts to bend step down with Right leg.  Turn around towards counter.  5-10 times    At counter:  Place a chair at end of counter so left hand will be on counter when you stand up. Work on standing up and walking with less hesitancy upon arising. Turn around towards counter at each end.  On return trip your table should now be on your left side to touch as needed.  Work on this 5-10 times.  Side step with cane & left hand on counter.  Going to right, push off left side when stepping.  Going to left, push off right side.  Move chair so back is across from sink:  using cane turn from facing sink going to your left until you are facing the chair back, turn around to face sink going to your right Place 2 bands on floor between chair & sink with enough room between the 2 bands for left foot can fit.   (You can put 2 pieces of tape on floor instead of bands if you want). Turn so your left side is to sink & Hold cane in right hand place prosthetic foot between the 2 lines: step RIGHT foot so toe steps on band or tape behind the prosthetic foot then step so heel touches band or tape in front of left foot.   Turn the chair around to face the sink:  face the chair - bend  forward gently placing hands in chair bottom then come back upright.  Do 5-10 times.   Stand facing the sink & open up the lower cabinet doors:  hold cane in right hand and left hand on sink (as you improve try to hoover left hand over sink). Try to touch right toes to lower shelf of cabinet with control 5-10 reps.    ASSESSMENT:  CLINICAL IMPRESSION: Pt's prosthetic knee & foot are too externally rotated causing knee instability.  Patient needs further skilled PT to maximize function and safety with his MPK prosthesis.    OBJECTIVE IMPAIRMENTS: Abnormal gait, decreased activity tolerance, decreased balance, decreased endurance, decreased knowledge of condition, decreased knowledge of use of DME, decreased mobility, difficulty walking, decreased ROM, decreased strength, and prosthetic dependency .   ACTIVITY LIMITATIONS: carrying, lifting, standing, stairs, transfers, and locomotion level  PARTICIPATION LIMITATIONS: meal prep, community activity, church, and household mobility  PERSONAL FACTORS: Age, Fitness, Past/current experiences, Time since onset of injury/illness/exacerbation, and 3+ comorbidities: see PMH are also  affecting patient's functional outcome.   REHAB POTENTIAL: Good  CLINICAL DECISION MAKING: Evolving/moderate complexity  EVALUATION COMPLEXITY: Moderate   GOALS: Goals reviewed with patient? Yes  SHORT TERM GOALS: Target date: 06/27/2023  Patient reports able to don prosthesis with proper rotation modified independent. Baseline: SEE OBJECTIVE DATA Goal status: MET 06/25/2023 2.  Patient able to sit to/from stand engaging hydraulics of prosthetic knee from 18" chair without armrests using BUEs.  Baseline: SEE OBJECTIVE DATA Goal status: MET 06/25/2023  3.  Patient able to pick up object from floor without UE support using prosthetic hydraulics with supervision. Baseline: SEE OBJECTIVE DATA Goal status: MET  06/25/2023  4. Patient ambulates 100' with cane &  prosthesis with minA. Baseline: SEE OBJECTIVE DATA Goal status: MET  06/25/2023   LONG TERM GOALS: Target date: 11/25/2023  Patient demonstrates & verbalized understanding of prosthetic care to enable safe utilization of prosthesis. Baseline: SEE OBJECTIVE DATA Goal status: Ongoing   08/27/2023  Patient-Specific Activity Scoring >/= 4/10 Baseline: SEE OBJECTIVE DATA Goal status: Ongoing    08/27/2023  Berg Balance >/= 40/56 (increase by >8 points) to indicate lower fall risk Baseline: SEE OBJECTIVE DATA Goal status:  Ongoing   08/27/2023  Patient ambulates 100' with prosthesis and cane independently Baseline: SEE OBJECTIVE DATA Goal status: Ongoing   08/27/2023  Patient negotiates ramps & curbs with LRAD & prosthesis modified independent.  Baseline: SEE OBJECTIVE DATA Goal status: Ongoing   08/27/2023  Patient negotiates stairs with single rail & prosthesis modified independent.  Baseline: SEE OBJECTIVE DATA Goal status: Ongoing  08/27/2023   PLAN:  PT FREQUENCY: 1x/week  PT DURATION: 90 days  PLANNED INTERVENTIONS: 97164- PT Re-evaluation, 97110-Therapeutic exercises, 97530- Therapeutic activity, 97112- Neuromuscular re-education, 97535- Self Care, 29528- Gait training, [phone]- Prosthetic training, Patient/Family education, Balance training, and Stair training  PLAN FOR NEXT SESSION: check prosthetic alignment.  Progress towards LTGs.   Lorie Rook, PT, DPT 08/27/2023, 10:12 AM     Date of referral: 05/17/2023 Referring provider: Lydia Sams, MD Referring diagnosis? Acquired absence of left leg above knee V49.76 & Z89.612 Treatment diagnosis? (if different than referring diagnosis)  Other abnormalities of gait and mobility ICD-10-CM:  R26.89  Unsteadiness on feet ICD-10-CM:  R26.81  Muscle weakness (generalized) ICD-10-CM:  M62.81  Stiffness of left hip, not elsewhere classified M25.652  Abnormal posture R29.3  What was this (referring dx) caused by? Surgery  (Type: AKA)  Nature of Condition: Initial Onset (within last 3 months)  reason for referral delivery of new prosthesis with significant change in design & function within last 3 months   Laterality: Lt  Current Functional Measure Score: Gait Velocity & assist with cane & prosthesis - minA comfortable / self-selected 0.67 ft/sec & fast pace 0.69 ft/sec;  Objective measurements identify impairments when they are compared to normal values, the uninvolved extremity, and prior level of function.  [x]  Yes  []  No  Objective assessment of functional ability: Moderate functional limitations   Briefly describe symptoms: patient is dependent in gait & balance with new Microprocessor Knee prosthesis  How did symptoms start: amputation due to vascular issues  Average pain intensity:  Last 24 hours: 0  Past week: 0  How often does the pt experience symptoms? Frequently  How much have the symptoms interfered with usual daily activities? Moderately  How has condition changed since care began at this facility? NA - initial visit  In general, how is the patients overall health? Good   BACK PAIN (STarT Back  Screening Tool) No

## 2023-09-16 ENCOUNTER — Ambulatory Visit (INDEPENDENT_AMBULATORY_CARE_PROVIDER_SITE_OTHER): Admitting: Physical Therapy

## 2023-09-16 ENCOUNTER — Encounter: Payer: Self-pay | Admitting: Physical Therapy

## 2023-09-16 DIAGNOSIS — M6281 Muscle weakness (generalized): Secondary | ICD-10-CM | POA: Diagnosis not present

## 2023-09-16 DIAGNOSIS — R293 Abnormal posture: Secondary | ICD-10-CM | POA: Diagnosis not present

## 2023-09-16 DIAGNOSIS — R2689 Other abnormalities of gait and mobility: Secondary | ICD-10-CM

## 2023-09-16 DIAGNOSIS — R2681 Unsteadiness on feet: Secondary | ICD-10-CM

## 2023-09-16 DIAGNOSIS — M25652 Stiffness of left hip, not elsewhere classified: Secondary | ICD-10-CM

## 2023-09-16 NOTE — Therapy (Signed)
 OUTPATIENT PHYSICAL THERAPY PROSTHETIC TREATMENT   Patient Name: Samuel Carney MRN: [id number] DOB:12-14-1945, 78 y.o., male Today's Date: 09/16/2023  END OF SESSION:  PT End of Session - 09/16/23 0932     Visit Number 14    Number of Visits 25    Date for PT Re-Evaluation 11/25/23    Authorization Type [employer] Medicare dual complete    Authorization Time Period NO AUTH NEEDED, NO COPAY, 10% COINSURANCE    Progress Note Due on Visit 23    PT Start Time 0930    PT Stop Time 1012    PT Time Calculation (min) 42 min    Equipment Utilized During Treatment Gait belt    Activity Tolerance Patient tolerated treatment well    Behavior During Therapy WFL for tasks assessed/performed           Past Medical History:  Diagnosis Date   Alcohol abuse    Aortic atherosclerosis (HCC)    B12 deficiency    Cirrhosis (HCC)    COPD (chronic obstructive pulmonary disease) (HCC)    Coronary artery disease    GERD (gastroesophageal reflux disease)    Glaucoma    Grade II diastolic dysfunction    History of kidney stones    HLD (hyperlipidemia)    Hx of radiation therapy    Hypertension    Lung mass    Moderate mitral regurgitation    Pneumonia    PVD (peripheral vascular disease) (HCC)    Squamous cell carcinoma of lung, right (HCC) 2019   Past Surgical History:  Procedure Laterality Date   COLONOSCOPY WITH PROPOFOL      COLONOSCOPY WITH PROPOFOL  N/A 11/25/2018   Procedure: COLONOSCOPY WITH PROPOFOL ;  Surgeon: Deveron Fly, MD;  Location: [facility] ENDOSCOPY;  Service: Endoscopy;  Laterality: N/A;   COLONOSCOPY WITH PROPOFOL  N/A 11/29/2021   Procedure: COLONOSCOPY WITH PROPOFOL ;  Surgeon: Toledo, Alphonsus Jeans, MD;  Location: [facility] ENDOSCOPY;  Service: Gastroenterology;  Laterality: N/A;   ENDARTERECTOMY FEMORAL Left 07/13/2020   Procedure: ENDARTERECTOMY FEMORAL ( SFA STENT);  Surgeon: Jackquelyn Mass, MD;  Location: [facility] ORS;  Service: Vascular;  Laterality: Left;    ESOPHAGOGASTRODUODENOSCOPY (EGD) WITH PROPOFOL  N/A 11/25/2018   Procedure: ESOPHAGOGASTRODUODENOSCOPY (EGD) WITH PROPOFOL ;  Surgeon: Deveron Fly, MD;  Location: [facility] ENDOSCOPY;  Service: Endoscopy;  Laterality: N/A;   ESOPHAGOGASTRODUODENOSCOPY (EGD) WITH PROPOFOL  N/A 11/29/2021   Procedure: ESOPHAGOGASTRODUODENOSCOPY (EGD) WITH PROPOFOL ;  Surgeon: Toledo, Alphonsus Jeans, MD;  Location: [facility] ENDOSCOPY;  Service: Gastroenterology;  Laterality: N/A;   ESOPHAGOGASTRODUODENOSCOPY (EGD) WITH PROPOFOL  N/A 12/08/2021   Procedure: ESOPHAGOGASTRODUODENOSCOPY (EGD) WITH PROPOFOL ;  Surgeon: Luke Salaam, MD;  Location: [facility] ENDOSCOPY;  Service: Gastroenterology;  Laterality: N/A;   LEG AMPUTATION ABOVE KNEE Left    LOWER EXTREMITY ANGIOGRAPHY Left 06/07/2020   Procedure: LOWER EXTREMITY ANGIOGRAPHY;  Surgeon: Jackquelyn Mass, MD;  Location: [facility] INVASIVE CV LAB;  Service: Cardiovascular;  Laterality: Left;   LOWER EXTREMITY ANGIOGRAPHY Left 01/10/2021   Procedure: LOWER EXTREMITY ANGIOGRAPHY;  Surgeon: Jackquelyn Mass, MD;  Location: [facility] INVASIVE CV LAB;  Service: Cardiovascular;  Laterality: Left;   Patient Active Problem List   Diagnosis Date Noted   UGIB (upper gastrointestinal bleed)    Malnutrition of moderate degree 12/06/2021   Melena 12/05/2021   COPD (chronic obstructive pulmonary disease) (HCC)    Grade II diastolic dysfunction    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), left (HCC)    AKI (acute kidney injury) (HCC)    Atherosclerosis  of artery of extremity with ulceration (HCC) 07/13/2020   Atherosclerosis of native arteries of the extremities with ulceration (HCC) 05/19/2020   Alcoholic cirrhosis of 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) 04/09/2019   Hyperlipidemia, mixed 04/09/2019   Malignant 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) 04/09/2019   Thrombocytopenia (HCC) 04/09/2019   CAD (coronary artery disease) 10/14/2017   Lung cancer (HCC) 10/14/2017   Tobacco abuse 08/14/2017    Benign essential hypertension 09/09/2013    PCP: Rory Collard, MD  REFERRING PROVIDER: Lydia Sams, MD  ONSET DATE: 05/14/2023 received new Microprocessor Knee  REFERRING DIAG: Acquired absence of left leg above knee V49.76 & Z89.612  THERAPY DIAG:  Other abnormalities of gait and mobility  Unsteadiness on feet  Muscle weakness (generalized)  Abnormal posture  Stiffness of left hip, not elsewhere classified  Rationale for Evaluation and Treatment: Rehabilitation  SUBJECTIVE:   SUBJECTIVE STATEMENT: He has been RW to leave the house and cane around the house.    Pt accompanied by: significant other  PERTINENT HISTORY: PAD, CAD, angina, malignant neoplasm of right lung, HLD, alcoholic cirrhosis of liver, HTN, COPD   PAIN:  Are you having pain? No  PRECAUTIONS: None  WEIGHT BEARING RESTRICTIONS: No  FALLS: Has patient fallen in last 6 months? No  LIVING ENVIRONMENT: Lives with: lives with their spouse Lives in: House Home Access: Stairs to enter and Ramped entrance Home layout: One level and single step into washer / dryer Stairs: Yes: Internal: 1 steps; none and External: 2 steps; on left going up Has following equipment at home: Single point cane, Walker - 2 wheeled, Crutches, Wheelchair (manual), shower chair, and Ramped entry  OCCUPATION: retired  PLOF: walks independently with prosthesis and RW in community. He uses cane in home.   PATIENT GOALS:  to learn to use new prosthesis to walk in home and community with cane.   OBJECTIVE:  Patient-Specific Activity Scoring Scheme  0 represents "unable to perform." 10 represents "able to perform at prior level. 0 1 2 3 4 5 6 7 8 9  10 (Date and Score)   Activity Eval  08/06/23  08/27/23  1. Walking in house with cane  1  6 6   2. standing balance with ADLs  2  6 10   3. stairs 2 6 6   4.     5.     Score 1.67 6 7.33   Total score = sum of the activity scores/number of activities Minimum detectable  change (90%CI) for average score = 2 points Minimum detectable change (90%CI) for single activity score = 3 points  COGNITION: Evaluation on 05/30/2023: Overall cognitive status: Within functional limits for tasks assessed  CARDIOVASCULAR RESPONSE: Evaluation on  05/30/2023: Functional activity: gait Pre-activity vitals: HR: 65 SpO2: 97% Post-activity vitals: HR: 86 SpO2: 96% Modified Borg scale for dyspnea: 2: mild shortness of breath  POSTURE: Evaluation on 05/30/2023: weight shift right  LOWER EXTREMITY ROM:  ROM P:passive  A:active Right eval Left eval  Hip flexion    Hip extension  Standing -10*  Hip abduction    Hip adduction    Hip internal rotation    Hip external rotation    Knee flexion    Knee extension    Ankle dorsiflexion    Ankle plantarflexion    Ankle inversion    Ankle eversion     (Blank rows = not tested)  LOWER EXTREMITY MMT:  MMT Left 08/27/23  Hip flexion   Hip extension 4/5  Hip  abduction 4/5  Hip adduction   Hip internal rotation   Hip external rotation   Knee flexion   Knee extension   Ankle dorsiflexion   Ankle plantarflexion   Ankle inversion   Ankle eversion   At Evaluation all strength testing is grossly seated and functionally standing / gait. (Blank rows = not tested)  TRANSFERS: 06/25/2023:  Patient able to sit to/from stand engaging hydraulics of prosthetic knee from 18 chair without armrests using BUEs  Evaluation on 05/30/2023: Sit to stand: SBA (cues only) using BUEs on seat of 18 chair and stabilizes without UE support. Minor engagement of MPK prosthesis.  Stand to sit: SBA (cues only) using BUEs on seat of 18 chair and stabilizes without UE support. Minor engagement of MPK prosthesis.   FUNCTIONAL TESTs:  08/27/2023: Randye Buttner Balance 35/56 Timed Up & Go: with RW 25.71 sec & with cane PT stopped TUG as pt almost fell.    His prosthetic knee & foot are too externally rotated resulting in prosthetic knee instability.       08/06/2023: Timed Up & Go: with RW 27.71 sec & with cane 36.81 sec Berg Balance Test:  34/56  06/25/2023:  Patient able to pick up object from floor without UE support using prosthetic hydraulics with supervision.  Evaluation on 05/30/2023:  Berg Balance Scale: 32/56  GAIT: 06/25/2023:  Patient ambulates 100' with cane & prosthesis with minA.  Evaluation on 05/30/2023: Gait pattern: step through pattern, decreased stance time- Left, decreased hip/knee flexion- Left, antalgic, and trunk flexed Distance walked: 150' Assistive device utilized: with TFA MPK prosthesis Single point cane (minA), Walker - 2 wheeled (modified independent / cues for deviations) Level of assistance: Modified independence, SBA, and Min A Gait velocity: with RW comfortable / self-selected 1.03 ft/sec & fast pace 1.96 ft/sec;  with cane & minA comfortable / self-selected 0.67 ft/sec & fast pace 0.69 ft/sec;  CURRENT PROSTHETIC WEAR ASSESSMENT: 08/06/2023: Patient is independent with: skin check, residual limb care, care of non-amputated limb, prosthetic cleaning, ply sock cleaning, and correct ply sock adjustment Patient is dependent with: proper wear schedule/adjustment Donning prosthesis: Modified independence Doffing prosthesis: Modified independence Prosthetic wear tolerance: >90% of awake hours/day, 7 days/week  06/25/2023: patient able to don suction ring socket with proper rotation.   Evaluation on 05/30/2023: Patient is independent with: skin check, residual limb care, care of non-amputated limb, prosthetic cleaning, ply sock cleaning, and correct ply sock adjustment Patient is dependent with: proper wear schedule/adjustment Donning prosthesis: SBA / verbal cues Doffing prosthesis: Modified independence Prosthetic wear tolerance: 10-12 hours/day, 7 days/week Prosthetic weight bearing tolerance: 10 minutes Residual limb condition: pt denies any issues.  Prosthetic description: silicon liner with suction  ring suspension, Microprocessor Knee (MPK), dynamic response foot, Total Contact Socket with flexible inner socket K code/activity level with prosthetic use: Level 3    TODAY'S  TREATMENT:  DATE:  09/16/2023: Prosthetic Care with Transfemoral Microprocessor Knee (MPK) prosthesis: PT cued pt on upright posture (not staring at floor but glancing) and how flexed trunk effects balance during gait.   PT also demo, tactile & verbal cues on not retracting pelvis on left.  Headlights facing forward and left ASIS (headlight) coming forward when advancing prosthesis.  Pt amb 80' X 2 and 37' with cane with CGA working on above two focus points.  PT demo & verbal cues on technique to negotiate curb with cane and how to use car hood for LUE support.  Pt neg 6.5 curb with cane stand alone tip & HHA (open hand to simulate car hood) 5 reps with only assist from HHA.  PT cued during activity.    TREATMENT:                                                                                                                             DATE:  08/27/2023: Prosthetic Care with Transfemoral Microprocessor Knee (MPK) prosthesis: PT began assessing pt's LTGs.  His prosthetic knee was not stable for any functions with cane due to prosthetic knee & foot were externally rotated too far.  PT contacted prosthetist to recommend realigning prosthesis.  PT checked and socket in donned correctly with proper rotation so issue is distal to socket.  Pt & wife in agreement with PT re-certification.    TREATMENT:                                                                                                                             DATE:  08/20/2023: Prosthetic Care with Transfemoral Microprocessor Knee (MPK) prosthesis: PT reviewed standing up & initiating walking with less hesitancy.  Stepping RLE first after  his prosthetic knee extends upon arising. 5 reps with cane & 3 reps with RW.  Ambulated while naming words A-Z to work on not having to fully focus on gait. Pt amb 140' with cane with supervision.  PT reviewed donning with hand sanitizer, resetting limb in socket using suction relief button & intimate fitting socket can be snug if he is retaining fluid (salt intake).  Pt verbalized understanding.  Pt amb with LUE sliding on wall using cane RUE 10' X 3 and walking backwards.  Pt verbalized understanding to work on skill at home.   Therapeutic Exercise: PT reviewed standing upright stretch with back to door frame reaching single UE  2 reps 2 deep breaths & BUEs 2 reps 2 deep breaths with recommendation to perform 3-5 times per day (like when exiting bathroom).  Pt return demo & verbalized understanding.      PATIENT EDUCATION: PATIENT EDUCATED ON FOLLOWING PROSTHETIC CARE: Education details:   Proper doffing with proper rotation Person educated: Patient and Spouse Education method: Explanation, Demonstration, Tactile cues, and Verbal cues Education comprehension: verbalized understanding and needs further education  HOME EXERCISE PROGRAM: Try to do these exercises 3-5 times per day.   In front of chair with walker in front for safety: Stand up & sit down 5-10 reps without touching walker unless needed for balance. 5-10 reps Ball up piece of paper.  Lower yourself like sitting down to put paper on floor or pick it up.  Raise back upright similar to standing up.  Pick up 5# object using above technique. 5-10 reps  Near counter top or rail on deck / porch.  Walk forward with left hand near counter looking forward, not staring at floor. 5-10 reps Walk backwards with cane and left hand near counter. 5-10 reps Walk sideways with cane and left hand near counter.  Push off your outside leg.  5-10 reps Use above side stepping to work on turning 90*  push off outside leg and turn other leg to face  forward to walk down counter.  When turning to your left you will need a chair back across from counter to touch.    Place 4 step in front of sink or rail and chair back across from it Step up on 4 step with right leg and place prosthetic toes over the edge of step. As soon as prosthetic knee starts to bend step down with Right leg.  Turn around towards counter.  5-10 times    At counter:  Place a chair at end of counter so left hand will be on counter when you stand up. Work on standing up and walking with less hesitancy upon arising. Turn around towards counter at each end.  On return trip your table should now be on your left side to touch as needed.  Work on this 5-10 times.  Side step with cane & left hand on counter.  Going to right, push off left side when stepping.  Going to left, push off right side.  Move chair so back is across from sink:  using cane turn from facing sink going to your left until you are facing the chair back, turn around to face sink going to your right Place 2 bands on floor between chair & sink with enough room between the 2 bands for left foot can fit.   (You can put 2 pieces of tape on floor instead of bands if you want). Turn so your left side is to sink & Hold cane in right hand place prosthetic foot between the 2 lines: step RIGHT foot so toe steps on band or tape behind the prosthetic foot then step so heel touches band or tape in front of left foot.   Turn the chair around to face the sink:  face the chair - bend forward gently placing hands in chair bottom then come back upright.  Do 5-10 times.   Stand facing the sink & open up the lower cabinet doors:  hold cane in right hand and left hand on sink (as you improve try to hoover left hand over sink). Try to touch right toes to lower shelf of cabinet with control  5-10 reps.    ASSESSMENT:  CLINICAL IMPRESSION: Patient improved his ability to ambulate without staring at floor and not retract pelvis. Pt appears  to understand how to use car hood to work on negotiating a curb with cane.   Patient continues to benefit from skilled PT to maximize function and safety with his MPK prosthesis.    OBJECTIVE IMPAIRMENTS: Abnormal gait, decreased activity tolerance, decreased balance, decreased endurance, decreased knowledge of condition, decreased knowledge of use of DME, decreased mobility, difficulty walking, decreased ROM, decreased strength, and prosthetic dependency .   ACTIVITY LIMITATIONS: carrying, lifting, standing, stairs, transfers, and locomotion level  PARTICIPATION LIMITATIONS: meal prep, community activity, church, and household mobility  PERSONAL FACTORS: Age, Fitness, Past/current experiences, Time since onset of injury/illness/exacerbation, and 3+ comorbidities: see PMH are also affecting patient's functional outcome.   REHAB POTENTIAL: Good  CLINICAL DECISION MAKING: Evolving/moderate complexity  EVALUATION COMPLEXITY: Moderate   GOALS: Goals reviewed with patient? Yes  SHORT TERM GOALS: Target date: 06/27/2023  Patient reports able to don prosthesis with proper rotation modified independent. Baseline: SEE OBJECTIVE DATA Goal status: MET 06/25/2023 2.  Patient able to sit to/from stand engaging hydraulics of prosthetic knee from 18 chair without armrests using BUEs.  Baseline: SEE OBJECTIVE DATA Goal status: MET 06/25/2023  3.  Patient able to pick up object from floor without UE support using prosthetic hydraulics with supervision. Baseline: SEE OBJECTIVE DATA Goal status: MET  06/25/2023  4. Patient ambulates 100' with cane & prosthesis with minA. Baseline: SEE OBJECTIVE DATA Goal status: MET  06/25/2023   LONG TERM GOALS: Target date: 11/25/2023  Patient demonstrates & verbalized understanding of prosthetic care to enable safe utilization of prosthesis. Baseline: SEE OBJECTIVE DATA Goal status: Ongoing    09/16/2023  Patient-Specific Activity Scoring >/= 4/10 Baseline:  SEE OBJECTIVE DATA Goal status: Ongoing     09/16/2023  Berg Balance >/= 40/56 (increase by >8 points) to indicate lower fall risk Baseline: SEE OBJECTIVE DATA Goal status:  Ongoing    09/16/2023  Patient ambulates 100' with prosthesis and cane independently Baseline: SEE OBJECTIVE DATA Goal status: Ongoing    09/16/2023  Patient negotiates ramps & curbs with LRAD & prosthesis modified independent.  Baseline: SEE OBJECTIVE DATA Goal status: Ongoing    09/16/2023  Patient negotiates stairs with single rail & prosthesis modified independent.  Baseline: SEE OBJECTIVE DATA Goal status: Ongoing  09/16/2023   PLAN:  PT FREQUENCY: 1x/week  PT DURATION: 90 days  PLANNED INTERVENTIONS: 97164- PT Re-evaluation, 97110-Therapeutic exercises, 97530- Therapeutic activity, 97112- Neuromuscular re-education, [phone]- Self Care, 60454- Gait training, [phone]- Prosthetic training, Patient/Family education, Balance training, and Stair training  PLAN FOR NEXT SESSION:   continue to work on gait with cane including some community type activities.   Lorie Rook, PT, DPT 09/16/2023, 10:37 AM     Date of referral: 05/17/2023 Referring provider: Lydia Sams, MD Referring diagnosis? Acquired absence of left leg above knee V49.76 & Z89.612 Treatment diagnosis? (if different than referring diagnosis)  Other abnormalities of gait and mobility ICD-10-CM:  R26.89  Unsteadiness on feet ICD-10-CM:  R26.81  Muscle weakness (generalized) ICD-10-CM:  M62.81  Stiffness of left hip, not elsewhere classified M25.652  Abnormal posture R29.3  What was this (referring dx) caused by? Surgery (Type: AKA)  Nature of Condition: Initial Onset (within last 3 months)  reason for referral delivery of new prosthesis with significant change in design & function within last 3 months   Laterality: Lt  Current Functional Measure  Score: Gait Velocity & assist with cane & prosthesis - minA comfortable / self-selected 0.67  ft/sec & fast pace 0.69 ft/sec;  Objective measurements identify impairments when they are compared to normal values, the uninvolved extremity, and prior level of function.  [x]  Yes  []  No  Objective assessment of functional ability: Moderate functional limitations   Briefly describe symptoms: patient is dependent in gait & balance with new Microprocessor Knee prosthesis  How did symptoms start: amputation due to vascular issues  Average pain intensity:  Last 24 hours: 0  Past week: 0  How often does the pt experience symptoms? Frequently  How much have the symptoms interfered with usual daily activities? Moderately  How has condition changed since care began at this facility? NA - initial visit  In general, how is the patients overall health? Good   BACK PAIN (STarT Back Screening Tool) No

## 2023-09-23 ENCOUNTER — Encounter: Admitting: Physical Therapy

## 2023-09-24 ENCOUNTER — Encounter: Payer: Self-pay | Admitting: Physical Therapy

## 2023-09-24 ENCOUNTER — Ambulatory Visit: Admitting: Physical Therapy

## 2023-09-24 DIAGNOSIS — M6281 Muscle weakness (generalized): Secondary | ICD-10-CM | POA: Diagnosis not present

## 2023-09-24 DIAGNOSIS — R2689 Other abnormalities of gait and mobility: Secondary | ICD-10-CM | POA: Diagnosis not present

## 2023-09-24 DIAGNOSIS — R2681 Unsteadiness on feet: Secondary | ICD-10-CM

## 2023-09-24 DIAGNOSIS — R293 Abnormal posture: Secondary | ICD-10-CM | POA: Diagnosis not present

## 2023-09-24 DIAGNOSIS — M25652 Stiffness of left hip, not elsewhere classified: Secondary | ICD-10-CM

## 2023-09-24 NOTE — Therapy (Signed)
 OUTPATIENT PHYSICAL THERAPY PROSTHETIC TREATMENT   Patient Name: Samuel Carney MRN: [id number] DOB:May 28, 1945, 78 y.o., male Today's Date: 09/24/2023  END OF SESSION:  PT End of Session - 09/24/23 0933     Visit Number 15    Number of Visits 25    Date for PT Re-Evaluation 11/25/23    Authorization Type [employer] Medicare dual complete    Authorization Time Period NO AUTH NEEDED, NO COPAY, 10% COINSURANCE    Progress Note Due on Visit 23    PT Start Time 0930    PT Stop Time 0950    PT Time Calculation (min) 20 min    Equipment Utilized During Treatment Gait belt    Activity Tolerance Patient tolerated treatment well    Behavior During Therapy WFL for tasks assessed/performed            Past Medical History:  Diagnosis Date   Alcohol abuse    Aortic atherosclerosis (HCC)    B12 deficiency    Cirrhosis (HCC)    COPD (chronic obstructive pulmonary disease) (HCC)    Coronary artery disease    GERD (gastroesophageal reflux disease)    Glaucoma    Grade II diastolic dysfunction    History of kidney stones    HLD (hyperlipidemia)    Hx of radiation therapy    Hypertension    Lung mass    Moderate mitral regurgitation    Pneumonia    PVD (peripheral vascular disease) (HCC)    Squamous cell carcinoma of lung, right (HCC) 2019   Past Surgical History:  Procedure Laterality Date   COLONOSCOPY WITH PROPOFOL      COLONOSCOPY WITH PROPOFOL  N/A 11/25/2018   Procedure: COLONOSCOPY WITH PROPOFOL ;  Surgeon: Gaylyn Gladis PENNER, MD;  Location: [facility];  Service: Endoscopy;  Laterality: N/A;   COLONOSCOPY WITH PROPOFOL  N/A 11/29/2021   Procedure: COLONOSCOPY WITH PROPOFOL ;  Surgeon: Toledo, Ladell POUR, MD;  Location: [facility];  Service: Gastroenterology;  Laterality: N/A;   ENDARTERECTOMY FEMORAL Left 07/13/2020   Procedure: ENDARTERECTOMY FEMORAL ( SFA STENT);  Surgeon: Jama Cordella MATSU, MD;  Location: [facility];  Service: Vascular;  Laterality: Left;    ESOPHAGOGASTRODUODENOSCOPY (EGD) WITH PROPOFOL  N/A 11/25/2018   Procedure: ESOPHAGOGASTRODUODENOSCOPY (EGD) WITH PROPOFOL ;  Surgeon: Gaylyn Gladis PENNER, MD;  Location: [facility];  Service: Endoscopy;  Laterality: N/A;   ESOPHAGOGASTRODUODENOSCOPY (EGD) WITH PROPOFOL  N/A 11/29/2021   Procedure: ESOPHAGOGASTRODUODENOSCOPY (EGD) WITH PROPOFOL ;  Surgeon: Toledo, Ladell POUR, MD;  Location: [facility];  Service: Gastroenterology;  Laterality: N/A;   ESOPHAGOGASTRODUODENOSCOPY (EGD) WITH PROPOFOL  N/A 12/08/2021   Procedure: ESOPHAGOGASTRODUODENOSCOPY (EGD) WITH PROPOFOL ;  Surgeon: Therisa Bi, MD;  Location: [facility] ENDOSCOPY;  Service: Gastroenterology;  Laterality: N/A;   LEG AMPUTATION ABOVE KNEE Left    LOWER EXTREMITY ANGIOGRAPHY Left 06/07/2020   Procedure: LOWER EXTREMITY ANGIOGRAPHY;  Surgeon: Jama Cordella MATSU, MD;  Location: [facility];  Service: Cardiovascular;  Laterality: Left;   LOWER EXTREMITY ANGIOGRAPHY Left 01/10/2021   Procedure: LOWER EXTREMITY ANGIOGRAPHY;  Surgeon: Jama Cordella MATSU, MD;  Location: [facility];  Service: Cardiovascular;  Laterality: Left;   Patient Active Problem List   Diagnosis Date Noted   UGIB (upper gastrointestinal bleed)    Malnutrition of moderate degree 12/06/2021   Melena 12/05/2021   COPD (chronic obstructive pulmonary disease) (HCC)    Grade II diastolic dysfunction    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), left (HCC)    AKI (acute kidney injury) (HCC)  Atherosclerosis of artery of extremity with ulceration (HCC) 07/13/2020   Atherosclerosis of native arteries of the extremities with ulceration (HCC) 05/19/2020   Alcoholic cirrhosis of 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) 04/09/2019   Hyperlipidemia, mixed 04/09/2019   Malignant 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) 04/09/2019   Thrombocytopenia (HCC) 04/09/2019   CAD (coronary artery disease) 10/14/2017   Lung cancer (HCC) 10/14/2017   Tobacco abuse 08/14/2017    Benign essential hypertension 09/09/2013    PCP: Glover Lenis, MD  REFERRING PROVIDER: Tobie Burgess Opoka, MD  ONSET DATE: 05/14/2023 received new Microprocessor Knee  REFERRING DIAG: Acquired absence of left leg above knee V49.76 & Z89.612  THERAPY DIAG:  Other abnormalities of gait and mobility  Unsteadiness on feet  Muscle weakness (generalized)  Abnormal posture  Stiffness of left hip, not elsewhere classified  Rationale for Evaluation and Treatment: Rehabilitation  SUBJECTIVE:   SUBJECTIVE STATEMENT: He did curb with cane using car hood without any issues. His prosthesis seems to turning.   Pt accompanied by: significant other  PERTINENT HISTORY: PAD, CAD, angina, malignant neoplasm of right lung, HLD, alcoholic cirrhosis of liver, HTN, COPD   PAIN:  Are you having pain? No  PRECAUTIONS: None  WEIGHT BEARING RESTRICTIONS: No  FALLS: Has patient fallen in last 6 months? No  LIVING ENVIRONMENT: Lives with: lives with their spouse Lives in: House Home Access: Stairs to enter and Ramped entrance Home layout: One level and single step into washer / dryer Stairs: Yes: Internal: 1 steps; none and External: 2 steps; on left going up Has following equipment at home: Single point cane, Walker - 2 wheeled, Crutches, Wheelchair (manual), shower chair, and Ramped entry  OCCUPATION: retired  PLOF: walks independently with prosthesis and RW in community. He uses cane in home.   PATIENT GOALS:  to learn to use new prosthesis to walk in home and community with cane.   OBJECTIVE:  Patient-Specific Activity Scoring Scheme  0 represents "unable to perform." 10 represents "able to perform at prior level. 0 1 2 3 4 5 6 7 8 9  10 (Date and Score)   Activity Eval  08/06/23  08/27/23  1. Walking in house with cane  1  6 6   2. standing balance with ADLs  2  6 10   3. stairs 2 6 6   4.     5.     Score 1.67 6 7.33   Total score = sum of the activity scores/number of  activities Minimum detectable change (90%CI) for average score = 2 points Minimum detectable change (90%CI) for single activity score = 3 points  COGNITION: Evaluation on 05/30/2023: Overall cognitive status: Within functional limits for tasks assessed  CARDIOVASCULAR RESPONSE: Evaluation on  05/30/2023: Functional activity: gait Pre-activity vitals: HR: 65 SpO2: 97% Post-activity vitals: HR: 86 SpO2: 96% Modified Borg scale for dyspnea: 2: mild shortness of breath  POSTURE: Evaluation on 05/30/2023: weight shift right  LOWER EXTREMITY ROM:  ROM P:passive  A:active Right eval Left eval  Hip flexion    Hip extension  Standing -10*  Hip abduction    Hip adduction    Hip internal rotation    Hip external rotation    Knee flexion    Knee extension    Ankle dorsiflexion    Ankle plantarflexion    Ankle inversion    Ankle eversion     (Blank rows = not tested)  LOWER EXTREMITY MMT:  MMT Left 08/27/23  Hip flexion   Hip extension  4/5  Hip abduction 4/5  Hip adduction   Hip internal rotation   Hip external rotation   Knee flexion   Knee extension   Ankle dorsiflexion   Ankle plantarflexion   Ankle inversion   Ankle eversion   At Evaluation all strength testing is grossly seated and functionally standing / gait. (Blank rows = not tested)  TRANSFERS: 06/25/2023:  Patient able to sit to/from stand engaging hydraulics of prosthetic knee from 18 chair without armrests using BUEs  Evaluation on 05/30/2023: Sit to stand: SBA (cues only) using BUEs on seat of 18 chair and stabilizes without UE support. Minor engagement of MPK prosthesis.  Stand to sit: SBA (cues only) using BUEs on seat of 18 chair and stabilizes without UE support. Minor engagement of MPK prosthesis.   FUNCTIONAL TESTs:  08/27/2023: Lars Balance 35/56 Timed Up & Go: with RW 25.71 sec & with cane PT stopped TUG as pt almost fell.    His prosthetic knee & foot are too externally rotated resulting in  prosthetic knee instability.      08/06/2023: Timed Up & Go: with RW 27.71 sec & with cane 36.81 sec Berg Balance Test:  34/56  06/25/2023:  Patient able to pick up object from floor without UE support using prosthetic hydraulics with supervision.  Evaluation on 05/30/2023:  Berg Balance Scale: 32/56  GAIT: 06/25/2023:  Patient ambulates 100' with cane & prosthesis with minA.  Evaluation on 05/30/2023: Gait pattern: step through pattern, decreased stance time- Left, decreased hip/knee flexion- Left, antalgic, and trunk flexed Distance walked: 150' Assistive device utilized: with TFA MPK prosthesis Single point cane (minA), Walker - 2 wheeled (modified independent / cues for deviations) Level of assistance: Modified independence, SBA, and Min A Gait velocity: with RW comfortable / self-selected 1.03 ft/sec & fast pace 1.96 ft/sec;  with cane & minA comfortable / self-selected 0.67 ft/sec & fast pace 0.69 ft/sec;  CURRENT PROSTHETIC WEAR ASSESSMENT: 08/06/2023: Patient is independent with: skin check, residual limb care, care of non-amputated limb, prosthetic cleaning, ply sock cleaning, and correct ply sock adjustment Patient is dependent with: proper wear schedule/adjustment Donning prosthesis: Modified independence Doffing prosthesis: Modified independence Prosthetic wear tolerance: >90% of awake hours/day, 7 days/week  06/25/2023: patient able to don suction ring socket with proper rotation.   Evaluation on 05/30/2023: Patient is independent with: skin check, residual limb care, care of non-amputated limb, prosthetic cleaning, ply sock cleaning, and correct ply sock adjustment Patient is dependent with: proper wear schedule/adjustment Donning prosthesis: SBA / verbal cues Doffing prosthesis: Modified independence Prosthetic wear tolerance: 10-12 hours/day, 7 days/week Prosthetic weight bearing tolerance: 10 minutes Residual limb condition: pt denies any issues.  Prosthetic  description: silicon liner with suction ring suspension, Microprocessor Knee (MPK), dynamic response foot, Total Contact Socket with flexible inner socket K code/activity level with prosthetic use: Level 3    TODAY'S  TREATMENT:  DATE:  09/24/2023: Prosthetic Care with Transfemoral Microprocessor Knee (MPK) prosthesis: His socket and lower prosthetic portion are able to be rotated.  PT called Amy W regarding issue. She set appt for 11:00 today. Pt amb 80' X 2 with cane stand alone tip with CGA until prosthesis began to rotate so HHA. PT ended session until he can see prosthetist due to safety.  PT advised to use RW only to exit PT and enter her office until prosthesis is repaired.  He verbalized understanding.    TREATMENT:                                                                                                                             DATE:  09/16/2023: Prosthetic Care with Transfemoral Microprocessor Knee (MPK) prosthesis: PT cued pt on upright posture (not staring at floor but glancing) and how flexed trunk effects balance during gait.   PT also demo, tactile & verbal cues on not retracting pelvis on left.  Headlights facing forward and left ASIS (headlight) coming forward when advancing prosthesis.  Pt amb 80' X 2 and 37' with cane with CGA working on above two focus points.  PT demo & verbal cues on technique to negotiate curb with cane and how to use car hood for LUE support.  Pt neg 6.5 curb with cane stand alone tip & HHA (open hand to simulate car hood) 5 reps with only assist from HHA.  PT cued during activity.    TREATMENT:                                                                                                                             DATE:  08/27/2023: Prosthetic Care with Transfemoral Microprocessor Knee (MPK) prosthesis: PT began  assessing pt's LTGs.  His prosthetic knee was not stable for any functions with cane due to prosthetic knee & foot were externally rotated too far.  PT contacted prosthetist to recommend realigning prosthesis.  PT checked and socket in donned correctly with proper rotation so issue is distal to socket.  Pt & wife in agreement with PT re-certification.      PATIENT EDUCATION: PATIENT EDUCATED ON FOLLOWING PROSTHETIC CARE: Education details:   Proper doffing with proper rotation Person educated: Patient and Spouse Education method: Explanation, Demonstration, Tactile cues, and Verbal cues Education comprehension: verbalized understanding and needs further education  HOME EXERCISE PROGRAM: Try to do these exercises 3-5  times per day.   In front of chair with walker in front for safety: Stand up & sit down 5-10 reps without touching walker unless needed for balance. 5-10 reps Ball up piece of paper.  Lower yourself like sitting down to put paper on floor or pick it up.  Raise back upright similar to standing up.  Pick up 5# object using above technique. 5-10 reps  Near counter top or rail on deck / porch.  Walk forward with left hand near counter looking forward, not staring at floor. 5-10 reps Walk backwards with cane and left hand near counter. 5-10 reps Walk sideways with cane and left hand near counter.  Push off your outside leg.  5-10 reps Use above side stepping to work on turning 90*  push off outside leg and turn other leg to face forward to walk down counter.  When turning to your left you will need a chair back across from counter to touch.    Place 4 step in front of sink or rail and chair back across from it Step up on 4 step with right leg and place prosthetic toes over the edge of step. As soon as prosthetic knee starts to bend step down with Right leg.  Turn around towards counter.  5-10 times    At counter:  Place a chair at end of counter so left hand will be on  counter when you stand up. Work on standing up and walking with less hesitancy upon arising. Turn around towards counter at each end.  On return trip your table should now be on your left side to touch as needed.  Work on this 5-10 times.  Side step with cane & left hand on counter.  Going to right, push off left side when stepping.  Going to left, push off right side.  Move chair so back is across from sink:  using cane turn from facing sink going to your left until you are facing the chair back, turn around to face sink going to your right Place 2 bands on floor between chair & sink with enough room between the 2 bands for left foot can fit.   (You can put 2 pieces of tape on floor instead of bands if you want). Turn so your left side is to sink & Hold cane in right hand place prosthetic foot between the 2 lines: step RIGHT foot so toe steps on band or tape behind the prosthetic foot then step so heel touches band or tape in front of left foot.   Turn the chair around to face the sink:  face the chair - bend forward gently placing hands in chair bottom then come back upright.  Do 5-10 times.   Stand facing the sink & open up the lower cabinet doors:  hold cane in right hand and left hand on sink (as you improve try to hoover left hand over sink). Try to touch right toes to lower shelf of cabinet with control 5-10 reps.    ASSESSMENT:  CLINICAL IMPRESSION: Prosthesis has a loose or broken screw making it unsafe for ambulation until is repaired.  Patient continues to benefit from skilled PT to maximize function and safety with his MPK prosthesis.    OBJECTIVE IMPAIRMENTS: Abnormal gait, decreased activity tolerance, decreased balance, decreased endurance, decreased knowledge of condition, decreased knowledge of use of DME, decreased mobility, difficulty walking, decreased ROM, decreased strength, and prosthetic dependency .   ACTIVITY LIMITATIONS: carrying, lifting, standing, stairs,  transfers, and  locomotion level  PARTICIPATION LIMITATIONS: meal prep, community activity, church, and household mobility  PERSONAL FACTORS: Age, Fitness, Past/current experiences, Time since onset of injury/illness/exacerbation, and 3+ comorbidities: see PMH are also affecting patient's functional outcome.   REHAB POTENTIAL: Good  CLINICAL DECISION MAKING: Evolving/moderate complexity  EVALUATION COMPLEXITY: Moderate   GOALS: Goals reviewed with patient? Yes  SHORT TERM GOALS: Target date: 06/27/2023  Patient reports able to don prosthesis with proper rotation modified independent. Baseline: SEE OBJECTIVE DATA Goal status: MET 06/25/2023 2.  Patient able to sit to/from stand engaging hydraulics of prosthetic knee from 18 chair without armrests using BUEs.  Baseline: SEE OBJECTIVE DATA Goal status: MET 06/25/2023  3.  Patient able to pick up object from floor without UE support using prosthetic hydraulics with supervision. Baseline: SEE OBJECTIVE DATA Goal status: MET  06/25/2023  4. Patient ambulates 100' with cane & prosthesis with minA. Baseline: SEE OBJECTIVE DATA Goal status: MET  06/25/2023   LONG TERM GOALS: Target date: 11/25/2023  Patient demonstrates & verbalized understanding of prosthetic care to enable safe utilization of prosthesis. Baseline: SEE OBJECTIVE DATA Goal status: Ongoing    09/24/2023  Patient-Specific Activity Scoring >/= 4/10 Baseline: SEE OBJECTIVE DATA Goal status: Ongoing     09/24/2023  Berg Balance >/= 40/56 (increase by >8 points) to indicate lower fall risk Baseline: SEE OBJECTIVE DATA Goal status:  Ongoing     09/24/2023  Patient ambulates 100' with prosthesis and cane independently Baseline: SEE OBJECTIVE DATA Goal status: Ongoing    09/24/2023  Patient negotiates ramps & curbs with LRAD & prosthesis modified independent.  Baseline: SEE OBJECTIVE DATA Goal status: Ongoing    09/24/2023  Patient negotiates stairs with single rail & prosthesis modified  independent.  Baseline: SEE OBJECTIVE DATA Goal status: Ongoing  09/24/2023   PLAN:  PT FREQUENCY: 1x/week  PT DURATION: 90 days  PLANNED INTERVENTIONS: 97164- PT Re-evaluation, 97110-Therapeutic exercises, 97530- Therapeutic activity, 97112- Neuromuscular re-education, [id number]- Self Care, [id number]- Gait training, [phone]- Prosthetic training, Patient/Family education, Balance training, and Stair training  PLAN FOR NEXT SESSION:   check how prosthetist appt went,  introduce standing theraband kicks / stepping,   continue to work on gait with cane including some community type activities.   Grayce Spatz, PT, DPT 09/24/2023, 10:00 AM     Date of referral: 05/17/2023 Referring provider: Burgess Shirlyn Blanch, MD Referring diagnosis? Acquired absence of left leg above knee V49.76 & Z89.612 Treatment diagnosis? (if different than referring diagnosis)  Other abnormalities of gait and mobility ICD-10-CM:  R26.89  Unsteadiness on feet ICD-10-CM:  R26.81  Muscle weakness (generalized) ICD-10-CM:  M62.81  Stiffness of left hip, not elsewhere classified M25.652  Abnormal posture R29.3  What was this (referring dx) caused by? Surgery (Type: AKA)  Nature of Condition: Initial Onset (within last 3 months)  reason for referral delivery of new prosthesis with significant change in design & function within last 3 months   Laterality: Lt  Current Functional Measure Score: Gait Velocity & assist with cane & prosthesis - minA comfortable / self-selected 0.67 ft/sec & fast pace 0.69 ft/sec;  Objective measurements identify impairments when they are compared to normal values, the uninvolved extremity, and prior level of function.  [x]  Yes  []  No  Objective assessment of functional ability: Moderate functional limitations   Briefly describe symptoms: patient is dependent in gait & balance with new Microprocessor Knee prosthesis  How did symptoms start: amputation due to vascular issues  Average pain  intensity:  Last 24 hours: 0  Past week: 0  How often does the pt experience symptoms? Frequently  How much have the symptoms interfered with usual daily activities? Moderately  How has condition changed since care began at this facility? NA - initial visit  In general, how is the patients overall health? Good   BACK PAIN (STarT Back Screening Tool) No

## 2023-09-30 ENCOUNTER — Encounter: Payer: Self-pay | Admitting: Physical Therapy

## 2023-09-30 ENCOUNTER — Ambulatory Visit: Admitting: Physical Therapy

## 2023-09-30 DIAGNOSIS — R293 Abnormal posture: Secondary | ICD-10-CM

## 2023-09-30 DIAGNOSIS — R2689 Other abnormalities of gait and mobility: Secondary | ICD-10-CM

## 2023-09-30 DIAGNOSIS — M6281 Muscle weakness (generalized): Secondary | ICD-10-CM

## 2023-09-30 DIAGNOSIS — R2681 Unsteadiness on feet: Secondary | ICD-10-CM | POA: Diagnosis not present

## 2023-09-30 DIAGNOSIS — M25652 Stiffness of left hip, not elsewhere classified: Secondary | ICD-10-CM

## 2023-09-30 NOTE — Therapy (Signed)
 OUTPATIENT PHYSICAL THERAPY PROSTHETIC TREATMENT   Patient Name: Samuel Carney MRN: [id number] DOB:1945/05/24, 78 y.o., male Today's Date: 09/30/2023  END OF SESSION:  PT End of Session - 09/30/23 0934     Visit Number 16    Number of Visits 25    Date for PT Re-Evaluation 11/25/23    Authorization Type [employer] Medicare dual complete    Authorization Time Period NO AUTH NEEDED, NO COPAY, 10% COINSURANCE    Progress Note Due on Visit 23    PT Start Time 0930    PT Stop Time 1011    PT Time Calculation (min) 41 min    Equipment Utilized During Treatment Gait belt    Activity Tolerance Patient tolerated treatment well    Behavior During Therapy WFL for tasks assessed/performed             Past Medical History:  Diagnosis Date   Alcohol abuse    Aortic atherosclerosis (HCC)    B12 deficiency    Cirrhosis (HCC)    COPD (chronic obstructive pulmonary disease) (HCC)    Coronary artery disease    GERD (gastroesophageal reflux disease)    Glaucoma    Grade II diastolic dysfunction    History of kidney stones    HLD (hyperlipidemia)    Hx of radiation therapy    Hypertension    Lung mass    Moderate mitral regurgitation    Pneumonia    PVD (peripheral vascular disease) (HCC)    Squamous cell carcinoma of lung, right (HCC) 2019   Past Surgical History:  Procedure Laterality Date   COLONOSCOPY WITH PROPOFOL      COLONOSCOPY WITH PROPOFOL  N/A 11/25/2018   Procedure: COLONOSCOPY WITH PROPOFOL ;  Surgeon: Gaylyn Gladis PENNER, MD;  Location: [facility];  Service: Endoscopy;  Laterality: N/A;   COLONOSCOPY WITH PROPOFOL  N/A 11/29/2021   Procedure: COLONOSCOPY WITH PROPOFOL ;  Surgeon: Toledo, Ladell POUR, MD;  Location: [facility] ENDOSCOPY;  Service: Gastroenterology;  Laterality: N/A;   ENDARTERECTOMY FEMORAL Left 07/13/2020   Procedure: ENDARTERECTOMY FEMORAL ( SFA STENT);  Surgeon: Jama Cordella MATSU, MD;  Location: [facility] OR[facility];  Service: Vascular;  Laterality: Left;    ESOPHAGOGASTRODUODENOSCOPY (EGD) WITH PROPOFOL  N/A 11/25/2018   Procedure: ESOPHAGOGASTRODUODENOSCOPY (EGD) WITH PROPOFOL ;  Surgeon: Gaylyn Gladis PENNER, MD;  Location: [facility] ENDOSCOPY;  Service: Endoscopy;  Laterality: N/A;   ESOPHAGOGASTRODUODENOSCOPY (EGD) WITH PROPOFOL  N/A 11/29/2021   Procedure: ESOPHAGOGASTRODUODENOSCOPY (EGD) WITH PROPOFOL ;  Surgeon: Toledo, Ladell POUR, MD;  Location: [facility] ENDOSCOPY;  Service: Gastroenterology;  Laterality: N/A;   ESOPHAGOGASTRODUODENOSCOPY (EGD) WITH PROPOFOL  N/A 12/08/2021   Procedure: ESOPHAGOGASTRODUODENOSCOPY (EGD) WITH PROPOFOL ;  Surgeon: Therisa Bi, MD;  Location: [facility] ENDOSCOPY;  Service: Gastroenterology;  Laterality: N/A;   LEG AMPUTATION ABOVE KNEE Left    LOWER EXTREMITY ANGIOGRAPHY Left 06/07/2020   Procedure: LOWER EXTREMITY ANGIOGRAPHY;  Surgeon: Jama Cordella MATSU, MD;  Location: [facility];  Service: Cardiovascular;  Laterality: Left;   LOWER EXTREMITY ANGIOGRAPHY Left 01/10/2021   Procedure: LOWER EXTREMITY ANGIOGRAPHY;  Surgeon: Jama Cordella MATSU, MD;  Location: [facility];  Service: Cardiovascular;  Laterality: Left;   Patient Active Problem List   Diagnosis Date Noted   UGIB (upper gastrointestinal bleed)    Malnutrition of moderate degree 12/06/2021   Melena 12/05/2021   COPD (chronic obstructive pulmonary disease) (HCC)    Grade II diastolic dysfunction    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), left (HCC)    AKI (acute kidney injury) (HCC)  Atherosclerosis of artery of extremity with ulceration (HCC) 07/13/2020   Atherosclerosis of native arteries of the extremities with ulceration (HCC) 05/19/2020   Alcoholic cirrhosis of 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) 04/09/2019   Hyperlipidemia, mixed 04/09/2019   Malignant 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) 04/09/2019   Thrombocytopenia (HCC) 04/09/2019   CAD (coronary artery disease) 10/14/2017   Lung cancer (HCC) 10/14/2017   Tobacco abuse 08/14/2017    Benign essential hypertension 09/09/2013    PCP: Glover Lenis, MD  REFERRING PROVIDER: Tobie Burgess Opoka, MD  ONSET DATE: 05/14/2023 received new Microprocessor Knee  REFERRING DIAG: Acquired absence of left leg above knee V49.76 & Z89.612  THERAPY DIAG:  Other abnormalities of gait and mobility  Unsteadiness on feet  Muscle weakness (generalized)  Abnormal posture  Stiffness of left hip, not elsewhere classified  Rationale for Evaluation and Treatment: Rehabilitation  SUBJECTIVE:   SUBJECTIVE STATEMENT: He saw prosthetist after last PT appt and she tighten all rotation screws.  He has not had sensation that the prosthesis was turning any more.  Pt accompanied by: significant other  PERTINENT HISTORY: PAD, CAD, angina, malignant neoplasm of right lung, HLD, alcoholic cirrhosis of liver, HTN, COPD   PAIN:  Are you having pain? No  PRECAUTIONS: None  WEIGHT BEARING RESTRICTIONS: No  FALLS: Has patient fallen in last 6 months? No  LIVING ENVIRONMENT: Lives with: lives with their spouse Lives in: House Home Access: Stairs to enter and Ramped entrance Home layout: One level and single step into washer / dryer Stairs: Yes: Internal: 1 steps; none and External: 2 steps; on left going up Has following equipment at home: Single point cane, Walker - 2 wheeled, Crutches, Wheelchair (manual), shower chair, and Ramped entry  OCCUPATION: retired  PLOF: walks independently with prosthesis and RW in community. He uses cane in home.   PATIENT GOALS:  to learn to use new prosthesis to walk in home and community with cane.   OBJECTIVE:  Patient-Specific Activity Scoring Scheme  0 represents "unable to perform." 10 represents "able to perform at prior level. 0 1 2 3 4 5 6 7 8 9  10 (Date and Score)   Activity Eval  08/06/23  08/27/23  1. Walking in house with cane  1  6 6   2. standing balance with ADLs  2  6 10   3. stairs 2 6 6   4.     5.     Score 1.67 6 7.33    Total score = sum of the activity scores/number of activities Minimum detectable change (90%CI) for average score = 2 points Minimum detectable change (90%CI) for single activity score = 3 points  COGNITION: Evaluation on 05/30/2023: Overall cognitive status: Within functional limits for tasks assessed  CARDIOVASCULAR RESPONSE: Evaluation on  05/30/2023: Functional activity: gait Pre-activity vitals: HR: 65 SpO2: 97% Post-activity vitals: HR: 86 SpO2: 96% Modified Borg scale for dyspnea: 2: mild shortness of breath  POSTURE: Evaluation on 05/30/2023: weight shift right  LOWER EXTREMITY ROM:  ROM P:passive  A:active Right eval Left eval  Hip flexion    Hip extension  Standing -10*  Hip abduction    Hip adduction    Hip internal rotation    Hip external rotation    Knee flexion    Knee extension    Ankle dorsiflexion    Ankle plantarflexion    Ankle inversion    Ankle eversion     (Blank rows = not tested)  LOWER EXTREMITY MMT:  MMT  Left 08/27/23  Hip flexion   Hip extension 4/5  Hip abduction 4/5  Hip adduction   Hip internal rotation   Hip external rotation   Knee flexion   Knee extension   Ankle dorsiflexion   Ankle plantarflexion   Ankle inversion   Ankle eversion   At Evaluation all strength testing is grossly seated and functionally standing / gait. (Blank rows = not tested)  TRANSFERS: 06/25/2023:  Patient able to sit to/from stand engaging hydraulics of prosthetic knee from 18 chair without armrests using BUEs  Evaluation on 05/30/2023: Sit to stand: SBA (cues only) using BUEs on seat of 18 chair and stabilizes without UE support. Minor engagement of MPK prosthesis.  Stand to sit: SBA (cues only) using BUEs on seat of 18 chair and stabilizes without UE support. Minor engagement of MPK prosthesis.   FUNCTIONAL TESTs:  08/27/2023: Lars Balance 35/56 Timed Up & Go: with RW 25.71 sec & with cane PT stopped TUG as pt almost fell.    His  prosthetic knee & foot are too externally rotated resulting in prosthetic knee instability.      08/06/2023: Timed Up & Go: with RW 27.71 sec & with cane 36.81 sec Berg Balance Test:  34/56  06/25/2023:  Patient able to pick up object from floor without UE support using prosthetic hydraulics with supervision.  Evaluation on 05/30/2023:  Berg Balance Scale: 32/56  GAIT: 06/25/2023:  Patient ambulates 100' with cane & prosthesis with minA.  Evaluation on 05/30/2023: Gait pattern: step through pattern, decreased stance time- Left, decreased hip/knee flexion- Left, antalgic, and trunk flexed Distance walked: 150' Assistive device utilized: with TFA MPK prosthesis Single point cane (minA), Walker - 2 wheeled (modified independent / cues for deviations) Level of assistance: Modified independence, SBA, and Min A Gait velocity: with RW comfortable / self-selected 1.03 ft/sec & fast pace 1.96 ft/sec;  with cane & minA comfortable / self-selected 0.67 ft/sec & fast pace 0.69 ft/sec;  CURRENT PROSTHETIC WEAR ASSESSMENT: 08/06/2023: Patient is independent with: skin check, residual limb care, care of non-amputated limb, prosthetic cleaning, ply sock cleaning, and correct ply sock adjustment Patient is dependent with: proper wear schedule/adjustment Donning prosthesis: Modified independence Doffing prosthesis: Modified independence Prosthetic wear tolerance: >90% of awake hours/day, 7 days/week  06/25/2023: patient able to don suction ring socket with proper rotation.   Evaluation on 05/30/2023: Patient is independent with: skin check, residual limb care, care of non-amputated limb, prosthetic cleaning, ply sock cleaning, and correct ply sock adjustment Patient is dependent with: proper wear schedule/adjustment Donning prosthesis: SBA / verbal cues Doffing prosthesis: Modified independence Prosthetic wear tolerance: 10-12 hours/day, 7 days/week Prosthetic weight bearing tolerance: 10  minutes Residual limb condition: pt denies any issues.  Prosthetic description: silicon liner with suction ring suspension, Microprocessor Knee (MPK), dynamic response foot, Total Contact Socket with flexible inner socket K code/activity level with prosthetic use: Level 3    TODAY'S  TREATMENT:  DATE:  09/30/2023: Prosthetic Care with Transfemoral Microprocessor Knee (MPK) prosthesis: Pt amb 80' X 2 with cane with CGA   Pt neg curb with cane with minA.  He reports practicing using cane & car hood which is smoother than today in PT.    Therapeutic Exercises - Standing Hip Abduction with green Theraband Resistance  - 1 sets - 10 reps - Standing Hip Extension with green Theraband Resistance - 1 sets - 10 reps - Standing Hip Adduction with green Theraband Resistance (Mirrored)  1 sets - 10 reps - Standing Hip Flexion with green Theraband Resistance (Mirrored)  1 sets - 10 reps - Standing posture with back to counter 1 reps - 3 minutes hold - Upright Stance at Door Frame Single Arm  2 reps - 2 deep breathes hold - Upright Stance at Door Frame with Both Arms  2 reps - 2 deep breathes hold - Modified Thomas Stretch   2 reps - 30 seconds hold PT updated and reviewed HEP with HO, demo & verbal cues.  Focus on upright posture.  Pt & wife verbalized understanding following performing in clinic.   TREATMENT:                                                                                                                             DATE:  09/24/2023: Prosthetic Care with Transfemoral Microprocessor Knee (MPK) prosthesis: His socket and lower prosthetic portion are able to be rotated.  PT called Amy W regarding issue. She set appt for 11:00 today. Pt amb 80' X 2 with cane stand alone tip with CGA until prosthesis began to rotate so HHA. PT ended session until he can see prosthetist  due to safety.  PT advised to use RW only to exit PT and enter her office until prosthesis is repaired.  He verbalized understanding.    TREATMENT:                                                                                                                             DATE:  09/16/2023: Prosthetic Care with Transfemoral Microprocessor Knee (MPK) prosthesis: PT cued pt on upright posture (not staring at floor but glancing) and how flexed trunk effects balance during gait.   PT also demo, tactile & verbal cues on not retracting pelvis on left.  Headlights facing forward and left ASIS (headlight) coming forward when advancing prosthesis.  Pt amb 80' X  2 and 25' with cane with CGA working on above two focus points.  PT demo & verbal cues on technique to negotiate curb with cane and how to use car hood for LUE support.  Pt neg 6.5 curb with cane stand alone tip & HHA (open hand to simulate car hood) 5 reps with only assist from HHA.  PT cued during activity.     PATIENT EDUCATION: PATIENT EDUCATED ON FOLLOWING PROSTHETIC CARE: Education details:   Proper doffing with proper rotation Person educated: Patient and Spouse Education method: Explanation, Demonstration, Tactile cues, and Verbal cues Education comprehension: verbalized understanding and needs further education  HOME EXERCISE PROGRAM: Access Code: [email or web address] URL: [email or web address] Date: 09/30/2023 Prepared by: Grayce Spatz  Exercises - Standing Hip Abduction with Theraband Resistance  - 1 x daily - 7 x weekly - 1 sets - 10 reps - Standing Hip Extension with Resistance  - 1 x daily - 7 x weekly - 1 sets - 10 reps - Standing Hip Adduction with Resistance (Mirrored)  - 1 x daily - 7 x weekly - 1 sets - 10 reps - Standing Hip Flexion with Resistance (Mirrored)  - 1 x daily - 7 x weekly - 1 sets - 10 reps - Standing posture with back to counter  - 2 x daily - 7 x weekly - 1 sets - 1 reps - 5-10 minutes hold - Upright  Stance at Door Frame Single Arm  - 3-6 x daily - 7 x weekly - 1 sets - 2 reps - 2 deep breathes hold - Upright Stance at Door Frame with Both Arms  - 3-6 x daily - 7 x weekly - 1 sets - 2 reps - 2 deep breathes hold - Modified Thomas Stretch  - 2 x daily - 7 x weekly - 1 sets - 2-3 reps - 30 seconds hold   Try to do these exercises 3-5 times per day.   In front of chair with walker in front for safety: Stand up & sit down 5-10 reps without touching walker unless needed for balance. 5-10 reps Ball up piece of paper.  Lower yourself like sitting down to put paper on floor or pick it up.  Raise back upright similar to standing up.  Pick up 5# object using above technique. 5-10 reps  Near counter top or rail on deck / porch.  Walk forward with left hand near counter looking forward, not staring at floor. 5-10 reps Walk backwards with cane and left hand near counter. 5-10 reps Walk sideways with cane and left hand near counter.  Push off your outside leg.  5-10 reps Use above side stepping to work on turning 90*  push off outside leg and turn other leg to face forward to walk down counter.  When turning to your left you will need a chair back across from counter to touch.    Place 4 step in front of sink or rail and chair back across from it Step up on 4 step with right leg and place prosthetic toes over the edge of step. As soon as prosthetic knee starts to bend step down with Right leg.  Turn around towards counter.  5-10 times    At counter:  Place a chair at end of counter so left hand will be on counter when you stand up. Work on standing up and walking with less hesitancy upon arising. Turn around towards counter at each end.  On return trip your table should now be  on your left side to touch as needed.  Work on this 5-10 times.  Side step with cane & left hand on counter.  Going to right, push off left side when stepping.  Going to left, push off right side.  Move chair so back is  across from sink:  using cane turn from facing sink going to your left until you are facing the chair back, turn around to face sink going to your right Place 2 bands on floor between chair & sink with enough room between the 2 bands for left foot can fit.   (You can put 2 pieces of tape on floor instead of bands if you want). Turn so your left side is to sink & Hold cane in right hand place prosthetic foot between the 2 lines: step RIGHT foot so toe steps on band or tape behind the prosthetic foot then step so heel touches band or tape in front of left foot.   Turn the chair around to face the sink:  face the chair - bend forward gently placing hands in chair bottom then come back upright.  Do 5-10 times.   Stand facing the sink & open up the lower cabinet doors:  hold cane in right hand and left hand on sink (as you improve try to hoover left hand over sink). Try to touch right toes to lower shelf of cabinet with control 5-10 reps.    ASSESSMENT:  CLINICAL IMPRESSION: PT instructed in RLE theraband kicks / stepping to facilitate weight bearing thru prosthesis and balance / posture.  He appears to understand.    OBJECTIVE IMPAIRMENTS: Abnormal gait, decreased activity tolerance, decreased balance, decreased endurance, decreased knowledge of condition, decreased knowledge of use of DME, decreased mobility, difficulty walking, decreased ROM, decreased strength, and prosthetic dependency .   ACTIVITY LIMITATIONS: carrying, lifting, standing, stairs, transfers, and locomotion level  PARTICIPATION LIMITATIONS: meal prep, community activity, church, and household mobility  PERSONAL FACTORS: Age, Fitness, Past/current experiences, Time since onset of injury/illness/exacerbation, and 3+ comorbidities: see PMH are also affecting patient's functional outcome.   REHAB POTENTIAL: Good  CLINICAL DECISION MAKING: Evolving/moderate complexity  EVALUATION COMPLEXITY: Moderate   GOALS: Goals reviewed with  patient? Yes  SHORT TERM GOALS: Target date: 06/27/2023  Patient reports able to don prosthesis with proper rotation modified independent. Baseline: SEE OBJECTIVE DATA Goal status: MET 06/25/2023 2.  Patient able to sit to/from stand engaging hydraulics of prosthetic knee from 18 chair without armrests using BUEs.  Baseline: SEE OBJECTIVE DATA Goal status: MET 06/25/2023  3.  Patient able to pick up object from floor without UE support using prosthetic hydraulics with supervision. Baseline: SEE OBJECTIVE DATA Goal status: MET  06/25/2023  4. Patient ambulates 100' with cane & prosthesis with minA. Baseline: SEE OBJECTIVE DATA Goal status: MET  06/25/2023   LONG TERM GOALS: Target date: 11/25/2023  Patient demonstrates & verbalized understanding of prosthetic care to enable safe utilization of prosthesis. Baseline: SEE OBJECTIVE DATA Goal status: Ongoing     09/30/2023  Patient-Specific Activity Scoring >/= 4/10 Baseline: SEE OBJECTIVE DATA Goal status: Ongoing     09/30/2023  Berg Balance >/= 40/56 (increase by >8 points) to indicate lower fall risk Baseline: SEE OBJECTIVE DATA Goal status:  Ongoing    09/30/2023  Patient ambulates 100' with prosthesis and cane independently Baseline: SEE OBJECTIVE DATA Goal status: Ongoing   09/30/2023  Patient negotiates ramps & curbs with LRAD & prosthesis modified independent.  Baseline: SEE OBJECTIVE DATA Goal  status: Ongoing   09/30/2023  Patient negotiates stairs with single rail & prosthesis modified independent.  Baseline: SEE OBJECTIVE DATA Goal status: Ongoing  09/30/2023   PLAN:  PT FREQUENCY: 1x/week  PT DURATION: 90 days  PLANNED INTERVENTIONS: 97164- PT Re-evaluation, 97110-Therapeutic exercises, 97530- Therapeutic activity, 97112- Neuromuscular re-education, [phone]- Self Care, [id number]- Gait training, [phone]- Prosthetic training, Patient/Family education, Balance training, and Stair training  PLAN FOR NEXT SESSION:  check  standing theraband kicks / stepping,   continue to work on gait with cane including some community type activities.   Grayce Spatz, PT, DPT 09/30/2023, 12:57 PM     Date of referral: 05/17/2023 Referring provider: Burgess Shirlyn Blanch, MD Referring diagnosis? Acquired absence of left leg above knee V49.76 & Z89.612 Treatment diagnosis? (if different than referring diagnosis)  Other abnormalities of gait and mobility ICD-10-CM:  R26.89  Unsteadiness on feet ICD-10-CM:  R26.81  Muscle weakness (generalized) ICD-10-CM:  M62.81  Stiffness of left hip, not elsewhere classified M25.652  Abnormal posture R29.3  What was this (referring dx) caused by? Surgery (Type: AKA)  Nature of Condition: Initial Onset (within last 3 months)  reason for referral delivery of new prosthesis with significant change in design & function within last 3 months   Laterality: Lt  Current Functional Measure Score: Gait Velocity & assist with cane & prosthesis - minA comfortable / self-selected 0.67 ft/sec & fast pace 0.69 ft/sec;  Objective measurements identify impairments when they are compared to normal values, the uninvolved extremity, and prior level of function.  [x]  Yes  []  No  Objective assessment of functional ability: Moderate functional limitations   Briefly describe symptoms: patient is dependent in gait & balance with new Microprocessor Knee prosthesis  How did symptoms start: amputation due to vascular issues  Average pain intensity:  Last 24 hours: 0  Past week: 0  How often does the pt experience symptoms? Frequently  How much have the symptoms interfered with usual daily activities? Moderately  How has condition changed since care began at this facility? NA - initial visit  In general, how is the patients overall health? Good   BACK PAIN (STarT Back Screening Tool) No

## 2023-10-07 ENCOUNTER — Ambulatory Visit: Admitting: Physical Therapy

## 2023-10-07 ENCOUNTER — Encounter: Payer: Self-pay | Admitting: Physical Therapy

## 2023-10-07 DIAGNOSIS — M6281 Muscle weakness (generalized): Secondary | ICD-10-CM

## 2023-10-07 DIAGNOSIS — M25652 Stiffness of left hip, not elsewhere classified: Secondary | ICD-10-CM

## 2023-10-07 DIAGNOSIS — R2681 Unsteadiness on feet: Secondary | ICD-10-CM

## 2023-10-07 DIAGNOSIS — R2689 Other abnormalities of gait and mobility: Secondary | ICD-10-CM

## 2023-10-07 DIAGNOSIS — R293 Abnormal posture: Secondary | ICD-10-CM | POA: Diagnosis not present

## 2023-10-07 NOTE — Therapy (Signed)
 OUTPATIENT PHYSICAL THERAPY PROSTHETIC TREATMENT   Patient Name: Samuel Carney MRN: [id number] DOB:[birth year], 78 y.o., male Today's Date: 10/07/2023  END OF SESSION:  PT End of Session - 10/07/23 0931     Visit Number 17    Number of Visits 25    Date for PT Re-Evaluation 11/25/23    Authorization Type [employer] Medicare dual complete    Authorization Time Period NO AUTH NEEDED, NO COPAY, 10% COINSURANCE    Progress Note Due on Visit 23    PT Start Time 0931    PT Stop Time 1013    PT Time Calculation (min) 42 min    Equipment Utilized During Treatment Gait belt    Activity Tolerance Patient tolerated treatment well    Behavior During Therapy WFL for tasks assessed/performed              Past Medical History:  Diagnosis Date   Alcohol abuse    Aortic atherosclerosis (HCC)    B12 deficiency    Cirrhosis (HCC)    COPD (chronic obstructive pulmonary disease) (HCC)    Coronary artery disease    GERD (gastroesophageal reflux disease)    Glaucoma    Grade II diastolic dysfunction    History of kidney stones    HLD (hyperlipidemia)    Hx of radiation therapy    Hypertension    Lung mass    Moderate mitral regurgitation    Pneumonia    PVD (peripheral vascular disease) (HCC)    Squamous cell carcinoma of lung, right (HCC) 2019   Past Surgical History:  Procedure Laterality Date   COLONOSCOPY WITH PROPOFOL      COLONOSCOPY WITH PROPOFOL  N/A 11/25/2018   Procedure: COLONOSCOPY WITH PROPOFOL ;  Surgeon: Gaylyn Gladis PENNER, MD;  Location: [facility];  Service: Endoscopy;  Laterality: N/A;   COLONOSCOPY WITH PROPOFOL  N/A 11/29/2021   Procedure: COLONOSCOPY WITH PROPOFOL ;  Surgeon: Toledo, Ladell POUR, MD;  Location: [facility];  Service: Gastroenterology;  Laterality: N/A;   ENDARTERECTOMY FEMORAL Left 07/13/2020   Procedure: ENDARTERECTOMY FEMORAL ( SFA STENT);  Surgeon: Jama Cordella MATSU, MD;  Location: [facility];  Service: Vascular;  Laterality: Left;    ESOPHAGOGASTRODUODENOSCOPY (EGD) WITH PROPOFOL  N/A 11/25/2018   Procedure: ESOPHAGOGASTRODUODENOSCOPY (EGD) WITH PROPOFOL ;  Surgeon: Gaylyn Gladis PENNER, MD;  Location: [facility] ENDOSCOPY;  Service: Endoscopy;  Laterality: N/A;   ESOPHAGOGASTRODUODENOSCOPY (EGD) WITH PROPOFOL  N/A 11/29/2021   Procedure: ESOPHAGOGASTRODUODENOSCOPY (EGD) WITH PROPOFOL ;  Surgeon: Toledo, Ladell POUR, MD;  Location: [facility];  Service: Gastroenterology;  Laterality: N/A;   ESOPHAGOGASTRODUODENOSCOPY (EGD) WITH PROPOFOL  N/A 12/08/2021   Procedure: ESOPHAGOGASTRODUODENOSCOPY (EGD) WITH PROPOFOL ;  Surgeon: Therisa Bi, MD;  Location: [facility] ENDOSCOPY;  Service: Gastroenterology;  Laterality: N/A;   LEG AMPUTATION ABOVE KNEE Left    LOWER EXTREMITY ANGIOGRAPHY Left 06/07/2020   Procedure: LOWER EXTREMITY ANGIOGRAPHY;  Surgeon: Jama Cordella MATSU, MD;  Location: [facility];  Service: Cardiovascular;  Laterality: Left;   LOWER EXTREMITY ANGIOGRAPHY Left 01/10/2021   Procedure: LOWER EXTREMITY ANGIOGRAPHY;  Surgeon: Jama Cordella MATSU, MD;  Location: [facility];  Service: Cardiovascular;  Laterality: Left;   Patient Active Problem List   Diagnosis Date Noted   UGIB (upper gastrointestinal bleed)    Malnutrition of moderate degree 12/06/2021   Melena 12/05/2021   COPD (chronic obstructive pulmonary disease) (HCC)    Grade II diastolic dysfunction    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), left (HCC)    AKI (acute kidney injury) (HCC)  Atherosclerosis of artery of extremity with ulceration (HCC) 07/13/2020   Atherosclerosis of native arteries of the extremities with ulceration (HCC) 05/19/2020   Alcoholic cirrhosis of 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) 04/09/2019   Hyperlipidemia, mixed 04/09/2019   Malignant 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) 04/09/2019   Thrombocytopenia (HCC) 04/09/2019   CAD (coronary artery disease) 10/14/2017   Lung cancer (HCC) 10/14/2017   Tobacco abuse 08/14/2017    Benign essential hypertension 09/09/2013    PCP: Glover Lenis, MD  REFERRING PROVIDER: Tobie Burgess Opoka, MD  ONSET DATE: 05/14/2023 received new Microprocessor Knee  REFERRING DIAG: Acquired absence of left leg above knee V49.76 & Z89.612  THERAPY DIAG:  Other abnormalities of gait and mobility  Unsteadiness on feet  Muscle weakness (generalized)  Abnormal posture  Stiffness of left hip, not elsewhere classified  Rationale for Evaluation and Treatment: Rehabilitation  SUBJECTIVE:   SUBJECTIVE STATEMENT: He has been working on band exercises.   No falls.  He has been walking with cane in the house.   Pt accompanied by: significant other  PERTINENT HISTORY: PAD, CAD, angina, malignant neoplasm of right lung, HLD, alcoholic cirrhosis of liver, HTN, COPD   PAIN:  Are you having pain? No  PRECAUTIONS: None  WEIGHT BEARING RESTRICTIONS: No  FALLS: Has patient fallen in last 6 months? No  LIVING ENVIRONMENT: Lives with: lives with their spouse Lives in: House Home Access: Stairs to enter and Ramped entrance Home layout: One level and single step into washer / dryer Stairs: Yes: Internal: 1 steps; none and External: 2 steps; on left going up Has following equipment at home: Single point cane, Walker - 2 wheeled, Crutches, Wheelchair (manual), shower chair, and Ramped entry  OCCUPATION: retired  PLOF: walks independently with prosthesis and RW in community. He uses cane in home.   PATIENT GOALS:  to learn to use new prosthesis to walk in home and community with cane.   OBJECTIVE:  Patient-Specific Activity Scoring Scheme  0 represents "unable to perform." 10 represents "able to perform at prior level. 0 1 2 3 4 5 6 7 8 9  10 (Date and Score)   Activity Eval  08/06/23  08/27/23  1. Walking in house with cane  1  6 6   2. standing balance with ADLs  2  6 10   3. stairs 2 6 6   4.     5.     Score 1.67 6 7.33   Total score = sum of the activity scores/number  of activities Minimum detectable change (90%CI) for average score = 2 points Minimum detectable change (90%CI) for single activity score = 3 points  COGNITION: Evaluation on 05/30/2023: Overall cognitive status: Within functional limits for tasks assessed  CARDIOVASCULAR RESPONSE: Evaluation on  05/30/2023: Functional activity: gait Pre-activity vitals: HR: 65 SpO2: 97% Post-activity vitals: HR: 86 SpO2: 96% Modified Borg scale for dyspnea: 2: mild shortness of breath  POSTURE: Evaluation on 05/30/2023: weight shift right  LOWER EXTREMITY ROM:  ROM P:passive  A:active Right eval Left eval  Hip flexion    Hip extension  Standing -10*  Hip abduction    Hip adduction    Hip internal rotation    Hip external rotation    Knee flexion    Knee extension    Ankle dorsiflexion    Ankle plantarflexion    Ankle inversion    Ankle eversion     (Blank rows = not tested)  LOWER EXTREMITY MMT:  MMT Left 08/27/23  Hip  flexion   Hip extension 4/5  Hip abduction 4/5  Hip adduction   Hip internal rotation   Hip external rotation   Knee flexion   Knee extension   Ankle dorsiflexion   Ankle plantarflexion   Ankle inversion   Ankle eversion   At Evaluation all strength testing is grossly seated and functionally standing / gait. (Blank rows = not tested)  TRANSFERS: 06/25/2023:  Patient able to sit to/from stand engaging hydraulics of prosthetic knee from 18 chair without armrests using BUEs  Evaluation on 05/30/2023: Sit to stand: SBA (cues only) using BUEs on seat of 18 chair and stabilizes without UE support. Minor engagement of MPK prosthesis.  Stand to sit: SBA (cues only) using BUEs on seat of 18 chair and stabilizes without UE support. Minor engagement of MPK prosthesis.   FUNCTIONAL TESTs:  08/27/2023: Lars Balance 35/56 Timed Up & Go: with RW 25.71 sec & with cane PT stopped TUG as pt almost fell.    His prosthetic knee & foot are too externally rotated resulting  in prosthetic knee instability.   08/06/2023: Timed Up & Go: with RW 27.71 sec & with cane 36.81 sec Berg Balance Test:  34/56  06/25/2023:  Patient able to pick up object from floor without UE support using prosthetic hydraulics with supervision.  Evaluation on 05/30/2023:  Berg Balance Scale: 32/56  GAIT: 06/25/2023:  Patient ambulates 100' with cane & prosthesis with minA.  Evaluation on 05/30/2023: Gait pattern: step through pattern, decreased stance time- Left, decreased hip/knee flexion- Left, antalgic, and trunk flexed Distance walked: 150' Assistive device utilized: with TFA MPK prosthesis Single point cane (minA), Walker - 2 wheeled (modified independent / cues for deviations) Level of assistance: Modified independence, SBA, and Min A Gait velocity: with RW comfortable / self-selected 1.03 ft/sec & fast pace 1.96 ft/sec;  with cane & minA comfortable / self-selected 0.67 ft/sec & fast pace 0.69 ft/sec;  CURRENT PROSTHETIC WEAR ASSESSMENT: 08/06/2023: Patient is independent with: skin check, residual limb care, care of non-amputated limb, prosthetic cleaning, ply sock cleaning, and correct ply sock adjustment Patient is dependent with: proper wear schedule/adjustment Donning prosthesis: Modified independence Doffing prosthesis: Modified independence Prosthetic wear tolerance: >90% of awake hours/day, 7 days/week  06/25/2023: patient able to don suction ring socket with proper rotation.   Evaluation on 05/30/2023: Patient is independent with: skin check, residual limb care, care of non-amputated limb, prosthetic cleaning, ply sock cleaning, and correct ply sock adjustment Patient is dependent with: proper wear schedule/adjustment Donning prosthesis: SBA / verbal cues Doffing prosthesis: Modified independence Prosthetic wear tolerance: 10-12 hours/day, 7 days/week Prosthetic weight bearing tolerance: 10 minutes Residual limb condition: pt denies any issues.  Prosthetic description:  silicon liner with suction ring suspension, Microprocessor Knee (MPK), dynamic response foot, Total Contact Socket with flexible inner socket K code/activity level with prosthetic use: Level 3    TODAY'S  TREATMENT:  DATE:  10/07/2023: Prosthetic Care with Transfemoral Microprocessor Knee (MPK) prosthesis: Pt amb 80' X 2 with cane with CGA;  PT demo & verbal cues on step through pattern with heel passing contralateral toes.  Pt verbalized understanding.  Pt's socket was rotating on limb.  PT checked and he was using too much water / alcohol solution for donning which is causing lubrication of suction ring.  PT demo & verbal cues that the solution or hand sanitizer to don or seat limb into socket, but it should dry to maintain suction.  Pt verbalized understanding. PT demo & verbal cues on turning 90* around obstacles with  ASIS (headlights) into turn esp outer LE in turn.  He ambulated 58' with cane around square (4 turns) SBA with left turns & MinA right turns.      TREATMENT:                                                                                                                             DATE:  09/30/2023: Prosthetic Care with Transfemoral Microprocessor Knee (MPK) prosthesis: Pt amb 80' X 2 with cane with CGA   Pt neg curb with cane with minA.  He reports practicing using cane & car hood which is smoother than today in PT.    Therapeutic Exercises - Standing Hip Abduction with green Theraband Resistance  - 1 sets - 10 reps - Standing Hip Extension with green Theraband Resistance - 1 sets - 10 reps - Standing Hip Adduction with green Theraband Resistance (Mirrored)  1 sets - 10 reps - Standing Hip Flexion with green Theraband Resistance (Mirrored)  1 sets - 10 reps - Standing posture with back to counter 1 reps - 3 minutes hold - Upright Stance at Door Frame  Single Arm  2 reps - 2 deep breathes hold - Upright Stance at Door Frame with Both Arms  2 reps - 2 deep breathes hold - Modified Thomas Stretch   2 reps - 30 seconds hold PT updated and reviewed HEP with HO, demo & verbal cues.  Focus on upright posture.  Pt & wife verbalized understanding following performing in clinic.    TREATMENT:                                                                                                                             DATE:  09/24/2023: Prosthetic Care with Transfemoral Microprocessor Knee (MPK) prosthesis:  His socket and lower prosthetic portion are able to be rotated.  PT called Amy W regarding issue. She set appt for 11:00 today. Pt amb 80' X 2 with cane stand alone tip with CGA until prosthesis began to rotate so HHA. PT ended session until he can see prosthetist due to safety.  PT advised to use RW only to exit PT and enter her office until prosthesis is repaired.  He verbalized understanding.     PATIENT EDUCATION: PATIENT EDUCATED ON FOLLOWING PROSTHETIC CARE: Education details:   Proper doffing with proper rotation Person educated: Patient and Spouse Education method: Explanation, Demonstration, Tactile cues, and Verbal cues Education comprehension: verbalized understanding and needs further education  HOME EXERCISE PROGRAM: Access Code: [email or web address] URL: [email or web address] Date: 09/30/2023 Prepared by: Grayce Spatz  Exercises - Standing Hip Abduction with Theraband Resistance  - 1 x daily - 7 x weekly - 1 sets - 10 reps - Standing Hip Extension with Resistance  - 1 x daily - 7 x weekly - 1 sets - 10 reps - Standing Hip Adduction with Resistance (Mirrored)  - 1 x daily - 7 x weekly - 1 sets - 10 reps - Standing Hip Flexion with Resistance (Mirrored)  - 1 x daily - 7 x weekly - 1 sets - 10 reps - Standing posture with back to counter  - 2 x daily - 7 x weekly - 1 sets - 1 reps - 5-10 minutes hold - Upright Stance at Door  Frame Single Arm  - 3-6 x daily - 7 x weekly - 1 sets - 2 reps - 2 deep breathes hold - Upright Stance at Door Frame with Both Arms  - 3-6 x daily - 7 x weekly - 1 sets - 2 reps - 2 deep breathes hold - Modified Thomas Stretch  - 2 x daily - 7 x weekly - 1 sets - 2-3 reps - 30 seconds hold   Try to do these exercises 3-5 times per day.   In front of chair with walker in front for safety: Stand up & sit down 5-10 reps without touching walker unless needed for balance. 5-10 reps Ball up piece of paper.  Lower yourself like sitting down to put paper on floor or pick it up.  Raise back upright similar to standing up.  Pick up 5# object using above technique. 5-10 reps  Near counter top or rail on deck / porch.  Walk forward with left hand near counter looking forward, not staring at floor. 5-10 reps Walk backwards with cane and left hand near counter. 5-10 reps Walk sideways with cane and left hand near counter.  Push off your outside leg.  5-10 reps Use above side stepping to work on turning 90*  push off outside leg and turn other leg to face forward to walk down counter.  When turning to your left you will need a chair back across from counter to touch.    Place 4 step in front of sink or rail and chair back across from it Step up on 4 step with right leg and place prosthetic toes over the edge of step. As soon as prosthetic knee starts to bend step down with Right leg.  Turn around towards counter.  5-10 times    At counter:  Place a chair at end of counter so left hand will be on counter when you stand up. Work on standing up and walking with less hesitancy upon arising. Turn around towards counter at  each end.  On return trip your table should now be on your left side to touch as needed.  Work on this 5-10 times.  Side step with cane & left hand on counter.  Going to right, push off left side when stepping.  Going to left, push off right side.  Move chair so back is across from sink:   using cane turn from facing sink going to your left until you are facing the chair back, turn around to face sink going to your right Place 2 bands on floor between chair & sink with enough room between the 2 bands for left foot can fit.   (You can put 2 pieces of tape on floor instead of bands if you want). Turn so your left side is to sink & Hold cane in right hand place prosthetic foot between the 2 lines: step RIGHT foot so toe steps on band or tape behind the prosthetic foot then step so heel touches band or tape in front of left foot.   Turn the chair around to face the sink:  face the chair - bend forward gently placing hands in chair bottom then come back upright.  Do 5-10 times.   Stand facing the sink & open up the lower cabinet doors:  hold cane in right hand and left hand on sink (as you improve try to hoover left hand over sink). Try to touch right toes to lower shelf of cabinet with control 5-10 reps.    ASSESSMENT:  CLINICAL IMPRESSION: PT reviewed proper donning as rotation seems to be from too much lubricate for donning.  He appears to understand.  PT worked on step through pattern & turning 90* which he improved with PT cues & repetition.    OBJECTIVE IMPAIRMENTS: Abnormal gait, decreased activity tolerance, decreased balance, decreased endurance, decreased knowledge of condition, decreased knowledge of use of DME, decreased mobility, difficulty walking, decreased ROM, decreased strength, and prosthetic dependency .   ACTIVITY LIMITATIONS: carrying, lifting, standing, stairs, transfers, and locomotion level  PARTICIPATION LIMITATIONS: meal prep, community activity, church, and household mobility  PERSONAL FACTORS: Age, Fitness, Past/current experiences, Time since onset of injury/illness/exacerbation, and 3+ comorbidities: see PMH are also affecting patient's functional outcome.   REHAB POTENTIAL: Good  CLINICAL DECISION MAKING: Evolving/moderate complexity  EVALUATION  COMPLEXITY: Moderate   GOALS: Goals reviewed with patient? Yes  SHORT TERM GOALS: Target date: 06/27/2023  Patient reports able to don prosthesis with proper rotation modified independent. Baseline: SEE OBJECTIVE DATA Goal status: MET 06/25/2023 2.  Patient able to sit to/from stand engaging hydraulics of prosthetic knee from 18 chair without armrests using BUEs.  Baseline: SEE OBJECTIVE DATA Goal status: MET 06/25/2023  3.  Patient able to pick up object from floor without UE support using prosthetic hydraulics with supervision. Baseline: SEE OBJECTIVE DATA Goal status: MET  06/25/2023  4. Patient ambulates 100' with cane & prosthesis with minA. Baseline: SEE OBJECTIVE DATA Goal status: MET  06/25/2023   LONG TERM GOALS: Target date: 11/25/2023  Patient demonstrates & verbalized understanding of prosthetic care to enable safe utilization of prosthesis. Baseline: SEE OBJECTIVE DATA Goal status: Ongoing       10/07/2023  Patient-Specific Activity Scoring >/= 4/10 Baseline: SEE OBJECTIVE DATA Goal status: Ongoing     10/07/2023  Berg Balance >/= 40/56 (increase by >8 points) to indicate lower fall risk Baseline: SEE OBJECTIVE DATA Goal status:  Ongoing     10/07/2023  Patient ambulates 100' with prosthesis and cane  independently Baseline: SEE OBJECTIVE DATA Goal status: Ongoing     10/07/2023  Patient negotiates ramps & curbs with LRAD & prosthesis modified independent.  Baseline: SEE OBJECTIVE DATA Goal status: Ongoing     10/07/2023  Patient negotiates stairs with single rail & prosthesis modified independent.  Baseline: SEE OBJECTIVE DATA Goal status: Ongoing  10/07/2023   PLAN:  PT FREQUENCY: 1x/week  PT DURATION: 90 days  PLANNED INTERVENTIONS: 97164- PT Re-evaluation, 97110-Therapeutic exercises, 97530- Therapeutic activity, 97112- Neuromuscular re-education, [phone]- Self Care, [id number]- Gait training, [phone]- Prosthetic training, Patient/Family education, Balance training, and  Stair training  PLAN FOR NEXT SESSION:  continue to work on gait with cane including some community type activities.   Grayce Spatz, PT, DPT 10/07/2023, 12:29 PM     Date of referral: 05/17/2023 Referring provider: Burgess Shirlyn Blanch, MD Referring diagnosis? Acquired absence of left leg above knee V49.76 & Z89.612 Treatment diagnosis? (if different than referring diagnosis)  Other abnormalities of gait and mobility ICD-10-CM:  R26.89  Unsteadiness on feet ICD-10-CM:  R26.81  Muscle weakness (generalized) ICD-10-CM:  M62.81  Stiffness of left hip, not elsewhere classified M25.652  Abnormal posture R29.3  What was this (referring dx) caused by? Surgery (Type: AKA)  Nature of Condition: Initial Onset (within last 3 months)  reason for referral delivery of new prosthesis with significant change in design & function within last 3 months   Laterality: Lt  Current Functional Measure Score: Gait Velocity & assist with cane & prosthesis - minA comfortable / self-selected 0.67 ft/sec & fast pace 0.69 ft/sec;  Objective measurements identify impairments when they are compared to normal values, the uninvolved extremity, and prior level of function.  [x]  Yes  []  No  Objective assessment of functional ability: Moderate functional limitations   Briefly describe symptoms: patient is dependent in gait & balance with new Microprocessor Knee prosthesis  How did symptoms start: amputation due to vascular issues  Average pain intensity:  Last 24 hours: 0  Past week: 0  How often does the pt experience symptoms? Frequently  How much have the symptoms interfered with usual daily activities? Moderately  How has condition changed since care began at this facility? NA - initial visit  In general, how is the patients overall health? Good   BACK PAIN (STarT Back Screening Tool) No

## 2023-10-14 ENCOUNTER — Ambulatory Visit: Admitting: Physical Therapy

## 2023-10-14 ENCOUNTER — Encounter: Payer: Self-pay | Admitting: Physical Therapy

## 2023-10-14 DIAGNOSIS — R2689 Other abnormalities of gait and mobility: Secondary | ICD-10-CM | POA: Diagnosis not present

## 2023-10-14 DIAGNOSIS — R293 Abnormal posture: Secondary | ICD-10-CM

## 2023-10-14 DIAGNOSIS — R2681 Unsteadiness on feet: Secondary | ICD-10-CM

## 2023-10-14 DIAGNOSIS — M25652 Stiffness of left hip, not elsewhere classified: Secondary | ICD-10-CM

## 2023-10-14 DIAGNOSIS — M6281 Muscle weakness (generalized): Secondary | ICD-10-CM | POA: Diagnosis not present

## 2023-10-14 NOTE — Therapy (Signed)
 OUTPATIENT PHYSICAL THERAPY PROSTHETIC TREATMENT   Patient Name: Samuel Carney MRN: [id number] DOB:April 27, 1945, 78 y.o., male Today's Date: 10/14/2023  END OF SESSION:  PT End of Session - 10/14/23 0931     Visit Number 18    Number of Visits 25    Date for PT Re-Evaluation 11/25/23    Authorization Type [employer] Medicare dual complete    Authorization Time Period NO AUTH NEEDED, NO COPAY, 10% COINSURANCE    Progress Note Due on Visit 23    PT Start Time 0931    PT Stop Time 1012    PT Time Calculation (min) 41 min    Equipment Utilized During Treatment Gait belt    Activity Tolerance Patient tolerated treatment well    Behavior During Therapy WFL for tasks assessed/performed               Past Medical History:  Diagnosis Date   Alcohol abuse    Aortic atherosclerosis (HCC)    B12 deficiency    Cirrhosis (HCC)    COPD (chronic obstructive pulmonary disease) (HCC)    Coronary artery disease    GERD (gastroesophageal reflux disease)    Glaucoma    Grade II diastolic dysfunction    History of kidney stones    HLD (hyperlipidemia)    Hx of radiation therapy    Hypertension    Lung mass    Moderate mitral regurgitation    Pneumonia    PVD (peripheral vascular disease) (HCC)    Squamous cell carcinoma of lung, right (HCC) 2019   Past Surgical History:  Procedure Laterality Date   COLONOSCOPY WITH PROPOFOL      COLONOSCOPY WITH PROPOFOL  N/A 11/25/2018   Procedure: COLONOSCOPY WITH PROPOFOL ;  Surgeon: Gaylyn Gladis PENNER, MD;  Location: [facility] ENDOSCOPY;  Service: Endoscopy;  Laterality: N/A;   COLONOSCOPY WITH PROPOFOL  N/A 11/29/2021   Procedure: COLONOSCOPY WITH PROPOFOL ;  Surgeon: Toledo, Ladell POUR, MD;  Location: [facility] ENDOSCOPY;  Service: Gastroenterology;  Laterality: N/A;   ENDARTERECTOMY FEMORAL Left 07/13/2020   Procedure: ENDARTERECTOMY FEMORAL ( SFA STENT);  Surgeon: Jama Cordella MATSU, MD;  Location: [facility] ORS;  Service: Vascular;  Laterality: Left;    ESOPHAGOGASTRODUODENOSCOPY (EGD) WITH PROPOFOL  N/A 11/25/2018   Procedure: ESOPHAGOGASTRODUODENOSCOPY (EGD) WITH PROPOFOL ;  Surgeon: Gaylyn Gladis PENNER, MD;  Location: [facility] ENDOSCOPY;  Service: Endoscopy;  Laterality: N/A;   ESOPHAGOGASTRODUODENOSCOPY (EGD) WITH PROPOFOL  N/A 11/29/2021   Procedure: ESOPHAGOGASTRODUODENOSCOPY (EGD) WITH PROPOFOL ;  Surgeon: Toledo, Ladell POUR, MD;  Location: [facility] ENDOSCOPY;  Service: Gastroenterology;  Laterality: N/A;   ESOPHAGOGASTRODUODENOSCOPY (EGD) WITH PROPOFOL  N/A 12/08/2021   Procedure: ESOPHAGOGASTRODUODENOSCOPY (EGD) WITH PROPOFOL ;  Surgeon: Therisa Bi, MD;  Location: [facility] ENDOSCOPY;  Service: Gastroenterology;  Laterality: N/A;   LEG AMPUTATION ABOVE KNEE Left    LOWER EXTREMITY ANGIOGRAPHY Left 06/07/2020   Procedure: LOWER EXTREMITY ANGIOGRAPHY;  Surgeon: Jama Cordella MATSU, MD;  Location: [facility] INVASIVE CV LAB;  Service: Cardiovascular;  Laterality: Left;   LOWER EXTREMITY ANGIOGRAPHY Left 01/10/2021   Procedure: LOWER EXTREMITY ANGIOGRAPHY;  Surgeon: Jama Cordella MATSU, MD;  Location: [facility] INVASIVE CV LAB;  Service: Cardiovascular;  Laterality: Left;   Patient Active Problem List   Diagnosis Date Noted   UGIB (upper gastrointestinal bleed)    Malnutrition of moderate degree 12/06/2021   Melena 12/05/2021   COPD (chronic obstructive pulmonary disease) (HCC)    Grade II diastolic dysfunction    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), left (HCC)    AKI (acute kidney injury) (HCC)  Atherosclerosis of artery of extremity with ulceration (HCC) 07/13/2020   Atherosclerosis of native arteries of the extremities with ulceration (HCC) 05/19/2020   Alcoholic cirrhosis of 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) 04/09/2019   Hyperlipidemia, mixed 04/09/2019   Malignant 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) 04/09/2019   Thrombocytopenia (HCC) 04/09/2019   CAD (coronary artery disease) 10/14/2017   Lung cancer (HCC) 10/14/2017   Tobacco abuse 08/14/2017    Benign essential hypertension 09/09/2013    PCP: Glover Lenis, MD  REFERRING PROVIDER: Tobie Burgess Opoka, MD  ONSET DATE: 05/14/2023 received new Microprocessor Knee  REFERRING DIAG: Acquired absence of left leg above knee V49.76 & Z89.612  THERAPY DIAG:  Other abnormalities of gait and mobility  Unsteadiness on feet  Muscle weakness (generalized)  Abnormal posture  Stiffness of left hip, not elsewhere classified  Rationale for Evaluation and Treatment: Rehabilitation  SUBJECTIVE:   SUBJECTIVE STATEMENT: He has been using less solution as advised to don prosthesis, so it not moving as much.   Pt accompanied by: significant other  PERTINENT HISTORY: PAD, CAD, angina, malignant neoplasm of right lung, HLD, alcoholic cirrhosis of liver, HTN, COPD   PAIN:  Are you having pain? No  PRECAUTIONS: None  WEIGHT BEARING RESTRICTIONS: No  FALLS: Has patient fallen in last 6 months? No  LIVING ENVIRONMENT: Lives with: lives with their spouse Lives in: House Home Access: Stairs to enter and Ramped entrance Home layout: One level and single step into washer / dryer Stairs: Yes: Internal: 1 steps; none and External: 2 steps; on left going up Has following equipment at home: Single point cane, Walker - 2 wheeled, Crutches, Wheelchair (manual), shower chair, and Ramped entry  OCCUPATION: retired  PLOF: walks independently with prosthesis and RW in community. He uses cane in home.   PATIENT GOALS:  to learn to use new prosthesis to walk in home and community with cane.   OBJECTIVE:  Patient-Specific Activity Scoring Scheme  0 represents "unable to perform." 10 represents "able to perform at prior level. 0 1 2 3 4 5 6 7 8 9  10 (Date and Score)   Activity Eval  08/06/23  08/27/23  1. Walking in house with cane  1  6 6   2. standing balance with ADLs  2  6 10   3. stairs 2 6 6   4.     5.     Score 1.67 6 7.33   Total score = sum of the activity scores/number of  activities Minimum detectable change (90%CI) for average score = 2 points Minimum detectable change (90%CI) for single activity score = 3 points  COGNITION: Evaluation on 05/30/2023: Overall cognitive status: Within functional limits for tasks assessed  CARDIOVASCULAR RESPONSE: Evaluation on  05/30/2023: Functional activity: gait Pre-activity vitals: HR: 65 SpO2: 97% Post-activity vitals: HR: 86 SpO2: 96% Modified Borg scale for dyspnea: 2: mild shortness of breath  POSTURE: Evaluation on 05/30/2023: weight shift right  LOWER EXTREMITY ROM:  ROM P:passive  A:active Right eval Left eval  Hip flexion    Hip extension  Standing -10*  Hip abduction    Hip adduction    Hip internal rotation    Hip external rotation    Knee flexion    Knee extension    Ankle dorsiflexion    Ankle plantarflexion    Ankle inversion    Ankle eversion     (Blank rows = not tested)  LOWER EXTREMITY MMT:  MMT Left 08/27/23  Hip flexion   Hip  extension 4/5  Hip abduction 4/5  Hip adduction   Hip internal rotation   Hip external rotation   Knee flexion   Knee extension   Ankle dorsiflexion   Ankle plantarflexion   Ankle inversion   Ankle eversion   At Evaluation all strength testing is grossly seated and functionally standing / gait. (Blank rows = not tested)  TRANSFERS: 06/25/2023:  Patient able to sit to/from stand engaging hydraulics of prosthetic knee from 18 chair without armrests using BUEs  Evaluation on 05/30/2023: Sit to stand: SBA (cues only) using BUEs on seat of 18 chair and stabilizes without UE support. Minor engagement of MPK prosthesis.  Stand to sit: SBA (cues only) using BUEs on seat of 18 chair and stabilizes without UE support. Minor engagement of MPK prosthesis.   FUNCTIONAL TESTs:  08/27/2023: Lars Balance 35/56 Timed Up & Go: with RW 25.71 sec & with cane PT stopped TUG as pt almost fell.    His prosthetic knee & foot are too externally rotated resulting in  prosthetic knee instability.   08/06/2023: Timed Up & Go: with RW 27.71 sec & with cane 36.81 sec Berg Balance Test:  34/56  06/25/2023:  Patient able to pick up object from floor without UE support using prosthetic hydraulics with supervision.  Evaluation on 05/30/2023:  Berg Balance Scale: 32/56  GAIT: 06/25/2023:  Patient ambulates 100' with cane & prosthesis with minA.  Evaluation on 05/30/2023: Gait pattern: step through pattern, decreased stance time- Left, decreased hip/knee flexion- Left, antalgic, and trunk flexed Distance walked: 150' Assistive device utilized: with TFA MPK prosthesis Single point cane (minA), Walker - 2 wheeled (modified independent / cues for deviations) Level of assistance: Modified independence, SBA, and Min A Gait velocity: with RW comfortable / self-selected 1.03 ft/sec & fast pace 1.96 ft/sec;  with cane & minA comfortable / self-selected 0.67 ft/sec & fast pace 0.69 ft/sec;  CURRENT PROSTHETIC WEAR ASSESSMENT: 08/06/2023: Patient is independent with: skin check, residual limb care, care of non-amputated limb, prosthetic cleaning, ply sock cleaning, and correct ply sock adjustment Patient is dependent with: proper wear schedule/adjustment Donning prosthesis: Modified independence Doffing prosthesis: Modified independence Prosthetic wear tolerance: >90% of awake hours/day, 7 days/week  06/25/2023: patient able to don suction ring socket with proper rotation.   Evaluation on 05/30/2023: Patient is independent with: skin check, residual limb care, care of non-amputated limb, prosthetic cleaning, ply sock cleaning, and correct ply sock adjustment Patient is dependent with: proper wear schedule/adjustment Donning prosthesis: SBA / verbal cues Doffing prosthesis: Modified independence Prosthetic wear tolerance: 10-12 hours/day, 7 days/week Prosthetic weight bearing tolerance: 10 minutes Residual limb condition: pt denies any issues.  Prosthetic description:  silicon liner with suction ring suspension, Microprocessor Knee (MPK), dynamic response foot, Total Contact Socket with flexible inner socket K code/activity level with prosthetic use: Level 3    TODAY'S  TREATMENT:  DATE:  10/14/2023:  Prosthetic Care with Transfemoral Microprocessor Knee (MPK) prosthesis: Pt amb 80' X 2 and 40' X 2 with cane with CGA except one balance loss first walk with minA to stabilize. Pt has instability moment due to shoe resistance at terminal stance then sudden release for swing.  PT trial of simulated leather toe cap with explanation to pt & wife.  The leather toe cap did decrease that instability moment.   Pt verbalized understanding with plan to continue trial for one week.  PT neg 6.5 curb with cane with modA and needed HHA. Pt neg 4 curb 2 reps with cane with minA & intermittent HHA.  PT demo & verbal cues on using 4 step near sink (on left side) with cane RUE (goal is to not touch counter) pt performed 3 reps with supervision and verbalizes understanding for home.      TREATMENT:                                                                                                                             DATE:  10/07/2023: Prosthetic Care with Transfemoral Microprocessor Knee (MPK) prosthesis: Pt amb 80' X 2 with cane with CGA;  PT demo & verbal cues on step through pattern with heel passing contralateral toes.  Pt verbalized understanding.  Pt's socket was rotating on limb.  PT checked and he was using too much water / alcohol solution for donning which is causing lubrication of suction ring.  PT demo & verbal cues that the solution or hand sanitizer to don or seat limb into socket, but it should dry to maintain suction.  Pt verbalized understanding. PT demo & verbal cues on turning 90* around obstacles with  ASIS (headlights) into turn esp outer  LE in turn.  He ambulated 83' with cane around square (4 turns) SBA with left turns & MinA right turns.      TREATMENT:                                                                                                                             DATE:  09/30/2023: Prosthetic Care with Transfemoral Microprocessor Knee (MPK) prosthesis: Pt amb 80' X 2 with cane with CGA   Pt neg curb with cane with minA.  He reports practicing using cane & car hood which is smoother than today in PT.    Therapeutic Exercises - Standing Hip Abduction  with green Theraband Resistance  - 1 sets - 10 reps - Standing Hip Extension with green Theraband Resistance - 1 sets - 10 reps - Standing Hip Adduction with green Theraband Resistance (Mirrored)  1 sets - 10 reps - Standing Hip Flexion with green Theraband Resistance (Mirrored)  1 sets - 10 reps - Standing posture with back to counter 1 reps - 3 minutes hold - Upright Stance at Door Frame Single Arm  2 reps - 2 deep breathes hold - Upright Stance at Door Frame with Both Arms  2 reps - 2 deep breathes hold - Modified Thomas Stretch   2 reps - 30 seconds hold PT updated and reviewed HEP with HO, demo & verbal cues.  Focus on upright posture.  Pt & wife verbalized understanding following performing in clinic.     PATIENT EDUCATION: PATIENT EDUCATED ON FOLLOWING PROSTHETIC CARE: Education details:   Proper doffing with proper rotation Person educated: Patient and Spouse Education method: Explanation, Demonstration, Tactile cues, and Verbal cues Education comprehension: verbalized understanding and needs further education  HOME EXERCISE PROGRAM: Access Code: [email or web address] URL: [email or web address] Date: 09/30/2023 Prepared by: Grayce Spatz  Exercises - Standing Hip Abduction with Theraband Resistance  - 1 x daily - 7 x weekly - 1 sets - 10 reps - Standing Hip Extension with Resistance  - 1 x daily - 7 x weekly - 1 sets - 10 reps - Standing Hip  Adduction with Resistance (Mirrored)  - 1 x daily - 7 x weekly - 1 sets - 10 reps - Standing Hip Flexion with Resistance (Mirrored)  - 1 x daily - 7 x weekly - 1 sets - 10 reps - Standing posture with back to counter  - 2 x daily - 7 x weekly - 1 sets - 1 reps - 5-10 minutes hold - Upright Stance at Door Frame Single Arm  - 3-6 x daily - 7 x weekly - 1 sets - 2 reps - 2 deep breathes hold - Upright Stance at Door Frame with Both Arms  - 3-6 x daily - 7 x weekly - 1 sets - 2 reps - 2 deep breathes hold - Modified Thomas Stretch  - 2 x daily - 7 x weekly - 1 sets - 2-3 reps - 30 seconds hold   Try to do these exercises 3-5 times per day.   In front of chair with walker in front for safety: Stand up & sit down 5-10 reps without touching walker unless needed for balance. 5-10 reps Ball up piece of paper.  Lower yourself like sitting down to put paper on floor or pick it up.  Raise back upright similar to standing up.  Pick up 5# object using above technique. 5-10 reps  Near counter top or rail on deck / porch.  Walk forward with left hand near counter looking forward, not staring at floor. 5-10 reps Walk backwards with cane and left hand near counter. 5-10 reps Walk sideways with cane and left hand near counter.  Push off your outside leg.  5-10 reps Use above side stepping to work on turning 90*  push off outside leg and turn other leg to face forward to walk down counter.  When turning to your left you will need a chair back across from counter to touch.    Place 4 step in front of sink or rail and chair back across from it Step up on 4 step with right leg and place prosthetic toes over the edge  of step. As soon as prosthetic knee starts to bend step down with Right leg.  Turn around towards counter.  5-10 times   At counter:  Place a chair at end of counter so left hand will be on counter when you stand up. Work on standing up and walking with less hesitancy upon arising. Turn around  towards counter at each end.  On return trip your table should now be on your left side to touch as needed.  Work on this 5-10 times.  Side step with cane & left hand on counter.  Going to right, push off left side when stepping.  Going to left, push off right side.  Move chair so back is across from sink:  using cane turn from facing sink going to your left until you are facing the chair back, turn around to face sink going to your right Place 2 bands on floor between chair & sink with enough room between the 2 bands for left foot can fit.   (You can put 2 pieces of tape on floor instead of bands if you want). Turn so your left side is to sink & Hold cane in right hand place prosthetic foot between the 2 lines: step RIGHT foot so toe steps on band or tape behind the prosthetic foot then step so heel touches band or tape in front of left foot.   Turn the chair around to face the sink:  face the chair - bend forward gently placing hands in chair bottom then come back upright.  Do 5-10 times.   Stand facing the sink & open up the lower cabinet doors:  hold cane in right hand and left hand on sink (as you improve try to hoover left hand over sink). Try to touch right toes to lower shelf of cabinet with control 5-10 reps.    ASSESSMENT:  CLINICAL IMPRESSION: Simulated leather toe cap seemed to help moment of instability with initiating swing.  He is progressing towards LTGs but needs on going skilled PT.   OBJECTIVE IMPAIRMENTS: Abnormal gait, decreased activity tolerance, decreased balance, decreased endurance, decreased knowledge of condition, decreased knowledge of use of DME, decreased mobility, difficulty walking, decreased ROM, decreased strength, and prosthetic dependency .   ACTIVITY LIMITATIONS: carrying, lifting, standing, stairs, transfers, and locomotion level  PARTICIPATION LIMITATIONS: meal prep, community activity, church, and household mobility  PERSONAL FACTORS: Age, Fitness,  Past/current experiences, Time since onset of injury/illness/exacerbation, and 3+ comorbidities: see PMH are also affecting patient's functional outcome.   REHAB POTENTIAL: Good  CLINICAL DECISION MAKING: Evolving/moderate complexity  EVALUATION COMPLEXITY: Moderate   GOALS: Goals reviewed with patient? Yes  SHORT TERM GOALS: Target date: 06/27/2023  Patient reports able to don prosthesis with proper rotation modified independent. Baseline: SEE OBJECTIVE DATA Goal status: MET 06/25/2023 2.  Patient able to sit to/from stand engaging hydraulics of prosthetic knee from 18 chair without armrests using BUEs.  Baseline: SEE OBJECTIVE DATA Goal status: MET 06/25/2023  3.  Patient able to pick up object from floor without UE support using prosthetic hydraulics with supervision. Baseline: SEE OBJECTIVE DATA Goal status: MET  06/25/2023  4. Patient ambulates 100' with cane & prosthesis with minA. Baseline: SEE OBJECTIVE DATA Goal status: MET  06/25/2023   LONG TERM GOALS: Target date: 11/25/2023  Patient demonstrates & verbalized understanding of prosthetic care to enable safe utilization of prosthesis. Baseline: SEE OBJECTIVE DATA Goal status: Ongoing     10/14/2023  Patient-Specific Activity Scoring >/= 4/10 Baseline:  SEE OBJECTIVE DATA Goal status: Ongoing     10/14/2023  Berg Balance >/= 40/56 (increase by >8 points) to indicate lower fall risk Baseline: SEE OBJECTIVE DATA Goal status:  Ongoing    10/14/2023  Patient ambulates 100' with prosthesis and cane independently Baseline: SEE OBJECTIVE DATA Goal status: Ongoing     10/14/2023  Patient negotiates ramps & curbs with LRAD & prosthesis modified independent.  Baseline: SEE OBJECTIVE DATA Goal status: Ongoing     10/14/2023  Patient negotiates stairs with single rail & prosthesis modified independent.  Baseline: SEE OBJECTIVE DATA Goal status: Ongoing  10/14/2023   PLAN:  PT FREQUENCY: 1x/week  PT DURATION: 90  days  PLANNED INTERVENTIONS: 97164- PT Re-evaluation, 97110-Therapeutic exercises, 97530- Therapeutic activity, 97112- Neuromuscular re-education, [phone]- Self Care, [id number]- Gait training, [phone]- Prosthetic training, Patient/Family education, Balance training, and Stair training  PLAN FOR NEXT SESSION:  continue to work on gait with cane including some community type activities.   Grayce Spatz, PT, DPT 10/14/2023, 11:09 AM

## 2023-10-21 ENCOUNTER — Encounter: Payer: Self-pay | Admitting: Physical Therapy

## 2023-10-21 ENCOUNTER — Ambulatory Visit: Admitting: Physical Therapy

## 2023-10-21 DIAGNOSIS — M6281 Muscle weakness (generalized): Secondary | ICD-10-CM | POA: Diagnosis not present

## 2023-10-21 DIAGNOSIS — R2681 Unsteadiness on feet: Secondary | ICD-10-CM | POA: Diagnosis not present

## 2023-10-21 DIAGNOSIS — R2689 Other abnormalities of gait and mobility: Secondary | ICD-10-CM | POA: Diagnosis not present

## 2023-10-21 DIAGNOSIS — R293 Abnormal posture: Secondary | ICD-10-CM | POA: Diagnosis not present

## 2023-10-21 NOTE — Therapy (Addendum)
 OUTPATIENT PHYSICAL THERAPY PROSTHETIC TREATMENT   Patient Name: Samuel Carney MRN: [id number] DOB:1945-11-21, 78 y.o., male Today's Date: 10/21/2023  END OF SESSION:  PT End of Session - 10/21/23 0935     Visit Number 19    Number of Visits 25    Date for PT Re-Evaluation 11/25/23    Authorization Type [employer] Medicare dual complete    Authorization Time Period NO AUTH NEEDED, NO COPAY, 10% COINSURANCE    Progress Note Due on Visit 23    PT Start Time 0935    PT Stop Time 1015    PT Time Calculation (min) 40 min    Equipment Utilized During Treatment Gait belt    Activity Tolerance Patient tolerated treatment well    Behavior During Therapy WFL for tasks assessed/performed             Past Medical History:  Diagnosis Date   Alcohol abuse    Aortic atherosclerosis (HCC)    B12 deficiency    Cirrhosis (HCC)    COPD (chronic obstructive pulmonary disease) (HCC)    Coronary artery disease    GERD (gastroesophageal reflux disease)    Glaucoma    Grade II diastolic dysfunction    History of kidney stones    HLD (hyperlipidemia)    Hx of radiation therapy    Hypertension    Lung mass    Moderate mitral regurgitation    Pneumonia    PVD (peripheral vascular disease) (HCC)    Squamous cell carcinoma of lung, right (HCC) 2019   Past Surgical History:  Procedure Laterality Date   COLONOSCOPY WITH PROPOFOL      COLONOSCOPY WITH PROPOFOL  N/A 11/25/2018   Procedure: COLONOSCOPY WITH PROPOFOL ;  Surgeon: Gaylyn Gladis PENNER, MD;  Location: [facility] ENDOSCOPY;  Service: Endoscopy;  Laterality: N/A;   COLONOSCOPY WITH PROPOFOL  N/A 11/29/2021   Procedure: COLONOSCOPY WITH PROPOFOL ;  Surgeon: Toledo, Ladell POUR, MD;  Location: [facility] ENDOSCOPY;  Service: Gastroenterology;  Laterality: N/A;   ENDARTERECTOMY FEMORAL Left 07/13/2020   Procedure: ENDARTERECTOMY FEMORAL ( SFA STENT);  Surgeon: Jama Cordella MATSU, MD;  Location: [facility] ORS;  Service: Vascular;  Laterality: Left;    ESOPHAGOGASTRODUODENOSCOPY (EGD) WITH PROPOFOL  N/A 11/25/2018   Procedure: ESOPHAGOGASTRODUODENOSCOPY (EGD) WITH PROPOFOL ;  Surgeon: Gaylyn Gladis PENNER, MD;  Location: [facility]COPY;  Service: Endoscopy;  Laterality: N/A;   ESOPHAGOGASTRODUODENOSCOPY (EGD) WITH PROPOFOL  N/A 11/29/2021   Procedure: ESOPHAGOGASTRODUODENOSCOPY (EGD) WITH PROPOFOL ;  Surgeon: Toledo, Ladell POUR, MD;  Location: [facility] ENDOSCOPY;  Service: Gastroenterology;  Laterality: N/A;   ESOPHAGOGASTRODUODENOSCOPY (EGD) WITH PROPOFOL  N/A 12/08/2021   Procedure: ESOPHAGOGASTRODUODENOSCOPY (EGD) WITH PROPOFOL ;  Surgeon: Therisa Bi, MD;  Location: [facility] ENDOSCOPY;  Service: Gastroenterology;  Laterality: N/A;   LEG AMPUTATION ABOVE KNEE Left    LOWER EXTREMITY ANGIOGRAPHY Left 06/07/2020   Procedure: LOWER EXTREMITY ANGIOGRAPHY;  Surgeon: Jama Cordella MATSU, MD;  Location: [facility] INVASIVE CV LAB;  Service: Cardiovascular;  Laterality: Left;   LOWER EXTREMITY ANGIOGRAPHY Left 01/10/2021   Procedure: LOWER EXTREMITY ANGIOGRAPHY;  Surgeon: Jama Cordella MATSU, MD;  Location: [facility] INVAS[facility] CV LAB;  Service: Cardiovascular;  Laterality: Left;   Patient Active Problem List   Diagnosis Date Noted   UGIB (upper gastrointestinal bleed)    Malnutrition of moderate degree 12/06/2021   Melena 12/05/2021   COPD (chronic obstructive pulmonary disease) (HCC)    Grade II diastolic dysfunction    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), left (HCC)    AKI (acute kidney injury) (HCC)  Atherosclerosis of artery of extremity with ulceration (HCC) 07/13/2020   Atherosclerosis of native arteries of the extremities with ulceration (HCC) 05/19/2020   Alcoholic cirrhosis of 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) 04/09/2019   Hyperlipidemia, mixed 04/09/2019   Malignant 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) 04/09/2019   Thrombocytopenia (HCC) 04/09/2019   CAD (coronary artery disease) 10/14/2017   Lung cancer (HCC) 10/14/2017   Tobacco abuse 08/14/2017    Benign essential hypertension 09/09/2013    PCP: Glover Lenis, MD  REFERRING PROVIDER: Tobie Burgess [place]oka, MD  ONSET DATE: 05/14/2023 received new Microprocessor Knee  REFERRING DIAG: Acquired absence of left leg above knee V49.76 & Z89.612  THERAPY DIAG:  Other abnormalities of gait and mobility  Unsteadiness on feet  Muscle weakness (generalized)  Abnormal posture  Rationale for Evaluation and Treatment: Rehabilitation  SUBJECTIVE:   SUBJECTIVE STATEMENT: Patient reports doing well overall. He reports being consistent with his exercises at home. Recently had eye surgery which does not allow him to bend over for the time being.   Pt accompanied by: significant other  PERTINENT HISTORY: PAD, CAD, angina, malignant neoplasm of right lung, HLD, alcoholic cirrhosis of liver, HTN, COPD   PAIN:  Are you having pain? No  PRECAUTIONS: None  WEIGHT BEARING RESTRICTIONS: No  FALLS: Has patient fallen in last 6 months? No  LIVING ENVIRONMENT: Lives with: lives with their spouse Lives in: House Home Access: Stairs to enter and Ramped entrance Home layout: One level and single step into washer / dryer Stairs: Yes: Internal: 1 steps; none and External: 2 steps; on left going up Has following equipment at home: Single point cane, Walker - 2 wheeled, Crutches, Wheelchair (manual), shower chair, and Ramped entry  OCCUPATION: retired  PLOF: walks independently with prosthesis and RW in community. He uses cane in home.   PATIENT GOALS:  to learn to use new prosthesis to walk in home and community with cane.   OBJECTIVE:  Patient-Specific Activity Scoring Scheme  0 represents "unable to perform." 10 represents "able to perform at prior level. 0 1 2 3 4 5 6 7 8 9  10 (Date and Score)   Activity Eval  08/06/23  08/27/23  1. Walking in house with cane  1  6 6   2. standing balance with ADLs  2  6 10   3. stairs 2 6 6   4.     5.     Score 1.67 6 7.33   Total score = sum  of the activity scores/number of activities Minimum detectable change (90%CI) for average score = 2 points Minimum detectable change (90%CI) for single activity score = 3 points  COGNITION: Evaluation on 05/30/2023: Overall cognitive status: Within functional limits for tasks assessed  CARDIOVASCULAR RESPONSE: Evaluation on  05/30/2023: Functional activity: gait Pre-activity vitals: HR: 65 SpO2: 97% Post-activity vitals: HR: 86 SpO2: 96% Modified Borg scale for dyspnea: 2: mild shortness of breath  POSTURE: Evaluation on 05/30/2023: weight shift right  LOWER EXTREMITY ROM:  ROM P:passive  A:active Right eval Left eval  Hip flexion    Hip extension  Standing -10*  Hip abduction    Hip adduction    Hip internal rotation    Hip external rotation    Knee flexion    Knee extension    Ankle dorsiflexion    Ankle plantarflexion    Ankle inversion    Ankle eversion     (Blank rows = not tested)  LOWER EXTREMITY MMT:  MMT Left 08/27/23  Hip flexion   Hip extension 4/5  Hip abduction 4/5  Hip adduction   Hip internal rotation   Hip external rotation   Knee flexion   Knee extension   Ankle dorsiflexion   Ankle plantarflexion   Ankle inversion   Ankle eversion   At Evaluation all strength testing is grossly seated and functionally standing / gait. (Blank rows = not tested)  TRANSFERS: 06/25/2023:  Patient able to sit to/from stand engaging hydraulics of prosthetic knee from 18 chair without armrests using BUEs  Evaluation on 05/30/2023: Sit to stand: SBA (cues only) using BUEs on seat of 18 chair and stabilizes without UE support. Minor engagement of MPK prosthesis.  Stand to sit: SBA (cues only) using BUEs on seat of 18 chair and stabilizes without UE support. Minor engagement of MPK prosthesis.   FUNCTIONAL TESTs:  08/27/2023: Lars Balance 35/56 Timed Up & Go: with RW 25.71 sec & with cane PT stopped TUG as pt almost fell.    His prosthetic knee & foot are  too externally rotated resulting in prosthetic knee instability.   08/06/2023: Timed Up & Go: with RW 27.71 sec & with cane 36.81 sec Berg Balance Test:  34/56  06/25/2023:  Patient able to pick up object from floor without UE support using prosthetic hydraulics with supervision.  Evaluation on 05/30/2023:  Berg Balance Scale: 32/56  GAIT: 06/25/2023:  Patient ambulates 100' with cane & prosthesis with minA.  Evaluation on 05/30/2023: Gait pattern: step through pattern, decreased stance time- Left, decreased hip/knee flexion- Left, antalgic, and trunk flexed Distance walked: 150' Assistive device utilized: with TFA MPK prosthesis Single point cane (minA), Walker - 2 wheeled (modified independent / cues for deviations) Level of assistance: Modified independence, SBA, and Min A Gait velocity: with RW comfortable / self-selected 1.03 ft/sec & fast pace 1.96 ft/sec;  with cane & minA comfortable / self-selected 0.67 ft/sec & fast pace 0.69 ft/sec;  CURRENT PROSTHETIC WEAR ASSESSMENT: 08/06/2023: Patient is independent with: skin check, residual limb care, care of non-amputated limb, prosthetic cleaning, ply sock cleaning, and correct ply sock adjustment Patient is dependent with: proper wear schedule/adjustment Donning prosthesis: Modified independence Doffing prosthesis: Modified independence Prosthetic wear tolerance: >90% of awake hours/day, 7 days/week  06/25/2023: patient able to don suction ring socket with proper rotation.   Evaluation on 05/30/2023: Patient is independent with: skin check, residual limb care, care of non-amputated limb, prosthetic cleaning, ply sock cleaning, and correct ply sock adjustment Patient is dependent with: proper wear schedule/adjustment Donning prosthesis: SBA / verbal cues Doffing prosthesis: Modified independence Prosthetic wear tolerance: 10-12 hours/day, 7 days/week Prosthetic weight bearing tolerance: 10 minutes Residual limb condition: pt denies any  issues.  Prosthetic description: silicon liner with suction ring suspension, Microprocessor Knee (MPK), dynamic response foot, Total Contact Socket with flexible inner socket K code/activity level with prosthetic use: Level 3    TODAY'S   TREATMENT:  DATE:  10/21/2023:  Prosthetic Care with Transfemoral Microprocessor Knee (MPK) prosthesis: Patient amb 46' with cane around 4 obstacles to chair with no armrest with CGA, and no losses of balance. 3x Patient negotiated 6.5 inch curb with cane with minA and needed HHA, 4x.  Initially had a difficult time with cane, prosthetic pattern.  Patient had most trouble when he hesitated. Patient negotiated 12* ramp with cane with modA and needed HHA, 3x.  Patient had most trouble when he hesitated. Patient ambulated 120 feet with CGA and cane.   TREATMENT:                                                                                                                             DATE:  10/14/2023:  Prosthetic Care with Transfemoral Microprocessor Knee (MPK) prosthesis: Pt amb 80' X 2 and 40' X 2 with cane with CGA except one balance loss first walk with minA to stabilize. Pt has instability moment due to shoe resistance at terminal stance then sudden release for swing.  PT trial of simulated leather toe cap with explanation to pt & wife.  The leather toe cap did decrease that instability moment.   Pt verbalized understanding with plan to continue trial for one week.  PT neg 6.5 curb with cane with modA and needed HHA. Pt neg 4 curb 2 reps with cane with minA & intermittent HHA.  PT demo & verbal cues on using 4 step near sink (on left side) with cane RUE (goal is to not touch counter) pt performed 3 reps with supervision and verbalizes understanding for home.      TREATMENT:                                                                                                                              DATE:  10/07/2023: Prosthetic Care with Transfemoral Microprocessor Knee (MPK) prosthesis: Pt amb 80' X 2 with cane with CGA;  PT demo & verbal cues on step through pattern with heel passing contralateral toes.  Pt verbalized understanding.  Pt's socket was rotating on limb.  PT checked and he was using too much water / alcohol solution for donning which is causing lubrication of suction ring.  PT demo & verbal cues that the solution or hand sanitizer to don or seat limb into socket, but it should dry to maintain suction.  Pt verbalized understanding. PT demo & verbal  cues on turning 90* around obstacles with  ASIS (headlights) into turn esp outer LE in turn.  He ambulated 26' with cane around square (4 turns) SBA with left turns & MinA right turns.      TREATMENT:                                                                                                                             DATE:  09/30/2023: Prosthetic Care with Transfemoral Microprocessor Knee (MPK) prosthesis: Pt amb 80' X 2 with cane with CGA   Pt neg curb with cane with minA.  He reports practicing using cane & car hood which is smoother than today in PT.    Therapeutic Exercises - Standing Hip Abduction with green Theraband Resistance  - 1 sets - 10 reps - Standing Hip Extension with green Theraband Resistance - 1 sets - 10 reps - Standing Hip Adduction with green Theraband Resistance (Mirrored)  1 sets - 10 reps - Standing Hip Flexion with green Theraband Resistance (Mirrored)  1 sets - 10 reps - Standing posture with back to counter 1 reps - 3 minutes hold - Upright Stance at Door Frame Single Arm  2 reps - 2 deep breathes hold - Upright Stance at Door Frame with Both Arms  2 reps - 2 deep breathes hold - Modified Thomas Stretch   2 reps - 30 seconds hold PT updated and reviewed HEP with HO, demo & verbal cues.  Focus on upright posture.  Pt & wife verbalized  understanding following performing in clinic.     PATIENT EDUCATION: PATIENT EDUCATED ON FOLLOWING PROSTHETIC CARE: Education details:   Proper doffing with proper rotation Person educated: Patient and Spouse Education method: Explanation, Demonstration, Tactile cues, and Verbal cues Education comprehension: verbalized understanding and needs further education  HOME EXERCISE PROGRAM: Access Code: [email or web address] URL: [email or web address] Date: 09/30/2023 Prepared by: Grayce Spatz  Exercises - Standing Hip Abduction with Theraband Resistance  - 1 x daily - 7 x weekly - 1 sets - 10 reps - Standing Hip Extension with Resistance  - 1 x daily - 7 x weekly - 1 sets - 10 reps - Standing Hip Adduction with Resistance (Mirrored)  - 1 x daily - 7 x weekly - 1 sets - 10 reps - Standing Hip Flexion with Resistance (Mirrored)  - 1 x daily - 7 x weekly - 1 sets - 10 reps - Standing posture with back to counter  - 2 x daily - 7 x weekly - 1 sets - 1 reps - 5-10 minutes hold - Upright Stance at Door Frame Single Arm  - 3-6 x daily - 7 x weekly - 1 sets - 2 reps - 2 deep breathes hold - Upright Stance at Door Frame with Both Arms  - 3-6 x daily - 7 x weekly - 1 sets - 2 reps - 2 deep breathes hold - Modified Thomas Stretch  - 2 x daily - 7  x weekly - 1 sets - 2-3 reps - 30 seconds hold   Try to do these exercises 3-5 times per day.   In front of chair with walker in front for safety: Stand up & sit down 5-10 reps without touching walker unless needed for balance. 5-10 reps Ball up piece of paper.  Lower yourself like sitting down to put paper on floor or pick it up.  Raise back upright similar to standing up.  Pick up 5# object using above technique. 5-10 reps  Near counter top or rail on deck / porch.  Walk forward with left hand near counter looking forward, not staring at floor. 5-10 reps Walk backwards with cane and left hand near counter. 5-10 reps Walk sideways with cane and left  hand near counter.  Push off your outside leg.  5-10 reps Use above side stepping to work on turning 90*  push off outside leg and turn other leg to face forward to walk down counter.  When turning to your left you will need a chair back across from counter to touch.    Place 4 step in front of sink or rail and chair back across from it Step up on 4 step with right leg and place prosthetic toes over the edge of step. As soon as prosthetic knee starts to bend step down with Right leg.  Turn around towards counter.  5-10 times   At counter:  Place a chair at end of counter so left hand will be on counter when you stand up. Work on standing up and walking with less hesitancy upon arising. Turn around towards counter at each end.  On return trip your table should now be on your left side to touch as needed.  Work on this 5-10 times.  Side step with cane & left hand on counter.  Going to right, push off left side when stepping.  Going to left, push off right side.  Move chair so back is across from sink:  using cane turn from facing sink going to your left until you are facing the chair back, turn around to face sink going to your right Place 2 bands on floor between chair & sink with enough room between the 2 bands for left foot can fit.   (You can put 2 pieces of tape on floor instead of bands if you want). Turn so your left side is to sink & Hold cane in right hand place prosthetic foot between the 2 lines: step RIGHT foot so toe steps on band or tape behind the prosthetic foot then step so heel touches band or tape in front of left foot.   Turn the chair around to face the sink:  face the chair - bend forward gently placing hands in chair bottom then come back upright.  Do 5-10 times.   Stand facing the sink & open up the lower cabinet doors:  hold cane in right hand and left hand on sink (as you improve try to hoover left hand over sink). Try to touch right toes to lower shelf of cabinet with control  5-10 reps.    ASSESSMENT:  CLINICAL IMPRESSION: Patient overall did well with today's session.  Initially, the patient was nervous and unsure of the curb and a ramp but with a few repetitions, the patient felt sure of himself.  His confidence allowed him not to hesitate which resulted in better performance.  Patient has trouble bringing his weight over his legs when  walking up the ramp.  He is progressing towards his goals but continues to benefit from skilled physical therapy.   OBJECTIVE IMPAIRMENTS: Abnormal gait, decreased activity tolerance, decreased balance, decreased endurance, decreased knowledge of condition, decreased knowledge of use of DME, decreased mobility, difficulty walking, decreased ROM, decreased strength, and prosthetic dependency .   ACTIVITY LIMITATIONS: carrying, lifting, standing, stairs, transfers, and locomotion level  PARTICIPATION LIMITATIONS: meal prep, community activity, church, and household mobility  PERSONAL FACTORS: Age, Fitness, Past/current experiences, Time since onset of injury/illness/exacerbation, and 3+ comorbidities: see PMH are also affecting patient's functional outcome.   REHAB POTENTIAL: Good  CLINICAL DECISION MAKING: Evolving/moderate complexity  EVALUATION COMPLEXITY: Moderate   GOALS: Goals reviewed with patient? Yes  SHORT TERM GOALS: Target date: 06/27/2023  Patient reports able to don prosthesis with proper rotation modified independent. Baseline: SEE OBJECTIVE DATA Goal status: MET 06/25/2023 2.  Patient able to sit to/from stand engaging hydraulics of prosthetic knee from 18 chair without armrests using BUEs.  Baseline: SEE OBJECTIVE DATA Goal status: MET 06/25/2023  3.  Patient able to pick up object from floor without UE support using prosthetic hydraulics with supervision. Baseline: SEE OBJECTIVE DATA Goal status: MET  06/25/2023  4. Patient ambulates 100' with cane & prosthesis with minA. Baseline: SEE OBJECTIVE  DATA Goal status: MET  06/25/2023   LONG TERM GOALS: Target date: 11/25/2023  Patient demonstrates & verbalized understanding of prosthetic care to enable safe utilization of prosthesis. Baseline: SEE OBJECTIVE DATA Goal status: Ongoing     10/14/2023  Patient-Specific Activity Scoring >/= 4/10 Baseline: SEE OBJECTIVE DATA Goal status: Ongoing     10/14/2023  Berg Balance >/= 40/56 (increase by >8 points) to indicate lower fall risk Baseline: SEE OBJECTIVE DATA Goal status:  Ongoing    10/14/2023  Patient ambulates 100' with prosthesis and cane independently Baseline: SEE OBJECTIVE DATA Goal status: Ongoing     10/14/2023  Patient negotiates ramps & curbs with LRAD & prosthesis modified independent.  Baseline: SEE OBJECTIVE DATA Goal status: Ongoing     10/14/2023  Patient negotiates stairs with single rail & prosthesis modified independent.  Baseline: SEE OBJECTIVE DATA Goal status: Ongoing  10/14/2023   PLAN:  PT FREQUENCY: 1x/week  PT DURATION: 90 days  PLANNED INTERVENTIONS: 97164- PT Re-evaluation, 97110-Therapeutic exercises, 97530- Therapeutic activity, 97112- Neuromuscular re-education, [phone]- Self Care, [id number]- Gait training, [phone]- Prosthetic training, Patient/Family education, Balance training, and Stair training  PLAN FOR NEXT SESSION:  Continue to work on negotiating ramps and curbs so that the pattern feels more natural to the patient.  Set up HEP near sink to work on components of the BERG such as turning 360, single-leg stance, narrow base of support, standing unsupported.  Ismael Nap, Student-PT, DPT 10/21/2023, 1:22 PM   This entire session of physical therapy was performed under the direct supervision of PT signing evaluation /treatment. PT reviewed note and agrees.   Grayce Spatz, PT, DPT 10/21/2023, 2:46 PM

## 2023-10-28 ENCOUNTER — Encounter: Payer: Self-pay | Admitting: Physical Therapy

## 2023-10-28 ENCOUNTER — Ambulatory Visit (INDEPENDENT_AMBULATORY_CARE_PROVIDER_SITE_OTHER): Admitting: Physical Therapy

## 2023-10-28 DIAGNOSIS — M6281 Muscle weakness (generalized): Secondary | ICD-10-CM

## 2023-10-28 DIAGNOSIS — R2689 Other abnormalities of gait and mobility: Secondary | ICD-10-CM

## 2023-10-28 DIAGNOSIS — R293 Abnormal posture: Secondary | ICD-10-CM

## 2023-10-28 DIAGNOSIS — R2681 Unsteadiness on feet: Secondary | ICD-10-CM | POA: Diagnosis not present

## 2023-10-28 DIAGNOSIS — M25652 Stiffness of left hip, not elsewhere classified: Secondary | ICD-10-CM

## 2023-10-28 NOTE — Patient Instructions (Addendum)
 Exercises to be done at sink with chair behind  With patient facing forward and chair behind positioned so that he can sit if he has a loss of balance.  Place feet together and stand there and balance without use of hands.  Balance with eyes closed as well for 10 seconds With patient facing forward and chair behind positioned so that he can sit if he has a loss of balance. Reach forward with him trying to touch the wall in front.  Make sure to lean forward instead of rotating to get the arm closer.  Try exercise with each hand With patient facing forward and chair behind positioned so that he can sit if he has a loss of balance. Look behind you form both directions. Try without use of hands or cane.  With patient facing forward and chair behind positioned so that he can sit if he has a loss of balance. Use bottom self cabinet as a step to practice step taps.  Use both hands to stabilize on sink while facing the sink.  Progressed to only using 1 hand to stabilize.

## 2023-10-28 NOTE — Therapy (Addendum)
 OUTPATIENT PHYSICAL THERAPY PROSTHETIC TREATMENT   Patient Name: Samuel Carney MRN: [id number] DOB:12/20/1945, 78 y.o., male Today's Date: 10/28/2023  END OF SESSION:  PT End of Session - 10/28/23 0932     Visit Number 20    Number of Visits 25    Date for PT Re-Evaluation 11/25/23    Authorization Type [employer] Medicare dual complete    Authorization Time Period NO AUTH NEEDED, NO COPAY, 10% COINSURANCE    Progress Note Due on Visit 23    PT Start Time 0930    PT Stop Time 1025    PT Time Calculation (min) 55 min    Equipment Utilized During Treatment Gait belt    Activity Tolerance Patient tolerated treatment well    Behavior During Therapy WFL for tasks assessed/performed              Past Medical History:  Diagnosis Date   Alcohol abuse    Aortic atherosclerosis (HCC)    B12 deficiency    Cirrhosis (HCC)    COPD (chronic obstructive pulmonary disease) (HCC)    Coronary artery disease    GERD (gastroesophageal reflux disease)    Glaucoma    Grade II diastolic dysfunction    History of kidney stones    HLD (hyperlipidemia)    Hx of radiation therapy    Hypertension    Lung mass    Moderate mitral regurgitation    Pneumonia    PVD (peripheral vascular disease) (HCC)    Squamous cell carcinoma of lung, right (HCC) 2019   Past Surgical History:  Procedure Laterality Date   COLONOSCOPY WITH PROPOFOL      COLONOSCOPY WITH PROPOFOL  N/A 11/25/2018   Procedure: COLONOSCOPY WITH PROPOFOL ;  Surgeon: Gaylyn Gladis PENNER, MD;  Location: [facility] ENDOSCOPY;  Service: Endoscopy;  Laterality: N/A;   COLONOSCOPY WITH PROPOFOL  N/A 11/29/2021   Procedure: COLONOSCOPY WITH PROPOFOL ;  Surgeon: Toledo, Ladell POUR, MD;  Location: [facility] ENDOSCOPY;  Service: Gastroenterology;  Laterality: N/A;   ENDARTERECTOMY FEMORAL Left 07/13/2020   Procedure: ENDARTERECTOMY FEMORAL ( SFA STENT);  Surgeon: Jama Cordella MATSU, MD;  Location: [facility] ORS;  Service: Vascular;  Laterality: Left;    ESOPHAGOGASTRODUODENOSCOPY (EGD) WITH PROPOFOL  N/A 11/25/2018   Procedure: ESOPHAGOGASTRODUODENOSCOPY (EGD) WITH PROPOFOL ;  Surgeon: Gaylyn Gladis PENNER, MD;  Location: [facility] ENDOSCOPY;  Service: Endoscopy;  Laterality: N/A;   ESOPHAGOGASTRODUODENOSCOPY (EGD) WITH PROPOFOL  N/A 11/29/2021   Procedure: ESOPHAGOGASTRODUODENOSCOPY (EGD) WITH PROPOFOL ;  Surgeon: Toledo, Ladell POUR, MD;  Location: [facility] ENDOSCOPY;  Service: Gastroenterology;  Laterality: N/A;   ESOPHAGOGASTRODUODENOSCOPY (EGD) WITH PROPOFOL  N/A 12/08/2021   Procedure: ESOPHAGOGASTRODUODENOSCOPY (EGD) WITH PROPOFOL ;  Surgeon: Therisa Bi, MD;  Location: [facility] ENDOSCOPY;  Service: Gastroenterology;  Laterality: N/A;   LEG AMPUTATION ABOVE KNEE Left    LOWER EXTREMITY ANGIOGRAPHY Left 06/07/2020   Procedure: LOWER EXTREMITY ANGIOGRAPHY;  Surgeon: Jama Cordella MATSU, MD;  Location: [facility] INVASIVE [facility];  Service: Cardiovascular;  Laterality: Left;   LOWER EXTREMITY ANGIOGRAPHY Left 01/10/2021   Procedure: LOWER EXTREMITY ANGIOGRAPHY;  Surgeon: Jama Cordella MATSU, MD;  Location: [facility];  Service: Cardiovascular;  Laterality: Left;   Patient Active Problem List   Diagnosis Date Noted   UGIB (upper gastrointestinal bleed)    Malnutrition of moderate degree 12/06/2021   Melena 12/05/2021   COPD (chronic obstructive pulmonary disease) (HCC)    Grade II diastolic dysfunction    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), left (HCC)    AKI (acute kidney injury) (HCC)  Atherosclerosis of artery of extremity with ulceration (HCC) 07/13/2020   Atherosclerosis of native arteries of the extremities with ulceration (HCC) 05/19/2020   Alcoholic cirrhosis of 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) 04/09/2019   Hyperlipidemia, mixed 04/09/2019   Malignant 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) 04/09/2019   Thrombocytopenia (HCC) 04/09/2019   CAD (coronary artery disease) 10/14/2017   Lung cancer (HCC) 10/14/2017   Tobacco abuse 08/14/2017    Benign essential hypertension 09/09/2013    PCP: Glover Lenis, MD  REFERRING PROVIDER: Tobie Burgess Opoka, MD  ONSET DATE: 05/14/2023 received new Microprocessor Knee  REFERRING DIAG: Acquired absence of left leg above knee V49.76 & Z89.612  THERAPY DIAG:  Other abnormalities of gait and mobility  Unsteadiness on feet  Muscle weakness (generalized)  Abnormal posture  Stiffness of left hip, not elsewhere classified  Rationale for Evaluation and Treatment: Rehabilitation  SUBJECTIVE:   SUBJECTIVE STATEMENT:  Patient reports doing well overall and no falls.  He was unable to navigate grassy terrain to go to sister's graveside portion of funeral and was unable to walk long distances.  Pt accompanied by: significant other  PERTINENT HISTORY: PAD, CAD, angina, malignant neoplasm of right lung, HLD, alcoholic cirrhosis of liver, HTN, COPD   PAIN:  Are you having pain? No  PRECAUTIONS: None  WEIGHT BEARING RESTRICTIONS: No  FALLS: Has patient fallen in last 6 months? No  LIVING ENVIRONMENT: Lives with: lives with their spouse Lives in: House Home Access: Stairs to enter and Ramped entrance Home layout: One level and single step into washer / dryer Stairs: Yes: Internal: 1 steps; none and External: 2 steps; on left going up Has following equipment at home: Single point cane, Walker - 2 wheeled, Crutches, Wheelchair (manual), shower chair, and Ramped entry  OCCUPATION: retired  PLOF: walks independently with prosthesis and RW in community. He uses cane in home.   PATIENT GOALS:  to learn to use new prosthesis to walk in home and community with cane.   OBJECTIVE:  Patient-Specific Activity Scoring Scheme  0 represents "unable to perform." 10 represents "able to perform at prior level. 0 1 2 3 4 5 6 7 8 9  10 (Date and Score)   Activity Eval  08/06/23  08/27/23  1. Walking in house with cane  1  6 6   2. standing balance with ADLs  2  6 10   3. stairs 2 6 6   4.      5.     Score 1.67 6 7.33   Total score = sum of the activity scores/number of activities Minimum detectable change (90%CI) for average score = 2 points Minimum detectable change (90%CI) for single activity score = 3 points  COGNITION: Evaluation on 05/30/2023: Overall cognitive status: Within functional limits for tasks assessed  CARDIOVASCULAR RESPONSE: Evaluation on  05/30/2023: Functional activity: gait Pre-activity vitals: HR: 65 SpO2: 97% Post-activity vitals: HR: 86 SpO2: 96% Modified Borg scale for dyspnea: 2: mild shortness of breath  POSTURE: Evaluation on 05/30/2023: weight shift right  LOWER EXTREMITY ROM:  ROM P:passive  A:active Right eval Left eval  Hip flexion    Hip extension  Standing -10*  Hip abduction    Hip adduction    Hip internal rotation    Hip external rotation    Knee flexion    Knee extension    Ankle dorsiflexion    Ankle plantarflexion    Ankle inversion    Ankle eversion     (Blank rows = not tested)  LOWER EXTREMITY MMT:  MMT Left 08/27/23  Hip flexion   Hip extension 4/5  Hip abduction 4/5  Hip adduction   Hip internal rotation   Hip external rotation   Knee flexion   Knee extension   Ankle dorsiflexion   Ankle plantarflexion   Ankle inversion   Ankle eversion   At Evaluation all strength testing is grossly seated and functionally standing / gait. (Blank rows = not tested)  TRANSFERS: 06/25/2023:  Patient able to sit to/from stand engaging hydraulics of prosthetic knee from 18 chair without armrests using BUEs  Evaluation on 05/30/2023: Sit to stand: SBA (cues only) using BUEs on seat of 18 chair and stabilizes without UE support. Minor engagement of MPK prosthesis.  Stand to sit: SBA (cues only) using BUEs on seat of 18 chair and stabilizes without UE support. Minor engagement of MPK prosthesis.   FUNCTIONAL TESTs:  08/27/2023: Lars Balance 35/56 Timed Up & Go: with RW 25.71 sec & with cane PT stopped TUG as pt  almost fell.    His prosthetic knee & foot are too externally rotated resulting in prosthetic knee instability.   08/06/2023: Timed Up & Go: with RW 27.71 sec & with cane 36.81 sec Berg Balance Test:  34/56  06/25/2023:  Patient able to pick up object from floor without UE support using prosthetic hydraulics with supervision.  Evaluation on 05/30/2023:  Berg Balance Scale: 32/56  GAIT: 06/25/2023:  Patient ambulates 100' with cane & prosthesis with minA.  Evaluation on 05/30/2023: Gait pattern: step through pattern, decreased stance time- Left, decreased hip/knee flexion- Left, antalgic, and trunk flexed Distance walked: 150' Assistive device utilized: with TFA MPK prosthesis Single point cane (minA), Walker - 2 wheeled (modified independent / cues for deviations) Level of assistance: Modified independence, SBA, and Min A Gait velocity: with RW comfortable / self-selected 1.03 ft/sec & fast pace 1.96 ft/sec;  with cane & minA comfortable / self-selected 0.67 ft/sec & fast pace 0.69 ft/sec;  CURRENT PROSTHETIC WEAR ASSESSMENT: 08/06/2023: Patient is independent with: skin check, residual limb care, care of non-amputated limb, prosthetic cleaning, ply sock cleaning, and correct ply sock adjustment Patient is dependent with: proper wear schedule/adjustment Donning prosthesis: Modified independence Doffing prosthesis: Modified independence Prosthetic wear tolerance: >90% of awake hours/day, 7 days/week  06/25/2023: patient able to don suction ring socket with proper rotation.   Evaluation on 05/30/2023: Patient is independent with: skin check, residual limb care, care of non-amputated limb, prosthetic cleaning, ply sock cleaning, and correct ply sock adjustment Patient is dependent with: proper wear schedule/adjustment Donning prosthesis: SBA / verbal cues Doffing prosthesis: Modified independence Prosthetic wear tolerance: 10-12 hours/day, 7 days/week Prosthetic weight bearing tolerance: 10  minutes Residual limb condition: pt denies any issues.  Prosthetic description: silicon liner with suction ring suspension, Microprocessor Knee (MPK), dynamic response foot, Total Contact Socket with flexible inner socket K code/activity level with prosthetic use: Level 3    TODAY'S   TREATMENT:  DATE:  10/28/2023:  Prosthetic Care with Transfemoral Microprocessor Knee (MPK) prosthesis: Patient amb 65' with cane with CGA and no losses of balance PT verbally instructed in changing shoes to dress shoes can increase speed of heel contact to foot flat which can flex prosthetic knee more rapidly.   Neuro Re-ed The following exercises were done between a sink and a chair for support during balance exercises Stand with feet together and no UE or cane support,  3x30 seconds  Stand with feet together and no UE or cane support, 2x10 seconds Reach forward with feet shoulder width apart, 3x5 reps each hand. Cues needed to avoid trunk rotation and to lean forward instead Look over shoulder with no UE or cane support, 10x each side Step taps: with B UE support initially and progressed to single UE support, 3x8 180* turns w/ minA, CW and CCW. 5x Cues needed for Lt hip hike to avoid LOB Patient given written instructions for all these exercises except 180* turns to do it at home as HEP   TREATMENT:                                                                                                                             DATE:  10/21/2023:  Prosthetic Care with Transfemoral Microprocessor Knee (MPK) prosthesis: Patient amb 13' with cane around 4 obstacles to chair with no armrest with CGA, and no losses of balance. 3x Patient negotiated 6.5 inch curb with cane with minA and needed HHA, 4x.  Initially had a difficult time with cane, prosthetic pattern.  Patient had most trouble when he  hesitated. Patient negotiated 12* ramp with cane with modA and needed HHA, 3x.  Patient had most trouble when he hesitated. Patient ambulated 120 feet with CGA and cane.   TREATMENT:                                                                                                                             DATE:  10/14/2023:  Prosthetic Care with Transfemoral Microprocessor Knee (MPK) prosthesis: Pt amb 80' X 2 and 40' X 2 with cane with CGA except one balance loss first walk with minA to stabilize. Pt has instability moment due to shoe resistance at terminal stance then sudden release for swing.  PT trial of simulated leather toe cap with explanation to pt & wife.  The leather toe cap did decrease that instability moment.  Pt verbalized understanding with plan to continue trial for one week.  PT neg 6.5 curb with cane with modA and needed HHA. Pt neg 4 curb 2 reps with cane with minA & intermittent HHA.  PT demo & verbal cues on using 4 step near sink (on left side) with cane RUE (goal is to not touch counter) pt performed 3 reps with supervision and verbalizes understanding for home.      TREATMENT:                                                                                                                             DATE:  10/07/2023: Prosthetic Care with Transfemoral Microprocessor Knee (MPK) prosthesis: Pt amb 80' X 2 with cane with CGA;  PT demo & verbal cues on step through pattern with heel passing contralateral toes.  Pt verbalized understanding.  Pt's socket was rotating on limb.  PT checked and he was using too much water / alcohol solution for donning which is causing lubrication of suction ring.  PT demo & verbal cues that the solution or hand sanitizer to don or seat limb into socket, but it should dry to maintain suction.  Pt verbalized understanding. PT demo & verbal cues on turning 90* around obstacles with  ASIS (headlights) into turn esp outer LE in turn.  He ambulated 34'  with cane around square (4 turns) SBA with left turns & MinA right turns.     PATIENT EDUCATION: PATIENT EDUCATED ON FOLLOWING PROSTHETIC CARE: Education details:   Proper doffing with proper rotation Person educated: Patient and Spouse Education method: Explanation, Demonstration, Tactile cues, and Verbal cues Education comprehension: verbalized understanding and needs further education  HOME EXERCISE PROGRAM: Access Code: [email or web address] URL: [email or web address] Date: 09/30/2023 Prepared by: Grayce Spatz  Exercises - Standing Hip Abduction with Theraband Resistance  - 1 x daily - 7 x weekly - 1 sets - 10 reps - Standing Hip Extension with Resistance  - 1 x daily - 7 x weekly - 1 sets - 10 reps - Standing Hip Adduction with Resistance (Mirrored)  - 1 x daily - 7 x weekly - 1 sets - 10 reps - Standing Hip Flexion with Resistance (Mirrored)  - 1 x daily - 7 x weekly - 1 sets - 10 reps - Standing posture with back to counter  - 2 x daily - 7 x weekly - 1 sets - 1 reps - 5-10 minutes hold - Upright Stance at Door Frame Single Arm  - 3-6 x daily - 7 x weekly - 1 sets - 2 reps - 2 deep breathes hold - Upright Stance at Door Frame with Both Arms  - 3-6 x daily - 7 x weekly - 1 sets - 2 reps - 2 deep breathes hold - Modified Thomas Stretch  - 2 x daily - 7 x weekly - 1 sets - 2-3 reps - 30 seconds hold   Try to do these exercises 3-5  times per day.   In front of chair with walker in front for safety: Stand up & sit down 5-10 reps without touching walker unless needed for balance. 5-10 reps Ball up piece of paper.  Lower yourself like sitting down to put paper on floor or pick it up.  Raise back upright similar to standing up.  Pick up 5# object using above technique. 5-10 reps  Near counter top or rail on deck / porch.  Walk forward with left hand near counter looking forward, not staring at floor. 5-10 reps Walk backwards with cane and left hand near counter. 5-10 reps Walk  sideways with cane and left hand near counter.  Push off your outside leg.  5-10 reps Use above side stepping to work on turning 90*  push off outside leg and turn other leg to face forward to walk down counter.  When turning to your left you will need a chair back across from counter to touch.    Place 4 step in front of sink or rail and chair back across from it Step up on 4 step with right leg and place prosthetic toes over the edge of step. As soon as prosthetic knee starts to bend step down with Right leg.  Turn around towards counter.  5-10 times   At counter:  Place a chair at end of counter so left hand will be on counter when you stand up. Work on standing up and walking with less hesitancy upon arising. Turn around towards counter at each end.  On return trip your table should now be on your left side to touch as needed.  Work on this 5-10 times.  Side step with cane & left hand on counter.  Going to right, push off left side when stepping.  Going to left, push off right side.  Move chair so back is across from sink:  using cane turn from facing sink going to your left until you are facing the chair back, turn around to face sink going to your right Place 2 bands on floor between chair & sink with enough room between the 2 bands for left foot can fit.   (You can put 2 pieces of tape on floor instead of bands if you want). Turn so your left side is to sink & Hold cane in right hand place prosthetic foot between the 2 lines: step RIGHT foot so toe steps on band or tape behind the prosthetic foot then step so heel touches band or tape in front of left foot.   Turn the chair around to face the sink:  face the chair - bend forward gently placing hands in chair bottom then come back upright.  Do 5-10 times.   Stand facing the sink & open up the lower cabinet doors:  hold cane in right hand and left hand on sink (as you improve try to hoover left hand over sink). Try to touch right toes to lower  shelf of cabinet with control 5-10 reps.    ASSESSMENT:  CLINICAL IMPRESSION:  Patient overall did well with today's session. He did well with balance exercises introduced today shown by minimal sway and no LOB. He had most difficulty with 180* turns and was advised not to try those at home yet. Patient will continue to benefit from skilled PT to address impairments.   OBJECTIVE IMPAIRMENTS: Abnormal gait, decreased activity tolerance, decreased balance, decreased endurance, decreased knowledge of condition, decreased knowledge of use of DME, decreased mobility,  difficulty walking, decreased ROM, decreased strength, and prosthetic dependency .   ACTIVITY LIMITATIONS: carrying, lifting, standing, stairs, transfers, and locomotion level  PARTICIPATION LIMITATIONS: meal prep, community activity, church, and household mobility  PERSONAL FACTORS: Age, Fitness, Past/current experiences, Time since onset of injury/illness/exacerbation, and 3+ comorbidities: see PMH are also affecting patient's functional outcome.   REHAB POTENTIAL: Good  CLINICAL DECISION MAKING: Evolving/moderate complexity  EVALUATION COMPLEXITY: Moderate   GOALS: Goals reviewed with patient? Yes  SHORT TERM GOALS: Target date: 06/27/2023  Patient reports able to don prosthesis with proper rotation modified independent. Baseline: SEE OBJECTIVE DATA Goal status: MET 06/25/2023 2.  Patient able to sit to/from stand engaging hydraulics of prosthetic knee from 18 chair without armrests using BUEs.  Baseline: SEE OBJECTIVE DATA Goal status: MET 06/25/2023  3.  Patient able to pick up object from floor without UE support using prosthetic hydraulics with supervision. Baseline: SEE OBJECTIVE DATA Goal status: MET  06/25/2023  4. Patient ambulates 100' with cane & prosthesis with minA. Baseline: SEE OBJECTIVE DATA Goal status: MET  06/25/2023   LONG TERM GOALS: Target date: 11/25/2023  Patient demonstrates & verbalized  understanding of prosthetic care to enable safe utilization of prosthesis. Baseline: SEE OBJECTIVE DATA Goal status: Ongoing     10/28/2023  Patient-Specific Activity Scoring >/= 4/10 Baseline: SEE OBJECTIVE DATA Goal status: Ongoing      10/28/2023  Berg Balance >/= 40/56 (increase by >8 points) to indicate lower fall risk Baseline: SEE OBJECTIVE DATA Goal status:  Ongoing       10/28/2023  Patient ambulates 100' with prosthesis and cane independently Baseline: SEE OBJECTIVE DATA Goal status: Ongoing       10/28/2023  Patient negotiates ramps & curbs with LRAD & prosthesis modified independent.  Baseline: SEE OBJECTIVE DATA Goal status: Ongoing       10/28/2023  Patient negotiates stairs with single rail & prosthesis modified independent.  Baseline: SEE OBJECTIVE DATA Goal status: Ongoing      10/28/2023   PLAN:  PT FREQUENCY: 1x/week  PT DURATION: 90 days  PLANNED INTERVENTIONS: 97164- PT Re-evaluation, 97110-Therapeutic exercises, 97530- Therapeutic activity, 97112- Neuromuscular re-education, [phone]- Self Care, [id number]- Gait training, [phone]- Prosthetic training, Patient/Family education, Balance training, and Stair training  PLAN FOR NEXT SESSION:   Continue to work on 180*/360* turns, check in about updated HEP, introduce more exercises that challenge his balance for BERG   Ismael Nap, Student-PT, DPT 10/28/2023, 10:59 AM   This entire session of physical therapy was performed under the direct supervision of PT signing evaluation /treatment. PT reviewed note and agrees.   Grayce Spatz, PT, DPT 10/28/2023, 11:51 AM

## 2023-11-11 ENCOUNTER — Encounter: Payer: Self-pay | Admitting: Physical Therapy

## 2023-11-11 ENCOUNTER — Ambulatory Visit: Admitting: Physical Therapy

## 2023-11-11 DIAGNOSIS — R293 Abnormal posture: Secondary | ICD-10-CM | POA: Diagnosis not present

## 2023-11-11 DIAGNOSIS — R2689 Other abnormalities of gait and mobility: Secondary | ICD-10-CM

## 2023-11-11 DIAGNOSIS — M6281 Muscle weakness (generalized): Secondary | ICD-10-CM | POA: Diagnosis not present

## 2023-11-11 DIAGNOSIS — R2681 Unsteadiness on feet: Secondary | ICD-10-CM | POA: Diagnosis not present

## 2023-11-11 DIAGNOSIS — M25652 Stiffness of left hip, not elsewhere classified: Secondary | ICD-10-CM

## 2023-11-11 NOTE — Therapy (Addendum)
 OUTPATIENT PHYSICAL THERAPY PROSTHETIC TREATMENT   Patient Name: Samuel Carney MRN: [id number] DOB:March 25, 1946, 78 y.o., male Today's Date: 11/11/2023  END OF SESSION:  PT End of Session - 11/11/23 0932     Visit Number 21    Number of Visits 25    Date for PT Re-Evaluation 11/25/23    Authorization Type [employer] Medicare dual complete    Authorization Time Period NO AUTH NEEDED, NO COPAY, 10% COINSURANCE    Progress Note Due on Visit 23    PT Start Time 0933    PT Stop Time 1015    PT Time Calculation (min) 42 min    Equipment Utilized During Treatment Gait belt    Activity Tolerance Patient tolerated treatment well    Behavior During Therapy WFL for tasks assessed/performed           Past Medical History:  Diagnosis Date   Alcohol abuse    Aortic atherosclerosis (HCC)    B12 deficiency    Cirrhosis (HCC)    COPD (chronic obstructive pulmonary disease) (HCC)    Coronary artery disease    GERD (gastroesophageal reflux disease)    Glaucoma    Grade II diastolic dysfunction    History of kidney stones    HLD (hyperlipidemia)    Hx of radiation therapy    Hypertension    Lung mass    Moderate mitral regurgitation    Pneumonia    PVD (peripheral vascular disease) (HCC)    Squamous cell carcinoma of lung, right (HCC) 2019   Past Surgical History:  Procedure Laterality Date   COLONOSCOPY WITH PROPOFOL      COLONOSCOPY WITH PROPOFOL  N/A 11/25/2018   Procedure: COLONOSCOPY WITH PROPOFOL ;  Surgeon: Gaylyn Gladis PENNER, MD;  Location: [facility] ENDOSCOPY;  Service: Endoscopy;  Laterality: N/A;   COLONOSCOPY WITH PROPOFOL  N/A 11/29/2021   Procedure: COLONOSCOPY WITH PROPOFOL ;  Surgeon: Toledo, Ladell POUR, MD;  Location: [facility] ENDOSCOPY;  Service: Gastroenterology;  Laterality: N/A;   ENDARTERECTOMY FEMORAL Left 07/13/2020   Procedure: ENDARTERECTOMY FEMORAL ( SFA STENT);  Surgeon: Jama Cordella MATSU, MD;  Location: [facility] ORS;  Service: Vascular;  Laterality: Left;    ESOPHAGOGASTRODUODENOSCOPY (EGD) WITH PROPOFOL  N/A 11/25/2018   Procedure: ESOPHAGOGASTRODUODENOSCOPY (EGD) WITH PROPOFOL ;  Surgeon: Gaylyn Gladis PENNER, MD;  Location: [facility] ENDOSCOPY;  Service: Endoscopy;  Laterality: N/A;   ESOPHAGOGASTRODUODENOSCOPY (EGD) WITH PROPOFOL  N/A 11/29/2021   Procedure: ESOPHAGOGASTRODUODENOSCOPY (EGD) WITH PROPOFOL ;  Surgeon: Toledo, Ladell POUR, MD;  Location: [facility] ENDOSCOPY;  Service: Gastroenterology;  Laterality: N/A;   ESOPHAGOGASTRODUODENOSCOPY (EGD) WITH PROPOFOL  N/A 12/08/2021   Procedure: ESOPHAGOGASTRODUODENOSCOPY (EGD) WITH PROPOFOL ;  Surgeon: Therisa Bi, MD;  Location: [facility] ENDOSCOPY;  Service: Gastroenterology;  Laterality: N/A;   LEG AMPUTATION ABOVE KNEE Left    LOWER EXTREMITY ANGIOGRAPHY Left 06/07/2020   Procedure: LOWER EXTREMITY ANGIOGRAPHY;  Surgeon: Jama Cordella MATSU, MD;  Location: [facility] INVASIVE CV LAB;  Service: Cardiovascular;  Laterality: Left;   LOWER EXTREMITY ANGIOGRAPHY Left 01/10/2021   Procedure: LOWER EXTREMITY ANGIOGRAPHY;  Surgeon: Jama Cordella MATSU, MD;  Location: [facility] INVASIVE CV LAB;  Service: Cardiovascular;  Laterality: Left;   Patient Active Problem List   Diagnosis Date Noted   UGIB (upper gastrointestinal bleed)    Malnutrition of moderate degree 12/06/2021   Melena 12/05/2021   COPD (chronic obstructive pulmonary disease) (HCC)    Grade II diastolic dysfunction    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), left (HCC)    AKI (acute kidney injury) (HCC)    Atherosclerosis  of artery of extremity with ulceration (HCC) 07/13/2020   Atherosclerosis of native arteries of the extremities with ulceration (HCC) 05/19/2020   Alcoholic cirrhosis of 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) 04/09/2019   Hyperlipidemia, mixed 04/09/2019   Malignant 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) 04/09/2019   Thrombocytopenia (HCC) 04/09/2019   CAD (coronary artery disease) 10/14/2017   Lung cancer (HCC) 10/14/2017   Tobacco abuse 08/14/2017    Benign essential hypertension 09/09/2013    PCP: Glover Lenis, MD  REFERRING PROVIDER: Tobie Burgess Opoka, MD  ONSET DATE: 05/14/2023 received new Microprocessor Knee  REFERRING DIAG: Acquired absence of left leg above knee V49.76 & Z89.612  THERAPY DIAG:  Other abnormalities of gait and mobility  Unsteadiness on feet  Muscle weakness (generalized)  Abnormal posture  Stiffness of left hip, not elsewhere classified  Rationale for Evaluation and Treatment: Rehabilitation  SUBJECTIVE:   SUBJECTIVE STATEMENT: Patient overall doing well and is consistent with HEP. He did well with the new exercises added to HEP and had the hardest time with eyes closed.    Pt accompanied by: significant other  PERTINENT HISTORY: PAD, CAD, angina, malignant neoplasm of right lung, HLD, alcoholic cirrhosis of liver, HTN, COPD   PAIN:  Are you having pain? No  PRECAUTIONS: None  WEIGHT BEARING RESTRICTIONS: No  FALLS: Has patient fallen in last 6 months? No  LIVING ENVIRONMENT: Lives with: lives with their spouse Lives in: House Home Access: Stairs to enter and Ramped entrance Home layout: One level and single step into washer / dryer Stairs: Yes: Internal: 1 steps; none and External: 2 steps; on left going up Has following equipment at home: Single point cane, Walker - 2 wheeled, Crutches, Wheelchair (manual), shower chair, and Ramped entry  OCCUPATION: retired  PLOF: walks independently with prosthesis and RW in community. He uses cane in home.   PATIENT GOALS:  to learn to use new prosthesis to walk in home and community with cane.   OBJECTIVE:  Patient-Specific Activity Scoring Scheme  0 represents "unable to perform." 10 represents "able to perform at prior level. 0 1 2 3 4 5 6 7 8 9  10 (Date and Score)   Activity Eval  08/06/23  08/27/23  1. Walking in house with cane  1  6 6   2. standing balance with ADLs  2  6 10   3. stairs 2 6 6   4.     5.     Score 1.67 6 7.33    Total score = sum of the activity scores/number of activities Minimum detectable change (90%CI) for average score = 2 points Minimum detectable change (90%CI) for single activity score = 3 points  COGNITION: Evaluation on 05/30/2023: Overall cognitive status: Within functional limits for tasks assessed  CARDIOVASCULAR RESPONSE: Evaluation on  05/30/2023: Functional activity: gait Pre-activity vitals: HR: 65 SpO2: 97% Post-activity vitals: HR: 86 SpO2: 96% Modified Borg scale for dyspnea: 2: mild shortness of breath  POSTURE: Evaluation on 05/30/2023: weight shift right  LOWER EXTREMITY ROM:  ROM P:passive  A:active Right eval Left eval  Hip flexion    Hip extension  Standing -10*  Hip abduction    Hip adduction    Hip internal rotation    Hip external rotation    Knee flexion    Knee extension    Ankle dorsiflexion    Ankle plantarflexion    Ankle inversion    Ankle eversion     (Blank rows = not tested)  LOWER EXTREMITY MMT:  MMT Left 08/27/23  Hip flexion   Hip extension 4/5  Hip abduction 4/5  Hip adduction   Hip internal rotation   Hip external rotation   Knee flexion   Knee extension   Ankle dorsiflexion   Ankle plantarflexion   Ankle inversion   Ankle eversion   At Evaluation all strength testing is grossly seated and functionally standing / gait. (Blank rows = not tested)  TRANSFERS: 06/25/2023:  Patient able to sit to/from stand engaging hydraulics of prosthetic knee from 18 chair without armrests using BUEs  Evaluation on 05/30/2023: Sit to stand: SBA (cues only) using BUEs on seat of 18 chair and stabilizes without UE support. Minor engagement of MPK prosthesis.  Stand to sit: SBA (cues only) using BUEs on seat of 18 chair and stabilizes without UE support. Minor engagement of MPK prosthesis.   FUNCTIONAL TESTs:  08/27/2023: Lars Balance 35/56 Timed Up & Go: with RW 25.71 sec & with cane PT stopped TUG as pt almost fell.    His  prosthetic knee & foot are too externally rotated resulting in prosthetic knee instability.   08/06/2023: Timed Up & Go: with RW 27.71 sec & with cane 36.81 sec Berg Balance Test:  34/56  06/25/2023:  Patient able to pick up object from floor without UE support using prosthetic hydraulics with supervision.  Evaluation on 05/30/2023:  Berg Balance Scale: 32/56  GAIT: 06/25/2023:  Patient ambulates 100' with cane & prosthesis with minA.  Evaluation on 05/30/2023: Gait pattern: step through pattern, decreased stance time- Left, decreased hip/knee flexion- Left, antalgic, and trunk flexed Distance walked: 150' Assistive device utilized: with TFA MPK prosthesis Single point cane (minA), Walker - 2 wheeled (modified independent / cues for deviations) Level of assistance: Modified independence, SBA, and Min A Gait velocity: with RW comfortable / self-selected 1.03 ft/sec & fast pace 1.96 ft/sec;  with cane & minA comfortable / self-selected 0.67 ft/sec & fast pace 0.69 ft/sec;  CURRENT PROSTHETIC WEAR ASSESSMENT: 08/06/2023: Patient is independent with: skin check, residual limb care, care of non-amputated limb, prosthetic cleaning, ply sock cleaning, and correct ply sock adjustment Patient is dependent with: proper wear schedule/adjustment Donning prosthesis: Modified independence Doffing prosthesis: Modified independence Prosthetic wear tolerance: >90% of awake hours/day, 7 days/week  06/25/2023: patient able to don suction ring socket with proper rotation.   Evaluation on 05/30/2023: Patient is independent with: skin check, residual limb care, care of non-amputated limb, prosthetic cleaning, ply sock cleaning, and correct ply sock adjustment Patient is dependent with: proper wear schedule/adjustment Donning prosthesis: SBA / verbal cues Doffing prosthesis: Modified independence Prosthetic wear tolerance: 10-12 hours/day, 7 days/week Prosthetic weight bearing tolerance: 10 minutes Residual limb  condition: pt denies any issues.  Prosthetic description: silicon liner with suction ring suspension, Microprocessor Knee (MPK), dynamic response foot, Total Contact Socket with flexible inner socket K code/activity level with prosthetic use: Level 3  TODAY'S   TREATMENT:  DATE:  11/11/2023:  Prosthetic Care with Transfemoral Microprocessor Knee (MPK) prosthesis: Patient ambulated 24' with cane and CGA Patient practiced 180* turns in CW and CCW direction with cane and supervision. Practiced by sink for safety for support if needed. Done by walking along sink and then turning 180*, walking along side and turning 180* to go back. Initially had one LOB but did well after a few repetitions.  4 square stepping with cane and minA for all directions except stepping backwards with Lt LE which required modA. Done in CW and CCW direction.  Patient negotiated 6.5 curb with cane and CGA. Patient had difficulty with initial step up to the turn and needed additional HHA. After the first step up, he only need cane and CGA. Patient needed verbal reminders for cane gait sequence for step up and down.  Patient negotiated 12*ramp with cane and CGA.  Patient had difficulty with cane gait pattern and was hesitant but was successful after a couple tries.  TREATMENT:                                                                                                                             DATE:  10/28/2023:  Prosthetic Care with Transfemoral Microprocessor Knee (MPK) prosthesis: Patient amb 55' with cane with CGA and no losses of balance PT verbally instructed in changing shoes to dress shoes can increase speed of heel contact to foot flat which can flex prosthetic knee more rapidly.   Neuro Re-ed The following exercises were done between a sink and a chair for support during balance  exercises Stand with feet together and no UE or cane support,  3x30 seconds  Stand with feet together and no UE or cane support, 2x10 seconds Reach forward with feet shoulder width apart, 3x5 reps each hand. Cues needed to avoid trunk rotation and to lean forward instead Look over shoulder with no UE or cane support, 10x each side Step taps: with B UE support initially and progressed to single UE support, 3x8 180* turns w/ minA, CW and CCW. 5x Cues needed for Lt hip hike to avoid LOB Patient given written instructions for all these exercises except 180* turns to do it at home as HEP   TREATMENT:  DATE:  10/21/2023:  Prosthetic Care with Transfemoral Microprocessor Knee (MPK) prosthesis: Patient amb 47' with cane around 4 obstacles to chair with no armrest with CGA, and no losses of balance. 3x Patient negotiated 6.5 inch curb with cane with minA and needed HHA, 4x.  Initially had a difficult time with cane, prosthetic pattern.  Patient had most trouble when he hesitated. Patient negotiated 12* ramp with cane with modA and needed HHA, 3x.  Patient had most trouble when he hesitated. Patient ambulated 120 feet with CGA and cane.   TREATMENT:                                                                                                                             DATE:  10/14/2023:  Prosthetic Care with Transfemoral Microprocessor Knee (MPK) prosthesis: Pt amb 80' X 2 and 40' X 2 with cane with CGA except one balance loss first walk with minA to stabilize. Pt has instability moment due to shoe resistance at terminal stance then sudden release for swing.  PT trial of simulated leather toe cap with explanation to pt & wife.  The leather toe cap did decrease that instability moment.   Pt verbalized understanding with plan to continue trial for one week.  PT neg 6.5 curb with  cane with modA and needed HHA. Pt neg 4 curb 2 reps with cane with minA & intermittent HHA.  PT demo & verbal cues on using 4 step near sink (on left side) with cane RUE (goal is to not touch counter) pt performed 3 reps with supervision and verbalizes understanding for home.    PATIENT EDUCATION: PATIENT EDUCATED ON FOLLOWING PROSTHETIC CARE: Education details:   Proper doffing with proper rotation Person educated: Patient and Spouse Education method: Explanation, Demonstration, Tactile cues, and Verbal cues Education comprehension: verbalized understanding and needs further education  HOME EXERCISE PROGRAM: Access Code: [email or web address] URL: [email or web address] Date: 09/30/2023 Prepared by: Grayce Spatz  Exercises - Standing Hip Abduction with Theraband Resistance  - 1 x daily - 7 x weekly - 1 sets - 10 reps - Standing Hip Extension with Resistance  - 1 x daily - 7 x weekly - 1 sets - 10 reps - Standing Hip Adduction with Resistance (Mirrored)  - 1 x daily - 7 x weekly - 1 sets - 10 reps - Standing Hip Flexion with Resistance (Mirrored)  - 1 x daily - 7 x weekly - 1 sets - 10 reps - Standing posture with back to counter  - 2 x daily - 7 x weekly - 1 sets - 1 reps - 5-10 minutes hold - Upright Stance at Door Frame Single Arm  - 3-6 x daily - 7 x weekly - 1 sets - 2 reps - 2 deep breathes hold - Upright Stance at Door Frame with Both Arms  - 3-6 x daily - 7 x weekly - 1 sets - 2 reps - 2 deep breathes hold - Modified Debby  Stretch  - 2 x daily - 7 x weekly - 1 sets - 2-3 reps - 30 seconds hold   Try to do these exercises 3-5 times per day.   In front of chair with walker in front for safety: Stand up & sit down 5-10 reps without touching walker unless needed for balance. 5-10 reps Ball up piece of paper.  Lower yourself like sitting down to put paper on floor or pick it up.  Raise back upright similar to standing up.  Pick up 5# object using above technique. 5-10  reps  Near counter top or rail on deck / porch.  Walk forward with left hand near counter looking forward, not staring at floor. 5-10 reps Walk backwards with cane and left hand near counter. 5-10 reps Walk sideways with cane and left hand near counter.  Push off your outside leg.  5-10 reps Use above side stepping to work on turning 90*  push off outside leg and turn other leg to face forward to walk down counter.  When turning to your left you will need a chair back across from counter to touch.    Place 4 step in front of sink or rail and chair back across from it Step up on 4 step with right leg and place prosthetic toes over the edge of step. As soon as prosthetic knee starts to bend step down with Right leg.  Turn around towards counter.  5-10 times   At counter:  Place a chair at end of counter so left hand will be on counter when you stand up. Work on standing up and walking with less hesitancy upon arising. Turn around towards counter at each end.  On return trip your table should now be on your left side to touch as needed.  Work on this 5-10 times.  Side step with cane & left hand on counter.  Going to right, push off left side when stepping.  Going to left, push off right side.  Move chair so back is across from sink:  using cane turn from facing sink going to your left until you are facing the chair back, turn around to face sink going to your right Place 2 bands on floor between chair & sink with enough room between the 2 bands for left foot can fit.   (You can put 2 pieces of tape on floor instead of bands if you want). Turn so your left side is to sink & Hold cane in right hand place prosthetic foot between the 2 lines: step RIGHT foot so toe steps on band or tape behind the prosthetic foot then step so heel touches band or tape in front of left foot.   Turn the chair around to face the sink:  face the chair - bend forward gently placing hands in chair bottom then come back  upright.  Do 5-10 times.   Stand facing the sink & open up the lower cabinet doors:  hold cane in right hand and left hand on sink (as you improve try to hoover left hand over sink). Try to touch right toes to lower shelf of cabinet with control 5-10 reps.    ASSESSMENT:  CLINICAL IMPRESSION:  Patient overall did well with today's session.  He is doing better with 180*turns and able to do them safely with supervision and cane.  Patient had difficulty with confidence and remembering gait pattern for curb and ramp and will benefit from continued practice to be successful  with them.  Patient will continue to benefit from skilled physical therapy to address impairments.  OBJECTIVE IMPAIRMENTS: Abnormal gait, decreased activity tolerance, decreased balance, decreased endurance, decreased knowledge of condition, decreased knowledge of use of DME, decreased mobility, difficulty walking, decreased ROM, decreased strength, and prosthetic dependency .   ACTIVITY LIMITATIONS: carrying, lifting, standing, stairs, transfers, and locomotion level  PARTICIPATION LIMITATIONS: meal prep, community activity, church, and household mobility  PERSONAL FACTORS: Age, Fitness, Past/current experiences, Time since onset of injury/illness/exacerbation, and 3+ comorbidities: see PMH are also affecting patient's functional outcome.   REHAB POTENTIAL: Good  CLINICAL DECISION MAKING: Evolving/moderate complexity  EVALUATION COMPLEXITY: Moderate   GOALS: Goals reviewed with patient? Yes  SHORT TERM GOALS: Target date: 06/27/2023  Patient reports able to don prosthesis with proper rotation modified independent. Baseline: SEE OBJECTIVE DATA Goal status: MET 06/25/2023 2.  Patient able to sit to/from stand engaging hydraulics of prosthetic knee from 18 chair without armrests using BUEs.  Baseline: SEE OBJECTIVE DATA Goal status: MET 06/25/2023  3.  Patient able to pick up object from floor without UE support using  prosthetic hydraulics with supervision. Baseline: SEE OBJECTIVE DATA Goal status: MET  06/25/2023  4. Patient ambulates 100' with cane & prosthesis with minA. Baseline: SEE OBJECTIVE DATA Goal status: MET  06/25/2023   LONG TERM GOALS: Target date: 11/25/2023  Patient demonstrates & verbalized understanding of prosthetic care to enable safe utilization of prosthesis. Baseline: SEE OBJECTIVE DATA Goal status: Ongoing     10/28/2023  Patient-Specific Activity Scoring >/= 4/10 Baseline: SEE OBJECTIVE DATA Goal status: Ongoing      10/28/2023  Berg Balance >/= 40/56 (increase by >8 points) to indicate lower fall risk Baseline: SEE OBJECTIVE DATA Goal status:  Ongoing       10/28/2023  Patient ambulates 100' with prosthesis and cane independently Baseline: SEE OBJECTIVE DATA Goal status: Ongoing       10/28/2023  Patient negotiates ramps & curbs with LRAD & prosthesis modified independent.  Baseline: SEE OBJECTIVE DATA Goal status: Ongoing       10/28/2023  Patient negotiates stairs with single rail & prosthesis modified independent.  Baseline: SEE OBJECTIVE DATA Goal status: Ongoing      10/28/2023   PLAN:  PT FREQUENCY: 1x/week  PT DURATION: 90 days  PLANNED INTERVENTIONS: 97164- PT Re-evaluation, 97110-Therapeutic exercises, 97530- Therapeutic activity, 97112- Neuromuscular re-education, [phone]- Self Care, [id number]- Gait training, [phone]- Prosthetic training, Patient/Family education, Balance training, and Stair training  PLAN FOR NEXT SESSION:   Introduce more exercises that challenge his balance for BERG, ramp and curb   Ismael Nap, Student-PT, DPT 11/11/2023, 5:42 PM  This entire session of physical therapy was performed under the direct supervision of PT signing evaluation /treatment. PT reviewed note and agrees.   Robin Waldron, PT, DPT 11/12/2023, 6:35 AM

## 2023-11-19 ENCOUNTER — Encounter: Payer: Self-pay | Admitting: Physical Therapy

## 2023-11-19 ENCOUNTER — Ambulatory Visit: Admitting: Physical Therapy

## 2023-11-19 DIAGNOSIS — M25652 Stiffness of left hip, not elsewhere classified: Secondary | ICD-10-CM

## 2023-11-19 DIAGNOSIS — R2681 Unsteadiness on feet: Secondary | ICD-10-CM

## 2023-11-19 DIAGNOSIS — M6281 Muscle weakness (generalized): Secondary | ICD-10-CM

## 2023-11-19 DIAGNOSIS — R2689 Other abnormalities of gait and mobility: Secondary | ICD-10-CM | POA: Diagnosis not present

## 2023-11-19 DIAGNOSIS — R293 Abnormal posture: Secondary | ICD-10-CM

## 2023-11-19 NOTE — Therapy (Addendum)
 OUTPATIENT PHYSICAL THERAPY PROSTHETIC TREATMENT   Patient Name: Samuel Carney MRN: [id number] DOB:02-12-1946, 78 y.o., male Today's Date: 11/19/2023  END OF SESSION:  PT End of Session - 11/19/23 0932     Visit Number 22    Number of Visits 25    Date for PT Re-Evaluation 11/25/23    Authorization Type [employer] Medicare dual complete    Authorization Time Period NO AUTH NEEDED, NO COPAY, 10% COINSURANCE    Progress Note Due on Visit 23    PT Start Time 0932    PT Stop Time 1012    PT Time Calculation (min) 40 min    Equipment Utilized During Treatment Gait belt    Activity Tolerance Patient tolerated treatment well    Behavior During Therapy WFL for tasks assessed/performed            Past Medical History:  Diagnosis Date   Alcohol abuse    Aortic atherosclerosis (HCC)    B12 deficiency    Cirrhosis (HCC)    COPD (chronic obstructive pulmonary disease) (HCC)    Coronary artery disease    GERD (gastroesophageal reflux disease)    Glaucoma    Grade II diastolic dysfunction    History of kidney stones    HLD (hyperlipidemia)    Hx of radiation therapy    Hypertension    Lung mass    Moderate mitral regurgitation    Pneumonia    PVD (peripheral vascular disease) (HCC)    Squamous cell carcinoma of lung, right (HCC) 2019   Past Surgical History:  Procedure Laterality Date   COLONOSCOPY WITH PROPOFOL      COLONOSCOPY WITH PROPOFOL  N/A 11/25/2018   Procedure: COLONOSCOPY WITH PROPOFOL ;  Surgeon: Gaylyn Gladis PENNER, MD;  Location: [facility] ENDOSCOPY;  Service: Endoscopy;  Laterality: N/A;   COLONOSCOPY WITH PROPOFOL  N/A 11/29/2021   Procedure: COLONOSCOPY WITH PROPOFOL ;  Surgeon: Toledo, Ladell POUR, MD;  Location: [facility] ENDOSCOPY;  Service: Gastroenterology;  Laterality: N/A;   ENDARTERECTOMY FEMORAL Left 07/13/2020   Procedure: ENDARTERECTOMY FEMORAL ( SFA STENT);  Surgeon: Jama Cordella MATSU, MD;  Location: [facility] ORS;  Service: Vascular;  Laterality: Left;    ESOPHAGOGASTRODUODENOSCOPY (EGD) WITH PROPOFOL  N/A 11/25/2018   Procedure: ESOPHAGOGASTRODUODENOSCOPY (EGD) WITH PROPOFOL ;  Surgeon: Gaylyn Gladis PENNER, MD;  Location: [facility] ENDOSCOPY;  Service: Endoscopy;  Laterality: N/A;   ESOPHAGOGASTRODUODENOSCOPY (EGD) WITH PROPOFOL  N/A 11/29/2021   Procedure: ESOPHAGOGASTRODUODENOSCOPY (EGD) WITH PROPOFOL ;  Surgeon: Toledo, Ladell POUR, MD;  Location: [facility] ENDOSCOPY;  Service: Gastroenterology;  Laterality: N/A;   ESOPHAGOGASTRODUODENOSCOPY (EGD) WITH PROPOFOL  N/A 12/08/2021   Procedure: ESOPHAGOGASTRODUODENOSCOPY (EGD) WITH PROPOFOL ;  Surgeon: Therisa Bi, MD;  Location: [facility] ENDOSCOPY;  Service: Gastroenterology;  Laterality: N/A;   LEG AMPUTATION ABOVE KNEE Left    LOWER EXTREMITY ANGIOGRAPHY Left 06/07/2020   Procedure: LOWER EXTREMITY ANGIOGRAPHY;  Surgeon: Jama Cordella MATSU, MD;  Location: [facility] INVASIVE CV LAB;  Service: Cardiovascular;  Laterality: Left;   LOWER EXTREMITY ANGIOGRAPHY Left 01/10/2021   Procedure: LOWER EXTREMITY ANGIOGRAPHY;  Surgeon: Jama Cordella MATSU, MD;  Location: [facility] INVASIVE CV LAB;  Service: Cardiovascular;  Laterality: Left;   Patient Active Problem List   Diagnosis Date Noted   UGIB (upper gastrointestinal bleed)    Malnutrition of moderate degree 12/06/2021   Melena 12/05/2021   COPD (chronic obstructive pulmonary disease) (HCC)    Grade II diastolic dysfunction    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), left (HCC)    AKI (acute kidney injury) (HCC)  Atherosclerosis of artery of extremity with ulceration (HCC) 07/13/2020   Atherosclerosis of native arteries of the extremities with ulceration (HCC) 05/19/2020   Alcoholic cirrhosis of 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) 04/09/2019   Hyperlipidemia, mixed 04/09/2019   Malignant 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) 04/09/2019   Thrombocytopenia (HCC) 04/09/2019   CAD (coronary artery disease) 10/14/2017   Lung cancer (HCC) 10/14/2017   Tobacco abuse 08/14/2017    Benign essential hypertension 09/09/2013    PCP: Glover Lenis, MD  REFERRING PROVIDER: Tobie Burgess Opoka, MD  ONSET DATE: 05/14/2023 received new Microprocessor Knee  REFERRING DIAG: Acquired absence of left leg above knee V49.76 & Z89.612  THERAPY DIAG:  Other abnormalities of gait and mobility  Unsteadiness on feet  Muscle weakness (generalized)  Abnormal posture  Stiffness of left hip, not elsewhere classified  Rationale for Evaluation and Treatment: Rehabilitation  SUBJECTIVE:   SUBJECTIVE STATEMENT: Patient overall doing well and reports no new changes   Pt accompanied by: significant other  PERTINENT HISTORY: PAD, CAD, angina, malignant neoplasm of right lung, HLD, alcoholic cirrhosis of liver, HTN, COPD   PAIN:  Are you having pain? No  PRECAUTIONS: None  WEIGHT BEARING RESTRICTIONS: No  FALLS: Has patient fallen in last 6 months? No  LIVING ENVIRONMENT: Lives with: lives with their spouse Lives in: House Home Access: Stairs to enter and Ramped entrance Home layout: One level and single step into washer / dryer Stairs: Yes: Internal: 1 steps; none and External: 2 steps; on left going up Has following equipment at home: Single point cane, Walker - 2 wheeled, Crutches, Wheelchair (manual), shower chair, and Ramped entry  OCCUPATION: retired  PLOF: walks independently with prosthesis and RW in community. He uses cane in home.   PATIENT GOALS:  to learn to use new prosthesis to walk in home and community with cane.   OBJECTIVE:  Patient-Specific Activity Scoring Scheme  0 represents "unable to perform." 10 represents "able to perform at prior level. 0 1 2 3 4 5 6 7 8 9  10 (Date and Score)   Activity Eval  08/06/23  08/27/23  1. Walking in house with cane  1  6 6   2. standing balance with ADLs  2  6 10   3. stairs 2 6 6   4.     5.     Score 1.67 6 7.33   Total score = sum of the activity scores/number of activities Minimum detectable change  (90%CI) for average score = 2 points Minimum detectable change (90%CI) for single activity score = 3 points  COGNITION: Evaluation on 05/30/2023: Overall cognitive status: Within functional limits for tasks assessed  CARDIOVASCULAR RESPONSE: Evaluation on  05/30/2023: Functional activity: gait Pre-activity vitals: HR: 65 SpO2: 97% Post-activity vitals: HR: 86 SpO2: 96% Modified Borg scale for dyspnea: 2: mild shortness of breath  POSTURE: Evaluation on 05/30/2023: weight shift right  LOWER EXTREMITY ROM:  ROM P:passive  A:active Right eval Left eval  Hip flexion    Hip extension  Standing -10*  Hip abduction    Hip adduction    Hip internal rotation    Hip external rotation    Knee flexion    Knee extension    Ankle dorsiflexion    Ankle plantarflexion    Ankle inversion    Ankle eversion     (Blank rows = not tested)  LOWER EXTREMITY MMT:  MMT Left 08/27/23  Hip flexion   Hip extension 4/5  Hip abduction 4/5  Hip  adduction   Hip internal rotation   Hip external rotation   Knee flexion   Knee extension   Ankle dorsiflexion   Ankle plantarflexion   Ankle inversion   Ankle eversion   At Evaluation all strength testing is grossly seated and functionally standing / gait. (Blank rows = not tested)  TRANSFERS: 06/25/2023:  Patient able to sit to/from stand engaging hydraulics of prosthetic knee from 18 chair without armrests using BUEs  Evaluation on 05/30/2023: Sit to stand: SBA (cues only) using BUEs on seat of 18 chair and stabilizes without UE support. Minor engagement of MPK prosthesis.  Stand to sit: SBA (cues only) using BUEs on seat of 18 chair and stabilizes without UE support. Minor engagement of MPK prosthesis.   FUNCTIONAL TESTs:  08/27/2023: Lars Balance 35/56 Timed Up & Go: with RW 25.71 sec & with cane PT stopped TUG as pt almost fell.    His prosthetic knee & foot are too externally rotated resulting in prosthetic knee instability.    08/06/2023: Timed Up & Go: with RW 27.71 sec & with cane 36.81 sec Berg Balance Test:  34/56  06/25/2023:  Patient able to pick up object from floor without UE support using prosthetic hydraulics with supervision.  Evaluation on 05/30/2023:  Berg Balance Scale: 32/56  GAIT: 06/25/2023:  Patient ambulates 100' with cane & prosthesis with minA.  Evaluation on 05/30/2023: Gait pattern: step through pattern, decreased stance time- Left, decreased hip/knee flexion- Left, antalgic, and trunk flexed Distance walked: 150' Assistive device utilized: with TFA MPK prosthesis Single point cane (minA), Walker - 2 wheeled (modified independent / cues for deviations) Level of assistance: Modified independence, SBA, and Min A Gait velocity: with RW comfortable / self-selected 1.03 ft/sec & fast pace 1.96 ft/sec;  with cane & minA comfortable / self-selected 0.67 ft/sec & fast pace 0.69 ft/sec;  CURRENT PROSTHETIC WEAR ASSESSMENT: 08/06/2023: Patient is independent with: skin check, residual limb care, care of non-amputated limb, prosthetic cleaning, ply sock cleaning, and correct ply sock adjustment Patient is dependent with: proper wear schedule/adjustment Donning prosthesis: Modified independence Doffing prosthesis: Modified independence Prosthetic wear tolerance: >90% of awake hours/day, 7 days/week  06/25/2023: patient able to don suction ring socket with proper rotation.   Evaluation on 05/30/2023: Patient is independent with: skin check, residual limb care, care of non-amputated limb, prosthetic cleaning, ply sock cleaning, and correct ply sock adjustment Patient is dependent with: proper wear schedule/adjustment Donning prosthesis: SBA / verbal cues Doffing prosthesis: Modified independence Prosthetic wear tolerance: 10-12 hours/day, 7 days/week Prosthetic weight bearing tolerance: 10 minutes Residual limb condition: pt denies any issues.  Prosthetic description: silicon liner with suction ring  suspension, Microprocessor Knee (MPK), dynamic response foot, Total Contact Socket with flexible inner socket K code/activity level with prosthetic use: Level 3  TODAY'S   TREATMENT:  DATE:  11/19/2023:  Prosthetic Care with Transfemoral Microprocessor Knee (MPK) prosthesis: 4 square stepping with cane and minA for all directions except stepping backwards with Lt LE which required modA. Done in CW and CCW direction. Patient negotiated 6.5 curb with cane & minA when descending the curb and with cane and modA/HHA when ascending the curb. Patient has a difficult time with SL balance and strength while ascending the curb. Additionally patient leans forward too much causing him to be off balance. Patient required verbal reminders for cane gait sequence Patient negotiated 12* ramp with cane & minA when ascending the ramp and with cane and modA/HHA when descending the ramp. Patient leans forward when descending the ramp which bring his COM forward and throws him off balance. Required verbal cues to shorten steps and bringing weight fully on prosthesis when ascending the ramp Patient ambulated with cane and CGA while scanning in laterally and up/down direction.  Patient had a tendency to walk towards his left side when looking in the left direction.  TREATMENT:                                                                                                                             DATE:  11/11/2023:  Prosthetic Care with Transfemoral Microprocessor Knee (MPK) prosthesis: Patient ambulated 72' with cane and CGA Patient practiced 180* turns in CW and CCW direction with cane and supervision. Practiced by sink for safety for support if needed. Done by walking along sink and then turning 180*, walking along side and turning 180* to go back. Initially had one LOB but did well after a few  repetitions.  4 square stepping with cane and minA for all directions except stepping backwards with Lt LE which required modA. Done in CW and CCW direction.  Patient negotiated 6.5 curb with cane and CGA. Patient had difficulty with initial step up to the turn and needed additional HHA. After the first step up, he only need cane and CGA. Patient needed verbal reminders for cane gait sequence for step up and down.  Patient negotiated 12*ramp with cane and CGA.  Patient had difficulty with cane gait pattern and was hesitant but was successful after a couple tries.  TREATMENT:                                                                                                                             DATE:  10/28/2023:  Prosthetic Care with Transfemoral Microprocessor Knee (MPK) prosthesis: Patient amb 31' with cane with CGA and no losses of balance PT verbally instructed in changing shoes to dress shoes can increase speed of heel contact to foot flat which can flex prosthetic knee more rapidly.   Neuro Re-ed The following exercises were done between a sink and a chair for support during balance exercises Stand with feet together and no UE or cane support,  3x30 seconds  Stand with feet together and no UE or cane support, 2x10 seconds Reach forward with feet shoulder width apart, 3x5 reps each hand. Cues needed to avoid trunk rotation and to lean forward instead Look over shoulder with no UE or cane support, 10x each side Step taps: with B UE support initially and progressed to single UE support, 3x8 180* turns w/ minA, CW and CCW. 5x Cues needed for Lt hip hike to avoid LOB Patient given written instructions for all these exercises except 180* turns to do it at home as HEP   PATIENT EDUCATION: PATIENT EDUCATED ON FOLLOWING PROSTHETIC CARE: Education details:   Proper doffing with proper rotation Person educated: Patient and Spouse Education method: Explanation, Demonstration, Tactile cues, and  Verbal cues Education comprehension: verbalized understanding and needs further education  HOME EXERCISE PROGRAM: Access Code: [email or web address] URL: [email or web address] Date: 09/30/2023 Prepared by: Grayce Spatz  Exercises - Standing Hip Abduction with Theraband Resistance  - 1 x daily - 7 x weekly - 1 sets - 10 reps - Standing Hip Extension with Resistance  - 1 x daily - 7 x weekly - 1 sets - 10 reps - Standing Hip Adduction with Resistance (Mirrored)  - 1 x daily - 7 x weekly - 1 sets - 10 reps - Standing Hip Flexion with Resistance (Mirrored)  - 1 x daily - 7 x weekly - 1 sets - 10 reps - Standing posture with back to counter  - 2 x daily - 7 x weekly - 1 sets - 1 reps - 5-10 minutes hold - Upright Stance at Door Frame Single Arm  - 3-6 x daily - 7 x weekly - 1 sets - 2 reps - 2 deep breathes hold - Upright Stance at Door Frame with Both Arms  - 3-6 x daily - 7 x weekly - 1 sets - 2 reps - 2 deep breathes hold - Modified Thomas Stretch  - 2 x daily - 7 x weekly - 1 sets - 2-3 reps - 30 seconds hold   Try to do these exercises 3-5 times per day.   In front of chair with walker in front for safety: Stand up & sit down 5-10 reps without touching walker unless needed for balance. 5-10 reps Ball up piece of paper.  Lower yourself like sitting down to put paper on floor or pick it up.  Raise back upright similar to standing up.  Pick up 5# object using above technique. 5-10 reps  Near counter top or rail on deck / porch.  Walk forward with left hand near counter looking forward, not staring at floor. 5-10 reps Walk backwards with cane and left hand near counter. 5-10 reps Walk sideways with cane and left hand near counter.  Push off your outside leg.  5-10 reps Use above side stepping to work on turning 90*  push off outside leg and turn other leg to face forward to walk down counter.  When turning to your left you will need a chair back across from counter  to touch.    Place  4 step in front of sink or rail and chair back across from it Step up on 4 step with right leg and place prosthetic toes over the edge of step. As soon as prosthetic knee starts to bend step down with Right leg.  Turn around towards counter.  5-10 times   At counter:  Place a chair at end of counter so left hand will be on counter when you stand up. Work on standing up and walking with less hesitancy upon arising. Turn around towards counter at each end.  On return trip your table should now be on your left side to touch as needed.  Work on this 5-10 times.  Side step with cane & left hand on counter.  Going to right, push off left side when stepping.  Going to left, push off right side.  Move chair so back is across from sink:  using cane turn from facing sink going to your left until you are facing the chair back, turn around to face sink going to your right Place 2 bands on floor between chair & sink with enough room between the 2 bands for left foot can fit.   (You can put 2 pieces of tape on floor instead of bands if you want). Turn so your left side is to sink & Hold cane in right hand place prosthetic foot between the 2 lines: step RIGHT foot so toe steps on band or tape behind the prosthetic foot then step so heel touches band or tape in front of left foot.   Turn the chair around to face the sink:  face the chair - bend forward gently placing hands in chair bottom then come back upright.  Do 5-10 times.   Stand facing the sink & open up the lower cabinet doors:  hold cane in right hand and left hand on sink (as you improve try to hoover left hand over sink). Try to touch right toes to lower shelf of cabinet with control 5-10 reps.    ASSESSMENT:  CLINICAL IMPRESSION:  Patient tolerated treatment well today. Patient had difficulty negotiating ramp and curb due to single-leg balance difficulty, weakness, and weight shifting appropriately onto prosthesis.  Patient will continue to benefit from  skilled physical therapy to address impairments.  OBJECTIVE IMPAIRMENTS: Abnormal gait, decreased activity tolerance, decreased balance, decreased endurance, decreased knowledge of condition, decreased knowledge of use of DME, decreased mobility, difficulty walking, decreased ROM, decreased strength, and prosthetic dependency .   ACTIVITY LIMITATIONS: carrying, lifting, standing, stairs, transfers, and locomotion level  PARTICIPATION LIMITATIONS: meal prep, community activity, church, and household mobility  PERSONAL FACTORS: Age, Fitness, Past/current experiences, Time since onset of injury/illness/exacerbation, and 3+ comorbidities: see PMH are also affecting patient's functional outcome.   REHAB POTENTIAL: Good  CLINICAL DECISION MAKING: Evolving/moderate complexity  EVALUATION COMPLEXITY: Moderate   GOALS: Goals reviewed with patient? Yes  SHORT TERM GOALS: Target date: 06/27/2023  Patient reports able to don prosthesis with proper rotation modified independent. Baseline: SEE OBJECTIVE DATA Goal status: MET 06/25/2023 2.  Patient able to sit to/from stand engaging hydraulics of prosthetic knee from 18 chair without armrests using BUEs.  Baseline: SEE OBJECTIVE DATA Goal status: MET 06/25/2023  3.  Patient able to pick up object from floor without UE support using prosthetic hydraulics with supervision. Baseline: SEE OBJECTIVE DATA Goal status: MET  06/25/2023  4. Patient ambulates 100' with cane & prosthesis with minA. Baseline: SEE OBJECTIVE DATA  Goal status: MET  06/25/2023   LONG TERM GOALS: Target date: 11/25/2023  Patient demonstrates & verbalized understanding of prosthetic care to enable safe utilization of prosthesis. Baseline: SEE OBJECTIVE DATA Goal status: Ongoing     10/28/2023  Patient-Specific Activity Scoring >/= 4/10 Baseline: SEE OBJECTIVE DATA Goal status: Ongoing      10/28/2023  Berg Balance >/= 40/56 (increase by >8 points) to indicate lower fall  risk Baseline: SEE OBJECTIVE DATA Goal status:  Ongoing       10/28/2023  Patient ambulates 100' with prosthesis and cane independently Baseline: SEE OBJECTIVE DATA Goal status: Ongoing       10/28/2023  Patient negotiates ramps & curbs with LRAD & prosthesis modified independent.  Baseline: SEE OBJECTIVE DATA Goal status: Ongoing       10/28/2023  Patient negotiates stairs with single rail & prosthesis modified independent.  Baseline: SEE OBJECTIVE DATA Goal status: Ongoing      10/28/2023   PLAN:  PT FREQUENCY: 1x/week  PT DURATION: 90 days  PLANNED INTERVENTIONS: 97164- PT Re-evaluation, 97110-Therapeutic exercises, 97530- Therapeutic activity, 97112- Neuromuscular re-education, [phone]- Self Care, [id number]- Gait training, [phone]- Prosthetic training, Patient/Family education, Balance training, and Stair training  PLAN FOR NEXT SESSION: check LTGs and discuss discharge   Ismael Nap, Student-PT 11/19/2023, 3:30 PM  This entire session of physical therapy was performed under the direct supervision of PT signing evaluation /treatment. PT reviewed note and agrees.   Grayce Spatz, PT, DPT 11/19/2023, 5:04 PM

## 2023-11-26 ENCOUNTER — Encounter: Payer: Self-pay | Admitting: Physical Therapy

## 2023-11-26 ENCOUNTER — Ambulatory Visit: Admitting: Physical Therapy

## 2023-11-26 DIAGNOSIS — R293 Abnormal posture: Secondary | ICD-10-CM | POA: Diagnosis not present

## 2023-11-26 DIAGNOSIS — R2681 Unsteadiness on feet: Secondary | ICD-10-CM

## 2023-11-26 DIAGNOSIS — R2689 Other abnormalities of gait and mobility: Secondary | ICD-10-CM

## 2023-11-26 DIAGNOSIS — M6281 Muscle weakness (generalized): Secondary | ICD-10-CM

## 2023-11-26 DIAGNOSIS — M25652 Stiffness of left hip, not elsewhere classified: Secondary | ICD-10-CM

## 2023-11-26 NOTE — Therapy (Addendum)
 OUTPATIENT PHYSICAL THERAPY PROSTHETIC TREATMENT & DISCHARGE   Patient Name: Samuel Carney MRN: [id number] DOB:[birth year], 78 y.o., male Today's Date: 11/26/2023  PHYSICAL THERAPY DISCHARGE SUMMARY  Visits from Start of Care: 23  Current functional level related to goals / functional outcomes: See below   Remaining deficits: See below   Education / Equipment: See below   Patient agrees to discharge. Patient goals were partially met. Patient is being discharged due to being pleased with the current functional level.   END OF SESSION:  PT End of Session - 11/26/23 0928     Visit Number 23    Number of Visits 25    Date for PT Re-Evaluation 11/25/23    Authorization Type [employer] Medicare dual complete    Authorization Time Period NO AUTH NEEDED, NO COPAY, 10% COINSURANCE    Progress Note Due on Visit 23    PT Start Time 0928    PT Stop Time 1002    PT Time Calculation (min) 34 min    Equipment Utilized During Treatment Gait belt    Activity Tolerance Patient tolerated treatment well    Behavior During Therapy WFL for tasks assessed/performed             Past Medical History:  Diagnosis Date   Alcohol abuse    Aortic atherosclerosis (HCC)    B12 deficiency    Cirrhosis (HCC)    COPD (chronic obstructive pulmonary disease) (HCC)    Coronary artery disease    GERD (gastroesophageal reflux disease)    Glaucoma    Grade II diastolic dysfunction    History of kidney stones    HLD (hyperlipidemia)    Hx of radiation therapy    Hypertension    Lung mass    Moderate mitral regurgitation    Pneumonia    PVD (peripheral vascular disease) (HCC)    Squamous cell carcinoma of lung, right (HCC) 2019   Past Surgical History:  Procedure Laterality Date   COLONOSCOPY WITH PROPOFOL      COLONOSCOPY WITH PROPOFOL  N/A 11/25/2018   Procedure: COLONOSCOPY WITH PROPOFOL ;  Surgeon: Gaylyn Gladis PENNER, MD;  Location: [facility] ENDOSCOPY;  Service: Endoscopy;  Laterality: N/A;    COLONOSCOPY WITH PROPOFOL  N/A 11/29/2021   Procedure: COLONOSCOPY WITH PROPOFOL ;  Surgeon: Toledo, Ladell POUR, MD;  Location: [facility] ENDOSCOPY;  Service: Gastroenterology;  Laterality: N/A;   ENDARTERECTOMY FEMORAL Left 07/13/2020   Procedure: ENDARTERECTOMY FEMORAL ( SFA STENT);  Surgeon: Jama Cordella MATSU, MD;  Location: [facility] ORS;  Service: Vascular;  Laterality: Left;   ESOPHAGOGASTRODUODENOSCOPY (EGD) WITH PROPOFOL  N/A 11/25/2018   Procedure: ESOPHAGOGASTRODUODENOSCOPY (EGD) WITH PROPOFOL ;  Surgeon: Gaylyn Gladis PENNER, MD;  Location: [facility] ENDOSCOPY;  Service: Endoscopy;  Laterality: N/A;   ESOPHAGOGASTRODUODENOSCOPY (EGD) WITH PROPOFOL  N/A 11/29/2021   Procedure: ESOPHAGOGASTRODUODENOSCOPY (EGD) WITH PROPOFOL ;  Surgeon: Toledo, Ladell POUR, MD;  Location: [facility] ENDOSCOPY;  Service: Gastroenterology;  Laterality: N/A;   ESOPHAGOGASTRODUODENOSCOPY (EGD) WITH PROPOFOL  N/A 12/08/2021   Procedure: ESOPHAGOGASTRODUODENOSCOPY (EGD) WITH PROPOFOL ;  Surgeon: Therisa Bi, MD;  Location: [facility] ENDOSCOPY;  Service: Gastroenterology;  Laterality: N/A;   LEG AMPUTATION ABOVE KNEE Left    LOWER EXTREMITY ANGIOGRAPHY Left 06/07/2020   Procedure: LOWER EXTREMITY ANGIOGRAPHY;  Surgeon: Jama Cordella MATSU, MD;  Location: [facility] INVASIVE CV LAB;  Service: Cardiovascular;  Laterality: Left;   LOWER EXTREMITY ANGIOGRAPHY Left 01/10/2021   Procedure: LOWER EXTREMITY ANGIOGRAPHY;  Surgeon: Jama Cordella MATSU, MD;  Location: [facility] INVASIVE CV LAB;  Service: Cardiovascular;  Laterality: Left;   Patient  Active Problem List   Diagnosis Date Noted   UGIB (upper gastrointestinal bleed)    Malnutrition of moderate degree 12/06/2021   Melena 12/05/2021   COPD (chronic obstructive pulmonary disease) (HCC)    Grade II diastolic dysfunction    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), left (HCC)    AKI (acute kidney injury) (HCC)    Atherosclerosis of artery of extremity with ulceration (HCC) 07/13/2020   Atherosclerosis of native  arteries of the extremities with ulceration (HCC) 05/19/2020   Alcoholic cirrhosis of 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) 04/09/2019   Hyperlipidemia, mixed 04/09/2019   Malignant 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) 04/09/2019   Thrombocytopenia (HCC) 04/09/2019   CAD (coronary artery disease) 10/14/2017   Lung cancer (HCC) 10/14/2017   Tobacco abuse 08/14/2017   Benign essential hypertension 09/09/2013    PCP: Glover Lenis, MD  REFERRING PROVIDER: Tobie Burgess Opoka, MD  ONSET DATE: 05/14/2023 received new Microprocessor Knee  REFERRING DIAG: Acquired absence of left leg above knee V49.76 & Z89.612  THERAPY DIAG:  Other abnormalities of gait and mobility  Unsteadiness on feet  Muscle weakness (generalized)  Abnormal posture  Stiffness of left hip, not elsewhere classified  Rationale for Evaluation and Treatment: Rehabilitation  SUBJECTIVE:   SUBJECTIVE STATEMENT: Patient overall doing well and reports no new changes. Consistent with HEP.    Pt accompanied by: significant other  PERTINENT HISTORY: PAD, CAD, angina, malignant neoplasm of right lung, HLD, alcoholic cirrhosis of liver, HTN, COPD   PAIN:  Are you having pain? No  PRECAUTIONS: None  WEIGHT BEARING RESTRICTIONS: No  FALLS: Has patient fallen in last 6 months? No  LIVING ENVIRONMENT: Lives with: lives with their spouse Lives in: House Home Access: Stairs to enter and Ramped entrance Home layout: One level and single step into washer / dryer Stairs: Yes: Internal: 1 steps; none and External: 2 steps; on left going up Has following equipment at home: Single point cane, Walker - 2 wheeled, Crutches, Wheelchair (manual), shower chair, and Ramped entry  OCCUPATION: retired  PLOF: walks independently with prosthesis and RW in community. He uses cane in home.   PATIENT GOALS:  to learn to use new prosthesis to walk in home and community with cane.   OBJECTIVE:  Patient-Specific Activity Scoring  Scheme  0 represents "unable to perform." 10 represents "able to perform at prior level. 0 1 2 3 4 5 6 7 8 9  10 (Date and Score)   Activity Eval  08/06/23  08/27/23 11/26/2023  1. Walking in house with cane  1  6 6 8   2. standing balance with ADLs  2  6 10 10   3. stairs 2 6 6 7   4.      5.      Score 1.67 6 7.33 9   Total score = sum of the activity scores/number of activities Minimum detectable change (90%CI) for average score = 2 points Minimum detectable change (90%CI) for single activity score = 3 points  COGNITION: Evaluation on 05/30/2023: Overall cognitive status: Within functional limits for tasks assessed  CARDIOVASCULAR RESPONSE: Evaluation on  05/30/2023: Functional activity: gait Pre-activity vitals: HR: 65 SpO2: 97% Post-activity vitals: HR: 86 SpO2: 96% Modified Borg scale for dyspnea: 2: mild shortness of breath  POSTURE: Evaluation on 05/30/2023: weight shift right  LOWER EXTREMITY ROM:  ROM P:passive  A:active Right eval Left eval  Hip flexion    Hip extension  Standing -10*  Hip abduction  Hip adduction    Hip internal rotation    Hip external rotation    Knee flexion    Knee extension    Ankle dorsiflexion    Ankle plantarflexion    Ankle inversion    Ankle eversion     (Blank rows = not tested)  LOWER EXTREMITY MMT:  MMT Left 08/27/23  Hip flexion   Hip extension 4/5  Hip abduction 4/5  Hip adduction   Hip internal rotation   Hip external rotation   Knee flexion   Knee extension   Ankle dorsiflexion   Ankle plantarflexion   Ankle inversion   Ankle eversion   At Evaluation all strength testing is grossly seated and functionally standing / gait. (Blank rows = not tested)  TRANSFERS: 06/25/2023:  Patient able to sit to/from stand engaging hydraulics of prosthetic knee from 18 chair without armrests using BUEs  Evaluation on 05/30/2023: Sit to stand: SBA (cues only) using BUEs on seat of 18 chair and stabilizes without UE  support. Minor engagement of MPK prosthesis.  Stand to sit: SBA (cues only) using BUEs on seat of 18 chair and stabilizes without UE support. Minor engagement of MPK prosthesis.   FUNCTIONAL TESTs:  11/26/2023:  Lars Balance:  41/56  [facility] PT Assessment - 11/26/23 0933       Standardized Balance Assessment   Standardized Balance Assessment Berg Balance Test      Berg Balance Test   Sit to Stand Able to stand  independently using hands    Standing Unsupported Able to stand safely 2 minutes    Sitting with Back Unsupported but Feet Supported on Floor or Stool Able to sit safely and securely 2 minutes    Stand to Sit Sits safely with minimal use of hands    Transfers Able to transfer safely, minor use of hands    Standing Unsupported with Eyes Closed Able to stand 10 seconds safely    Standing Unsupported with Feet Together Able to place feet together independently and stand 1 minute safely    From Standing, Reach Forward with Outstretched Arm Can reach forward >12 cm safely (5)    From Standing Position, Pick up Object from Floor Able to pick up shoe safely and easily    From Standing Position, Turn to Look Behind Over each Shoulder Looks behind one side only/other side shows less weight shift    Turn 360 Degrees Needs close supervision or verbal cueing    Standing Unsupported, Alternately Place Feet on Step/Stool Needs assistance to keep from falling or unable to try    Standing Unsupported, One Foot in Front Able to take small step independently and hold 30 seconds    Standing on One Leg Tries to lift leg/unable to hold 3 seconds but remains standing independently    Total Score 41    Berg comment: < 36 high risk for falls (close to 100%) 46-51 moderate (>50%)   37-45 significant (>80%) 52-55 lower (> 25%)           08/27/2023: Lars Balance 35/56 Timed Up & Go: with RW 25.71 sec & with cane PT stopped TUG as pt almost fell.    His prosthetic knee & foot are too externally rotated  resulting in prosthetic knee instability.   [facility] PT Assessment - 11/26/23 0933       Standardized Balance Assessment   Standardized Balance Assessment Berg Balance Test      Berg Balance Test   Sit to Stand Able  to stand  independently using hands    Standing Unsupported Able to stand safely 2 minutes    Sitting with Back Unsupported but Feet Supported on Floor or Stool Able to sit safely and securely 2 minutes    Stand to Sit Sits safely with minimal use of hands    Transfers Able to transfer safely, minor use of hands    Standing Unsupported with Eyes Closed Able to stand 10 seconds safely    Standing Unsupported with Feet Together Able to place feet together independently and stand 1 minute safely    From Standing, Reach Forward with Outstretched Arm Can reach forward >12 cm safely (5)    From Standing Position, Pick up Object from Floor Able to pick up shoe safely and easily    From Standing Position, Turn to Look Behind Over each Shoulder Looks behind one side only/other side shows less weight shift    Turn 360 Degrees Needs close supervision or verbal cueing    Standing Unsupported, Alternately Place Feet on Step/Stool Needs assistance to keep from falling or unable to try    Standing Unsupported, One Foot in Front Able to take small step independently and hold 30 seconds    Standing on One Leg Tries to lift leg/unable to hold 3 seconds but remains standing independently    Total Score 41    Berg comment: < 36 high risk for falls (close to 100%) 46-51 moderate (>50%)   37-45 significant (>80%) 52-55 lower (> 25%)         08/06/2023: Timed Up & Go: with RW 27.71 sec & with cane 36.81 sec Berg Balance Test:  34/56  06/25/2023:  Patient able to pick up object from floor without UE support using prosthetic hydraulics with supervision.  Evaluation on 05/30/2023:  Lars Balance Scale: 32/56  GAIT: 11/26/2023:  Gait velocity with cane self-selected 0.72 ft/sec Gait velocity with RW  self-selected 2.85ft/sec Gait velocity with RW fast-pace 2.79ft/sec Patient negotiated 6.5 curb with cane, minA, and occasional modA/HHA. Patient needed verbal reminders for cane/prosthesis sequence to get up/down curb. Patient negotiated 12* ramp with cane, minA, and occasional modA/HHA. Patient had difficulty with step length when going up/down ramp Patient ambulated 30' with cane and supervision / modified independent and sit/stand to 18 chair with arm rest   06/25/2023:  Patient ambulates 100' with cane & prosthesis with minA.  Evaluation on 05/30/2023: Gait pattern: step through pattern, decreased stance time- Left, decreased hip/knee flexion- Left, antalgic, and trunk flexed Distance walked: 150' Assistive device utilized: with TFA MPK prosthesis Single point cane (minA), Walker - 2 wheeled (modified independent / cues for deviations) Level of assistance: Modified independence, SBA, and Min A Gait velocity: with RW comfortable / self-selected 1.03 ft/sec & fast pace 1.96 ft/sec;  with cane & minA comfortable / self-selected 0.67 ft/sec & fast pace 0.69 ft/sec;  CURRENT PROSTHETIC WEAR ASSESSMENT: 08/06/2023: Patient is independent with: skin check, residual limb care, care of non-amputated limb, prosthetic cleaning, ply sock cleaning, and correct ply sock adjustment Patient is dependent with: proper wear schedule/adjustment Donning prosthesis: Modified independence Doffing prosthesis: Modified independence Prosthetic wear tolerance: >90% of awake hours/day, 7 days/week  06/25/2023: patient able to don suction ring socket with proper rotation.   Evaluation on 05/30/2023: Patient is independent with: skin check, residual limb care, care of non-amputated limb, prosthetic cleaning, ply sock cleaning, and correct ply sock adjustment Patient is dependent with: proper wear schedule/adjustment Donning prosthesis: SBA / verbal cues Doffing prosthesis:  Modified independence Prosthetic wear  tolerance: 10-12 hours/day, 7 days/week Prosthetic weight bearing tolerance: 10 minutes Residual limb condition: pt denies any issues.  Prosthetic description: silicon liner with suction ring suspension, Microprocessor Knee (MPK), dynamic response foot, Total Contact Socket with flexible inner socket K code/activity level with prosthetic use: Level 3  TODAY'S   TREATMENT:                                                                                                                             DATE:  11/26/2023:  Prosthetic Care with Transfemoral Microprocessor Knee (MPK) prosthesis: Patient negotiated 6.5 curb with cane, minA, and occasional modA/HHA. Patient needed verbal reminders for cane/prosthesis sequence to get up/down curb. Patient negotiated 12* ramp with cane, minA, and occasional modA/HHA. Patient had difficulty with step length when going up/down ramp Patient ambulated 45' with cane and supervision / modified independent and sit/stand to 18 chair with arm rest  Physical Performance Testing: Gait velocity and BERG Balance measurements taken and can be seen in Objectives   TREATMENT:                                                                                                                             DATE:  11/19/2023:  Prosthetic Care with Transfemoral Microprocessor Knee (MPK) prosthesis: 4 square stepping with cane and minA for all directions except stepping backwards with Lt LE which required modA. Done in CW and CCW direction. Patient negotiated 6.5 curb with cane & minA when descending the curb and with cane and modA/HHA when ascending the curb. Patient has a difficult time with SL balance and strength while ascending the curb. Additionally patient leans forward too much causing him to be off balance. Patient required verbal reminders for cane gait sequence Patient negotiated 12* ramp with cane & minA when ascending the ramp and with cane and modA/HHA when descending the  ramp. Patient leans forward when descending the ramp which bring his COM forward and throws him off balance. Required verbal cues to shorten steps and bringing weight fully on prosthesis when ascending the ramp Patient ambulated with cane and CGA while scanning in laterally and up/down direction.  Patient had a tendency to walk towards his left side when looking in the left direction.  TREATMENT:  DATE:  11/11/2023:  Prosthetic Care with Transfemoral Microprocessor Knee (MPK) prosthesis: Patient ambulated 53' with cane and CGA Patient practiced 180* turns in CW and CCW direction with cane and supervision. Practiced by sink for safety for support if needed. Done by walking along sink and then turning 180*, walking along side and turning 180* to go back. Initially had one LOB but did well after a few repetitions.  4 square stepping with cane and minA for all directions except stepping backwards with Lt LE which required modA. Done in CW and CCW direction.  Patient negotiated 6.5 curb with cane and CGA. Patient had difficulty with initial step up to the turn and needed additional HHA. After the first step up, he only need cane and CGA. Patient needed verbal reminders for cane gait sequence for step up and down.  Patient negotiated 12*ramp with cane and CGA.  Patient had difficulty with cane gait pattern and was hesitant but was successful after a couple tries.   PATIENT EDUCATION: PATIENT EDUCATED ON FOLLOWING PROSTHETIC CARE: Education details:   Proper doffing with proper rotation Person educated: Patient and Spouse Education method: Explanation, Demonstration, Tactile cues, and Verbal cues Education comprehension: verbalized understanding and needs further education  HOME EXERCISE PROGRAM: Access Code: [email or web address] URL: [email or web address] Date:  09/30/2023 Prepared by: Grayce Spatz  Exercises - Standing Hip Abduction with Theraband Resistance  - 1 x daily - 7 x weekly - 1 sets - 10 reps - Standing Hip Extension with Resistance  - 1 x daily - 7 x weekly - 1 sets - 10 reps - Standing Hip Adduction with Resistance (Mirrored)  - 1 x daily - 7 x weekly - 1 sets - 10 reps - Standing Hip Flexion with Resistance (Mirrored)  - 1 x daily - 7 x weekly - 1 sets - 10 reps - Standing posture with back to counter  - 2 x daily - 7 x weekly - 1 sets - 1 reps - 5-10 minutes hold - Upright Stance at Door Frame Single Arm  - 3-6 x daily - 7 x weekly - 1 sets - 2 reps - 2 deep breathes hold - Upright Stance at Door Frame with Both Arms  - 3-6 x daily - 7 x weekly - 1 sets - 2 reps - 2 deep breathes hold - Modified Thomas Stretch  - 2 x daily - 7 x weekly - 1 sets - 2-3 reps - 30 seconds hold   Try to do these exercises 3-5 times per day.   In front of chair with walker in front for safety: Stand up & sit down 5-10 reps without touching walker unless needed for balance. 5-10 reps Ball up piece of paper.  Lower yourself like sitting down to put paper on floor or pick it up.  Raise back upright similar to standing up.  Pick up 5# object using above technique. 5-10 reps  Near counter top or rail on deck / porch.  Walk forward with left hand near counter looking forward, not staring at floor. 5-10 reps Walk backwards with cane and left hand near counter. 5-10 reps Walk sideways with cane and left hand near counter.  Push off your outside leg.  5-10 reps Use above side stepping to work on turning 90*  push off outside leg and turn other leg to face forward to walk down counter.  When turning to your left you will need a chair back across from counter to touch.    Place 4  step in front of sink or rail and chair back across from it Step up on 4 step with right leg and place prosthetic toes over the edge of step. As soon as prosthetic knee starts to bend  step down with Right leg.  Turn around towards counter.  5-10 times   At counter:  Place a chair at end of counter so left hand will be on counter when you stand up. Work on standing up and walking with less hesitancy upon arising. Turn around towards counter at each end.  On return trip your table should now be on your left side to touch as needed.  Work on this 5-10 times.  Side step with cane & left hand on counter.  Going to right, push off left side when stepping.  Going to left, push off right side.  Move chair so back is across from sink:  using cane turn from facing sink going to your left until you are facing the chair back, turn around to face sink going to your right Place 2 bands on floor between chair & sink with enough room between the 2 bands for left foot can fit.   (You can put 2 pieces of tape on floor instead of bands if you want). Turn so your left side is to sink & Hold cane in right hand place prosthetic foot between the 2 lines: step RIGHT foot so toe steps on band or tape behind the prosthetic foot then step so heel touches band or tape in front of left foot.   Turn the chair around to face the sink:  face the chair - bend forward gently placing hands in chair bottom then come back upright.  Do 5-10 times.   Stand facing the sink & open up the lower cabinet doors:  hold cane in right hand and left hand on sink (as you improve try to hoover left hand over sink). Try to touch right toes to lower shelf of cabinet with control 5-10 reps.    ASSESSMENT:  CLINICAL IMPRESSION:  Patient overall did well today. Patient's balance progressed as shown by the increase of his BERG score to 41/56 which is 9 point change indicating lower fall risk. He no longer is at high risk for falls but instead at significant risk of falls. Additionally he showed overall improvement with a score of 9 with the Patient Specific Activity Scoring Scale, which shows an improvement in his functional abilities.  Patient is safe to ambulate in the community with his RW and use the cane in controlled environments where external support is available. Gait velocity time with RW indicates significant improvement in functional mobility.  Patient understands that he needs to continue to exercise and work on balance to maintain the level of function he has.   OBJECTIVE IMPAIRMENTS: Abnormal gait, decreased activity tolerance, decreased balance, decreased endurance, decreased knowledge of condition, decreased knowledge of use of DME, decreased mobility, difficulty walking, decreased ROM, decreased strength, and prosthetic dependency .   ACTIVITY LIMITATIONS: carrying, lifting, standing, stairs, transfers, and locomotion level  PARTICIPATION LIMITATIONS: meal prep, community activity, church, and household mobility  PERSONAL FACTORS: Age, Fitness, Past/current experiences, Time since onset of injury/illness/exacerbation, and 3+ comorbidities: see PMH are also affecting patient's functional outcome.   REHAB POTENTIAL: Good  CLINICAL DECISION MAKING: Evolving/moderate complexity  EVALUATION COMPLEXITY: Moderate   GOALS: Goals reviewed with patient? Yes  SHORT TERM GOALS: Target date: 06/27/2023  Patient reports able to don prosthesis with proper rotation  modified independent. Baseline: SEE OBJECTIVE DATA Goal status: MET 06/25/2023 2.  Patient able to sit to/from stand engaging hydraulics of prosthetic knee from 18 chair without armrests using BUEs.  Baseline: SEE OBJECTIVE DATA Goal status: MET 06/25/2023  3.  Patient able to pick up object from floor without UE support using prosthetic hydraulics with supervision. Baseline: SEE OBJECTIVE DATA Goal status: MET  06/25/2023  4. Patient ambulates 100' with cane & prosthesis with minA. Baseline: SEE OBJECTIVE DATA Goal status: MET  06/25/2023   LONG TERM GOALS: Target date: 11/25/2023  Patient demonstrates & verbalized understanding of prosthetic care to  enable safe utilization of prosthesis. Baseline: SEE OBJECTIVE DATA Goal status: MET, 11/26/2023  Patient-Specific Activity Scoring >/= 4/10 Baseline: SEE OBJECTIVE DATA Goal status: MET, 11/26/2023  Berg Balance >/= 40/56 (increase by >8 points) to indicate lower fall risk Baseline: SEE OBJECTIVE DATA Goal status:  MET, 11/26/2023  Patient ambulates 100' with prosthesis and cane independently Baseline: SEE OBJECTIVE DATA Goal status: Partially MET, 11/26/2023  Patient negotiates ramps & curbs with LRAD & prosthesis modified independent.  Baseline: SEE OBJECTIVE DATA Goal status: NOT MET, 11/26/2023  Patient negotiates stairs with single rail & prosthesis modified independent.  Baseline: SEE OBJECTIVE DATA Goal status: MET, 11/26/2023   PLAN:  PT FREQUENCY: 1x/week  PT DURATION: 90 days  PLANNED INTERVENTIONS: 97164- PT Re-evaluation, 97110-Therapeutic exercises, 97530- Therapeutic activity, 97112- Neuromuscular re-education, [phone]- Self Care, [id number]- Gait training, [phone]- Prosthetic training, Patient/Family education, Balance training, and Stair training  PLAN FOR NEXT SESSION: Discharge PT   Ismael Nap, Student-PT 11/26/2023, 1:26 PM  This entire session of physical therapy was performed under the direct supervision of PT signing evaluation /treatment. PT reviewed note and agrees.   Grayce Spatz, PT, DPT 11/26/2023, 2:26 PM

## 2023-12-09 ENCOUNTER — Encounter: Payer: Self-pay | Admitting: Internal Medicine

## 2023-12-25 ENCOUNTER — Ambulatory Visit
Admission: RE | Admit: 2023-12-25 | Discharge: 2023-12-25 | Disposition: A | Discharge status: Home / Self Care | Attending: Internal Medicine | Admitting: Internal Medicine

## 2023-12-25 ENCOUNTER — Encounter
Admission: RE | Disposition: A | Discharge status: Home / Self Care | Payer: Self-pay | Source: Home / Self Care | Attending: Internal Medicine

## 2023-12-25 ENCOUNTER — Other Ambulatory Visit: Payer: Self-pay

## 2023-12-25 ENCOUNTER — Ambulatory Visit: Admitting: Certified Registered"

## 2023-12-25 ENCOUNTER — Encounter: Payer: Self-pay | Admitting: Internal Medicine

## 2023-12-25 DIAGNOSIS — I2510 Atherosclerotic heart disease of native coronary artery without angina pectoris: Secondary | ICD-10-CM | POA: Insufficient documentation

## 2023-12-25 DIAGNOSIS — K3189 Other diseases of stomach and duodenum: Secondary | ICD-10-CM | POA: Diagnosis not present

## 2023-12-25 DIAGNOSIS — J449 Chronic obstructive pulmonary disease, unspecified: Secondary | ICD-10-CM | POA: Insufficient documentation

## 2023-12-25 DIAGNOSIS — Z79899 Other long term (current) drug therapy: Secondary | ICD-10-CM | POA: Diagnosis not present

## 2023-12-25 DIAGNOSIS — K766 Portal hypertension: Secondary | ICD-10-CM | POA: Insufficient documentation

## 2023-12-25 DIAGNOSIS — I10 Essential (primary) hypertension: Secondary | ICD-10-CM | POA: Diagnosis not present

## 2023-12-25 DIAGNOSIS — I739 Peripheral vascular disease, unspecified: Secondary | ICD-10-CM | POA: Insufficient documentation

## 2023-12-25 DIAGNOSIS — Z87442 Personal history of urinary calculi: Secondary | ICD-10-CM | POA: Insufficient documentation

## 2023-12-25 DIAGNOSIS — L539 Erythematous condition, unspecified: Secondary | ICD-10-CM | POA: Diagnosis not present

## 2023-12-25 DIAGNOSIS — I714 Abdominal aortic aneurysm, without rupture, unspecified: Secondary | ICD-10-CM | POA: Diagnosis not present

## 2023-12-25 DIAGNOSIS — K219 Gastro-esophageal reflux disease without esophagitis: Secondary | ICD-10-CM | POA: Diagnosis not present

## 2023-12-25 DIAGNOSIS — Z7982 Long term (current) use of aspirin: Secondary | ICD-10-CM | POA: Diagnosis not present

## 2023-12-25 DIAGNOSIS — F17200 Nicotine dependence, unspecified, uncomplicated: Secondary | ICD-10-CM | POA: Diagnosis not present

## 2023-12-25 DIAGNOSIS — K7030 Alcoholic cirrhosis of liver without ascites: Secondary | ICD-10-CM | POA: Diagnosis not present

## 2023-12-25 HISTORY — PX: ESOPHAGOGASTRODUODENOSCOPY: SHX5428

## 2023-12-25 SURGERY — EGD (ESOPHAGOGASTRODUODENOSCOPY)
Anesthesia: General

## 2023-12-25 MED ORDER — SODIUM CHLORIDE 0.9 % IV SOLN: INTRAVENOUS | Status: DC

## 2023-12-25 MED ORDER — PROPOFOL 1000 MG/100ML IV EMUL
INTRAVENOUS | Status: AC
  Filled 2023-12-25: qty 100

## 2023-12-25 MED ORDER — LIDOCAINE HCL (PF) 2 % IJ SOLN
INTRAMUSCULAR | Status: DC | PRN
  Administered 2023-12-25 (×2): 100 mg via INTRADERMAL

## 2023-12-25 MED ORDER — PROPOFOL 10 MG/ML IV BOLUS
INTRAVENOUS | Status: DC | PRN
  Administered 2023-12-25: 50 mg via INTRAVENOUS

## 2023-12-25 NOTE — Transfer of Care (Signed)
 Immediate Anesthesia Transfer of Care Note  Patient: Beryl LITTIE Lowers  Procedure(s) Performed: EGD (ESOPHAGOGASTRODUODENOSCOPY)  Patient Location: PACU  Anesthesia Type:General  Level of Consciousness: awake and patient cooperative  Airway & Oxygen Therapy: Patient Spontanous Breathing  Post-op Assessment: Report given to RN and Post -op Vital signs reviewed and stable  Post vital signs: stable  Last Vitals:  Vitals Value Taken Time  BP    Temp 35.8 C 12/25/23 08:50  Pulse 73 12/25/23 08:51  Resp 12 12/25/23 08:51  SpO2 98 % 12/25/23 08:51  Vitals shown include unfiled device data.  Last Pain:  Vitals:   12/25/23 0850  TempSrc: Temporal  PainSc: 0-No pain         Complications: No notable events documented.

## 2023-12-25 NOTE — Anesthesia Preprocedure Evaluation (Signed)
 Anesthesia Evaluation  Patient identified by MRN, date of birth, ID band Patient awake    Reviewed: Allergy & Precautions, H&P , NPO status , Patient's Chart, lab work & pertinent test results, reviewed documented beta blocker date and time   Airway Mallampati: II   Neck ROM: full    Dental  (+) Poor Dentition   Pulmonary pneumonia, resolved, COPD, Current Smoker   Pulmonary exam normal        Cardiovascular Exercise Tolerance: Poor hypertension, On Medications + CAD and + Peripheral Vascular Disease  Normal cardiovascular exam Rhythm:regular Rate:Normal     Neuro/Psych  PSYCHIATRIC DISORDERS      negative neurological ROS     GI/Hepatic Neg liver ROS,GERD  Medicated,,  Endo/Other  negative endocrine ROS    Renal/GU Renal disease  negative genitourinary   Musculoskeletal   Abdominal   Peds  Hematology negative hematology ROS (+)   Anesthesia Other Findings Past Medical History: No date: Alcohol abuse No date: Aortic atherosclerosis No date: B12 deficiency No date: Cirrhosis (HCC) No date: COPD (chronic obstructive pulmonary disease) (HCC) No date: Coronary artery disease No date: GERD (gastroesophageal reflux disease) No date: Glaucoma No date: Grade II diastolic dysfunction No date: History of kidney stones No date: HLD (hyperlipidemia) No date: Hx of radiation therapy No date: Hypertension No date: Lung mass No date: Moderate mitral regurgitation No date: Pneumonia No date: PVD (peripheral vascular disease) 2019: Squamous cell carcinoma of lung, right (HCC) Past Surgical History: No date: COLONOSCOPY WITH PROPOFOL  11/25/2018: COLONOSCOPY WITH PROPOFOL ; N/A     Comment:  Procedure: COLONOSCOPY WITH PROPOFOL ;  Surgeon:               Gaylyn Gladis PENNER, MD;  Location: [facility] ENDOSCOPY;                Service: Endoscopy;  Laterality: N/A; 11/29/2021: COLONOSCOPY WITH PROPOFOL ; N/A     Comment:   Procedure: COLONOSCOPY WITH PROPOFOL ;  Surgeon: Toledo,               Ladell POUR, MD;  Location: [facility] ENDOSCOPY;  Service:               Gastroenterology;  Laterality: N/A; 07/13/2020: ENDARTERECTOMY FEMORAL; Left     Comment:  Procedure: ENDARTERECTOMY FEMORAL ( SFA STENT);                Surgeon: Jama Cordella MATSU, MD;  Location: [facility] ORS;                Service: Vascular;  Laterality: Left; 11/25/2018: ESOPHAGOGASTRODUODENOSCOPY (EGD) WITH PROPOFOL ; N/A     Comment:  Procedure: ESOPHAGOGASTRODUODENOSCOPY (EGD) WITH               PROPOFOL ;  Surgeon: Gaylyn Gladis PENNER, MD;  Location:               [facility] ENDOSCOPY;  Service: Endoscopy;  Laterality: N/A; 11/29/2021: ESOPHAGOGASTRODUODENOSCOPY (EGD) WITH PROPOFOL ; N/A     Comment:  Procedure: ESOPHAGOGASTRODUODENOSCOPY (EGD) WITH               PROPOFOL ;  Surgeon: Toledo, Ladell POUR, MD;  Location:               [facility] ENDOSCOPY;  Service: Gastroenterology;  Laterality:               N/A; 12/08/2021: ESOPHAGOGASTRODUODENOSCOPY (EGD) WITH PROPOFOL ; N/A     Comment:  Procedure: ESOPHAGOGASTRODUODENOSCOPY (EGD) WITH  PROPOFOL ;  Surgeon: Therisa Bi, MD;  Location: [facility]               ENDOSCOPY;  Service: Gastroenterology;  Laterality: N/A; No date: LEG AMPUTATION ABOVE KNEE; Left 06/07/2020: LOWER EXTREMITY ANGIOGRAPHY; Left     Comment:  Procedure: LOWER EXTREMITY ANGIOGRAPHY;  Surgeon:               Jama Cordella MATSU, MD;  Location: [facility] INVASIVE CV LAB;               Service: Cardiovascular;  Laterality: Left; 01/10/2021: LOWER EXTREMITY ANGIOGRAPHY; Left     Comment:  Procedure: LOWER EXTREMITY ANGIOGRAPHY;  Surgeon:               Jama Cordella MATSU, MD;  Location: [facility] INVASIVE CV LAB;               Service: Cardiovascular;  Laterality: Left; BMI    Body Mass Index: 26.66 kg/m     Reproductive/Obstetrics negative OB ROS                              Anesthesia Physical Anesthesia Plan  ASA:  4  Anesthesia Plan: General   Post-op Pain Management:    Induction:   PONV Risk Score and Plan:   Airway Management Planned:   Additional Equipment:   Intra-op Plan:   Post-operative Plan:   Informed Consent: I have reviewed the patients History and Physical, chart, labs and discussed the procedure including the risks, benefits and alternatives for the proposed anesthesia with the patient or authorized representative who has indicated his/her understanding and acceptance.     Dental Advisory Given  Plan Discussed with: CRNA  Anesthesia Plan Comments:         Anesthesia Quick Evaluation

## 2023-12-25 NOTE — Op Note (Addendum)
 [facility]astro[facility] Patient Name: Granite Godman Procedure Date: 12/25/2023 8:25 AM MRN: [id number] Account #: [id number] Date of Birth: 05-01-1945 Admit Type: Outpatient Age: 78 Room: [facility] ENDO ROOM 2 Gender: Male Note Status: Supervisor Override Instrument Name: Barnie GI Scope [id number] [id number] Procedure:             Upper GI endoscopy Indications:           Iron deficiency anemia secondary to chronic blood                         loss, Alcoholic cirrhosis, Portal venous hypertension Providers:             Margarine Grosshans K. Aundria MD, MD Referring MD:          Alm HERO. Glover, MD (Referring MD) Medicines:             Propofol  per Anesthesia Complications:         No immediate complications. Estimated blood loss:                         Minimal. Procedure:             Pre-Anesthesia Assessment:                        - The risks and benefits of the procedure and the                         sedation options and risks were discussed with the                         patient. All questions were answered and informed                         consent was obtained.                        - Patient identification and proposed procedure were                         verified prior to the procedure by the nurse. The                         procedure was verified in the procedure room.                        - ASA Grade Assessment: III - A patient with severe                         systemic disease.                        - After reviewing the risks and benefits, the patient                         was deemed in satisfactory condition to undergo the                         procedure.  After obtaining informed consent, the endoscope was                         passed under direct vision. Throughout the procedure,                         the patient's blood pressure, pulse, and oxygen                         saturations were monitored continuously. The  Endoscope                         was introduced through the mouth, and advanced to the                         third part of duodenum. The upper GI endoscopy was                         accomplished without difficulty. The patient tolerated                         the procedure well. Findings:      The esophagus was normal.      Moderate portal hypertensive gastropathy was found in the entire       examined stomach. Biopsies were taken with a cold forceps for histology.       Four biopsies were obtained with cold forceps for histology in the       gastric body. Estimated blood loss was minimal.      The exam of the stomach was otherwise normal.      Patchy mildly erythematous mucosa without active bleeding and with no       stigmata of bleeding was found in the duodenal bulb.      The second portion of the duodenum and third portion of the duodenum       were normal. Estimated blood loss: none.      There was no evidence of esophageal or gastric varices on this       examination. Impression:            - Normal esophagus.                        - Portal hypertensive gastropathy. Biopsied.                        - Erythematous duodenopathy.                        - Normal second portion of the duodenum and third                         portion of the duodenum.                        - Biopsies performed in the gastric body. Recommendation:        - Patient has a contact number available for                         emergencies. The signs and symptoms of potential  delayed complications were discussed with the patient.                         Return to normal activities tomorrow. Written                         discharge instructions were provided to the patient.                        - Resume previous diet.                        - Continue present medications.                        - No aspirin , ibuprofen, naproxen, or other                          non-steroidal anti-inflammatory drugs.                        - Await pathology results.                        - Repeat upper endoscopy in 1 year for surveillance                         based on pathology results.                        - Return to GI office as previously scheduled.                        - The findings and recommendations were discussed with                         the patient. Procedure Code(s):     --- Professional ---                        [phone], Esophagogastroduodenoscopy, flexible,                         transoral; with biopsy, single or multiple Diagnosis Code(s):     --- Professional ---                        D50.0, Iron deficiency anemia secondary to blood loss                         (chronic)                        K31.89, Other diseases of stomach and duodenum                        K76.6, Portal hypertension CPT copyright 2022 American Medical Association. All rights reserved. The codes documented in this report are preliminary and upon coder review may  be revised to meet current compliance requirements. Ladell MARLA Boss MD, MD 12/25/2023 8:53:01 AM This report has been signed electronically. Number of Addenda: 0 Note Initiated On: 12/25/2023 8:25 AM Estimated Blood Loss:  Estimated  blood loss was minimal. Estimated blood                         loss: none.      [facility]

## 2023-12-25 NOTE — H&P (Signed)
 Outpatient short stay form Pre-procedure 12/25/2023 8:27 AM Samuel Carney K. Aundria, M.D.  Primary Physician: Alm Na, M.D.  Reason for visit:  Portal venous hypertension, cirrhosis  History of present illness:   Labs: 01/29/2019- GAME (elevated IgG 1679, IgE 1848), AST 13, ALT 6, alk phos 81, tbili 0.6, Plt 102K, INR 0.9, ANA neg, ASMA weak positive 29, AMA equivocal 21.4, A1A 103 - CSY: 11/25/2018 - poor prep, left-sided diverticulosis, one 3 mm TA in transverse colon, one 10 mm TA in distal transverse colon, rectal varices, non-bleeding IH - EGD: 11/25/2018 - irregular Z-line with esophageal bx consistent with Barrett's esophagus, H pylori negative gastritis, few bleeding angioectasias in stomach treated with APC, possible gastric varices, erosive duodenitis with biopsies showing peptic duodenitis - EGD: 11/29/2021 - mild distal Schatzki ring, no visual evidence of BE with white light, mild portal hypertensive gastropathy, five small angioectasias with two lesions in body had stigmata of recent oozing, three were non-bleeding in body and antrum s/p APC, normal duodenum  - CSY: 11/29/2021 - small non-bleeding rectal varices, sigmoid diverticulosis, otherwise normal examined colon - EGD: 12/08/2021 - normal esophagus, five non-bleeding gastric AVMs s/p APC, three non-bleeding AVMs in duodenum s/p APC, otherwise normal   GI Medications: Current: Pantoprazole  40 mg PO daily, ferrous sulfate 325 mg PO daily Prior: Carafate 1 g   Interval History   Samuel Carney presents to the [facility] for 50-month follow-up of alcoholic cirrhosis of the liver without ascites. He denies any acute GI complaints or concerns at this time. No new liver-related symptoms such as abdominal swelling, lower extremity edema, abdominal pain, hematochezia, melena, jaundice, pruritus, or altered mental status. He has had no recent ED visits or hospitalizations. He received his Vit B12 injection before our appointment this  morning. He reports several members of his family have had new health issues come up and he has been drinking more. He reports he is having 3 alcoholic beverages in the afternoons - usually bourbon and water as a chaser. Weight is up 15-lbs over the past 50-months. No other questions or concerns at this time.   No current facility-administered medications for this encounter.  Medications Prior to Admission  Medication Sig Dispense Refill Last Dose/Taking   acetaminophen  (TYLENOL ) 500 MG tablet Take 1,000 mg by mouth every 6 (six) hours as needed for moderate pain or mild pain.      amLODipine  (NORVASC ) 10 MG tablet Take 1 tablet (10 mg total) by mouth daily. TAKE 1 TABLET BY MOUTH ONCE DAILY . 90 tablet 3    aspirin  EC 81 MG EC tablet Take 1 tablet (81 mg total) by mouth daily at 6 (six) AM. Swallow whole. 90 tablet 3    dorzolamide -timolol  (COSOPT ) 22.3-6.8 MG/ML ophthalmic solution Place 1 drop into both eyes 2 (two) times daily.  6    doxazosin  (CARDURA ) 2 MG tablet Take 1 tablet (2 mg total) by mouth daily. TAKE 1 TABLET BY MOUTH ONCE DAILY . 90 tablet 3    FEROSUL 325 (65 Fe) MG tablet Take 325 mg by mouth every morning.      ferrous fumarate-b12-vitamic C-folic acid (TRINSICON / FOLTRIN) capsule Take 1 capsule by mouth 2 (two) times daily after a meal. 60 capsule 2    gabapentin  (NEURONTIN ) 300 MG capsule Take 1 capsule (300 mg total) by mouth at bedtime. 30 capsule 3    metoprolol  tartrate (LOPRESSOR ) 100 MG tablet Take 1 tablet (100 mg total) by mouth 2 (two) times daily. Please hold  metoprolol  until you see your doctor 180 tablet 3    Misc. Devices (ROLLATOR ULTRA-LIGHT) MISC Use 1 each once daily Rollator      pantoprazole  (PROTONIX ) 40 MG tablet Take 1 tablet (40 mg total) by mouth 2 (two) times daily. 60 tablet 1    polyethylene glycol (MIRALAX  / GLYCOLAX ) 17 g packet Take 17 g by mouth daily. 14 each 0    rosuvastatin  (CRESTOR ) 5 MG tablet Take 1 tablet (5 mg total) by mouth daily. TAKE  1 TABLET BY MOUTH ONCE DAILY. 90 tablet 3    telmisartan  (MICARDIS ) 80 MG tablet Take 1 tablet (80 mg total) by mouth daily. TAKE 1 TABLET BY MOUTH ONCE DAILY . 90 tablet 3    Travoprost, BAK Free, (TRAVATAN) 0.004 % SOLN ophthalmic solution Place 1 drop into both eyes at bedtime.      varenicline (CHANTIX) 1 MG tablet Take 1 mg by mouth daily.        No Known Allergies   Past Medical History:  Diagnosis Date   Alcohol abuse    Aortic atherosclerosis    B12 deficiency    Cirrhosis (HCC)    COPD (chronic obstructive pulmonary disease) (HCC)    Coronary artery disease    GERD (gastroesophageal reflux disease)    Glaucoma    Grade II diastolic dysfunction    History of kidney stones    HLD (hyperlipidemia)    Hx of radiation therapy    Hypertension    Lung mass    Moderate mitral regurgitation    Pneumonia    PVD (peripheral vascular disease)    Squamous cell carcinoma of lung, right (HCC) 2019    Review of systems:  Otherwise negative.    Physical Exam  Gen: Alert, oriented. Appears stated age.  HEENT: /AT. PERRLA. Lungs: CTA, no wheezes. CV: RR nl S1, S2. Abd: soft, benign, no masses. BS+ Ext: No edema. Pulses 2+  IMPRESSION:  Cirrhotic appearing liver.   Two complex cystic lesions within the liver, measuring 19 mm and 2.2  cm in greatest sizes, grossly unchanged.   Medical renal disease changes of both kidneys.   4.0 x 2.9 cm diameter distal abdominal aortic aneurysm.   Gallbladder not visualized, likely contracted.   Nonobstructing 10 mm calculus inferior pole RIGHT kidney.    Planned procedures: Proceed with EGD. The patient understands the nature of the planned procedure, indications, risks, alternatives and potential complications including but not limited to bleeding, infection, perforation, damage to internal organs and possible oversedation/side effects from anesthesia. The patient agrees and gives consent to proceed.  Please refer to procedure  notes for findings, recommendations and patient disposition/instructions.     [name], M.D. Gastroenterology 12/25/2023  8:27 AM

## 2023-12-25 NOTE — Interval H&P Note (Signed)
 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  The various methods of treatment have been discussed with the patient and family. After consideration of risks, benefits and other options for treatment, the patient has consented to  Procedure(s): EGD (ESOPHAGOGASTRODUODENOSCOPY) (N/A) as a surgical intervention.  The patient's history has been reviewed, patient examined, no change in status, stable for surgery.  I have reviewed the patient's chart and labs.  Questions were answered to the patient's satisfaction.     [place], Kylin Genna

## 2023-12-25 NOTE — Anesthesia Postprocedure Evaluation (Signed)
 Anesthesia Post Note  Patient: Samuel Carney  Procedure(s) Performed: EGD (ESOPHAGOGASTRODUODENOSCOPY)  Patient location during evaluation: PACU Anesthesia Type: General Level of consciousness: awake and alert Pain management: pain level controlled Vital Signs Assessment: post-procedure vital signs reviewed and stable Respiratory status: spontaneous breathing, nonlabored ventilation, respiratory function stable and patient connected to nasal cannula oxygen Cardiovascular status: blood pressure returned to baseline and stable Postop Assessment: no apparent nausea or vomiting Anesthetic complications: no   There were no known notable events for this encounter.   Last Vitals:  Vitals:   12/25/23 0821 12/25/23 0850  BP: (!) 158/80 100/80  Pulse: 80 67  Resp: 18 (!) 24  Temp: (!) 35.9 C (!) 35.8 C  SpO2: 93% 99%    Last Pain:  Vitals:   12/25/23 0850  TempSrc: Temporal  PainSc: 0-No pain                 Lynwood KANDICE Clause

## 2023-12-26 LAB — SURGICAL PATHOLOGY

## 2024-01-18 ENCOUNTER — Other Ambulatory Visit: Payer: Self-pay | Admitting: Cardiovascular Disease

## 2024-01-21 NOTE — Telephone Encounter (Signed)
 Please contact pt for future appointment. Pt due for follow up.

## 2024-01-22 ENCOUNTER — Encounter: Payer: Self-pay | Admitting: Medical

## 2024-01-22 ENCOUNTER — Ambulatory Visit: Attending: Medical | Admitting: Medical

## 2024-01-22 VITALS — BP 140/78 | HR 71 | Ht 67.5 in | Wt 173.2 lb

## 2024-01-22 DIAGNOSIS — Z89612 Acquired absence of left leg above knee: Secondary | ICD-10-CM

## 2024-01-22 DIAGNOSIS — I25118 Atherosclerotic heart disease of native coronary artery with other forms of angina pectoris: Secondary | ICD-10-CM

## 2024-01-22 DIAGNOSIS — E782 Mixed hyperlipidemia: Secondary | ICD-10-CM

## 2024-01-22 DIAGNOSIS — Z720 Tobacco use: Secondary | ICD-10-CM | POA: Diagnosis not present

## 2024-01-22 DIAGNOSIS — I10 Essential (primary) hypertension: Secondary | ICD-10-CM | POA: Diagnosis not present

## 2024-01-22 DIAGNOSIS — F1010 Alcohol abuse, uncomplicated: Secondary | ICD-10-CM

## 2024-01-22 MED ORDER — DOXAZOSIN MESYLATE 2 MG PO TABS: 2.0000 mg | ORAL_TABLET | Freq: Every day | ORAL | 3 refills | Status: AC

## 2024-01-22 MED ORDER — ROSUVASTATIN CALCIUM 5 MG PO TABS: 5.0000 mg | ORAL_TABLET | Freq: Every day | ORAL | 3 refills | Status: AC

## 2024-01-22 MED ORDER — AMLODIPINE BESYLATE 10 MG PO TABS: 10.0000 mg | ORAL_TABLET | Freq: Every day | ORAL | 3 refills | Status: AC

## 2024-01-22 MED ORDER — TELMISARTAN 80 MG PO TABS: 80.0000 mg | ORAL_TABLET | Freq: Every day | ORAL | 3 refills | Status: AC

## 2024-01-22 NOTE — Patient Instructions (Signed)
 Medication Instructions:  Your physician recommends that you continue on your current medications as directed. Please refer to the Current Medication list given to you today.   *If you need a refill on your cardiac medications before your next appointment, please call your pharmacy*  Lab Work: Your provider would like for you to have following labs drawn today lipid.   If you have labs (blood work) drawn today and your tests are completely normal, you will receive your results only by: MyChart Message (if you have MyChart) OR A paper copy in the mail If you have any lab test that is abnormal or we need to change your treatment, we will call you to review the results.  Testing/Procedures: Your physician has requested that you have an Aorta/Iliac Duplex. This will take place at [address]  No food after 11PM the night before.  Water is OK. (Don't drink liquids if you have been instructed not to for ANOTHER test) Avoid foods that produce bowel gas, for 24 hours prior to exam (see below). No breakfast, no chewing gum, no smoking or carbonated beverages. Patient may take morning medications with water. Come in for test at least 15 minutes early to register.  Please note: We ask at that you not bring children with you during ultrasound (echo/ vascular) testing. Due to room size and safety concerns, children are not allowed in the ultrasound rooms during exams. Our front office staff cannot provide observation of children in our lobby area while testing is being conducted. An adult accompanying a patient to their appointment will only be allowed in the ultrasound room at the discretion of the ultrasound technician under special circumstances. We apologize for any inconvenience.  Your physician has requested that you have an ankle brachial index (ABI). During this test an ultrasound and blood pressure cuff are used to evaluate the arteries that supply the arms and legs with blood.   Allow thirty minutes for this exam.  There are no restrictions or special instructions.  This will take place at [address] [facility] [address]) [address], [place] [address]  Please note: We ask at that you not bring children with you during ultrasound (echo/ vascular) testing. Due to room size and safety concerns, children are not allowed in the ultrasound rooms during exams. Our front office staff cannot provide observation of children in our lobby area while testing is being conducted. An adult accompanying a patient to their appointment will only be allowed in the ultrasound room at the discretion of the ultrasound technician under special circumstances. We apologize for any inconvenience.  Your physician has requested that you have a lower extremity arterial duplex. During this test, ultrasound is used to evaluate arterial blood flow in the legs. Allow one hour for this exam. There are no restrictions or special instructions. This will take place at [address] [facility] [address]) [address], [place] [address]  Please note: We ask at that you not bring children with you during ultrasound (echo/ vascular) testing. Due to room size and safety concerns, children are not allowed in the ultrasound rooms during exams. Our front office staff cannot provide observation of children in our lobby area while testing is being conducted. An adult accompanying a patient to their appointment will only be allowed in the ultrasound room at the discretion of the ultrasound technician under special circumstances. We apologize for any inconvenience.   Follow-Up: At [facility], you and your health needs are our priority.  As part of our continuing mission to provide you with exceptional heart care, our providers are all part of one team.  This team includes your primary Cardiologist (physician) and Advanced Practice Providers or APPs (Physician Assistants and Nurse Practitioners) who all work  together to provide you with the care you need, when you need it.  Your next appointment:   12 month(s)  Provider:   You may see Timothy Gollan, MD or one of the following Advanced Practice Providers on your designated Care Team:   Cadence Franchester, [place]     We recommend signing up for the patient portal called MyChart.  Sign up information is provided on this After Visit Summary.  MyChart is used to connect with patients for Virtual Visits (Telemedicine).  Patients are able to view lab/test results, encounter notes, upcoming appointments, etc.  Non-urgent messages can be sent to your provider as well.   To learn more about what you can do with MyChart, go to [email or web address].

## 2024-01-22 NOTE — Progress Notes (Signed)
 Cardiology Office Note   Date:  01/22/2024  ID:  Escher, Harr [birth year], MRN [id number] PCP: Glover Lenis, MD  [facility] Providers Cardiologist:  Evalene Lunger, MD   History of Present Illness Samuel Carney is a 78 y.o. male with a hx of with history of right-sided squamous cell lung carcinoma diagnosed in 09/2017 s/p radiation therapy, coronary artery calcification noted on CT imaging, PAD, h/o tobacco use, COPD, alcoholic cirrhosis, B12 deficiency anemia, hypertension, Barrett's esophagus, GERD, thrombocytopenia, malignant 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, and who presents today for follow-up of coronary artery disease/coronary artery calcium .    He was initially evaluated 09/2017 for shortness of breath and incidentally noted coronary artery calcium  on CT imaging during work-up for lung cancer.  Images showed multivessel moderate to heavy coronary artery calcification.  He noted some shortness of breath with exertion at that time.  He underwent MPI 10/23/2017 without significant ischemia and overall ruled a low risk study.  Echo 03/19/2019 showed EF 60 to 65%, mild LVH, G2 DD, NR WMA, moderate LAE, moderate MR, mild TR, mild to moderate aortic sclerosis, and mild to moderately elevated PASP. 06/22/2020 MPI also ruled low risk study without significant ischemia.  Since that time, he has been evaluated and intervened on by vascular surgery for PAD.   He was seen 01/23/21 for pre-op clearance for his vascular surgery. Echo 05/2021 showed LVEF 60-65%, no WMA, moderate LVH, G2DD, normal EV function, mildly dilated left atrium, mild MR.   Patient was last seen 01/18/2023 and was overall stable from a cardiac perspective.  Ultrasound of the aorta, IVC, iliacs showed dilation of the mid abdominal aorta largest measurement 3.0 cm.  ABIs showed right ABI mild right lower extremity arterial disease, right TBI abnormal.  Ultrasound lower extremity arterial duplex on the right showed 30  to 49% stenosis in the common femoral artery and total occlusion in the popliteal artery. Recommended follow-up studies in 12 months.  Today, the patient reports he is overall doing well from a cardiac perspective. He denies chest pain, SOB, lower leg edema, lightheadedness, dizziness, palpitations, orthopnea, pnd. He uses a walker and wheelchair. He did PT and function is OK. He is not smoking. He is drinking 1/2 pint daily. He denies any leg pain.  Studies Reviewed EKG Interpretation Date/Time:  Wednesday January 22 2024 11:06:42 EDT Ventricular Rate:  71 PR Interval:  188 QRS Duration:  82 QT Interval:  348 QTC Calculation: 378 R Axis:   21  Text Interpretation: Normal sinus rhythm Nonspecific T wave abnormality When compared with ECG of 18-Jan-2023 08:53, Premature ventricular complexes are no longer Present Confirmed by Franchester, Hasten Sweitzer ([id number]) on 01/22/2024 11:20:18 AM    US  aorta/IVC/iliac 02/2023 Summary:  Abdominal Aorta: There is evidence of abnormal dilatation of the mid  Abdominal aorta. The largest aortic measurement is 3.0 cm. No previous  exam available for comparison.    *See table(s) above for measurements and observations.  Suggest follow up study in 12 months.   ABIs 2024 Right ABIs and TBIs appear decreased compared to prior study on 09/2020.    Summary:  Right: Resting right ankle-brachial index indicates mild right lower  extremity arterial disease. The right toe-brachial index is abnormal.   *See table(s) above for measurements and observations.    Suggest follow up study in 12 months.    US  lower extremity arterial duplex right side 2024 Summary:  Right: 30-49% stenosis noted in the common femoral artery. Total occlusion  noted in the popliteal artery.     See table(s) above for measurements and observations.   Suggest follow up study in 12 months.   Echo 05/2021  1. Left ventricular ejection fraction, by estimation, is 60 to 65%. The  left  ventricle has normal function. The left ventricle has no regional  wall motion abnormalities. There is moderate left ventricular hypertrophy.  Left ventricular diastolic  parameters are consistent with Grade II diastolic dysfunction  (pseudonormalization).   2. Right ventricular systolic function is normal. The right ventricular  size is normal. Tricuspid regurgitation signal is inadequate for assessing  PA pressure.   3. Left atrial size was mildly dilated.   4. The mitral valve is normal in structure. Mild mitral valve  regurgitation. No evidence of mitral stenosis.   5. The aortic valve is normal in structure. Aortic valve regurgitation is  not visualized. Aortic valve sclerosis/calcification is present, without  any evidence of aortic stenosis.   6. The inferior vena cava is normal in size with greater than 50%  respiratory variability, suggesting right atrial pressure of 3 mmHg.   Myoview  [facility]  2022 Narrative & Impression  The study is normal. This is a low risk study. The left ventricular ejection fraction is normal (68%). There is no evidence for ischemia   Echo 2020  1. Left ventricular ejection fraction, by visual estimation, is 60 to  65%. The left ventricle has normal function. There is mildly increased  left ventricular hypertrophy.   2. Left ventricular diastolic parameters are consistent with Grade II  diastolic dysfunction (pseudonormalization).   3. The left ventricle has no regional wall motion abnormalities.   4. Global right ventricle has normal systolic function.The right  ventricular size is normal. No increase in right ventricular wall  thickness.   5. Left atrial size was moderately dilated.   6. Moderate mitral valve regurgitation.   7. Tricuspid valve regurgitation is mild.   8. Mild to moderate aortic valve sclerosis/calcification without any  evidence of aortic stenosis.   9. Mild to Moderately elevated pulmonary artery systolic pressure.    Myoview  [facility]  2019  Narrative & Impression  Normal pharmacologic myocardial perfusion stress test without significant ischemia or scar. The left ventricular ejection fraction is hyperdynamic (>65%). This is a low risk study.      Physical Exam VS:  BP (!) 140/78 (BP Location: Left Arm, Patient Position: Sitting, Cuff Size: Normal)   Pulse 71   Ht 5' 7.5 (1.715 m)   Wt 173 lb 4 oz (78.6 kg)   SpO2 93%   BMI 26.73 kg/m        Wt Readings from Last 3 Encounters:  01/22/24 173 lb 4 oz (78.6 kg)  12/25/23 170 lb 3.2 oz (77.2 kg)  01/18/23 160 lb 9.6 oz (72.8 kg)    GEN: Well nourished, well developed in no acute distress NECK: No JVD; No carotid bruits CARDIAC: RRR, no murmurs, rubs, gallops RESPIRATORY:  Clear to auscultation without rales, wheezing or rhonchi  ABDOMEN: Soft, non-tender, non-distended EXTREMITIES:  No edema; No deformity   ASSESSMENT AND PLAN  CAD without angina Prior Chest CT showed 3 vessel calcification and diffuse aortic atherosclerosis. Myoview  March 2020 was a low risk study. He denies anginal symptoms. No further work-up at this time. Continue ASA 81mg  daily and Crestor  5mg  daily and metoprolol  100mg  BID.  Lung cancer s/p radiation  He was diagnosed with luncer in 2019 and underwent radiation. He is followed by oncology.   Tobacco  use He quit smoking about 2 years ago  Alcohol use He reports drinking 1/2 pint daily  HLD LDL 61. I will update a lipid profile today.   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 prior ABIs with moderately decreased perfusion right leg. US  aorta/IVC/ilian showed aortic dilation of 3cm. He denies claudication symptoms. I will repeat US  arota/IVC, ilian, ABIs and US   lower extremity arterial  duplex. Continue ASA and Crestor .      Dispo: Follow-up in 1 year  Signed, Karilynn Carranza VEAR Fishman, PA-C

## 2024-01-23 LAB — LIPID PANEL
Chol/HDL Ratio: 2.6 ratio (ref 0.0–5.0)
Cholesterol, Total: 129 mg/dL (ref 100–199)
HDL: 50 mg/dL (ref >39)
LDL Chol Calc (NIH): 59 mg/dL (ref 0–99)
Triglycerides: 111 mg/dL (ref 0–149)
VLDL Cholesterol Cal: 20 mg/dL (ref 5–40)

## 2024-01-24 ENCOUNTER — Ambulatory Visit: Payer: Self-pay | Admitting: Medical

## 2024-01-29 ENCOUNTER — Other Ambulatory Visit: Payer: Self-pay | Admitting: Gastroenterology

## 2024-01-29 DIAGNOSIS — K7030 Alcoholic cirrhosis of liver without ascites: Secondary | ICD-10-CM

## 2024-02-06 ENCOUNTER — Ambulatory Visit
Admission: RE | Admit: 2024-02-06 | Discharge: 2024-02-06 | Disposition: A | Discharge status: Home / Self Care | Source: Ambulatory Visit | Attending: Gastroenterology | Admitting: Gastroenterology

## 2024-02-06 DIAGNOSIS — K7030 Alcoholic cirrhosis of liver without ascites: Secondary | ICD-10-CM | POA: Diagnosis present

## 2024-02-24 ENCOUNTER — Ambulatory Visit: Attending: Medical

## 2024-02-24 ENCOUNTER — Ambulatory Visit

## 2024-02-24 ENCOUNTER — Ambulatory Visit (INDEPENDENT_AMBULATORY_CARE_PROVIDER_SITE_OTHER)

## 2024-02-24 DIAGNOSIS — Z89612 Acquired absence of left leg above knee: Secondary | ICD-10-CM | POA: Diagnosis not present

## 2024-02-26 LAB — VAS US ABI WITH/WO TBI: Right ABI: 0.82

## 2024-03-18 ENCOUNTER — Encounter: Payer: Self-pay | Admitting: Cardiovascular Disease
